# Patient Record
Sex: Female | Born: 1990 | Race: Black or African American | Hispanic: No | Marital: Single | Smoking: Former smoker
Health system: Southern US, Community
[De-identification: ages and names within clinical notes are randomized; demographics above are authoritative.]

## PROBLEM LIST (undated history)

## (undated) ENCOUNTER — Inpatient Hospital Stay (HOSPITAL_COMMUNITY): Payer: Self-pay

## (undated) DIAGNOSIS — N739 Female pelvic inflammatory disease, unspecified: Secondary | ICD-10-CM

## (undated) DIAGNOSIS — L309 Dermatitis, unspecified: Secondary | ICD-10-CM

## (undated) DIAGNOSIS — B009 Herpesviral infection, unspecified: Secondary | ICD-10-CM

## (undated) DIAGNOSIS — T7840XA Allergy, unspecified, initial encounter: Secondary | ICD-10-CM

## (undated) DIAGNOSIS — F32A Depression, unspecified: Secondary | ICD-10-CM

## (undated) DIAGNOSIS — D649 Anemia, unspecified: Secondary | ICD-10-CM

## (undated) DIAGNOSIS — Z8619 Personal history of other infectious and parasitic diseases: Secondary | ICD-10-CM

## (undated) DIAGNOSIS — F419 Anxiety disorder, unspecified: Secondary | ICD-10-CM

## (undated) DIAGNOSIS — F329 Major depressive disorder, single episode, unspecified: Secondary | ICD-10-CM

## (undated) HISTORY — DX: Herpesviral infection, unspecified: B00.9

## (undated) HISTORY — DX: Personal history of other infectious and parasitic diseases: Z86.19

## (undated) HISTORY — DX: Allergy, unspecified, initial encounter: T78.40XA

---
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
## 2011-01-16 ENCOUNTER — Ambulatory Visit (INDEPENDENT_AMBULATORY_CARE_PROVIDER_SITE_OTHER): Payer: Private Health Insurance - Indemnity | Admitting: Gynecology

## 2011-01-16 DIAGNOSIS — B373 Candidiasis of vulva and vagina: Secondary | ICD-10-CM

## 2011-01-16 DIAGNOSIS — R82998 Other abnormal findings in urine: Secondary | ICD-10-CM

## 2011-01-16 DIAGNOSIS — N898 Other specified noninflammatory disorders of vagina: Secondary | ICD-10-CM

## 2011-01-16 DIAGNOSIS — Z113 Encounter for screening for infections with a predominantly sexual mode of transmission: Secondary | ICD-10-CM

## 2011-01-16 DIAGNOSIS — B3731 Acute candidiasis of vulva and vagina: Secondary | ICD-10-CM

## 2011-02-18 ENCOUNTER — Encounter (INDEPENDENT_AMBULATORY_CARE_PROVIDER_SITE_OTHER): Payer: Private Health Insurance - Indemnity | Admitting: Gynecology

## 2011-02-18 ENCOUNTER — Encounter: Payer: Self-pay | Admitting: Gynecology

## 2011-02-18 DIAGNOSIS — B373 Candidiasis of vulva and vagina: Secondary | ICD-10-CM

## 2011-02-18 DIAGNOSIS — Z01419 Encounter for gynecological examination (general) (routine) without abnormal findings: Secondary | ICD-10-CM

## 2011-02-18 DIAGNOSIS — Z113 Encounter for screening for infections with a predominantly sexual mode of transmission: Secondary | ICD-10-CM

## 2011-02-18 DIAGNOSIS — B3731 Acute candidiasis of vulva and vagina: Secondary | ICD-10-CM

## 2011-02-27 LAB — DIFFERENTIAL
Basophils Absolute: 0.1 10*3/uL (ref 0.0–0.1)
Basophils Relative: 0 % (ref 0–1)
Eosinophils Absolute: 0.2 10*3/uL (ref 0.0–0.7)
Eosinophils Relative: 2 % (ref 0–5)
Lymphocytes Relative: 24 % (ref 12–46)
Lymphs Abs: 3.4 10*3/uL (ref 0.7–4.0)
Monocytes Absolute: 0.6 10*3/uL (ref 0.1–1.0)
Monocytes Relative: 4 % (ref 3–12)
Neutro Abs: 10.2 10*3/uL — ABNORMAL HIGH (ref 1.7–7.7)
Neutrophils Relative %: 70 % (ref 43–77)

## 2011-02-27 LAB — URINALYSIS, ROUTINE W REFLEX MICROSCOPIC
Bilirubin Urine: NEGATIVE
Hgb urine dipstick: NEGATIVE
Nitrite: NEGATIVE
Protein, ur: NEGATIVE mg/dL
Specific Gravity, Urine: 1.033 — ABNORMAL HIGH (ref 1.005–1.030)
pH: 8 (ref 5.0–8.0)

## 2011-02-27 LAB — COMPREHENSIVE METABOLIC PANEL
AST: 20 U/L (ref 0–37)
BUN: 10 mg/dL (ref 6–23)
CO2: 25 mEq/L (ref 19–32)
Calcium: 9.1 mg/dL (ref 8.4–10.5)
Chloride: 105 mEq/L (ref 96–112)
Creatinine, Ser: 0.74 mg/dL (ref 0.4–1.2)
GFR calc Af Amer: >60 mL/min (ref >60)
GFR calc non Af Amer: >60 mL/min (ref >60)
Glucose, Bld: 90 mg/dL (ref 70–99)
Total Bilirubin: 1 mg/dL (ref 0.3–1.2)

## 2011-02-27 LAB — URINE MICROSCOPIC-ADD ON

## 2011-02-27 LAB — CBC
HCT: 37.1 % (ref 36.0–46.0)
Hemoglobin: 12.6 g/dL (ref 12.0–15.0)
MCH: 30.7 pg (ref 26.0–34.0)
MCHC: 34 g/dL (ref 30.0–36.0)
MCV: 90.3 fL (ref 78.0–100.0)
Platelets: 294 10*3/uL (ref 150–400)
RBC: 4.11 MIL/uL (ref 3.87–5.11)
RDW: 13.1 % (ref 11.5–15.5)
WBC: 14.5 10*3/uL — ABNORMAL HIGH (ref 4.0–10.5)

## 2011-02-27 LAB — GC/CHLAMYDIA PROBE AMP, GENITAL
Chlamydia, DNA Probe: NEGATIVE
GC Probe Amp, Genital: POSITIVE — AB

## 2011-02-27 LAB — POCT PREGNANCY, URINE: Preg Test, Ur: NEGATIVE

## 2011-02-27 LAB — WET PREP, GENITAL: Clue Cells Wet Prep HPF POC: NONE SEEN

## 2011-02-27 LAB — LIPASE, BLOOD: Lipase: 27 U/L (ref 11–59)

## 2011-02-28 LAB — PREGNANCY, URINE: Preg Test, Ur: NEGATIVE

## 2011-03-15 ENCOUNTER — Ambulatory Visit (INDEPENDENT_AMBULATORY_CARE_PROVIDER_SITE_OTHER): Payer: Private Health Insurance - Indemnity | Admitting: Gynecology

## 2011-03-15 DIAGNOSIS — Z113 Encounter for screening for infections with a predominantly sexual mode of transmission: Secondary | ICD-10-CM

## 2011-03-22 LAB — URINE CULTURE: Colony Count: 1000

## 2011-03-22 LAB — WET PREP, GENITAL
Trich, Wet Prep: NONE SEEN
WBC, Wet Prep HPF POC: NONE SEEN
Yeast Wet Prep HPF POC: NONE SEEN

## 2011-03-22 LAB — POCT PREGNANCY, URINE: Preg Test, Ur: NEGATIVE

## 2011-03-22 LAB — URINALYSIS, ROUTINE W REFLEX MICROSCOPIC
Bilirubin Urine: NEGATIVE
Glucose, UA: NEGATIVE mg/dL
Hgb urine dipstick: NEGATIVE
Ketones, ur: NEGATIVE mg/dL
Nitrite: NEGATIVE
Protein, ur: NEGATIVE mg/dL
Specific Gravity, Urine: 1.019 (ref 1.005–1.030)
Urobilinogen, UA: 0.2 mg/dL (ref 0.0–1.0)
pH: 5 (ref 5.0–8.0)

## 2011-03-22 LAB — GC/CHLAMYDIA PROBE AMP, GENITAL
Chlamydia, DNA Probe: NEGATIVE
GC Probe Amp, Genital: NEGATIVE

## 2011-03-23 LAB — URINALYSIS, ROUTINE W REFLEX MICROSCOPIC
Glucose, UA: NEGATIVE mg/dL
Nitrite: NEGATIVE
Protein, ur: NEGATIVE mg/dL
pH: 6 (ref 5.0–8.0)

## 2011-03-23 LAB — URINE CULTURE: Colony Count: NO GROWTH

## 2011-03-23 LAB — POCT PREGNANCY, URINE: Preg Test, Ur: NEGATIVE

## 2011-03-23 LAB — URINE MICROSCOPIC-ADD ON

## 2011-03-23 LAB — WET PREP, GENITAL
Trich, Wet Prep: NONE SEEN
Yeast Wet Prep HPF POC: NONE SEEN

## 2011-03-23 LAB — GC/CHLAMYDIA PROBE AMP, GENITAL: GC Probe Amp, Genital: NEGATIVE

## 2011-03-30 ENCOUNTER — Emergency Department (HOSPITAL_COMMUNITY)
Admission: EM | Admit: 2011-03-30 | Discharge: 2011-03-31 | Disposition: A | Discharge status: Home / Self Care | Payer: Private Health Insurance - Indemnity | Attending: Emergency Medicine | Admitting: Emergency Medicine

## 2011-03-30 DIAGNOSIS — R109 Unspecified abdominal pain: Secondary | ICD-10-CM | POA: Insufficient documentation

## 2011-03-30 DIAGNOSIS — N949 Unspecified condition associated with female genital organs and menstrual cycle: Secondary | ICD-10-CM | POA: Insufficient documentation

## 2011-03-30 DIAGNOSIS — N83209 Unspecified ovarian cyst, unspecified side: Secondary | ICD-10-CM | POA: Insufficient documentation

## 2011-03-30 DIAGNOSIS — R110 Nausea: Secondary | ICD-10-CM | POA: Insufficient documentation

## 2011-03-30 DIAGNOSIS — J45909 Unspecified asthma, uncomplicated: Secondary | ICD-10-CM | POA: Insufficient documentation

## 2011-03-30 DIAGNOSIS — K5900 Constipation, unspecified: Secondary | ICD-10-CM | POA: Insufficient documentation

## 2011-03-31 ENCOUNTER — Emergency Department (HOSPITAL_COMMUNITY): Payer: Private Health Insurance - Indemnity

## 2011-03-31 LAB — URINALYSIS, ROUTINE W REFLEX MICROSCOPIC
Glucose, UA: NEGATIVE mg/dL
Hgb urine dipstick: NEGATIVE
Protein, ur: NEGATIVE mg/dL
Specific Gravity, Urine: 1.022 (ref 1.005–1.030)
pH: 5.5 (ref 5.0–8.0)

## 2011-03-31 LAB — WET PREP, GENITAL
Trich, Wet Prep: NONE SEEN
Yeast Wet Prep HPF POC: NONE SEEN

## 2011-04-01 LAB — COMPREHENSIVE METABOLIC PANEL
Alkaline Phosphatase: 108 U/L (ref 47–119)
BUN: 7 mg/dL (ref 6–23)
Chloride: 101 mEq/L (ref 96–112)
Creatinine, Ser: 0.84 mg/dL (ref 0.4–1.2)
Glucose, Bld: 88 mg/dL (ref 70–99)
Potassium: 2.9 mEq/L — ABNORMAL LOW (ref 3.5–5.1)
Total Bilirubin: 1 mg/dL (ref 0.3–1.2)
Total Protein: 7.8 g/dL (ref 6.0–8.3)

## 2011-04-01 LAB — DIFFERENTIAL
Basophils Absolute: 0.1 10*3/uL (ref 0.0–0.1)
Basophils Relative: 1 % (ref 0–1)
Lymphocytes Relative: 15 % — ABNORMAL LOW (ref 24–48)
Neutro Abs: 9.2 10*3/uL — ABNORMAL HIGH (ref 1.7–8.0)
Neutrophils Relative %: 77 % — ABNORMAL HIGH (ref 43–71)

## 2011-04-01 LAB — CBC
HCT: 38.1 % (ref 36.0–49.0)
Hemoglobin: 12.6 g/dL (ref 12.0–16.0)
MCV: 92.5 fL (ref 78.0–98.0)
Platelets: 388 10*3/uL (ref 150–400)
RDW: 13.2 % (ref 11.4–15.5)

## 2011-04-01 LAB — URINE CULTURE

## 2011-04-01 LAB — URINE MICROSCOPIC-ADD ON

## 2011-04-01 LAB — URINALYSIS, ROUTINE W REFLEX MICROSCOPIC
Glucose, UA: NEGATIVE mg/dL
Hgb urine dipstick: NEGATIVE
Ketones, ur: 15 mg/dL — AB
Protein, ur: 30 mg/dL — AB
pH: 5.5 (ref 5.0–8.0)

## 2011-04-01 LAB — GC/CHLAMYDIA PROBE AMP, GENITAL: GC Probe Amp, Genital: NEGATIVE

## 2011-04-01 LAB — LIPASE, BLOOD: Lipase: 27 U/L (ref 11–59)

## 2011-04-25 ENCOUNTER — Emergency Department (HOSPITAL_COMMUNITY)
Admission: EM | Admit: 2011-04-25 | Discharge: 2011-04-26 | Disposition: A | Discharge status: Home / Self Care | Payer: Private Health Insurance - Indemnity | Attending: Emergency Medicine | Admitting: Emergency Medicine

## 2011-04-25 DIAGNOSIS — N760 Acute vaginitis: Secondary | ICD-10-CM | POA: Insufficient documentation

## 2011-04-25 DIAGNOSIS — A499 Bacterial infection, unspecified: Secondary | ICD-10-CM | POA: Insufficient documentation

## 2011-04-25 DIAGNOSIS — J45909 Unspecified asthma, uncomplicated: Secondary | ICD-10-CM | POA: Insufficient documentation

## 2011-04-25 DIAGNOSIS — B9689 Other specified bacterial agents as the cause of diseases classified elsewhere: Secondary | ICD-10-CM | POA: Insufficient documentation

## 2011-04-25 DIAGNOSIS — N949 Unspecified condition associated with female genital organs and menstrual cycle: Secondary | ICD-10-CM | POA: Insufficient documentation

## 2011-04-25 DIAGNOSIS — R109 Unspecified abdominal pain: Secondary | ICD-10-CM | POA: Insufficient documentation

## 2011-04-25 LAB — URINALYSIS, ROUTINE W REFLEX MICROSCOPIC
Ketones, ur: NEGATIVE mg/dL
Nitrite: NEGATIVE
Protein, ur: NEGATIVE mg/dL
Urobilinogen, UA: 0.2 mg/dL (ref 0.0–1.0)

## 2011-04-25 LAB — POCT PREGNANCY, URINE: Preg Test, Ur: NEGATIVE

## 2011-04-26 LAB — GC/CHLAMYDIA PROBE AMP, GENITAL
Chlamydia, DNA Probe: NEGATIVE
GC Probe Amp, Genital: NEGATIVE

## 2011-04-26 LAB — POCT I-STAT, CHEM 8
BUN: 14 mg/dL (ref 6–23)
Calcium, Ion: 1.14 mmol/L (ref 1.12–1.32)
HCT: 36 % (ref 36.0–46.0)
Hemoglobin: 12.2 g/dL (ref 12.0–15.0)
TCO2: 24 mmol/L (ref 0–100)

## 2011-04-26 LAB — WET PREP, GENITAL: Yeast Wet Prep HPF POC: NONE SEEN

## 2011-06-04 ENCOUNTER — Ambulatory Visit: Payer: Private Health Insurance - Indemnity | Admitting: Gynecology

## 2011-07-21 ENCOUNTER — Emergency Department (HOSPITAL_COMMUNITY)
Admission: EM | Admit: 2011-07-21 | Discharge: 2011-07-21 | Disposition: A | Discharge status: Home / Self Care | Payer: Private Health Insurance - Indemnity | Attending: Emergency Medicine | Admitting: Emergency Medicine

## 2011-07-21 DIAGNOSIS — R0989 Other specified symptoms and signs involving the circulatory and respiratory systems: Secondary | ICD-10-CM | POA: Insufficient documentation

## 2011-07-21 DIAGNOSIS — R109 Unspecified abdominal pain: Secondary | ICD-10-CM | POA: Insufficient documentation

## 2011-07-21 DIAGNOSIS — R062 Wheezing: Secondary | ICD-10-CM | POA: Insufficient documentation

## 2011-07-21 DIAGNOSIS — R0609 Other forms of dyspnea: Secondary | ICD-10-CM | POA: Insufficient documentation

## 2011-07-21 LAB — URINALYSIS, ROUTINE W REFLEX MICROSCOPIC
Leukocytes, UA: NEGATIVE
Nitrite: NEGATIVE
Specific Gravity, Urine: 1.022 (ref 1.005–1.030)
pH: 7 (ref 5.0–8.0)

## 2011-07-21 LAB — DIFFERENTIAL
Basophils Relative: 1 % (ref 0–1)
Eosinophils Absolute: 0.2 10*3/uL (ref 0.0–0.7)
Eosinophils Relative: 3 % (ref 0–5)
Monocytes Relative: 5 % (ref 3–12)
Neutrophils Relative %: 45 % (ref 43–77)

## 2011-07-21 LAB — POCT PREGNANCY, URINE: Preg Test, Ur: NEGATIVE

## 2011-07-21 LAB — COMPREHENSIVE METABOLIC PANEL
AST: 19 U/L (ref 0–37)
Albumin: 3.9 g/dL (ref 3.5–5.2)
Alkaline Phosphatase: 73 U/L (ref 39–117)
Chloride: 105 mEq/L (ref 96–112)
Potassium: 3.5 mEq/L (ref 3.5–5.1)
Total Bilirubin: 0.3 mg/dL (ref 0.3–1.2)

## 2011-07-21 LAB — CBC
Hemoglobin: 12.3 g/dL (ref 12.0–15.0)
RBC: 4.05 MIL/uL (ref 3.87–5.11)
WBC: 6.9 10*3/uL (ref 4.0–10.5)

## 2011-07-22 ENCOUNTER — Encounter: Payer: Self-pay | Admitting: Anesthesiology

## 2011-07-24 ENCOUNTER — Encounter: Payer: Private Health Insurance - Indemnity | Admitting: Gynecology

## 2011-08-06 ENCOUNTER — Emergency Department (HOSPITAL_COMMUNITY)
Admission: EM | Admit: 2011-08-06 | Discharge: 2011-08-07 | Disposition: A | Discharge status: Home / Self Care | Payer: Private Health Insurance - Indemnity | Attending: Emergency Medicine | Admitting: Emergency Medicine

## 2011-08-06 DIAGNOSIS — N898 Other specified noninflammatory disorders of vagina: Secondary | ICD-10-CM | POA: Insufficient documentation

## 2011-08-06 DIAGNOSIS — R1031 Right lower quadrant pain: Secondary | ICD-10-CM | POA: Insufficient documentation

## 2011-08-06 DIAGNOSIS — M545 Low back pain, unspecified: Secondary | ICD-10-CM | POA: Insufficient documentation

## 2011-08-06 LAB — URINALYSIS, ROUTINE W REFLEX MICROSCOPIC
Bilirubin Urine: NEGATIVE
Hgb urine dipstick: NEGATIVE
Nitrite: NEGATIVE
Specific Gravity, Urine: 1.02 (ref 1.005–1.030)
pH: 7.5 (ref 5.0–8.0)

## 2011-08-06 LAB — BASIC METABOLIC PANEL
BUN: 10 mg/dL (ref 6–23)
Chloride: 105 mEq/L (ref 96–112)
GFR calc Af Amer: >60 mL/min (ref >60)
Potassium: 3.3 mEq/L — ABNORMAL LOW (ref 3.5–5.1)
Sodium: 139 mEq/L (ref 135–145)

## 2011-08-06 LAB — POCT PREGNANCY, URINE: Preg Test, Ur: NEGATIVE

## 2011-08-07 LAB — CBC
MCHC: 34 g/dL (ref 30.0–36.0)
Platelets: 343 10*3/uL (ref 150–400)
RDW: 12.6 % (ref 11.5–15.5)
WBC: 10.5 10*3/uL (ref 4.0–10.5)

## 2011-08-07 LAB — DIFFERENTIAL
Basophils Absolute: 0 10*3/uL (ref 0.0–0.1)
Basophils Relative: 0 % (ref 0–1)
Eosinophils Absolute: 0.3 10*3/uL (ref 0.0–0.7)
Eosinophils Relative: 3 % (ref 0–5)
Lymphocytes Relative: 37 % (ref 12–46)
Monocytes Absolute: 0.5 10*3/uL (ref 0.1–1.0)

## 2011-08-07 LAB — WET PREP, GENITAL
Trich, Wet Prep: NONE SEEN
Yeast Wet Prep HPF POC: NONE SEEN

## 2011-08-08 LAB — GC/CHLAMYDIA PROBE AMP, GENITAL
Chlamydia, DNA Probe: NEGATIVE
GC Probe Amp, Genital: NEGATIVE

## 2011-09-17 LAB — RAPID STREP SCREEN (MED CTR MEBANE ONLY): Streptococcus, Group A Screen (Direct): NEGATIVE

## 2011-12-26 ENCOUNTER — Emergency Department (HOSPITAL_COMMUNITY)
Admission: EM | Admit: 2011-12-26 | Discharge: 2011-12-27 | Disposition: A | Discharge status: Home / Self Care | Payer: Private Health Insurance - Indemnity | Attending: Emergency Medicine | Admitting: Emergency Medicine

## 2011-12-26 ENCOUNTER — Encounter (HOSPITAL_COMMUNITY): Payer: Self-pay | Admitting: *Deleted

## 2011-12-26 DIAGNOSIS — R0609 Other forms of dyspnea: Secondary | ICD-10-CM | POA: Insufficient documentation

## 2011-12-26 DIAGNOSIS — J45901 Unspecified asthma with (acute) exacerbation: Secondary | ICD-10-CM

## 2011-12-26 DIAGNOSIS — R0989 Other specified symptoms and signs involving the circulatory and respiratory systems: Secondary | ICD-10-CM | POA: Insufficient documentation

## 2011-12-26 DIAGNOSIS — A499 Bacterial infection, unspecified: Secondary | ICD-10-CM | POA: Insufficient documentation

## 2011-12-26 DIAGNOSIS — N760 Acute vaginitis: Secondary | ICD-10-CM | POA: Insufficient documentation

## 2011-12-26 DIAGNOSIS — B9689 Other specified bacterial agents as the cause of diseases classified elsewhere: Secondary | ICD-10-CM | POA: Insufficient documentation

## 2011-12-26 MED ORDER — IPRATROPIUM BROMIDE 0.02 % IN SOLN
RESPIRATORY_TRACT | Status: AC
  Filled 2011-12-26: qty 2.5

## 2011-12-26 MED ORDER — ALBUTEROL SULFATE (5 MG/ML) 0.5% IN NEBU
2.5000 mg | INHALATION_SOLUTION | Freq: Once | RESPIRATORY_TRACT | Status: AC
  Administered 2011-12-26: 5 mg via RESPIRATORY_TRACT

## 2011-12-26 MED ORDER — IPRATROPIUM BROMIDE 0.02 % IN SOLN
0.5000 mg | Freq: Once | RESPIRATORY_TRACT | Status: AC
  Administered 2011-12-26: 0.5 mg via RESPIRATORY_TRACT

## 2011-12-26 MED ORDER — ALBUTEROL SULFATE (5 MG/ML) 0.5% IN NEBU
INHALATION_SOLUTION | RESPIRATORY_TRACT | Status: AC
  Filled 2011-12-26: qty 1

## 2011-12-26 NOTE — ED Notes (Signed)
The pt has had difficulty breathing since yesterday.  She has not had a hhn today.

## 2011-12-27 LAB — URINALYSIS, ROUTINE W REFLEX MICROSCOPIC
Bilirubin Urine: NEGATIVE
Hgb urine dipstick: NEGATIVE
Ketones, ur: NEGATIVE mg/dL
Nitrite: NEGATIVE
Urobilinogen, UA: 0.2 mg/dL (ref 0.0–1.0)

## 2011-12-27 LAB — POCT PREGNANCY, URINE: Preg Test, Ur: NEGATIVE

## 2011-12-27 LAB — WET PREP, GENITAL: Trich, Wet Prep: NONE SEEN

## 2011-12-27 MED ORDER — METRONIDAZOLE 500 MG PO TABS: 500.0000 mg | ORAL_TABLET | Freq: Two times a day (BID) | ORAL | Status: AC

## 2011-12-27 MED ORDER — ALBUTEROL SULFATE HFA 108 (90 BASE) MCG/ACT IN AERS: 1.0000 | INHALATION_SPRAY | Freq: Four times a day (QID) | RESPIRATORY_TRACT | Status: DC | PRN

## 2011-12-27 NOTE — ED Provider Notes (Addendum)
History     CSN: [id number]  Arrival date & time 12/26/11  2156   First MD Initiated Contact with Patient 12/26/11 2351      Chief Complaint  Patient presents with  . Shortness of Breath    (Consider location/radiation/quality/duration/timing/severity/associated sxs/prior treatment) Patient is a 21 y.o. female presenting with wheezing and vaginal discharge. The history is provided by the patient. No language interpreter was used.  Wheezing  The current episode started today. The onset was sudden. The problem occurs continuously. The problem has been resolved. The problem is moderate. The symptoms are relieved by beta-agonist inhalers. The symptoms are aggravated by nothing. Associated symptoms include wheezing. Pertinent negatives include no chest pain and no chest pressure. There was no intake of a foreign body. She has not inhaled smoke recently. She has had intermittent steroid use. She has had no prior hospitalizations. She has had no prior ICU admissions. She has had no prior intubations. Her past medical history is significant for asthma. She has been behaving normally. Urine output has been normal. The last void occurred less than 6 hours ago. There were no sick contacts.  Vaginal Discharge This is a recurrent problem. The current episode started more than 2 days ago. The problem occurs constantly. The problem has not changed since onset.Pertinent negatives include no chest pain. The symptoms are aggravated by nothing. The symptoms are relieved by nothing. She has tried nothing for the symptoms. The treatment provided no relief.   States this is really why she came to the hospital and had asthma attack on the way.    Past Medical History  Diagnosis Date  . History of gonorrhea     12/2008; 09/2010;   . History of chlamydia     12/2008; 03/09/2009  . Asthma     History reviewed. No pertinent past surgical history.  History reviewed. No pertinent family history.  History    Substance Use Topics  . Smoking status: Unknown If Ever Smoked  . Smokeless tobacco: Not on file  . Alcohol Use: No    OB History    Grav Para Term Preterm Abortions TAB SAB Ect Mult Living   0               Review of Systems  Constitutional: Negative for activity change.  HENT: Negative for facial swelling.   Eyes: Negative for discharge.  Respiratory: Positive for wheezing.   Cardiovascular: Negative for chest pain.  Gastrointestinal: Negative for abdominal distention.  Genitourinary: Positive for vaginal discharge. Negative for dysuria and difficulty urinating.  Musculoskeletal: Negative for arthralgias.  Neurological: Negative.   Hematological: Negative.   Psychiatric/Behavioral: Negative.     Allergies  Review of patient's allergies indicates no known allergies.  Home Medications   Current Outpatient Rx  Name Route Sig Dispense Refill  . ALBUTEROL SULFATE (5 MG/ML) 0.5% IN NEBU Nebulization Take 2.5 mg by nebulization every 6 (six) hours as needed. For wheezing      BP 117/58  Pulse 108  Temp(Src) 98.1 F (36.7 C) (Oral)  Resp 22  SpO2 99%  LMP 12/08/2011  Physical Exam  Constitutional: She is oriented to person, place, and time. She appears well-developed and well-nourished.  HENT:  Head: Normocephalic and atraumatic.  Mouth/Throat: Oropharynx is clear and moist. No oropharyngeal exudate.  Eyes: EOM are normal. Pupils are equal, round, and reactive to light.  Neck: Normal range of motion. Neck supple.  Cardiovascular: Normal rate and regular rhythm.   Pulmonary/Chest: Effort normal and  breath sounds normal. She has no wheezes.  Abdominal: Soft. Bowel sounds are normal. There is no tenderness. There is no rebound and no guarding.  Genitourinary: Cervix exhibits discharge. Right adnexum displays no tenderness. Left adnexum displays no tenderness.       Chaperone present  Musculoskeletal: Normal range of motion.  Neurological: She is alert and oriented to  person, place, and time.  Skin: Skin is warm and dry.  Psychiatric: Thought content normal.    ED Course  Procedures (including critical care time)   Labs Reviewed  POCT PREGNANCY, URINE  URINALYSIS, ROUTINE W REFLEX MICROSCOPIC  POCT PREGNANCY, URINE  WET PREP, GENITAL  GC/CHLAMYDIA PROBE AMP, GENITAL   No results found.   No diagnosis found.    MDM  PERC negative Follow up with your regular provider for pap smear return for worsening symptoms.  Patient verbalizes understanding and agrees to follow up        Lauren Conway K Chaia Ikard-Rasch, MD 12/27/11 0135  Lauren Conway K Zakira Ressel-Rasch, MD 12/27/11 0139

## 2011-12-27 NOTE — ED Notes (Signed)
MD and Tech at bedside for pelvic exam

## 2011-12-28 LAB — GC/CHLAMYDIA PROBE AMP, GENITAL
Chlamydia, DNA Probe: NEGATIVE
GC Probe Amp, Genital: NEGATIVE

## 2012-02-09 ENCOUNTER — Encounter (HOSPITAL_COMMUNITY): Payer: Self-pay

## 2012-02-09 ENCOUNTER — Inpatient Hospital Stay (HOSPITAL_COMMUNITY)
Admission: AD | Admit: 2012-02-09 | Discharge: 2012-02-09 | Disposition: A | Discharge status: Home / Self Care | Payer: Private Health Insurance - Indemnity | Source: Ambulatory Visit | Attending: Obstetrics and Gynecology | Admitting: Obstetrics and Gynecology

## 2012-02-09 ENCOUNTER — Inpatient Hospital Stay (HOSPITAL_COMMUNITY): Payer: Private Health Insurance - Indemnity

## 2012-02-09 DIAGNOSIS — N898 Other specified noninflammatory disorders of vagina: Secondary | ICD-10-CM | POA: Insufficient documentation

## 2012-02-09 DIAGNOSIS — J45909 Unspecified asthma, uncomplicated: Secondary | ICD-10-CM | POA: Insufficient documentation

## 2012-02-09 DIAGNOSIS — O26859 Spotting complicating pregnancy, unspecified trimester: Secondary | ICD-10-CM | POA: Insufficient documentation

## 2012-02-09 DIAGNOSIS — O209 Hemorrhage in early pregnancy, unspecified: Secondary | ICD-10-CM

## 2012-02-09 DIAGNOSIS — R109 Unspecified abdominal pain: Secondary | ICD-10-CM | POA: Insufficient documentation

## 2012-02-09 LAB — CBC
Hemoglobin: 11.9 g/dL — ABNORMAL LOW (ref 12.0–15.0)
MCH: 31.2 pg (ref 26.0–34.0)
Platelets: 310 10*3/uL (ref 150–400)
RBC: 3.82 MIL/uL — ABNORMAL LOW (ref 3.87–5.11)
WBC: 8.1 10*3/uL (ref 4.0–10.5)

## 2012-02-09 LAB — URINALYSIS, ROUTINE W REFLEX MICROSCOPIC
Glucose, UA: NEGATIVE mg/dL
Ketones, ur: NEGATIVE mg/dL
Leukocytes, UA: NEGATIVE
Specific Gravity, Urine: >1.03 — ABNORMAL HIGH (ref 1.005–1.030)
pH: 5.5 (ref 5.0–8.0)

## 2012-02-09 LAB — POCT PREGNANCY, URINE: Preg Test, Ur: POSITIVE — AB

## 2012-02-09 LAB — URINE MICROSCOPIC-ADD ON

## 2012-02-09 LAB — WET PREP, GENITAL
Clue Cells Wet Prep HPF POC: NONE SEEN
Trich, Wet Prep: NONE SEEN
Yeast Wet Prep HPF POC: NONE SEEN

## 2012-02-09 NOTE — Discharge Instructions (Signed)
Vaginal Bleeding During Pregnancy, First Trimester A small amount of bleeding (spotting) is relatively common in early pregnancy. It usually stops on its own. There are many causes for bleeding or spotting in early pregnancy. Some bleeding may be related to the pregnancy and some may not. Cramping with the bleeding is more serious and concerning. Tell your caregiver if you have any vaginal bleeding.  CAUSES   It is normal in most cases.   The pregnancy ends (miscarriage).   The pregnancy may end (threatened miscarriage).   Infection or inflammation of the cervix.   Growths (polyps) on the cervix.   Pregnancy happens outside of the uterus and in a fallopian tube (tubal pregnancy).   Many tiny cysts in the uterus instead of pregnancy tissue (molar pregnancy).  SYMPTOMS  Vaginal bleeding or spotting with or without cramps. DIAGNOSIS  To evaluate the pregnancy, your caregiver may:  Do a pelvic exam.   Take blood tests.   Do an ultrasound.  It is very important to follow your caregiver's instructions.  TREATMENT   Evaluation of the pregnancy with blood tests and ultrasound.   Bed rest (getting up to use the bathroom only).   Rho-gam immunization if the mother is Rh negative and the father is Rh positive.  HOME CARE INSTRUCTIONS   If your caregiver orders bed rest, you may need to make arrangements for the care of other children and for other responsibilities. However, your caregiver may allow you to continue light activity.   Keep track of the number of pads you use each day, how often you change pads and how soaked (saturated) they are. Write this down.   Do not use tampons. Do not douche.   Do not have sexual intercourse or orgasms until approved by your physician.   Save any tissue that you pass for your caregiver to see.   Take medicine for cramps only with your caregiver's permission.   Do not take aspirin because it can make you bleed.  SEEK IMMEDIATE MEDICAL CARE  IF:   You experience severe cramps in your stomach, back or belly (abdomen).   You have an oral temperature above 102 F (38.9 C), not controlled by medicine.   You pass large clots or tissue.   Your bleeding increases or you become light-headed, weak or have fainting episodes.   You develop chills.   You are leaking or have a gush of fluid from your vagina.   You pass out while having a bowel movement. That may mean you have a ruptured tubal pregnancy.  Document Released: 09/11/2005 Document Revised: 08/14/2011 Document Reviewed: 03/23/2009 [employer] Patient Information 2012 [employer]. 

## 2012-02-09 NOTE — ED Provider Notes (Signed)
History   Pt presents today c/o vag spotting that began yesterday. She has also had some mild lower abd cramping. Her last episode of intercourse was on 02/05/12. She denies dysuria, vag irritation, or any other sx at this time.  Chief Complaint  Patient presents with  . Vaginal Bleeding   HPI  OB History    Grav Para Term Preterm Abortions TAB SAB Ect Mult Living   1               Past Medical History  Diagnosis Date  . History of gonorrhea     12/2008; 09/2010;   . History of chlamydia     12/2008; 03/09/2009  . Asthma     History reviewed. No pertinent past surgical history.  History reviewed. No pertinent family history.  History  Substance Use Topics  . Smoking status: Never Smoker   . Smokeless tobacco: Not on file  . Alcohol Use: No    Allergies: No Known Allergies  Prescriptions prior to admission  Medication Sig Dispense Refill  . albuterol (PROVENTIL HFA;VENTOLIN HFA) 108 (90 BASE) MCG/ACT inhaler Inhale 1-2 puffs into the lungs every 6 (six) hours as needed for wheezing.  1 Inhaler  0  . Prenatal Vit-Fe Fumarate-FA (PRENATAL MULTIVITAMIN) TABS Take 1 tablet by mouth daily.        Review of Systems  Constitutional: Negative for fever and chills.  Eyes: Negative for blurred vision and double vision.  Respiratory: Negative for cough, hemoptysis, sputum production, shortness of breath and wheezing.   Cardiovascular: Negative for chest pain and palpitations.  Gastrointestinal: Positive for abdominal pain. Negative for nausea, vomiting, diarrhea and constipation.  Genitourinary: Negative for dysuria, urgency, frequency and hematuria.  Neurological: Negative for dizziness and headaches.  Psychiatric/Behavioral: Negative for depression and suicidal ideas.   Physical Exam   Blood pressure 115/55, pulse 92, temperature 97.3 F (36.3 C), temperature source Oral, resp. rate 16, height 5\' 3"  (1.6 m), weight 146 lb 6 oz (66.395 kg), last menstrual period  01/03/2012.  Physical Exam  Nursing note and vitals reviewed. Constitutional: She is oriented to person, place, and time. She appears well-developed and well-nourished. No distress.  HENT:  Head: Normocephalic and atraumatic.  Eyes: EOM are normal. Pupils are equal, round, and reactive to light.  GI: Soft. She exhibits no distension and no mass. There is no tenderness. There is no rebound and no guarding.  Genitourinary: There is bleeding around the vagina. Vaginal discharge found.       Minimal amount of bright red blood in vag vault.  Cervix Lg/closed. No adnexal masses.  Neurological: She is alert and oriented to person, place, and time.  Skin: Skin is warm and dry. She is not diaphoretic.  Psychiatric: She has a normal mood and affect. Her behavior is normal. Judgment and thought content normal.    MAU Course  Procedures  Wet prep and GC/Chlamydia cultures done.  Results for orders placed during the hospital encounter of 02/09/12 (from the past 24 hour(s))  URINALYSIS, ROUTINE W REFLEX MICROSCOPIC     Status: Abnormal   Collection Time   02/09/12  5:35 AM      Component Value Range   Color, Urine YELLOW  YELLOW    APPearance CLEAR  CLEAR    Specific Gravity, Urine >1.030 (*) 1.005 - 1.030    pH 5.5  5.0 - 8.0    Glucose, UA NEGATIVE  NEGATIVE (mg/dL)   Hgb urine dipstick MODERATE (*) NEGATIVE  Bilirubin Urine NEGATIVE  NEGATIVE    Ketones, ur NEGATIVE  NEGATIVE (mg/dL)   Protein, ur NEGATIVE  NEGATIVE (mg/dL)   Urobilinogen, UA 0.2  0.0 - 1.0 (mg/dL)   Nitrite NEGATIVE  NEGATIVE    Leukocytes, UA NEGATIVE  NEGATIVE   URINE MICROSCOPIC-ADD ON     Status: Abnormal   Collection Time   02/09/12  5:35 AM      Component Value Range   Squamous Epithelial / LPF FEW (*) RARE    WBC, UA 0-2  <3 (WBC/hpf)   RBC / HPF 3-6  <3 (RBC/hpf)   Bacteria, UA FEW (*) RARE    Urine-Other MUCOUS PRESENT    POCT PREGNANCY, URINE     Status: Abnormal   Collection Time   02/09/12  5:39 AM        Component Value Range   Preg Test, Ur POSITIVE (*) NEGATIVE   CBC     Status: Abnormal   Collection Time   02/09/12  5:42 AM      Component Value Range   WBC 8.1  4.0 - 10.5 (K/uL)   RBC 3.82 (*) 3.87 - 5.11 (MIL/uL)   Hemoglobin 11.9 (*) 12.0 - 15.0 (g/dL)   HCT 47.8 (*) 29.5 - 46.0 (%)   MCV 92.7  78.0 - 100.0 (fL)   MCH 31.2  26.0 - 34.0 (pg)   MCHC 33.6  30.0 - 36.0 (g/dL)   RDW 62.1  30.8 - 65.7 (%)   Platelets 310  150 - 400 (K/uL)  ABO/RH     Status: Normal (Preliminary result)   Collection Time   02/09/12  5:42 AM      Component Value Range   ABO/RH(D) O POS    HCG, QUANTITATIVE, PREGNANCY     Status: Abnormal   Collection Time   02/09/12  5:42 AM      Component Value Range   hCG, Beta Chain, Quant, S 1094 (*) <5 (mIU/mL)    US Ob Comp Less 14 Wks  02/09/2012  *RADIOLOGY REPORT*  Clinical Data: bleeding; ;  OBSTETRIC <14 WK [place] AND TRANSVAGINAL OB US  Technique: Both transabdominal and transvaginal ultrasound examinations were performed for complete evaluation of the gestation as well as the maternal uterus, adnexal regions, and pelvic cul-de-sac.  Comparison: None.  Findings: No intrauterine gestation is visualized.  The endometrium is 9 mm in thickness.  Ovaries are symmetric in size and echotexture.  Right corpus luteal cyst.  No adnexal masses.  Trace free fluid.  IMPRESSION: No intrauterine pregnancy visualized.  Recommend correlation with beta HCG level.  Consider serial quantitative beta HCG levels and ultrasounds as indicated.  Original Report Authenticated By: Cyndie Chime, M.D.   US Ob Transvaginal  02/09/2012  *RADIOLOGY REPORT*  Clinical Data: bleeding; ;  OBSTETRIC <14 WK [place] AND TRANSVAGINAL OB US  Technique: Both transabdominal and transvaginal ultrasound examinations were performed for complete evaluation of the gestation as well as the maternal uterus, adnexal regions, and pelvic cul-de-sac.  Comparison: None.  Findings: No intrauterine gestation is  visualized.  The endometrium is 9 mm in thickness.  Ovaries are symmetric in size and echotexture.  Right corpus luteal cyst.  No adnexal masses.  Trace free fluid.  IMPRESSION: No intrauterine pregnancy visualized.  Recommend correlation with beta HCG level.  Consider serial quantitative beta HCG levels and ultrasounds as indicated.  Original Report Authenticated By: Cyndie Chime, M.D.    Assessment and Plan  Bleeding in preg: discussed with  pt at length. She will return on 02/11/12 for repeat B-quant. Discussed diet, activity, risks, and precautions.  Clinton Gallant. Klein Willcox III, DrHSc, MPAS, PA-C  02/09/2012, 6:06 AM   Henrietta Hoover, PA 02/09/12 [phone]

## 2012-02-09 NOTE — Progress Notes (Signed)
Patient is here with c/o dark red vaginal spotting since last night. She also c/o intermittent rlq pain that she can't recall its onset. She does not have a pad or pantiliner on at this time.

## 2012-02-10 LAB — GC/CHLAMYDIA PROBE AMP, GENITAL: GC Probe Amp, Genital: NEGATIVE

## 2012-02-10 NOTE — ED Provider Notes (Signed)
Agree with above note.  Lauren Conway 02/10/2012 9:00 AM   

## 2012-02-11 ENCOUNTER — Inpatient Hospital Stay (HOSPITAL_COMMUNITY)
Admission: AD | Admit: 2012-02-11 | Discharge: 2012-02-11 | Disposition: A | Discharge status: Home / Self Care | Payer: Medicaid Other | Source: Ambulatory Visit | Attending: Family Medicine | Admitting: Family Medicine

## 2012-02-11 DIAGNOSIS — O209 Hemorrhage in early pregnancy, unspecified: Secondary | ICD-10-CM

## 2012-02-11 DIAGNOSIS — O26859 Spotting complicating pregnancy, unspecified trimester: Secondary | ICD-10-CM | POA: Insufficient documentation

## 2012-02-11 LAB — HCG, QUANTITATIVE, PREGNANCY: hCG, Beta Chain, Quant, S: 1674 m[IU]/mL — ABNORMAL HIGH (ref <5)

## 2012-02-11 NOTE — ED Provider Notes (Signed)
Chart reviewed and agree with management and plan.  

## 2012-02-11 NOTE — ED Provider Notes (Signed)
History   Pt presents today for repeat B-quant secondary to vag spotting. She states she is doing well and she denies any pain.   No chief complaint on file.  HPI  OB History    Grav Para Term Preterm Abortions TAB SAB Ect Mult Living   1               Past Medical History  Diagnosis Date  . History of gonorrhea     12/2008; 09/2010;   . History of chlamydia     12/2008; 03/09/2009  . Asthma     No past surgical history on file.  No family history on file.  History  Substance Use Topics  . Smoking status: Never Smoker   . Smokeless tobacco: Not on file  . Alcohol Use: No    Allergies: No Known Allergies  Prescriptions prior to admission  Medication Sig Dispense Refill  . albuterol (PROVENTIL HFA;VENTOLIN HFA) 108 (90 BASE) MCG/ACT inhaler Inhale 1-2 puffs into the lungs every 6 (six) hours as needed for wheezing.  1 Inhaler  0  . Prenatal Vit-Fe Fumarate-FA (PRENATAL MULTIVITAMIN) TABS Take 1 tablet by mouth daily.        Review of Systems  Constitutional: Negative for fever and chills.  Eyes: Negative for blurred vision and double vision.  Respiratory: Negative for cough, hemoptysis, sputum production, shortness of breath and wheezing.   Cardiovascular: Negative for chest pain and palpitations.  Gastrointestinal: Negative for nausea, vomiting, abdominal pain, diarrhea and constipation.  Genitourinary: Negative for dysuria, urgency, frequency and hematuria.  Neurological: Negative for dizziness and headaches.  Psychiatric/Behavioral: Negative for depression and suicidal ideas.   Physical Exam   Blood pressure 114/59, pulse 81, temperature 98.8 F (37.1 C), temperature source Oral, resp. rate 16, last menstrual period 01/03/2012.  Physical Exam  Nursing note and vitals reviewed. Constitutional: She is oriented to person, place, and time. She appears well-developed and well-nourished. No distress.  HENT:  Head: Normocephalic and atraumatic.  Eyes: EOM are  normal. Pupils are equal, round, and reactive to light.  GI: Soft. She exhibits no distension. There is no tenderness. There is no rebound and no guarding.  Neurological: She is alert and oriented to person, place, and time.  Skin: Skin is warm and dry. She is not diaphoretic.  Psychiatric: She has a normal mood and affect. Her behavior is normal. Judgment and thought content normal.    MAU Course  Procedures  Results for orders placed during the hospital encounter of 02/11/12 (from the past 72 hour(s))  HCG, QUANTITATIVE, PREGNANCY     Status: Abnormal   Collection Time   02/11/12  4:18 PM      Component Value Range Comment   hCG, Beta Chain, Quant, S 1674 (*) <5 (mIU/mL)      Assessment and Plan  Spotting in preg: pt with a modest rise in B-quant. No pain at this time. Will have pt return in 2 days for repeat B-quant. Discussed diet, activity, risks, and precautions. Discussed signs and sx of ectopic preg.  Clinton Gallant. Charisse Wendell III, DrHSc, MPAS, PA-C  02/11/2012, 5:13 PM   Henrietta Hoover, PA 02/11/12 1717

## 2012-02-11 NOTE — Discharge Instructions (Signed)
Vaginal Bleeding During Pregnancy, First Trimester A small amount of bleeding (spotting) is relatively common in early pregnancy. It usually stops on its own. There are many causes for bleeding or spotting in early pregnancy. Some bleeding may be related to the pregnancy and some may not. Cramping with the bleeding is more serious and concerning. Tell your caregiver if you have any vaginal bleeding.  CAUSES   It is normal in most cases.   The pregnancy ends (miscarriage).   The pregnancy may end (threatened miscarriage).   Infection or inflammation of the cervix.   Growths (polyps) on the cervix.   Pregnancy happens outside of the uterus and in a fallopian tube (tubal pregnancy).   Many tiny cysts in the uterus instead of pregnancy tissue (molar pregnancy).  SYMPTOMS  Vaginal bleeding or spotting with or without cramps. DIAGNOSIS  To evaluate the pregnancy, your caregiver may:  Do a pelvic exam.   Take blood tests.   Do an ultrasound.  It is very important to follow your caregiver's instructions.  TREATMENT   Evaluation of the pregnancy with blood tests and ultrasound.   Bed rest (getting up to use the bathroom only).   Rho-gam immunization if the mother is Rh negative and the father is Rh positive.  HOME CARE INSTRUCTIONS   If your caregiver orders bed rest, you may need to make arrangements for the care of other children and for other responsibilities. However, your caregiver may allow you to continue light activity.   Keep track of the number of pads you use each day, how often you change pads and how soaked (saturated) they are. Write this down.   Do not use tampons. Do not douche.   Do not have sexual intercourse or orgasms until approved by your physician.   Save any tissue that you pass for your caregiver to see.   Take medicine for cramps only with your caregiver's permission.   Do not take aspirin because it can make you bleed.  SEEK IMMEDIATE MEDICAL CARE  IF:   You experience severe cramps in your stomach, back or belly (abdomen).   You have an oral temperature above 102 F (38.9 C), not controlled by medicine.   You pass large clots or tissue.   Your bleeding increases or you become light-headed, weak or have fainting episodes.   You develop chills.   You are leaking or have a gush of fluid from your vagina.   You pass out while having a bowel movement. That may mean you have a ruptured tubal pregnancy.  Document Released: 09/11/2005 Document Revised: 08/14/2011 Document Reviewed: 03/23/2009 [employer] Patient Information 2012 [employer]. 

## 2012-02-11 NOTE — Progress Notes (Signed)
Pt here today for F/U BHCG, Pt states she has brownish spotting, denies pain.

## 2012-02-13 ENCOUNTER — Inpatient Hospital Stay (HOSPITAL_COMMUNITY): Payer: Private Health Insurance - Indemnity

## 2012-02-13 ENCOUNTER — Inpatient Hospital Stay (HOSPITAL_COMMUNITY)
Admission: AD | Admit: 2012-02-13 | Discharge: 2012-02-13 | Disposition: A | Discharge status: Home / Self Care | Payer: Private Health Insurance - Indemnity | Source: Ambulatory Visit | Attending: Obstetrics & Gynecology | Admitting: Obstetrics & Gynecology

## 2012-02-13 DIAGNOSIS — Z09 Encounter for follow-up examination after completed treatment for conditions other than malignant neoplasm: Secondary | ICD-10-CM

## 2012-02-13 DIAGNOSIS — O99891 Other specified diseases and conditions complicating pregnancy: Secondary | ICD-10-CM | POA: Insufficient documentation

## 2012-02-13 LAB — HCG, QUANTITATIVE, PREGNANCY: hCG, Beta Chain, Quant, S: 2093 m[IU]/mL — ABNORMAL HIGH (ref <5)

## 2012-02-13 NOTE — Discharge Instructions (Signed)
YOUR ULTRASOUND TODAY SHOWS SOME PROGRESSION. WE WILL NEED FOR YOU TO RETURN IN 10 DAYS TO REPEAT THE ULTRASOUND. NO SEX, NO TAMPONS, NOTHING IN THE VAGINA UNTIL FOLLOW UP. RETURN IMMEDIATELY FOR INCREASED PAIN, BLEEDING, FEVER, FEELING WEAK AND DIZZY OR OTHER PROBLEMS.

## 2012-02-13 NOTE — ED Provider Notes (Signed)
Lauren Conway is a 22 y.o. female who presents to MAU for follow up bhcg. Her initial visit was 02/09/12 and the Bhcg was 1094 and ultrasound showed no IUP. She returned 2/26 and the number increased to 1674. Today the Bhcg is 2093.  Results for orders placed during the hospital encounter of 02/13/12 (from the past 24 hour(s))  HCG, QUANTITATIVE, PREGNANCY     Status: Abnormal   Collection Time   02/13/12  7:45 AM      Component Value Range   hCG, Beta Chain, Quant, S 2093 (*) <5 (mIU/mL)   I discussed the results with Dr. Penne Lash and we will repeat the ultrasound today. Ultrasound now shows a IUGS but no YS or FP. Will  Have patient return in 10 days for ultrasound for progression. Continue ectopic precautions.   [place], [place] 02/13/12 1115

## 2012-02-13 NOTE — Progress Notes (Signed)
Patient to MAU for repeat BHCG. Patient denies any pain but does have a light spotting.

## 2012-02-13 NOTE — ED Provider Notes (Signed)
Imagine c/w IUGS.  Medical Screening exam and patient care preformed by advanced practice provider.  Agree with the above management.

## 2012-02-15 ENCOUNTER — Encounter (HOSPITAL_COMMUNITY): Payer: Self-pay | Admitting: *Deleted

## 2012-02-15 ENCOUNTER — Inpatient Hospital Stay (HOSPITAL_COMMUNITY)
Admission: AD | Admit: 2012-02-15 | Discharge: 2012-02-15 | Disposition: A | Discharge status: Home / Self Care | Payer: Private Health Insurance - Indemnity | Source: Ambulatory Visit | Attending: Obstetrics and Gynecology | Admitting: Obstetrics and Gynecology

## 2012-02-15 ENCOUNTER — Ambulatory Visit (HOSPITAL_COMMUNITY)
Admission: RE | Admit: 2012-02-15 | Discharge: 2012-02-15 | Disposition: A | Discharge status: Home / Self Care | Payer: Private Health Insurance - Indemnity | Source: Ambulatory Visit | Attending: Gynecology | Admitting: Gynecology

## 2012-02-15 DIAGNOSIS — O209 Hemorrhage in early pregnancy, unspecified: Secondary | ICD-10-CM

## 2012-02-15 DIAGNOSIS — O200 Threatened abortion: Secondary | ICD-10-CM | POA: Insufficient documentation

## 2012-02-15 LAB — CBC
MCHC: 33.7 g/dL (ref 30.0–36.0)
Platelets: 329 10*3/uL (ref 150–400)
RDW: 12.4 % (ref 11.5–15.5)
WBC: 7.2 10*3/uL (ref 4.0–10.5)

## 2012-02-15 NOTE — ED Provider Notes (Signed)
History     Chief Complaint  Patient presents with  . Vaginal Bleeding  . Abdominal Cramping   HPI Pt returns with bleeding in pregnancy-she felt a gush of blood this morning and some increase in pain.  She was initially seen on 2/24 with bleeding in pregnancy and abdominal cramping.  Her Beta HCGs have been slowly rising as follows: 2/24-1094, 2/26-1674, 2/28-2093, and today 3/2 2733  Past Medical History  Diagnosis Date  . History of gonorrhea     12/2008; 09/2010;   . History of chlamydia     12/2008; 03/09/2009  . Asthma     History reviewed. No pertinent past surgical history.  No family history on file.  History  Substance Use Topics  . Smoking status: Never Smoker   . Smokeless tobacco: Not on file  . Alcohol Use: No    Allergies: No Known Allergies  Prescriptions prior to admission  Medication Sig Dispense Refill  . acetaminophen (TYLENOL) 325 MG tablet Take 650 mg by mouth once. For headache      . albuterol (PROVENTIL HFA;VENTOLIN HFA) 108 (90 BASE) MCG/ACT inhaler Inhale 1-2 puffs into the lungs every 6 (six) hours as needed for wheezing.  1 Inhaler  0  . Prenatal Vit-Fe Fumarate-FA (PRENATAL MULTIVITAMIN) TABS Take 1 tablet by mouth daily.        ROS Physical Exam   Blood pressure 113/65, pulse 90, temperature 98.4 F (36.9 C), temperature source Oral, resp. rate 18, last menstrual period 01/03/2012.  Physical Exam  Vitals reviewed. Constitutional: She is oriented to person, place, and time. She appears well-developed and well-nourished.  HENT:  Head: Normocephalic.  Eyes: Pupils are equal, round, and reactive to light.  Neck: Normal range of motion. Neck supple.  Cardiovascular: Normal rate.   Respiratory: Effort normal.  GI: Soft. She exhibits no distension. There is no tenderness. There is no rebound and no guarding.  Genitourinary:       Small amount of blood tinged mucuos from cervical os- vagina clean; uterus [facility] nontneder; adnexal without  palpable enlargement or tenderness  Musculoskeletal: Normal range of motion.  Neurological: She is alert and oriented to person, place, and time.  Skin: Skin is warm and dry.  Psychiatric: She has a normal mood and affect.    MAU Course  Procedures   Results for orders placed during the hospital encounter of 02/15/12 (from the past 24 hour(s))  CBC     Status: Abnormal   Collection Time   02/15/12  5:01 PM      Component Value Range   WBC 7.2  4.0 - 10.5 (K/uL)   RBC 3.71 (*) 3.87 - 5.11 (MIL/uL)   Hemoglobin 11.5 (*) 12.0 - 15.0 (g/dL)   HCT 11.9 (*) 14.7 - 46.0 (%)   MCV 91.9  78.0 - 100.0 (fL)   MCH 31.0  26.0 - 34.0 (pg)   MCHC 33.7  30.0 - 36.0 (g/dL)   RDW 82.9  56.2 - 13.0 (%)   Platelets 329  150 - 400 (K/uL)  HCG, QUANTITATIVE, PREGNANCY     Status: Abnormal   Collection Time   02/15/12  5:01 PM      Component Value Range   hCG, Beta Chain, Quant, S 2733 (*) <5 (mIU/mL)  slow rise in HCG-discussed with Dr. Jolayne Panther- will plan on u/s for viability in 10 days(3/7) from initial ultrasound which was on 2/24; pt informed that this is an inappropriate rise in HCG levels and likely a non-  developing pregnancy  Assessment and Plan  Bleeding in pregnancy Threatened miscarriage F/u on 3/7 for ultrasound- sooner if increase in pain or bleeding-( order not put in for sign/held orders because pt is unsure of her work schedule and may return on 3/8)  Tasman Zapata 02/15/2012, 6:14 PM

## 2012-02-15 NOTE — Progress Notes (Signed)
Was seen in MAU Sunday, Tuesday and Thursday to monitor her hormone level, having sharp pain bleeding now like a period.

## 2012-02-16 NOTE — ED Provider Notes (Signed)
Agree with above note.  Lauren Conway 02/16/2012 6:48 AM

## 2012-02-20 ENCOUNTER — Inpatient Hospital Stay (HOSPITAL_COMMUNITY)
Admission: AD | Admit: 2012-02-20 | Discharge: 2012-02-20 | Disposition: A | Discharge status: Home / Self Care | Payer: Private Health Insurance - Indemnity | Source: Ambulatory Visit | Attending: Obstetrics & Gynecology | Admitting: Obstetrics & Gynecology

## 2012-02-20 NOTE — Plan of Care (Signed)
Patient returned today for a follow up ultrasound. Ultrasound results from 3-2 recommended 10-14 day repeat. Georges Mouse, CNM, reviewed report and let the patient decide if she wanted another ultrasound today or to wait until 3-12. Patient indicated she would like to wait.

## 2012-02-25 ENCOUNTER — Inpatient Hospital Stay (HOSPITAL_COMMUNITY): Payer: Private Health Insurance - Indemnity

## 2012-02-25 ENCOUNTER — Inpatient Hospital Stay (HOSPITAL_COMMUNITY)
Admission: AD | Admit: 2012-02-25 | Discharge: 2012-02-25 | Disposition: A | Discharge status: Home / Self Care | Payer: Private Health Insurance - Indemnity | Source: Ambulatory Visit | Attending: Obstetrics & Gynecology | Admitting: Obstetrics & Gynecology

## 2012-02-25 ENCOUNTER — Telehealth: Payer: Self-pay | Admitting: Obstetrics and Gynecology

## 2012-02-25 DIAGNOSIS — O0289 Other abnormal products of conception: Secondary | ICD-10-CM | POA: Insufficient documentation

## 2012-02-25 DIAGNOSIS — O209 Hemorrhage in early pregnancy, unspecified: Secondary | ICD-10-CM

## 2012-02-25 LAB — HCG, QUANTITATIVE, PREGNANCY: hCG, Beta Chain, Quant, S: 7399 m[IU]/mL — ABNORMAL HIGH (ref <5)

## 2012-02-25 MED ORDER — MISOPROSTOL 200 MCG PO TABS: 200.0000 ug | ORAL_TABLET | ORAL | Status: DC

## 2012-02-25 MED ORDER — OXYCODONE-ACETAMINOPHEN 5-325 MG PO TABS: 1.0000 | ORAL_TABLET | Freq: Four times a day (QID) | ORAL | Status: DC | PRN

## 2012-02-25 MED ORDER — OXYCODONE-ACETAMINOPHEN 5-325 MG PO TABS: 1.0000 | ORAL_TABLET | Freq: Four times a day (QID) | ORAL | Status: AC | PRN

## 2012-02-25 MED ORDER — IBUPROFEN 600 MG PO TABS: 600.0000 mg | ORAL_TABLET | Freq: Four times a day (QID) | ORAL | Status: AC | PRN

## 2012-02-25 MED ORDER — PROMETHAZINE HCL 12.5 MG PO TABS: 12.5000 mg | ORAL_TABLET | Freq: Four times a day (QID) | ORAL | Status: DC | PRN

## 2012-02-25 NOTE — MAU Provider Note (Signed)
Lauren Conway y.o.G1P0 @[redacted]w[redacted]d  by LMP presents for [place] to follow up EPB   SUBJECTIVE  HPI:  Initially seen in MAU on 02/09/2012 for vaginal bleeding and cramping. She had abnormally slow rise in quantitative beta hCGs as follows: 2/24: 1090 2/26:  1674 2/28:  2093 3/2: 2730. Initial [place] on 02/16/12 revealed IUGS consistent with [redacted]w[redacted]d, YS*, no fetal pole, small SCH and small fibroid. She was advised to come for ultrasound on 02/20/2012 but did not. She has slight spotting but no abdominal pain. *YS "not isualized" in text of report but summary states YS seen. Past Medical History  Diagnosis Date  . History of gonorrhea     12/2008; 09/2010;   . History of chlamydia     12/2008; 03/09/2009  . Asthma    No past surgical history on file. History   Social History  . Marital Status: Single    Spouse Name: N/A    Number of Children: N/A  . Years of Education: N/A   Occupational History  . Not on file.   Social History Main Topics  . Smoking status: Never Smoker   . Smokeless tobacco: Not on file  . Alcohol Use: No  . Drug Use: No  . Sexually Active: Yes   Other Topics Concern  . Not on file   Social History Narrative  . No narrative on file   No current facility-administered medications on file prior to encounter.   Current Outpatient Prescriptions on File Prior to Encounter  Medication Sig Dispense Refill  . acetaminophen (TYLENOL) 325 MG tablet Take 650 mg by mouth once. For headache      . albuterol (PROVENTIL HFA;VENTOLIN HFA) 108 (90 BASE) MCG/ACT inhaler Inhale 1-2 puffs into the lungs every 6 (six) hours as needed for wheezing.  1 Inhaler  0  . Prenatal Vit-Fe Fumarate-FA (PRENATAL MULTIVITAMIN) TABS Take 1 tablet by mouth daily.       No Known Allergies  ROS: Pertinent items in HPI  OBJECTIVE Blood pressure 113/60, pulse 64, temperature 97.3 F (36.3 C), temperature source Oral, resp. rate 16, last menstrual period 01/03/2012. GENERAL: Well-developed,  well-nourished female in no acute distress.  ABDOMEN: Soft, nontender  LAB RESULTS   IMAGING Empty GS c/w [redacted]w[redacted]d. No FF. No adnexal abnormality  ASSESSMENT  Blighted ovum  PLAN D/W Dr. Macon Large; will ask radiology to clarify re YS on 3/2 [place] -> per Alyson Reedy there was no YS seen.   Pt reluctant to get quant and elects cytotec, but after much discussion will get the quant today and F/U GYN clinic in 1 wk.        Early Intrauterine Pregnancy Failure x ___  Documented intrauterine pregnancy failure less than or equal to [redacted] weeks gestation  __x_  No serious current illness  _x__  Baseline Hgb greater than or equal to 10g/dl  _x__  Patient has easily accessible transportation to the hospital  _x__  Clear preference  _x__  Practitioner/physician deems patient reliable  _x__  Counseling by practitioner or physician  _x__  Patient education by RN  __x_  Consent form signed  _NI__  Rho-Gam given by RN if indicated  Opos  _x__ Medication dispensed   ___x   Cytotec 800 mcg  __x   Intravaginally by patient at home         __   Intravaginally by RN in MAU        __   Rectally by patient at home  __   Rectally by RN in MAU  __x  Ibuprofen 600 mg 1 tablet by mouth every 6 hours as needed #30  __x_  Hydrocodone/acetaminophen 5/325 mg by mouth every 4 to 6 hours as needed  __x_  Phenergan 12.5 mg by mouth every 4 hours as needed for nausea

## 2012-02-25 NOTE — MAU Note (Signed)
Patient to MAU for follow up ultrasound. Denies any pain but does have a slight brown discharge on and off.

## 2012-02-25 NOTE — MAU Provider Note (Signed)
Attestation of Attending Supervision of Advanced Practitioner: Evaluation and management procedures were performed by the PA/NP/CNM/OB Fellow under my supervision/collaboration. Chart reviewed, and agree with management and plan.  Jaynie Collins, M.D. 02/25/2012 4:43 PM

## 2012-03-04 ENCOUNTER — Other Ambulatory Visit: Payer: Private Health Insurance - Indemnity

## 2012-03-04 DIAGNOSIS — O021 Missed abortion: Secondary | ICD-10-CM

## 2012-03-05 ENCOUNTER — Ambulatory Visit: Payer: Private Health Insurance - Indemnity | Admitting: Obstetrics & Gynecology

## 2012-03-07 NOTE — Telephone Encounter (Signed)
See TC note

## 2012-03-12 ENCOUNTER — Ambulatory Visit (INDEPENDENT_AMBULATORY_CARE_PROVIDER_SITE_OTHER): Payer: Private Health Insurance - Indemnity | Admitting: Family Medicine

## 2012-03-12 DIAGNOSIS — O039 Complete or unspecified spontaneous abortion without complication: Secondary | ICD-10-CM

## 2012-03-12 NOTE — Progress Notes (Signed)
Patient here for follow up of miscarriage.  Patient to get follow up bHCG.  Patient not seen by provider.

## 2012-03-13 LAB — HCG, QUANTITATIVE, PREGNANCY: hCG, Beta Chain, Quant, S: 4.8 m[IU]/mL

## 2012-03-16 ENCOUNTER — Telehealth: Payer: Self-pay | Admitting: *Deleted

## 2012-03-16 ENCOUNTER — Encounter (HOSPITAL_COMMUNITY): Payer: Self-pay | Admitting: Emergency Medicine

## 2012-03-16 ENCOUNTER — Emergency Department (HOSPITAL_COMMUNITY)
Admission: EM | Admit: 2012-03-16 | Discharge: 2012-03-17 | Discharge status: Against Medical Advice | Payer: Private Health Insurance - Indemnity | Attending: Emergency Medicine | Admitting: Emergency Medicine

## 2012-03-16 DIAGNOSIS — R059 Cough, unspecified: Secondary | ICD-10-CM | POA: Insufficient documentation

## 2012-03-16 DIAGNOSIS — R05 Cough: Secondary | ICD-10-CM | POA: Insufficient documentation

## 2012-03-16 DIAGNOSIS — R0602 Shortness of breath: Secondary | ICD-10-CM | POA: Insufficient documentation

## 2012-03-16 DIAGNOSIS — R062 Wheezing: Secondary | ICD-10-CM | POA: Insufficient documentation

## 2012-03-16 MED ORDER — ALBUTEROL SULFATE (5 MG/ML) 0.5% IN NEBU
2.5000 mg | INHALATION_SOLUTION | Freq: Once | RESPIRATORY_TRACT | Status: AC
  Administered 2012-03-16: 5 mg via RESPIRATORY_TRACT
  Filled 2012-03-16: qty 1

## 2012-03-16 NOTE — Telephone Encounter (Signed)
Called pt and informed her of test results and no need for additional labs to be done. Pt voiced understanding

## 2012-03-16 NOTE — ED Notes (Signed)
PT. NOT PREGNANT - STATES SHE HAS A MISCARRIAGE LAST MARCH.

## 2012-03-16 NOTE — Telephone Encounter (Signed)
Message copied by Jill Side on Mon Mar 16, 2012  2:55 PM ------      Message from: Levie Heritage      Created: Mon Mar 16, 2012  1:20 PM       Please call patient with results - bHCG at low enough levels that no need for further follow up.

## 2012-03-16 NOTE — ED Notes (Signed)
PT. REPORTS SOB WITH PRODUCTIVE COUGH AND WHEEZING FOR 3 DAYS .

## 2012-03-17 NOTE — ED Notes (Signed)
UNABLE TO LOCATE PT. AT TRIAGE SEVERAL TIMES.  

## 2012-06-18 ENCOUNTER — Inpatient Hospital Stay (HOSPITAL_COMMUNITY)
Admission: AD | Admit: 2012-06-18 | Discharge: 2012-06-18 | Disposition: A | Discharge status: Home / Self Care | Payer: Private Health Insurance - Indemnity | Source: Ambulatory Visit | Attending: Obstetrics and Gynecology | Admitting: Obstetrics and Gynecology

## 2012-06-18 ENCOUNTER — Encounter (HOSPITAL_COMMUNITY): Payer: Self-pay | Admitting: *Deleted

## 2012-06-18 DIAGNOSIS — Z3202 Encounter for pregnancy test, result negative: Secondary | ICD-10-CM

## 2012-06-18 LAB — POCT PREGNANCY, URINE: Preg Test, Ur: NEGATIVE

## 2012-06-18 LAB — URINALYSIS, ROUTINE W REFLEX MICROSCOPIC
Ketones, ur: NEGATIVE mg/dL
Leukocytes, UA: NEGATIVE
Nitrite: NEGATIVE
Protein, ur: NEGATIVE mg/dL
pH: 6 (ref 5.0–8.0)

## 2012-06-18 NOTE — MAU Note (Signed)
PT SAYS LAST CYCLE   WAS 1 DAY LATE.  THIS MTH CYCLE  HAS NOT CAME  YET.     HOME  PREG TEST WAS NEG ON 7-1.   LAST SEX WAS  IN JuNE.   NO BIRTH CONTROL.   WAS SEEN IN OFFICE  IN 02-2011

## 2012-06-18 NOTE — MAU Provider Note (Signed)
History     CSN: [id number]  Arrival date and time: 06/18/12 0105   First Provider Initiated Contact with Patient 06/18/12 0203      No chief complaint on file.  HPI Lauren Conway 21 y.o. Client is 3 days late for menses.  Home pregnancy test is negative.  Came for pregnancy test as she is never late for her period.  MAU pregnancy test is negative.  Does not want any further exam tonight.  Just wanted to know if she was pregnant due to late menses.  Did ask about having a blood hormone level done.  Discussed that this is not medically indicated with a negative pregnancy test.  Client voices understanding.  OB History    Grav Para Term Preterm Abortions TAB SAB Ect Mult Living   1    1  1          Past Medical History  Diagnosis Date  . History of gonorrhea     12/2008; 09/2010;   . History of chlamydia     12/2008; 03/09/2009  . Asthma     Past Surgical History  Procedure Date  . No past surgeries     History reviewed. No pertinent family history.  History  Substance Use Topics  . Smoking status: Current Some Day Smoker -- 0.2 packs/day  . Smokeless tobacco: Not on file  . Alcohol Use: No    Allergies: No Known Allergies  Prescriptions prior to admission  Medication Sig Dispense Refill  . acetaminophen (TYLENOL) 325 MG tablet Take 650 mg by mouth once. For headache      . albuterol (PROVENTIL HFA;VENTOLIN HFA) 108 (90 BASE) MCG/ACT inhaler Inhale 1-2 puffs into the lungs every 6 (six) hours as needed for wheezing.  1 Inhaler  0    Review of Systems  Constitutional: Negative for fever.  Gastrointestinal: Negative for nausea, vomiting and abdominal pain.  Genitourinary: Negative for dysuria.   Physical Exam   Blood pressure 112/77, pulse 77, temperature 98.4 F (36.9 C), temperature source Oral, resp. rate 18, height 5\' 3"  (1.6 m), weight 153 lb 4 oz (69.514 kg), last menstrual period 05/20/2012, unknown if currently breastfeeding.  Physical Exam  Nursing  note and vitals reviewed. Constitutional: She is oriented to person, place, and time. She appears well-developed and well-nourished.  HENT:  Head: Normocephalic.  Eyes: EOM are normal.  Neck: Neck supple.  Musculoskeletal: Normal range of motion.  Neurological: She is alert and oriented to person, place, and time.  Skin: Skin is warm and dry.  Psychiatric: She has a normal mood and affect.    MAU Course  Procedures  MDM Results for orders placed during the hospital encounter of 06/18/12 (from the past 24 hour(s))  URINALYSIS, ROUTINE W REFLEX MICROSCOPIC     Status: Normal   Collection Time   06/18/12  1:21 AM      Component Value Range   Color, Urine YELLOW  YELLOW   APPearance CLEAR  CLEAR   Specific Gravity, Urine 1.025  1.005 - 1.030   pH 6.0  5.0 - 8.0   Glucose, UA NEGATIVE  NEGATIVE mg/dL   Hgb urine dipstick NEGATIVE  NEGATIVE   Bilirubin Urine NEGATIVE  NEGATIVE   Ketones, ur NEGATIVE  NEGATIVE mg/dL   Protein, ur NEGATIVE  NEGATIVE mg/dL   Urobilinogen, UA 0.2  0.0 - 1.0 mg/dL   Nitrite NEGATIVE  NEGATIVE   Leukocytes, UA NEGATIVE  NEGATIVE  POCT PREGNANCY, URINE     Status:  Normal   Collection Time   06/18/12  2:10 AM      Component Value Range   Preg Test, Ur NEGATIVE  NEGATIVE     Assessment and Plan  Negative pregnancy test  Plan Condoms for contraception Make an appointment with your doctor for contraception and other medical needs.  Giacomo Valone 06/18/2012, 2:05 AM

## 2012-06-18 NOTE — MAU Note (Signed)
Pt states she has not had a period since 05/20/2012. Pt states that this is late for her.  Pt also complaining of some cramping in her lower abdomen

## 2012-06-21 NOTE — MAU Provider Note (Signed)
Attestation of Attending Supervision of Advanced Practitioner: Evaluation and management procedures were performed by the PA/NP/CNM/OB Fellow under my supervision/collaboration. Chart reviewed and agree with management and plan.  Taylore Hinde V 06/21/2012 11:17 AM

## 2012-07-09 ENCOUNTER — Ambulatory Visit (INDEPENDENT_AMBULATORY_CARE_PROVIDER_SITE_OTHER): Payer: Managed Care, Other (non HMO) | Admitting: Gynecology

## 2012-07-09 ENCOUNTER — Encounter: Payer: Self-pay | Admitting: Gynecology

## 2012-07-09 VITALS — BP 110/60 | Ht 64.0 in | Wt 152.0 lb

## 2012-07-09 DIAGNOSIS — Z113 Encounter for screening for infections with a predominantly sexual mode of transmission: Secondary | ICD-10-CM

## 2012-07-09 DIAGNOSIS — A64 Unspecified sexually transmitted disease: Secondary | ICD-10-CM | POA: Insufficient documentation

## 2012-07-09 DIAGNOSIS — Z01419 Encounter for gynecological examination (general) (routine) without abnormal findings: Secondary | ICD-10-CM

## 2012-07-09 LAB — CBC WITH DIFFERENTIAL/PLATELET
Basophils Absolute: 0 10*3/uL (ref 0.0–0.1)
Basophils Relative: 1 % (ref 0–1)
Eosinophils Absolute: 0.2 10*3/uL (ref 0.0–0.7)
MCH: 30.3 pg (ref 26.0–34.0)
MCHC: 33.7 g/dL (ref 30.0–36.0)
Neutro Abs: 4.1 10*3/uL (ref 1.7–7.7)
Neutrophils Relative %: 57 % (ref 43–77)
Platelets: 346 10*3/uL (ref 150–400)

## 2012-07-09 NOTE — Progress Notes (Signed)
Lauren Conway Jul 27, 1991 [id number]        21 y.o.  G1P0010 for annual exam.  Several issues noted below.  Past medical history,surgical history, medications, allergies, family history and social history were all reviewed and documented in the EPIC chart. ROS:  Was performed and pertinent positives and negatives are included in the history.  Exam: [name] assistant Filed Vitals:   07/09/12 1001  BP: 110/60  Height: 5\' 4"  (1.626 m)  Weight: 152 lb (68.947 kg)   General appearance  Normal Skin grossly normal Head/Neck normal with no cervical or supraclavicular adenopathy thyroid normal Lungs  clear Cardiac RR, without RMG Abdominal  soft, nontender, without masses, organomegaly or hernia Breasts  examined lying and sitting without masses, retractions, discharge or axillary adenopathy.  Soft swelling right axilla consistent with ectopic breast tissue Pelvic  Ext/BUS/vagina  normal   Cervix  normal GC/Chlamydia  Uterus  anteverted, normal size, shape and contour, midline and mobile nontender   Adnexa  Without masses or tenderness    Anus and perineum  normal     Assessment/Plan:  21 y.o. G59P0010 female for annual exam.   1. Recent miscarriage. Patient had spontaneous miscarriage in March. Resumed menses with LMP 06/18/2012. Reviewed birth control with her. She's not interested. She states that she is interested in becoming pregnant. I stressed the need for multivitamin with folic acid preconceptually.  She will follow up with me if she wants to consider contraception. 2. STD screening. She requests STD screening. She does have a history of gonorrhea and Chlamydia in the past. GC Chlamydia HIV RPR hepatitis B hepatitis C done. 3. Swelling right axilla. Consistent with ectopic breast tissue. Patient notes it comes and goes and became more exaggerated when she was early pregnant before. Options to have evaluated by general surgery for excision versus observation reviewed. Patient would prefer  observation. As long as it comes and goes and remained stable on exam and we'll follow. If it persists/enlarges or develops definitive palpable abnormalities then she'll follow up for evaluation. 4. Pap smear. Patient actually had 2 Pap smears in 2009 2010. Both were normal. Per current screening guidelines we'll not start screening until age 69 and no Pap smear was done today. 5. Gardasil. I again encouraged her to consider the vaccine. I reviewed those that she would have to postpone pregnancy for 6 months. I gave her literature and she'll follow up at her choice. 6. Health maintenance. No other blood work was done as she is low risk historically.  Dara Lords MD, 10:33 AM 07/09/2012

## 2012-07-09 NOTE — Patient Instructions (Signed)
Start on multivitamin with folate acid such as prenatal vitamin. Consider Gardasil vaccine Follow up if you want to discuss contraception. Follow up in one year for annual exam.

## 2012-07-10 LAB — HIV ANTIBODY (ROUTINE TESTING W REFLEX): HIV: NONREACTIVE

## 2012-07-10 LAB — GC/CHLAMYDIA PROBE AMP, GENITAL: Chlamydia, DNA Probe: NEGATIVE

## 2012-07-13 ENCOUNTER — Telehealth: Payer: Self-pay | Admitting: *Deleted

## 2012-07-13 NOTE — Telephone Encounter (Signed)
Pt called requesting STD screening results, LM on voicemail negative results.

## 2012-08-11 LAB — OB RESULTS CONSOLE RUBELLA ANTIBODY, IGM: Rubella: IMMUNE

## 2012-08-26 ENCOUNTER — Inpatient Hospital Stay (HOSPITAL_COMMUNITY)
Admission: AD | Admit: 2012-08-26 | Discharge: 2012-08-26 | Disposition: A | Discharge status: Home / Self Care | Payer: Medicaid Other | Source: Ambulatory Visit | Attending: Obstetrics & Gynecology | Admitting: Obstetrics & Gynecology

## 2012-08-26 ENCOUNTER — Encounter (HOSPITAL_COMMUNITY): Payer: Self-pay

## 2012-08-26 DIAGNOSIS — O2390 Unspecified genitourinary tract infection in pregnancy, unspecified trimester: Secondary | ICD-10-CM | POA: Insufficient documentation

## 2012-08-26 DIAGNOSIS — A499 Bacterial infection, unspecified: Secondary | ICD-10-CM

## 2012-08-26 DIAGNOSIS — B9689 Other specified bacterial agents as the cause of diseases classified elsewhere: Secondary | ICD-10-CM | POA: Diagnosis not present

## 2012-08-26 DIAGNOSIS — R109 Unspecified abdominal pain: Secondary | ICD-10-CM | POA: Insufficient documentation

## 2012-08-26 DIAGNOSIS — N760 Acute vaginitis: Secondary | ICD-10-CM | POA: Insufficient documentation

## 2012-08-26 LAB — URINALYSIS, ROUTINE W REFLEX MICROSCOPIC
Glucose, UA: NEGATIVE mg/dL
Leukocytes, UA: NEGATIVE
Protein, ur: NEGATIVE mg/dL
Specific Gravity, Urine: <1.005 — ABNORMAL LOW (ref 1.005–1.030)
pH: 6 (ref 5.0–8.0)

## 2012-08-26 LAB — WET PREP, GENITAL: Trich, Wet Prep: NONE SEEN

## 2012-08-26 MED ORDER — METRONIDAZOLE 500 MG PO TABS: 500.0000 mg | ORAL_TABLET | Freq: Two times a day (BID) | ORAL | Status: AC

## 2012-08-26 NOTE — MAU Provider Note (Signed)
History     CSN: [id number]  Arrival date and time: 08/26/12 0003   First Provider Initiated Contact with Patient 08/26/12 0158      No chief complaint on file.  HPI Lauren Conway is a 21 y.o. @ 9.[redacted] weeks gestation who presents to MAU with abdominal pain. The pain startedd 2 days ago. She describes the pain as constant aching. The pain increases with urination. Associated symptoms include frequent urination and pressure with urination. First PNV with Dr. Tamela Oddi was 08/10/12. Had ultrasound that gave EDD of 03/25/13. Had blood work and pelvic exam with pap smear and cultures done at same visit. Current sex partner > 1 year. Hx of ovarian cyst. Hx of GC and Chlamydia. Hx of SAB at approximately [redacted] weeks gestation earlier this year. Patient concerned about another miscarriage. The history was provided by the patient.  OB History    Grav Para Term Preterm Abortions TAB SAB Ect Mult Living   2    1  1    0      Past Medical History  Diagnosis Date  . History of gonorrhea     12/2008; 09/2010;   . History of chlamydia     12/2008; 03/09/2009  . Asthma     Past Surgical History  Procedure Date  . No past surgeries     Family History  Problem Relation Age of Onset  . Other Neg Hx     History  Substance Use Topics  . Smoking status: Former Smoker -- 0.2 packs/day  . Smokeless tobacco: Not on file  . Alcohol Use: No     Rare    Allergies:  Allergies  Allergen Reactions  . Latex Itching and Swelling    Prescriptions prior to admission  Medication Sig Dispense Refill  . acetaminophen (TYLENOL) 325 MG tablet Take 650 mg by mouth once. For headache      . albuterol (PROVENTIL HFA;VENTOLIN HFA) 108 (90 BASE) MCG/ACT inhaler Inhale 1-2 puffs into the lungs every 6 (six) hours as needed for wheezing.  1 Inhaler  0  . Prenatal Vit-Fe Fumarate-FA (PRENATAL MULTIVITAMIN) TABS Take 1 tablet by mouth daily.        Review of Systems  Constitutional: Negative for fever and  chills.  HENT: Negative for congestion and sore throat.   Eyes: Negative for blurred vision and double vision.  Respiratory: Negative for cough and wheezing.   Cardiovascular: Negative for chest pain and palpitations.  Gastrointestinal: Positive for nausea and abdominal pain. Negative for heartburn.       Vaginal discharge  Genitourinary: Positive for urgency and frequency.  Skin: Negative for rash.  Neurological: Negative for dizziness, seizures and headaches.  Psychiatric/Behavioral: Negative for depression. The patient is not nervous/anxious and does not have insomnia.    VS: T 98, P 96, R 18, BP 134/70 Physical Exam  Nursing note and vitals reviewed. Constitutional: She is oriented to person, place, and time. She appears well-developed and well-nourished. No distress.  HENT:  Head: Normocephalic and atraumatic.  Eyes: EOM are normal.  Neck: Neck supple.  Cardiovascular: Normal rate.   Respiratory: Effort normal.  GI: Soft. There is no tenderness.  Genitourinary:       External genitalia without lesions. White discharge vaginal vault. Cervix long, closed, no CMT, no adnexal tenderness or mass palpated. Uterus approximately 10 weeks size.  Musculoskeletal: Normal range of motion.  Neurological: She is alert and oriented to person, place, and time.  Skin: Skin is warm and dry.  Psychiatric: She has a normal mood and affect. Her behavior is normal. Judgment and thought content normal.    MAU Course  Procedures Informal bedside ultrasound shows IUP with cardiac activity. Patient able to visualize heart beat.   Results for orders placed during the hospital encounter of 08/26/12 (from the past 24 hour(s))  URINALYSIS, ROUTINE W REFLEX MICROSCOPIC     Status: Abnormal   Collection Time   08/26/12 12:45 AM      Component Value Range   Color, Urine YELLOW  YELLOW   APPearance CLEAR  CLEAR   Specific Gravity, Urine <1.005 (*) 1.005 - 1.030   pH 6.0  5.0 - 8.0   Glucose, UA  NEGATIVE  NEGATIVE mg/dL   Hgb urine dipstick NEGATIVE  NEGATIVE   Bilirubin Urine NEGATIVE  NEGATIVE   Ketones, ur NEGATIVE  NEGATIVE mg/dL   Protein, ur NEGATIVE  NEGATIVE mg/dL   Urobilinogen, UA 0.2  0.0 - 1.0 mg/dL   Nitrite NEGATIVE  NEGATIVE   Leukocytes, UA NEGATIVE  NEGATIVE  POCT PREGNANCY, URINE     Status: Abnormal   Collection Time   08/26/12  1:20 AM      Component Value Range   Preg Test, Ur POSITIVE (*) NEGATIVE  WET PREP, GENITAL     Status: Abnormal   Collection Time   08/26/12  2:31 AM      Component Value Range   Yeast Wet Prep HPF POC NONE SEEN  NONE SEEN   Trich, Wet Prep NONE SEEN  NONE SEEN   Clue Cells Wet Prep HPF POC MODERATE (*) NONE SEEN   WBC, Wet Prep HPF POC FEW (*) NONE SEEN   Assessment: 21 y.o. female @ [redacted]w[redacted]d with viable IUP   Bacterial vaginosis  Plan:  Rx flagyl   Follow up with Dr. Tamela Oddi   Return as needed.    Medication List     As of 08/26/2012  4:06 AM    START taking these medications         metroNIDAZOLE 500 MG tablet   Commonly known as: FLAGYL   Take 1 tablet (500 mg total) by mouth 2 (two) times daily.      CONTINUE taking these medications         acetaminophen 325 MG tablet   Commonly known as: TYLENOL      albuterol 108 (90 BASE) MCG/ACT inhaler   Commonly known as: PROVENTIL HFA;VENTOLIN HFA   Inhale 1-2 puffs into the lungs every 6 (six) hours as needed for wheezing.      prenatal multivitamin Tabs          Where to get your medications    These are the prescriptions that you need to pick up.   You may get these medications from any pharmacy.         metroNIDAZOLE 500 MG tablet          . NEESE,HOPE,RN, FNP, BC 08/26/2012, 2:52 AM

## 2012-08-27 LAB — GC/CHLAMYDIA PROBE AMP, GENITAL
Chlamydia, DNA Probe: NEGATIVE
GC Probe Amp, Genital: NEGATIVE

## 2012-08-28 ENCOUNTER — Encounter (HOSPITAL_COMMUNITY): Payer: Self-pay | Admitting: *Deleted

## 2012-08-28 ENCOUNTER — Inpatient Hospital Stay (HOSPITAL_COMMUNITY)
Admission: AD | Admit: 2012-08-28 | Discharge: 2012-08-28 | Disposition: A | Discharge status: Home / Self Care | Payer: Managed Care, Other (non HMO) | Source: Ambulatory Visit | Attending: Obstetrics | Admitting: Obstetrics

## 2012-08-28 DIAGNOSIS — A499 Bacterial infection, unspecified: Secondary | ICD-10-CM | POA: Insufficient documentation

## 2012-08-28 DIAGNOSIS — O2390 Unspecified genitourinary tract infection in pregnancy, unspecified trimester: Secondary | ICD-10-CM | POA: Insufficient documentation

## 2012-08-28 DIAGNOSIS — N760 Acute vaginitis: Secondary | ICD-10-CM | POA: Insufficient documentation

## 2012-08-28 DIAGNOSIS — B9689 Other specified bacterial agents as the cause of diseases classified elsewhere: Secondary | ICD-10-CM | POA: Insufficient documentation

## 2012-08-28 DIAGNOSIS — O209 Hemorrhage in early pregnancy, unspecified: Secondary | ICD-10-CM

## 2012-08-28 NOTE — MAU Provider Note (Signed)
  History     CSN: [id number]  Arrival date and time: 08/28/12 1714   First Provider Initiated Contact with Patient 08/28/12 1837      Chief Complaint  Patient presents with  . Vaginal Bleeding   HPI This is a 21 y.o. female at [redacted]w[redacted]d who presents with c/o vaginal bleeding today.  Is worried this might be a miscarriage due to having one earlier this year.  Does report Intercourse 2 days ago. Is being treated for BV (seen here 2 days ago). OB History    Grav Para Term Preterm Abortions TAB SAB Ect Mult Living   2    1  1    0      Past Medical History  Diagnosis Date  . History of gonorrhea     12/2008; 09/2010;   . History of chlamydia     12/2008; 03/09/2009  . Asthma     Past Surgical History  Procedure Date  . No past surgeries     Family History  Problem Relation Age of Onset  . Other Neg Hx   . Hypertension Mother     History  Substance Use Topics  . Smoking status: Former Smoker -- 0.2 packs/day  . Smokeless tobacco: Not on file  . Alcohol Use: No     Rare    Allergies:  Allergies  Allergen Reactions  . Latex Itching and Swelling    Prescriptions prior to admission  Medication Sig Dispense Refill  . acetaminophen (TYLENOL) 325 MG tablet Take 650 mg by mouth every 6 (six) hours as needed. For headache      . albuterol (PROVENTIL HFA;VENTOLIN HFA) 108 (90 BASE) MCG/ACT inhaler Inhale 1-2 puffs into the lungs every 6 (six) hours as needed for wheezing.  1 Inhaler  0  . metroNIDAZOLE (FLAGYL) 500 MG tablet Take 1 tablet (500 mg total) by mouth 2 (two) times daily.  14 tablet  0  . Prenatal Vit-Fe Fumarate-FA (PRENATAL MULTIVITAMIN) TABS Take 1 tablet by mouth daily.        ROS Per HPI  Physical Exam   Blood pressure 113/64, pulse 77, temperature 97.7 F (36.5 C), temperature source Oral, resp. rate 16, height 5\' 4"  (1.626 m), weight 154 lb (69.854 kg), last menstrual period 06/18/2012.  Physical Exam  Constitutional: She is oriented to person,  place, and time. She appears well-developed and well-nourished. No distress.  Cardiovascular: Normal rate.   Respiratory: Effort normal. No respiratory distress.  GI: Soft. There is no tenderness.  Genitourinary: Uterus normal. Vaginal discharge (Scant brown discharge, cervix long and closed) found.       Fetus active on bedside US with FHR 150s  Musculoskeletal: Normal range of motion.  Neurological: She is alert and oriented to person, place, and time.  Skin: Skin is warm and dry.  Psychiatric: She has a normal mood and affect.    MAU Course  Procedures  Assessment and Plan  A:  SIUP at [redacted]w[redacted]d      Old bleeding in pregnancy with viable fetus      Recent IC, postcoital bleeding likely  P:  Discharge home       Continue meds        Followup in office  [facility] 08/28/2012, 6:49 PM

## 2012-08-28 NOTE — MAU Note (Signed)
Seen in MAU 2-3 days ago and is being treated for BV;

## 2012-08-28 NOTE — MAU Note (Signed)
Bedside scan done by Artelia Laroche CNM, cardiac activity and plenty of movement noted.

## 2012-10-03 ENCOUNTER — Encounter (HOSPITAL_COMMUNITY): Payer: Self-pay | Admitting: Obstetrics and Gynecology

## 2012-10-03 ENCOUNTER — Inpatient Hospital Stay (HOSPITAL_COMMUNITY)
Admission: AD | Admit: 2012-10-03 | Discharge: 2012-10-03 | Disposition: A | Discharge status: Home / Self Care | Payer: Medicaid Other | Source: Ambulatory Visit | Attending: Obstetrics & Gynecology | Admitting: Obstetrics & Gynecology

## 2012-10-03 DIAGNOSIS — N949 Unspecified condition associated with female genital organs and menstrual cycle: Secondary | ICD-10-CM | POA: Insufficient documentation

## 2012-10-03 DIAGNOSIS — B349 Viral infection, unspecified: Secondary | ICD-10-CM

## 2012-10-03 DIAGNOSIS — J3489 Other specified disorders of nose and nasal sinuses: Secondary | ICD-10-CM | POA: Insufficient documentation

## 2012-10-03 DIAGNOSIS — B9789 Other viral agents as the cause of diseases classified elsewhere: Secondary | ICD-10-CM | POA: Insufficient documentation

## 2012-10-03 DIAGNOSIS — O99891 Other specified diseases and conditions complicating pregnancy: Secondary | ICD-10-CM | POA: Insufficient documentation

## 2012-10-03 DIAGNOSIS — R109 Unspecified abdominal pain: Secondary | ICD-10-CM | POA: Insufficient documentation

## 2012-10-03 LAB — URINALYSIS, ROUTINE W REFLEX MICROSCOPIC
Glucose, UA: NEGATIVE mg/dL
Hgb urine dipstick: NEGATIVE
Protein, ur: NEGATIVE mg/dL

## 2012-10-03 NOTE — MAU Provider Note (Signed)
History     CSN: [id number]  Arrival date and time: 10/03/12 4098   First Provider Initiated Contact with Patient 10/03/12 2118      Chief Complaint  Patient presents with  . Abdominal Pain  . Nasal Congestion   HPI Pt is a G2P0010 at 15.2 wks IUP here with  "Nasal Congestion for 4 days. Then the sneezing and the coughing started 2 days ago. I have been taking the [facility] brand of Multi-SymptomTylenol Allergy. RLQ pain that started earlier this week. When I lay on my LT side it eases up, but since I was here I thought I would get it all checked out."   No report of vaginal bleeding or leaking of fluid.    Past Medical History  Diagnosis Date  . History of gonorrhea     12/2008; 09/2010;   . History of chlamydia     12/2008; 03/09/2009  . Asthma     Past Surgical History  Procedure Date  . No past surgeries     Family History  Problem Relation Age of Onset  . Other Neg Hx   . Hypertension Mother     History  Substance Use Topics  . Smoking status: Former Smoker -- 0.2 packs/day  . Smokeless tobacco: Not on file  . Alcohol Use: No     Rare    Allergies:  Allergies  Allergen Reactions  . Latex Itching and Swelling    Prescriptions prior to admission  Medication Sig Dispense Refill  . acetaminophen (TYLENOL) 325 MG tablet Take 650 mg by mouth every 6 (six) hours as needed. For headache      . albuterol (PROVENTIL HFA;VENTOLIN HFA) 108 (90 BASE) MCG/ACT inhaler Inhale 1-2 puffs into the lungs every 6 (six) hours as needed for wheezing.  1 Inhaler  0  . Chlorphen-Phenyleph-APAP (TYLENOL ALLERGY MULTI-SYMPTOM) 2-5-325 MG TABS Take 2 capsules by mouth 2 (two) times daily as needed. Cold symptoms      . Prenatal Vit-Fe Fumarate-FA (PRENATAL MULTIVITAMIN) TABS Take 1 tablet by mouth at bedtime.         Review of Systems  Constitutional: Negative.   Respiratory: Positive for cough, shortness of breath and wheezing.   Gastrointestinal: Positive for abdominal  pain (lower pelvic pain).  Genitourinary: Negative.   All other systems reviewed and are negative.   Physical Exam   Blood pressure 129/67, pulse 97, temperature 98.2 F (36.8 C), temperature source Oral, resp. rate 20, height 5\' 9"  (1.753 m), weight 72.122 kg (159 lb), last menstrual period 06/18/2012.  Pulse ox - 100%  Physical Exam  Constitutional: She is oriented to person, place, and time. She appears well-developed and well-nourished. No distress.  HENT:  Head: Normocephalic.  Neck: Normal range of motion. Neck supple.  Cardiovascular: Normal rate, regular rhythm and normal heart sounds.   Respiratory: Effort normal and breath sounds normal. No respiratory distress. She has no wheezes. She has no rales.  GI: Soft. There is no tenderness.  Genitourinary: No bleeding around the vagina. Vaginal discharge (mucusy) found.       Cervix - closed  Musculoskeletal: Normal range of motion. She exhibits no edema.  Neurological: She is alert and oriented to person, place, and time.  Skin: Skin is warm and dry.   FHR 152 MAU Course  Procedures  Results for orders placed during the hospital encounter of 10/03/12 (from the past 24 hour(s))  URINALYSIS, ROUTINE W REFLEX MICROSCOPIC     Status: Abnormal   Collection  Time   10/03/12  8:08 PM      Component Value Range   Color, Urine YELLOW  YELLOW   APPearance HAZY (*) CLEAR   Specific Gravity, Urine 1.020  1.005 - 1.030   pH 6.5  5.0 - 8.0   Glucose, UA NEGATIVE  NEGATIVE mg/dL   Hgb urine dipstick NEGATIVE  NEGATIVE   Bilirubin Urine NEGATIVE  NEGATIVE   Ketones, ur NEGATIVE  NEGATIVE mg/dL   Protein, ur NEGATIVE  NEGATIVE mg/dL   Urobilinogen, UA 0.2  0.0 - 1.0 mg/dL   Nitrite NEGATIVE  NEGATIVE   Leukocytes, UA NEGATIVE  NEGATIVE     Assessment and Plan  Viral Illness Pelvic Pain in Pregnancy  Plan: DC to home Increase fluids; Tylenol for pain Report fever or worsening of symptoms Pregnancy precautions  given.  [facility]  [facility] 10/03/2012, 9:21 PM

## 2012-10-03 NOTE — MAU Note (Signed)
"  Nasal Congestion for 4 days.  Then the sneezing and the coughing started 2 days ago.  I have been taking the [facility] brand of Multi-SymptomTylenol Allergy.  RLQ pain that started earlier this week.  When I lay on my LT side it eases up, but since I was here I thought I would get it all checked out."

## 2012-10-03 NOTE — MAU Note (Signed)
Has had nasal congestion for couple days. And runny nose all wk. Headaches every day. Felt hot at home today and thought might have fever but did not have thermometer. Pain R lower stomach which I've had before while pregnant and when not pregnant. Feel weak all over

## 2012-10-05 ENCOUNTER — Other Ambulatory Visit: Payer: Self-pay | Admitting: Advanced Practice Midwife

## 2012-10-05 DIAGNOSIS — J45909 Unspecified asthma, uncomplicated: Secondary | ICD-10-CM

## 2012-10-05 MED ORDER — ALBUTEROL SULFATE HFA 108 (90 BASE) MCG/ACT IN AERS: 1.0000 | INHALATION_SPRAY | Freq: Four times a day (QID) | RESPIRATORY_TRACT | Status: DC | PRN

## 2012-10-05 NOTE — Progress Notes (Signed)
Pt called stating that her albuterol inhaler needs to be refilled, thought that it was being called in following her MAU visit a few days ago. Refill ordered.

## 2012-11-28 ENCOUNTER — Encounter (HOSPITAL_COMMUNITY): Payer: Self-pay | Admitting: Obstetrics and Gynecology

## 2012-11-28 ENCOUNTER — Inpatient Hospital Stay (HOSPITAL_COMMUNITY)
Admission: AD | Admit: 2012-11-28 | Discharge: 2012-11-28 | Disposition: A | Discharge status: Home / Self Care | Payer: Medicaid Other | Source: Ambulatory Visit | Attending: Obstetrics & Gynecology | Admitting: Obstetrics & Gynecology

## 2012-11-28 DIAGNOSIS — B3731 Acute candidiasis of vulva and vagina: Secondary | ICD-10-CM

## 2012-11-28 DIAGNOSIS — B373 Candidiasis of vulva and vagina: Secondary | ICD-10-CM

## 2012-11-28 DIAGNOSIS — O479 False labor, unspecified: Secondary | ICD-10-CM

## 2012-11-28 DIAGNOSIS — R109 Unspecified abdominal pain: Secondary | ICD-10-CM | POA: Insufficient documentation

## 2012-11-28 DIAGNOSIS — N949 Unspecified condition associated with female genital organs and menstrual cycle: Secondary | ICD-10-CM | POA: Insufficient documentation

## 2012-11-28 DIAGNOSIS — O4700 False labor before 37 completed weeks of gestation, unspecified trimester: Secondary | ICD-10-CM

## 2012-11-28 LAB — URINALYSIS, ROUTINE W REFLEX MICROSCOPIC
Glucose, UA: NEGATIVE mg/dL
Leukocytes, UA: NEGATIVE
Protein, ur: NEGATIVE mg/dL
Specific Gravity, Urine: 1.02 (ref 1.005–1.030)
Urobilinogen, UA: 0.2 mg/dL (ref 0.0–1.0)

## 2012-11-28 LAB — WET PREP, GENITAL

## 2012-11-28 LAB — FETAL FIBRONECTIN: Fetal Fibronectin: NEGATIVE

## 2012-11-28 LAB — CBC
HCT: 36.1 % (ref 36.0–46.0)
Hemoglobin: 12.2 g/dL (ref 12.0–15.0)
MCV: 90.7 fL (ref 78.0–100.0)
WBC: 9.1 10*3/uL (ref 4.0–10.5)

## 2012-11-28 MED ORDER — TERBUTALINE SULFATE 1 MG/ML IJ SOLN
0.2500 mg | Freq: Once | INTRAMUSCULAR | Status: AC
  Administered 2012-11-28: 0.25 mg via SUBCUTANEOUS
  Filled 2012-11-28: qty 1

## 2012-11-28 MED ORDER — NIFEDIPINE 10 MG PO CAPS
10.0000 mg | ORAL_CAPSULE | ORAL | Status: DC | PRN
  Administered 2012-11-28 (×2): 10 mg via ORAL
  Filled 2012-11-28 (×2): qty 1

## 2012-11-28 NOTE — MAU Note (Signed)
C/o abd pain and cramping since last night. Denies vag bleeding or  leaking

## 2012-11-28 NOTE — MAU Provider Note (Signed)
Chief Complaint:  Abdominal Cramping  First Provider Initiated Contact with Patient 11/28/12 [phone] .   HPI: Lauren Conway is a 21 y.o. G2P0010 at [redacted]w[redacted]d who presents to maternity admissions reporting abd pain and cramping since 0200 and yellow, odorless vaginal discharge. Rates pain 10/10. Denies vaginal bleeding or leaking. Good fetal movement.   Past Medical History: Past Medical History  Diagnosis Date  . History of gonorrhea     12/2008; 09/2010;   . History of chlamydia     12/2008; 03/09/2009  . Asthma     Past obstetric history: OB History    Grav Para Term Preterm Abortions TAB SAB Ect Mult Living   2    1  1    0     # Outc Date GA Lbr Len/2nd Wgt Sex Del Anes PTL Lv   1 SAB  [redacted]w[redacted]d          Comments: no complication   2 CUR               Past Surgical History: Past Surgical History  Procedure Date  . No past surgeries     Family History: Family History  Problem Relation Age of Onset  . Other Neg Hx   . Hypertension Mother     Social History: History  Substance Use Topics  . Smoking status: Former Smoker -- 0.2 packs/day  . Smokeless tobacco: Not on file  . Alcohol Use: No     Comment: Rare    Allergies:  Allergies  Allergen Reactions  . Latex Itching and Swelling    Meds:  Prescriptions prior to admission  Medication Sig Dispense Refill  . albuterol (PROVENTIL HFA;VENTOLIN HFA) 108 (90 BASE) MCG/ACT inhaler Inhale 1-2 puffs into the lungs every 6 (six) hours as needed for wheezing.  1 Inhaler  0  . Prenatal Vit-Fe Fumarate-FA (PRENATAL MULTIVITAMIN) TABS Take 1 tablet by mouth at bedtime.       . triamcinolone ointment (KENALOG) 0.1 % Apply 1 application topically at bedtime. For ecsema        ROS: Pertinent findings in history of present illness.  Physical Exam  Blood pressure 144/125, pulse 99, resp. rate 18, last menstrual period 06/18/2012. GENERAL: Well-developed, well-nourished female in no acute distress.  HEENT:  normocephalic HEART: normal rate RESP: normal effort ABDOMEN: Soft, non-tender, gravid appropriate for gestational age EXTREMITIES: Nontender, no edema NEURO: alert and oriented SPECULUM EXAM: NEFG, small amount of thick, white, odorless discharge, no blood, cervix clean. No pooling.  Dilation: Closed Effacement (%): Thick Cervical Position: Posterior Exam by:: Ivonne Andrew CNM   FHT:  Baseline 140-250, minimal-moderate variability, no accelerations present, no decelerations. Reassuring for gestation.  Contractions: q 3-4 mins   Labs: Results for orders placed during the hospital encounter of 11/28/12 (from the past 24 hour(s))  URINALYSIS, ROUTINE W REFLEX MICROSCOPIC     Status: Abnormal   Collection Time   11/28/12  7:43 AM      Component Value Range   Color, Urine YELLOW  YELLOW   APPearance HAZY (*) CLEAR   Specific Gravity, Urine 1.020  1.005 - 1.030   pH 7.0  5.0 - 8.0   Glucose, UA NEGATIVE  NEGATIVE mg/dL   Hgb urine dipstick NEGATIVE  NEGATIVE   Bilirubin Urine NEGATIVE  NEGATIVE   Ketones, ur NEGATIVE  NEGATIVE mg/dL   Protein, ur NEGATIVE  NEGATIVE mg/dL   Urobilinogen, UA 0.2  0.0 - 1.0 mg/dL   Nitrite NEGATIVE  NEGATIVE  Leukocytes, UA NEGATIVE  NEGATIVE  WET PREP, GENITAL     Status: Abnormal   Collection Time   11/28/12  8:35 AM      Component Value Range   Yeast Wet Prep HPF POC FEW (*) NONE SEEN   Trich, Wet Prep NONE SEEN  NONE SEEN   Clue Cells Wet Prep HPF POC NONE SEEN  NONE SEEN   WBC, Wet Prep HPF POC FEW (*) NONE SEEN  FETAL FIBRONECTIN     Status: Normal   Collection Time   11/28/12  8:39 AM      Component Value Range   Fetal Fibronectin NEGATIVE  NEGATIVE  CBC     Status: Normal   Collection Time   11/28/12 10:44 AM      Component Value Range   WBC 9.1  4.0 - 10.5 K/uL   RBC 3.98  3.87 - 5.11 MIL/uL   Hemoglobin 12.2  12.0 - 15.0 g/dL   HCT 16.1  09.6 - 04.5 %   MCV 90.7  78.0 - 100.0 fL   MCH 30.7  26.0 - 34.0 pg   MCHC 33.8  30.0 -  36.0 g/dL   RDW 40.9  81.1 - 91.4 %   Platelets 284  150 - 400 K/uL    Imaging:  No results found. MAU Course: 11:00: mild improvement in UC's after two doses of Procardia. Pain localized more to right. No pain btw UC's. Still rates pain 5/10. Will check CBC and give Terb.   12:18: Pain 4/10. UI present, no UC's. Per Dr Gaynell Face give second dose of terb and D/C home.   Assessment: 1. Preterm contractions    Plan: Discharge home Preterm labor precautions and fetal kick counts. Increase rest and fluids.  Follow-up Information    Follow up with [facility]. (as scheduled)    Contact information:   [address] [place] [address] [phone]      Follow up with [facility]. (As needed if symptoms worsen)    Contact information:   [address] [id number] mc [place] [address] [phone]           Medication List     As of 11/28/2012  1:31 PM    ASK your doctor about these medications         albuterol 108 (90 BASE) MCG/ACT inhaler   Commonly known as: PROVENTIL HFA;VENTOLIN HFA   Inhale 1-2 puffs into the lungs every 6 (six) hours as needed for wheezing.      prenatal multivitamin Tabs   Take 1 tablet by mouth at bedtime.      triamcinolone ointment 0.1 %   Commonly known as: KENALOG   Apply 1 application topically at bedtime. For [facility], [place] 11/28/2012 8:14 AM

## 2012-11-28 NOTE — MAU Note (Signed)
Pt presents to MAU with chief complaint of abdominal pain; lower, cramping, constant. Pt is [redacted]w[redacted]d, patient of Dr. Tamela Oddi. Pt says the pain started at 0200 and kept her up off and on throughout the night. Pt denies bleeding, however says that she has noticed a discharge that is non-odorous and yellow. Pt has received regular prenatal care with this pregnancy.

## 2012-11-28 NOTE — Discharge Instructions (Signed)
Candidal Vulvovaginitis Candidal vulvovaginitis is an infection of the vagina and vulva. The vulva is the skin around the opening of the vagina. This may cause itching and discomfort in and around the vagina.  HOME CARE  You may take Monistat 7 over-the-counter if you are experiencing itching.  Do not douche or use tampons.  Wear cotton underwear. Do not wear tight pants or panty hose.  Eat yogurt. This may help treat and prevent yeast infections. GET HELP RIGHT AWAY IF:   You have a fever.  Your problems get worse during treatment or do not get better in 3 days.  You have pain after sex.  You start to get belly (abdominal) pain. MAKE SURE YOU:  Understand these instructions.  Will watch your condition.  Will get help right away if you are not doing well or get worse. Document Released: 02/28/2009 Document Revised: 02/24/2012 Document Reviewed: 02/28/2009 [facility] Patient Information 2013 [place], [place].

## 2012-11-30 ENCOUNTER — Encounter (HOSPITAL_COMMUNITY): Payer: Self-pay

## 2012-11-30 ENCOUNTER — Inpatient Hospital Stay (HOSPITAL_COMMUNITY)
Admission: AD | Admit: 2012-11-30 | Discharge: 2012-11-30 | Disposition: A | Discharge status: Home / Self Care | Payer: BC Managed Care – PPO | Source: Ambulatory Visit | Attending: Obstetrics & Gynecology | Admitting: Obstetrics & Gynecology

## 2012-11-30 DIAGNOSIS — R109 Unspecified abdominal pain: Secondary | ICD-10-CM | POA: Insufficient documentation

## 2012-11-30 DIAGNOSIS — O4700 False labor before 37 completed weeks of gestation, unspecified trimester: Secondary | ICD-10-CM | POA: Insufficient documentation

## 2012-11-30 LAB — URINALYSIS, ROUTINE W REFLEX MICROSCOPIC
Bilirubin Urine: NEGATIVE
Glucose, UA: NEGATIVE mg/dL
Hgb urine dipstick: NEGATIVE
Specific Gravity, Urine: 1.025 (ref 1.005–1.030)

## 2012-11-30 NOTE — MAU Note (Signed)
Pt seen in MAU 2 days ago, feeling mild uc's today, tightening in abdomen.  Mild pain on R side.  States pain is milder today than 2 days ago.  Pt denies bleeding.

## 2012-11-30 NOTE — MAU Provider Note (Signed)
History     CSN: [id number]  Arrival date and time: 11/30/12 7829   None     Chief Complaint  Patient presents with  . Abdominal Pain   HPI 21 y.o. G2P0010 at [redacted]w[redacted]d with mild low abd cramping, no contractions, no bleeding. States she was seen 2 days ago with contractions, cervix closed, FFN negative at that time. States she was told to come in if she had any cramping at all, so she came in today.   Past Medical History  Diagnosis Date  . History of gonorrhea     12/2008; 09/2010;   . History of chlamydia     12/2008; 03/09/2009  . Asthma     Past Surgical History  Procedure Date  . No past surgeries     Family History  Problem Relation Age of Onset  . Other Neg Hx   . Hypertension Mother     History  Substance Use Topics  . Smoking status: Former Smoker -- 0.2 packs/day  . Smokeless tobacco: Not on file  . Alcohol Use: No     Comment: Rare    Allergies:  Allergies  Allergen Reactions  . Latex Itching and Swelling    Prescriptions prior to admission  Medication Sig Dispense Refill  . albuterol (PROVENTIL HFA;VENTOLIN HFA) 108 (90 BASE) MCG/ACT inhaler Inhale 1-2 puffs into the lungs every 6 (six) hours as needed for wheezing.  1 Inhaler  0  . Prenatal Vit-Fe Fumarate-FA (PRENATAL MULTIVITAMIN) TABS Take 1 tablet by mouth at bedtime.       . triamcinolone ointment (KENALOG) 0.1 % Apply 1 application topically at bedtime. For ecsema        Review of Systems  Constitutional: Negative.   Respiratory: Negative.   Cardiovascular: Negative.   Gastrointestinal: Negative for nausea, vomiting, abdominal pain, diarrhea and constipation.  Genitourinary: Negative for dysuria, urgency, frequency, hematuria and flank pain.       Negative for vaginal bleeding, vaginal discharge  Musculoskeletal: Negative.   Neurological: Negative.   Psychiatric/Behavioral: Negative.    Physical Exam   Blood pressure 101/59, pulse 89, temperature 97.7 F (36.5 C), temperature  source Oral, resp. rate 18, height 5\' 4"  (1.626 m), weight 163 lb 6.4 oz (74.118 kg), last menstrual period 06/18/2012.  Physical Exam  Nursing note and vitals reviewed. Constitutional: She is oriented to person, place, and time. She appears well-developed and well-nourished. No distress.  Cardiovascular: Normal rate.   Respiratory: Effort normal.  GI: Soft. There is no tenderness.  Genitourinary:        Closed/thick/high   Musculoskeletal: Normal range of motion.  Neurological: She is alert and oriented to person, place, and time.  Skin: Skin is warm and dry.  Psychiatric: She has a normal mood and affect.   EFM baseline 145, TOCO quiet  MAU Course  Procedures  Results for orders placed during the hospital encounter of 11/30/12 (from the past 24 hour(s))  URINALYSIS, ROUTINE W REFLEX MICROSCOPIC     Status: Normal   Collection Time   11/30/12  9:55 AM      Component Value Range   Color, Urine YELLOW  YELLOW   APPearance CLEAR  CLEAR   Specific Gravity, Urine 1.025  1.005 - 1.030   pH 6.0  5.0 - 8.0   Glucose, UA NEGATIVE  NEGATIVE mg/dL   Hgb urine dipstick NEGATIVE  NEGATIVE   Bilirubin Urine NEGATIVE  NEGATIVE   Ketones, ur NEGATIVE  NEGATIVE mg/dL   Protein, ur NEGATIVE  NEGATIVE mg/dL   Urobilinogen, UA 0.2  0.0 - 1.0 mg/dL   Nitrite NEGATIVE  NEGATIVE   Leukocytes, UA NEGATIVE  NEGATIVE     Assessment and Plan   1. Threatened preterm labor       Medication List     As of 11/30/2012 12:35 PM    CONTINUE taking these medications         albuterol 108 (90 BASE) MCG/ACT inhaler   Commonly known as: PROVENTIL HFA;VENTOLIN HFA   Inhale 1-2 puffs into the lungs every 6 (six) hours as needed for wheezing.      prenatal multivitamin Tabs      triamcinolone ointment 0.1 %   Commonly known as: KENALOG            Follow-up Information    Follow up with Roseanna Rainbow, MD. On 12/03/2012. (as scheduled)    Contact information:   [address]  [address] [place] [address] [phone]            Georges Mouse 11/30/2012, 10:28 AM

## 2012-11-30 NOTE — MAU Note (Signed)
Patient is in with c/o mild constant lower abdominal cramping. She states that she was seen 2 days ago and was told that she was contraction, cervix was closed/ long and negative FFN. She states that she was urged to return if she have any cramping or problems. She denies any vaginal bleeding, abnormal discharge or lof. She reports drinking plenty of water (8-9 bottles per day per patient).

## 2012-12-04 ENCOUNTER — Inpatient Hospital Stay (HOSPITAL_COMMUNITY)
Admission: AD | Admit: 2012-12-04 | Discharge: 2012-12-04 | Disposition: A | Discharge status: Home / Self Care | Payer: Managed Care, Other (non HMO) | Source: Ambulatory Visit | Attending: Obstetrics | Admitting: Obstetrics

## 2012-12-04 ENCOUNTER — Inpatient Hospital Stay (HOSPITAL_COMMUNITY): Payer: Managed Care, Other (non HMO)

## 2012-12-04 ENCOUNTER — Encounter (HOSPITAL_COMMUNITY): Payer: Self-pay | Admitting: *Deleted

## 2012-12-04 DIAGNOSIS — O4700 False labor before 37 completed weeks of gestation, unspecified trimester: Secondary | ICD-10-CM

## 2012-12-04 LAB — URINALYSIS, ROUTINE W REFLEX MICROSCOPIC
Glucose, UA: NEGATIVE mg/dL
Leukocytes, UA: NEGATIVE
Specific Gravity, Urine: 1.015 (ref 1.005–1.030)
pH: 7.5 (ref 5.0–8.0)

## 2012-12-04 MED ORDER — ALBUTEROL SULFATE HFA 108 (90 BASE) MCG/ACT IN AERS: 2.0000 | INHALATION_SPRAY | RESPIRATORY_TRACT | Status: DC | PRN

## 2012-12-04 NOTE — MAU Provider Note (Signed)
History     CSN: [id number]  Arrival date and time: 12/04/12 1316   First Provider Initiated Contact with Patient 12/04/12 1353      Chief Complaint  Patient presents with  . Contractions   HPI  Pt is [redacted]w[redacted]d pregnant and started having contractions 5 minutes apart since 12:10pm today. She had 3 contractions that she felt in her back but none now.  She last had IC 2 weeks ago.  She was here on Sat 12/14 for contractions and she was given 2 doses  Procardia And then 2 injections of terbutaline.  Pt had fFN which was negative.  She denies constipation or diarrhea, spotting, bleeding or LOF. Past Medical History  Diagnosis Date  . History of gonorrhea     12/2008; 09/2010;   . History of chlamydia     12/2008; 03/09/2009  . Asthma     Past Surgical History  Procedure Date  . No past surgeries     Family History  Problem Relation Age of Onset  . Other Neg Hx   . Hypertension Mother     History  Substance Use Topics  . Smoking status: Former Smoker -- 0.2 packs/day  . Smokeless tobacco: Not on file  . Alcohol Use: No     Comment: Rare    Allergies:  Allergies  Allergen Reactions  . Latex Itching and Swelling    Prescriptions prior to admission  Medication Sig Dispense Refill  . albuterol (PROVENTIL HFA;VENTOLIN HFA) 108 (90 BASE) MCG/ACT inhaler Inhale 2 puffs into the lungs every 6 (six) hours as needed.      . Prenatal Vit-Fe Fumarate-FA (PRENATAL MULTIVITAMIN) TABS Take 1 tablet by mouth at bedtime.       . triamcinolone ointment (KENALOG) 0.1 % Apply 1 application topically daily. For eczema      . [DISCONTINUED] albuterol (PROVENTIL HFA;VENTOLIN HFA) 108 (90 BASE) MCG/ACT inhaler Inhale 1-2 puffs into the lungs every 6 (six) hours as needed for wheezing.  1 Inhaler  0    ROS Physical Exam   Blood pressure 116/62, pulse 85, temperature 97 F (36.1 C), temperature source Oral, resp. rate 16, last menstrual period 06/18/2012.  Physical Exam  Vitals  reviewed. Constitutional: She is oriented to person, place, and time. She appears well-developed and well-nourished.  HENT:  Head: Normocephalic.  Eyes: Pupils are equal, round, and reactive to light.  Neck: Normal range of motion. Neck supple.  Cardiovascular: Normal rate.   Respiratory: Effort normal.  GI: Soft. She exhibits no distension. There is no tenderness. There is no rebound and no guarding.       No ctx palpated or noted on monitor- FHR reassuring for gestational age  Genitourinary:       Cervix FT , soft,50% effaced, anterior  Musculoskeletal: Normal range of motion.  Neurological: She is alert and oriented to person, place, and time.  Skin: Skin is warm and dry.  Psychiatric: She has a normal mood and affect.    MAU Course  Procedures (Pt had an anatomy scan at 20 weeks).  Even though no ctx at this time, pt's cervix has changed from previous visit on 12/16 when her cervix was long, closed and high- posterior Discussed with Dr. Clearance Coots- will get ultrasound to check cervical length  Discussed with Dr. Clearance Coots- pt may d/c home and f/u in office on Monday at 10am Assessment and Plan  Threatened preterm labor D/c home f/u with Dr. Clearance Coots on Monday  out of work until further  notice- work note given until Monday Prescription for Albuterol inhaler  [place]ano Rosevear 12/04/2012, 1:54 PM

## 2012-12-04 NOTE — MAU Note (Signed)
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

## 2012-12-16 DIAGNOSIS — K2100 Gastro-esophageal reflux disease with esophagitis, without bleeding: Secondary | ICD-10-CM

## 2012-12-16 NOTE — L&D Delivery Note (Signed)
Delivery Note At 6:14 AM a viable female was delivered via Vaginal, Spontaneous Delivery (Presentation: ;  ).  APGAR: , ; weight .   Placenta status: , .  Cord:  with the following complications: .  Cord pH: not done  Anesthesia:   Episiotomy:  Lacerations:  Suture Repair: 2.0 vicryl Est. Blood Loss (mL):   Mom to postpartum.  Baby to nursery-stable.  MARSHALL,BERNARD A 03/19/2013, 6:24 AM

## 2012-12-29 ENCOUNTER — Inpatient Hospital Stay (HOSPITAL_COMMUNITY)
Admission: AD | Admit: 2012-12-29 | Discharge: 2012-12-29 | Disposition: A | Discharge status: Home / Self Care | Payer: Medicaid Other | Source: Ambulatory Visit | Attending: Obstetrics & Gynecology | Admitting: Obstetrics & Gynecology

## 2012-12-29 ENCOUNTER — Encounter (HOSPITAL_COMMUNITY): Payer: Self-pay | Admitting: *Deleted

## 2012-12-29 DIAGNOSIS — R1013 Epigastric pain: Secondary | ICD-10-CM | POA: Insufficient documentation

## 2012-12-29 DIAGNOSIS — O99891 Other specified diseases and conditions complicating pregnancy: Secondary | ICD-10-CM | POA: Insufficient documentation

## 2012-12-29 DIAGNOSIS — K219 Gastro-esophageal reflux disease without esophagitis: Secondary | ICD-10-CM | POA: Insufficient documentation

## 2012-12-29 DIAGNOSIS — K2100 Gastro-esophageal reflux disease with esophagitis, without bleeding: Secondary | ICD-10-CM

## 2012-12-29 LAB — URINALYSIS, ROUTINE W REFLEX MICROSCOPIC
Ketones, ur: NEGATIVE mg/dL
Leukocytes, UA: NEGATIVE
Nitrite: NEGATIVE
Specific Gravity, Urine: 1.025 (ref 1.005–1.030)
pH: 6 (ref 5.0–8.0)

## 2012-12-29 LAB — COMPREHENSIVE METABOLIC PANEL
AST: 19 U/L (ref 0–37)
Albumin: 2.7 g/dL — ABNORMAL LOW (ref 3.5–5.2)
Alkaline Phosphatase: 82 U/L (ref 39–117)
BUN: 6 mg/dL (ref 6–23)
Chloride: 100 mEq/L (ref 96–112)
Potassium: 3.5 mEq/L (ref 3.5–5.1)
Total Bilirubin: 0.2 mg/dL — ABNORMAL LOW (ref 0.3–1.2)

## 2012-12-29 LAB — CBC WITH DIFFERENTIAL/PLATELET
Basophils Relative: 0 % (ref 0–1)
Hemoglobin: 11.1 g/dL — ABNORMAL LOW (ref 12.0–15.0)
MCHC: 34.3 g/dL (ref 30.0–36.0)
Monocytes Relative: 6 % (ref 3–12)
Neutro Abs: 6.3 10*3/uL (ref 1.7–7.7)
Neutrophils Relative %: 67 % (ref 43–77)
RBC: 3.59 MIL/uL — ABNORMAL LOW (ref 3.87–5.11)
WBC: 9.5 10*3/uL (ref 4.0–10.5)

## 2012-12-29 MED ORDER — RANITIDINE HCL 150 MG PO TABS: 150.0000 mg | ORAL_TABLET | Freq: Two times a day (BID) | ORAL | Status: DC

## 2012-12-29 MED ORDER — FAMOTIDINE 20 MG PO TABS
20.0000 mg | ORAL_TABLET | Freq: Once | ORAL | Status: AC
  Administered 2012-12-29: 20 mg via ORAL
  Filled 2012-12-29: qty 1

## 2012-12-29 MED ORDER — ONDANSETRON 4 MG PO TBDP
4.0000 mg | ORAL_TABLET | Freq: Once | ORAL | Status: AC
  Administered 2012-12-29: 4 mg via ORAL
  Filled 2012-12-29: qty 1

## 2012-12-29 MED ORDER — GI COCKTAIL ~~LOC~~
30.0000 mL | Freq: Once | ORAL | Status: AC
  Administered 2012-12-29: 30 mL via ORAL
  Filled 2012-12-29: qty 30

## 2012-12-29 NOTE — MAU Provider Note (Signed)
History     CSN: [id number]  Arrival date and time: 12/29/12 0045   None     Chief Complaint  Patient presents with  . Abdominal Pain   HPI Lauren Conway is a 22 y.o. female @ [redacted]w[redacted]d gestation who presents to MAU with abdominal pain. The pain started 3 days ago. The pain is located in the epigastric area.  The onset was sudden.  She describes the pain as an aching pain that is constant.  She rates the pain as  7/10. Eating makes the pain worse. Lying flat makes the pain worse.  Associated symptoms include nausea. Ate Taco salad before going to bed tonight and could not sleep due to the pain. The history was provided by the patient.  OB History    Grav Para Term Preterm Abortions TAB SAB Ect Mult Living   2    1  1    0      Past Medical History  Diagnosis Date  . History of gonorrhea     12/2008; 09/2010;   . History of chlamydia     12/2008; 03/09/2009  . Asthma     Past Surgical History  Procedure Date  . No past surgeries     Family History  Problem Relation Age of Onset  . Other Neg Hx   . Hypertension Mother     History  Substance Use Topics  . Smoking status: Former Smoker -- 0.2 packs/day  . Smokeless tobacco: Not on file  . Alcohol Use: No     Comment: Rare    Allergies:  Allergies  Allergen Reactions  . Latex Itching and Swelling    Prescriptions prior to admission  Medication Sig Dispense Refill  . albuterol (PROVENTIL HFA;VENTOLIN HFA) 108 (90 BASE) MCG/ACT inhaler Inhale 2 puffs into the lungs every 4 (four) hours as needed for wheezing.  1 Inhaler  0  . Prenatal Vit-Fe Fumarate-FA (PRENATAL MULTIVITAMIN) TABS Take 1 tablet by mouth at bedtime.       . triamcinolone ointment (KENALOG) 0.1 % Apply 1 application topically daily. For eczema      . albuterol (PROVENTIL HFA;VENTOLIN HFA) 108 (90 BASE) MCG/ACT inhaler Inhale 2 puffs into the lungs every 6 (six) hours as needed.        Review of Systems  Constitutional: Negative for fever, chills  and weight loss.  HENT: Negative for ear pain, nosebleeds, congestion, sore throat and neck pain.   Eyes: Negative for blurred vision, double vision, photophobia and pain.  Respiratory: Positive for wheezing (asthma). Negative for cough and shortness of breath.   Cardiovascular: Negative for chest pain, palpitations and leg swelling.  Gastrointestinal: Positive for nausea and abdominal pain. Negative for heartburn, vomiting, diarrhea and constipation.  Genitourinary: Positive for frequency. Negative for dysuria and urgency.  Musculoskeletal: Positive for back pain. Negative for myalgias.  Skin: Negative for itching and rash.  Neurological: Negative for dizziness, sensory change, speech change, seizures, weakness and headaches.  Endo/Heme/Allergies: Does not bruise/bleed easily.  Psychiatric/Behavioral: Negative for depression and substance abuse. The patient is not nervous/anxious and does not have insomnia.    Blood pressure 99/55, pulse 90, temperature 97.8 F (36.6 C), temperature source Oral, resp. rate 18, height 5\' 4"  (1.626 m), weight 170 lb (77.111 kg), last menstrual period 06/18/2012, SpO2 100.00%.  Physical Exam  Nursing note and vitals reviewed. Constitutional: She is oriented to person, place, and time. She appears well-developed and well-nourished. No distress.  HENT:  Head: Normocephalic and atraumatic.  Eyes: EOM are normal.  Neck: Neck supple.  Cardiovascular: Normal rate.   Respiratory: Effort normal.  GI: Soft. There is tenderness in the epigastric area. There is no rigidity, no rebound, no guarding and no CVA tenderness.  Musculoskeletal: Normal range of motion.  Neurological: She is alert and oriented to person, place, and time.  Skin: Skin is warm and dry.  Psychiatric: She has a normal mood and affect. Her behavior is normal. Judgment and thought content normal.   EFM: Baseline 135, reassuring, no contractions.  Results for orders placed during the hospital  encounter of 12/29/12 (from the past 24 hour(s))  URINALYSIS, ROUTINE W REFLEX MICROSCOPIC     Status: Normal   Collection Time   12/29/12 12:58 AM      Component Value Range   Color, Urine YELLOW  YELLOW   APPearance CLEAR  CLEAR   Specific Gravity, Urine 1.025  1.005 - 1.030   pH 6.0  5.0 - 8.0   Glucose, UA NEGATIVE  NEGATIVE mg/dL   Hgb urine dipstick NEGATIVE  NEGATIVE   Bilirubin Urine NEGATIVE  NEGATIVE   Ketones, ur NEGATIVE  NEGATIVE mg/dL   Protein, ur NEGATIVE  NEGATIVE mg/dL   Urobilinogen, UA 0.2  0.0 - 1.0 mg/dL   Nitrite NEGATIVE  NEGATIVE   Leukocytes, UA NEGATIVE  NEGATIVE  CBC WITH DIFFERENTIAL     Status: Abnormal   Collection Time   12/29/12  2:00 AM      Component Value Range   WBC 9.5  4.0 - 10.5 K/uL   RBC 3.59 (*) 3.87 - 5.11 MIL/uL   Hemoglobin 11.1 (*) 12.0 - 15.0 g/dL   HCT 40.9 (*) 81.1 - 91.4 %   MCV 90.3  78.0 - 100.0 fL   MCH 30.9  26.0 - 34.0 pg   MCHC 34.3  30.0 - 36.0 g/dL   RDW 78.2  95.6 - 21.3 %   Platelets 255  150 - 400 K/uL   Neutrophils Relative 67  43 - 77 %   Neutro Abs 6.3  1.7 - 7.7 K/uL   Lymphocytes Relative 24  12 - 46 %   Lymphs Abs 2.3  0.7 - 4.0 K/uL   Monocytes Relative 6  3 - 12 %   Monocytes Absolute 0.6  0.1 - 1.0 K/uL   Eosinophils Relative 3  0 - 5 %   Eosinophils Absolute 0.2  0.0 - 0.7 K/uL   Basophils Relative 0  0 - 1 %   Basophils Absolute 0.0  0.0 - 0.1 K/uL  COMPREHENSIVE METABOLIC PANEL     Status: Abnormal   Collection Time   12/29/12  2:00 AM      Component Value Range   Sodium 134 (*) 135 - 145 mEq/L   Potassium 3.5  3.5 - 5.1 mEq/L   Chloride 100  96 - 112 mEq/L   CO2 23  19 - 32 mEq/L   Glucose, Bld 84  70 - 99 mg/dL   BUN 6  6 - 23 mg/dL   Creatinine, Ser 0.86  0.50 - 1.10 mg/dL   Calcium 9.1  8.4 - 57.8 mg/dL   Total Protein 6.2  6.0 - 8.3 g/dL   Albumin 2.7 (*) 3.5 - 5.2 g/dL   AST 19  0 - 37 U/L   ALT 16  0 - 35 U/L   Alkaline Phosphatase 82  39 - 117 U/L   Total Bilirubin 0.2 (*) 0.3 - 1.2  mg/dL  GFR calc non Af Amer >90  >90 mL/min   GFR calc Af Amer >90  >90 mL/min  LIPASE, BLOOD     Status: Normal   Collection Time   12/29/12  2:00 AM      Component Value Range   Lipase 46  11 - 59 U/L    Patient feeling much better after Pepcid and GI Cocktail Procedures Assessment: 22 y.o. female @ [redacted]w[redacted]d gestation with epigastric pain   Reflux  Plan:  Zantac Rx   Follow up in the office, return here as needed   Discussed with patient if symptom worsen may need gall bladder ultrasound   Discussed with the patient and all questioned fully answered. She will follow up in the office or return here if any problems arise.   Medication List     As of 12/29/2012  3:10 AM    START taking these medications         ranitidine 150 MG tablet   Commonly known as: ZANTAC   Take 1 tablet (150 mg total) by mouth 2 (two) times daily.      CONTINUE taking these medications         * albuterol 108 (90 BASE) MCG/ACT inhaler   Commonly known as: PROVENTIL HFA;VENTOLIN HFA      * albuterol 108 (90 BASE) MCG/ACT inhaler   Commonly known as: PROVENTIL HFA;VENTOLIN HFA   Inhale 2 puffs into the lungs every 4 (four) hours as needed for wheezing.      prenatal multivitamin Tabs      triamcinolone ointment 0.1 %   Commonly known as: KENALOG     * Notice: This list has 2 medication(s) that are the same as other medications prescribed for you. Read the directions carefully, and ask your doctor or other care provider to review them with you.        Where to get your medications    These are the prescriptions that you need to pick up. We sent them to a specific pharmacy, so you will need to go there to get them.   [id number] - [place], [place] - [address] AT [facility] [address]    [address] [place] [address]    Phone: [phone]        ranitidine 150 MG tablet              Aidee Latimore, RN, FNP, [facility] 12/29/2012, 3:08 AM

## 2012-12-29 NOTE — MAU Note (Signed)
Pt reports upper mid abd pain x 3 days, worsening and now constant. Nausea constantly.

## 2013-01-29 ENCOUNTER — Inpatient Hospital Stay (HOSPITAL_COMMUNITY)
Admission: AD | Admit: 2013-01-29 | Discharge: 2013-01-29 | Disposition: A | Discharge status: Home / Self Care | Payer: BC Managed Care – PPO | Source: Ambulatory Visit | Attending: Obstetrics & Gynecology | Admitting: Obstetrics & Gynecology

## 2013-01-29 ENCOUNTER — Encounter (HOSPITAL_COMMUNITY): Payer: Self-pay

## 2013-01-29 DIAGNOSIS — O479 False labor, unspecified: Secondary | ICD-10-CM

## 2013-01-29 DIAGNOSIS — R109 Unspecified abdominal pain: Secondary | ICD-10-CM | POA: Insufficient documentation

## 2013-01-29 DIAGNOSIS — O4700 False labor before 37 completed weeks of gestation, unspecified trimester: Secondary | ICD-10-CM | POA: Insufficient documentation

## 2013-01-29 DIAGNOSIS — O4703 False labor before 37 completed weeks of gestation, third trimester: Secondary | ICD-10-CM

## 2013-01-29 LAB — URINALYSIS, ROUTINE W REFLEX MICROSCOPIC
Bilirubin Urine: NEGATIVE
Glucose, UA: NEGATIVE mg/dL
Hgb urine dipstick: NEGATIVE
Specific Gravity, Urine: 1.02 (ref 1.005–1.030)
pH: 6 (ref 5.0–8.0)

## 2013-01-29 LAB — WET PREP, GENITAL
Clue Cells Wet Prep HPF POC: NONE SEEN
Trich, Wet Prep: NONE SEEN

## 2013-01-29 LAB — FETAL FIBRONECTIN: Fetal Fibronectin: NEGATIVE

## 2013-01-29 NOTE — MAU Note (Signed)
Problems with some constipation had small bowel movement yesterday, normal bowel movement on Tuesday, not currently taking anything for constipation.

## 2013-01-29 NOTE — MAU Note (Signed)
Abdominal and pelvic pressure since yesterday worse today.

## 2013-01-29 NOTE — MAU Note (Signed)
Patient to the bathroom had bowel movement.

## 2013-01-29 NOTE — MAU Provider Note (Signed)
History     CSN: [id number]  Arrival date and time: 01/29/13 1334   First Provider Initiated Contact with Patient 01/29/13 1425      Chief Complaint  Patient presents with  . Abdominal Pain   HPI 22 y.o. G2P0010 at [redacted]w[redacted]d with low abd pressure intermittently since last night. No bleeding or LOF.    Past Medical History  Diagnosis Date  . History of gonorrhea     12/2008; 09/2010;   . History of chlamydia     12/2008; 03/09/2009  . Asthma     Past Surgical History  Procedure Laterality Date  . No past surgeries      Family History  Problem Relation Age of Onset  . Other Neg Hx   . Hypertension Mother     History  Substance Use Topics  . Smoking status: Former Smoker -- 0.25 packs/day  . Smokeless tobacco: Not on file  . Alcohol Use: No     Comment: Rare    Allergies:  Allergies  Allergen Reactions  . Latex Itching and Swelling    Prescriptions prior to admission  Medication Sig Dispense Refill  . albuterol (PROVENTIL HFA;VENTOLIN HFA) 108 (90 BASE) MCG/ACT inhaler Inhale 2 puffs into the lungs every 4 (four) hours as needed for wheezing.  1 Inhaler  0  . Prenatal Vit-Fe Fumarate-FA (PRENATAL MULTIVITAMIN) TABS Take 1 tablet by mouth at bedtime.       . triamcinolone ointment (KENALOG) 0.1 % Apply 1 application topically daily. For eczema        Review of Systems  Constitutional: Negative.   Respiratory: Negative.   Cardiovascular: Negative.   Gastrointestinal: Negative for nausea, vomiting, abdominal pain, diarrhea and constipation.  Genitourinary: Negative for dysuria, urgency, frequency, hematuria and flank pain.       Negative for vaginal bleeding, vaginal discharge, contractions   Musculoskeletal: Negative.   Neurological: Negative.   Psychiatric/Behavioral: Negative.    Physical Exam   Height 5\' 4"  (1.626 m), weight 180 lb 6.4 oz (81.829 kg), last menstrual period 06/18/2012.  Physical Exam  Nursing note and vitals  reviewed. Constitutional: She is oriented to person, place, and time. She appears well-developed and well-nourished. No distress.  Cardiovascular: Normal rate.   Respiratory: Effort normal.  GI: Soft. There is no tenderness.  Genitourinary: No vaginal discharge found.  Dilation: Closed (ft external os closed internal) Effacement (%): Thick Station: -3 Exam by:: Georges Mouse CNM   Musculoskeletal: Normal range of motion.  Neurological: She is alert and oriented to person, place, and time.  Skin: Skin is warm and dry.  Psychiatric: She has a normal mood and affect.    MAU Course  Procedures  Results for orders placed during the hospital encounter of 01/29/13 (from the past 24 hour(s))  URINALYSIS, ROUTINE W REFLEX MICROSCOPIC     Status: None   Collection Time    01/29/13  2:00 PM      Result Value Range   Color, Urine YELLOW  YELLOW   APPearance CLEAR  CLEAR   Specific Gravity, Urine 1.020  1.005 - 1.030   pH 6.0  5.0 - 8.0   Glucose, UA NEGATIVE  NEGATIVE mg/dL   Hgb urine dipstick NEGATIVE  NEGATIVE   Bilirubin Urine NEGATIVE  NEGATIVE   Ketones, ur NEGATIVE  NEGATIVE mg/dL   Protein, ur NEGATIVE  NEGATIVE mg/dL   Urobilinogen, UA 0.2  0.0 - 1.0 mg/dL   Nitrite NEGATIVE  NEGATIVE   Leukocytes, UA NEGATIVE  NEGATIVE  WET PREP, GENITAL     Status: Abnormal   Collection Time    01/29/13  2:15 PM      Result Value Range   Yeast Wet Prep HPF POC NONE SEEN  NONE SEEN   Trich, Wet Prep NONE SEEN  NONE SEEN   Clue Cells Wet Prep HPF POC NONE SEEN  NONE SEEN   WBC, Wet Prep HPF POC FEW (*) NONE SEEN  FETAL FIBRONECTIN     Status: None   Collection Time    01/29/13  2:15 PM      Result Value Range   Fetal Fibronectin NEGATIVE  NEGATIVE   EFM reactive, TOCO initially q2-4 min, then resolved spontaneously  Assessment and Plan   1. Preterm uterine contractions, third trimester       Medication List    TAKE these medications       albuterol 108 (90 BASE) MCG/ACT  inhaler  Commonly known as:  PROVENTIL HFA;VENTOLIN HFA  Inhale 2 puffs into the lungs every 4 (four) hours as needed for wheezing.     prenatal multivitamin Tabs  Take 1 tablet by mouth at bedtime.     triamcinolone ointment 0.1 %  Commonly known as:  KENALOG  Apply 1 application topically daily. For eczema         Follow-up Information   Follow up with Roseanna Rainbow, MD. (as scheduled or sooner as needed)    Contact information:   [address] [place] [address] [phone]         Georges Mouse 01/29/2013, 3:20 PM

## 2013-02-16 ENCOUNTER — Inpatient Hospital Stay (HOSPITAL_COMMUNITY)
Admission: AD | Admit: 2013-02-16 | Discharge: 2013-02-17 | Disposition: A | Discharge status: Home / Self Care | Payer: Medicaid Other | Source: Ambulatory Visit | Attending: Obstetrics | Admitting: Obstetrics

## 2013-02-16 DIAGNOSIS — K5900 Constipation, unspecified: Secondary | ICD-10-CM

## 2013-02-16 DIAGNOSIS — O99891 Other specified diseases and conditions complicating pregnancy: Secondary | ICD-10-CM | POA: Insufficient documentation

## 2013-02-16 DIAGNOSIS — O212 Late vomiting of pregnancy: Secondary | ICD-10-CM | POA: Insufficient documentation

## 2013-02-16 DIAGNOSIS — O219 Vomiting of pregnancy, unspecified: Secondary | ICD-10-CM

## 2013-02-17 DIAGNOSIS — O479 False labor, unspecified: Secondary | ICD-10-CM

## 2013-02-17 DIAGNOSIS — R110 Nausea: Secondary | ICD-10-CM

## 2013-02-17 DIAGNOSIS — K5900 Constipation, unspecified: Secondary | ICD-10-CM

## 2013-02-17 LAB — URINALYSIS, ROUTINE W REFLEX MICROSCOPIC
Bilirubin Urine: NEGATIVE
Hgb urine dipstick: NEGATIVE
Ketones, ur: NEGATIVE mg/dL
Nitrite: NEGATIVE
Protein, ur: NEGATIVE mg/dL
Specific Gravity, Urine: 1.01 (ref 1.005–1.030)
Urobilinogen, UA: 0.2 mg/dL (ref 0.0–1.0)

## 2013-02-17 MED ORDER — MAGNESIUM HYDROXIDE 400 MG/5ML PO SUSP: 30.0000 mL | Freq: Once | ORAL | Status: DC

## 2013-02-17 MED ORDER — MAGNESIUM HYDROXIDE 400 MG/5ML PO SUSP
30.0000 mL | Freq: Once | ORAL | Status: AC
  Administered 2013-02-17: 30 mL via ORAL
  Filled 2013-02-17: qty 30

## 2013-02-17 MED ORDER — PROMETHAZINE HCL 25 MG PO TABS
25.0000 mg | ORAL_TABLET | Freq: Once | ORAL | Status: AC
  Administered 2013-02-17: 25 mg via ORAL
  Filled 2013-02-17: qty 1

## 2013-02-17 NOTE — MAU Note (Signed)
Pt reports for the last 2-3 days she has not been able to have a BM, also reports the baby has been moving  "excessively", and also reports nausea.

## 2013-02-17 NOTE — MAU Provider Note (Signed)
Chief Complaint:  Constipation   First Provider Initiated Contact with Patient 02/17/13 0236    HPI: Lauren Conway is a 22 y.o. G2P0010 at [redacted]w[redacted]d who presents to maternity admissions reporting: 1. No BM x 3 days and accompanying generalized abd discomfort. 2. Nausea sx 3 days, no vomiting . 3. Excessive fetal movement causing discomfort and waking her from sleep.   She denies fever, chills, contractions, leakage of fluid or vaginal bleeding. She has not tried anything for the constipation or nausea.   Past Medical History: Past Medical History  Diagnosis Date  . History of gonorrhea     12/2008; 09/2010;   . History of chlamydia     12/2008; 03/09/2009  . Asthma     Past obstetric history:     OB History   Grav Para Term Preterm Abortions TAB SAB Ect Mult Living   2    1  1    0     # Outc Date GA Lbr Len/2nd Wgt Sex Del Anes PTL Lv   1 SAB  [redacted]w[redacted]d          Comments: no complication   2 CUR               Past Surgical History: Past Surgical History  Procedure Laterality Date  . No past surgeries      Family History: Family History  Problem Relation Age of Onset  . Other Neg Hx   . Hypertension Mother     Social History: History  Substance Use Topics  . Smoking status: Former Smoker -- 0.25 packs/day  . Smokeless tobacco: Not on file  . Alcohol Use: No     Comment: Rare    Allergies:  Allergies  Allergen Reactions  . Latex Itching and Swelling    Meds:  Prescriptions prior to admission  Medication Sig Dispense Refill  . albuterol (PROVENTIL HFA;VENTOLIN HFA) 108 (90 BASE) MCG/ACT inhaler Inhale 2 puffs into the lungs every 4 (four) hours as needed for wheezing.  1 Inhaler  0  . Prenatal Vit-Fe Fumarate-FA (PRENATAL MULTIVITAMIN) TABS Take 1 tablet by mouth at bedtime.       . triamcinolone ointment (KENALOG) 0.1 % Apply 1 application topically daily. For eczema        ROS: Pertinent findings in history of present illness.  Physical Exam  Blood  pressure 110/61, pulse 90, temperature 98.4 F (36.9 C), temperature source Oral, resp. rate 18, height 5\' 4"  (1.626 m), weight 82.101 kg (181 lb), last menstrual period 06/18/2012, SpO2 100.00%. GENERAL: Well-developed, well-nourished female in no acute distress.  HEENT: normocephalic HEART: normal rate RESP: normal effort ABDOMEN: Soft, non-tender, mildly bloated, gravid appropriate for gestational age EXTREMITIES: Nontender, no edema NEURO: alert and oriented SPECULUM EXAM: Deferred   FHT:  Baseline 120 , moderate variability, accelerations present, no decelerations Contractions: irreg, mild   Labs: Results for orders placed during the hospital encounter of 02/16/13 (from the past 24 hour(s))  URINALYSIS, ROUTINE W REFLEX MICROSCOPIC     Status: None   Collection Time    02/17/13 12:05 AM      Result Value Range   Color, Urine YELLOW  YELLOW   APPearance CLEAR  CLEAR   Specific Gravity, Urine 1.010  1.005 - 1.030   pH 7.0  5.0 - 8.0   Glucose, UA NEGATIVE  NEGATIVE mg/dL   Hgb urine dipstick NEGATIVE  NEGATIVE   Bilirubin Urine NEGATIVE  NEGATIVE   Ketones, ur NEGATIVE  NEGATIVE mg/dL  Protein, ur NEGATIVE  NEGATIVE mg/dL   Urobilinogen, UA 0.2  0.0 - 1.0 mg/dL   Nitrite NEGATIVE  NEGATIVE   Leukocytes, UA NEGATIVE  NEGATIVE    Imaging:  No results found.  MAU Course: Phenergan and Milk of Mag given.  Assessment: 1. Constipation in pregnancy, third trimester   2. Nausea and vomiting of pregnancy, antepartum    Plan: Discharge home Preterm labor precautions and fetal kick counts. Increase fluids and fiber. Milk of mag daily until soft daily BM.  Reassurance given RE: normal fetal activity.    Medication List    TAKE these medications       albuterol 108 (90 BASE) MCG/ACT inhaler  Commonly known as:  PROVENTIL HFA;VENTOLIN HFA  Inhale 2 puffs into the lungs every 4 (four) hours as needed for wheezing.     magnesium hydroxide 400 MG/5ML suspension   Commonly known as:  MILK OF MAGNESIA  Take 30 mLs by mouth once.     prenatal multivitamin Tabs  Take 1 tablet by mouth at bedtime.     triamcinolone ointment 0.1 %  Commonly known as:  KENALOG  Apply 1 application topically daily. For eczema       Follow-up Information   Follow up with HARPER,CHARLES A, MD. (as scheduled)    Contact information:   [address] [place] [address] [phone]      Dorathy Kinsman, [place] 02/17/2013 2:28 AM

## 2013-02-19 ENCOUNTER — Encounter (HOSPITAL_COMMUNITY): Payer: Self-pay | Admitting: *Deleted

## 2013-02-19 ENCOUNTER — Inpatient Hospital Stay (HOSPITAL_COMMUNITY)
Admission: AD | Admit: 2013-02-19 | Discharge: 2013-02-19 | Disposition: A | Discharge status: Home / Self Care | Payer: Medicaid Other | Source: Ambulatory Visit | Attending: Obstetrics & Gynecology | Admitting: Obstetrics & Gynecology

## 2013-02-19 DIAGNOSIS — O4703 False labor before 37 completed weeks of gestation, third trimester: Secondary | ICD-10-CM

## 2013-02-19 DIAGNOSIS — O4700 False labor before 37 completed weeks of gestation, unspecified trimester: Secondary | ICD-10-CM | POA: Insufficient documentation

## 2013-02-19 DIAGNOSIS — O479 False labor, unspecified: Secondary | ICD-10-CM

## 2013-02-19 LAB — URINALYSIS, ROUTINE W REFLEX MICROSCOPIC
Bilirubin Urine: NEGATIVE
Glucose, UA: NEGATIVE mg/dL
Hgb urine dipstick: NEGATIVE
Ketones, ur: NEGATIVE mg/dL
Protein, ur: NEGATIVE mg/dL
Urobilinogen, UA: 0.2 mg/dL (ref 0.0–1.0)

## 2013-02-19 NOTE — MAU Note (Signed)
Was walking around in [place] about ago and started having strong contractions. Was here in December with contractions and again 2/14. No meds at home for ctxs

## 2013-02-19 NOTE — MAU Provider Note (Signed)
History     CSN: [id number]  Arrival date and time: 02/19/13 1959   None     Chief Complaint  Patient presents with  . Contractions   HPI 22 y.o. G2P0010 at [redacted]w[redacted]d with contractions starting about 30 min prior to arrival in MAU, no bleeding or LOF. Started while walking at [place], now decreasing in frequency and intensity.   Past Medical History  Diagnosis Date  . History of gonorrhea     12/2008; 09/2010;   . History of chlamydia     12/2008; 03/09/2009  . Asthma     Past Surgical History  Procedure Laterality Date  . No past surgeries      Family History  Problem Relation Age of Onset  . Other Neg Hx   . Hypertension Mother     History  Substance Use Topics  . Smoking status: Former Smoker -- 0.25 packs/day  . Smokeless tobacco: Not on file  . Alcohol Use: No     Comment: Rare    Allergies:  Allergies  Allergen Reactions  . Latex Itching and Swelling    Prescriptions prior to admission  Medication Sig Dispense Refill  . albuterol (PROVENTIL HFA;VENTOLIN HFA) 108 (90 BASE) MCG/ACT inhaler Inhale 2 puffs into the lungs every 4 (four) hours as needed for wheezing.  1 Inhaler  0  . Prenatal Vit-Fe Fumarate-FA (PRENATAL MULTIVITAMIN) TABS Take 1 tablet by mouth at bedtime.       . triamcinolone ointment (KENALOG) 0.1 % Apply 1 application topically daily. For eczema        Review of Systems  Constitutional: Negative.   Respiratory: Negative.   Cardiovascular: Negative.   Gastrointestinal: Negative for nausea, vomiting, abdominal pain, diarrhea and constipation.  Genitourinary: Negative for dysuria, urgency, frequency, hematuria and flank pain.       Negative for vaginal bleeding, Positive  cramping/contractions  Musculoskeletal: Negative.   Neurological: Negative.   Psychiatric/Behavioral: Negative.    Physical Exam   Blood pressure 123/68, pulse 91, temperature 97.8 F (36.6 C), temperature source Oral, resp. rate 22, height 5\' 4"  (1.626 m),  weight 183 lb 3.2 oz (83.099 kg), last menstrual period 06/18/2012, SpO2 100.00%.  Physical Exam  Nursing note and vitals reviewed. Constitutional: She is oriented to person, place, and time. She appears well-developed and well-nourished. No distress.  Cardiovascular: Normal rate.   Respiratory: Effort normal.  GI: Soft. There is no tenderness.  Genitourinary:  Dilation: Fingertip Effacement (%): 50 Station: -3 Presentation: Vertex Exam by:: N.Frazier,CNM    Musculoskeletal: Normal range of motion.  Neurological: She is alert and oriented to person, place, and time.  Skin: Skin is warm and dry.  Psychiatric: She has a normal mood and affect.   EFM reactive, TOCO irreg  MAU Course  Procedures Results for orders placed during the hospital encounter of 02/19/13 (from the past 24 hour(s))  URINALYSIS, ROUTINE W REFLEX MICROSCOPIC     Status: None   Collection Time    02/19/13  8:12 PM      Result Value Range   Color, Urine YELLOW  YELLOW   APPearance CLEAR  CLEAR   Specific Gravity, Urine 1.010  1.005 - 1.030   pH 7.5  5.0 - 8.0   Glucose, UA NEGATIVE  NEGATIVE mg/dL   Hgb urine dipstick NEGATIVE  NEGATIVE   Bilirubin Urine NEGATIVE  NEGATIVE   Ketones, ur NEGATIVE  NEGATIVE mg/dL   Protein, ur NEGATIVE  NEGATIVE mg/dL   Urobilinogen, UA 0.2  0.0 -  1.0 mg/dL   Nitrite NEGATIVE  NEGATIVE   Leukocytes, UA NEGATIVE  NEGATIVE     Assessment and Plan   1. Preterm uterine contractions, third trimester   No evidence of labor, rare UCs at time of discharge F/U as scheduled or sooner as needed    Medication List    TAKE these medications       albuterol 108 (90 BASE) MCG/ACT inhaler  Commonly known as:  PROVENTIL HFA;VENTOLIN HFA  Inhale 2 puffs into the lungs every 4 (four) hours as needed for wheezing.     prenatal multivitamin Tabs  Take 1 tablet by mouth at bedtime.     triamcinolone ointment 0.1 %  Commonly known as:  KENALOG  Apply 1 application topically  daily. For eczema        Follow-up Information   Follow up with JACKSON-MOORE,LISA X. (as scheduled)         FRAZIER,NATALIE 02/19/2013, 9:06 PM

## 2013-02-28 ENCOUNTER — Encounter (HOSPITAL_COMMUNITY): Payer: Self-pay | Admitting: *Deleted

## 2013-02-28 ENCOUNTER — Inpatient Hospital Stay (HOSPITAL_COMMUNITY)
Admission: AD | Admit: 2013-02-28 | Discharge: 2013-02-28 | Disposition: A | Discharge status: Home / Self Care | Payer: Medicaid Other | Source: Ambulatory Visit | Attending: Obstetrics & Gynecology | Admitting: Obstetrics & Gynecology

## 2013-02-28 DIAGNOSIS — O4700 False labor before 37 completed weeks of gestation, unspecified trimester: Secondary | ICD-10-CM | POA: Insufficient documentation

## 2013-02-28 NOTE — MAU Note (Signed)
I've been contracting for an hour. Some white d/c earlier on Saturday but none now.

## 2013-03-03 ENCOUNTER — Encounter: Payer: Medicaid Other | Admitting: Obstetrics & Gynecology

## 2013-03-03 DIAGNOSIS — Z3400 Encounter for supervision of normal first pregnancy, unspecified trimester: Secondary | ICD-10-CM

## 2013-03-06 ENCOUNTER — Encounter: Payer: Self-pay | Admitting: *Deleted

## 2013-03-09 ENCOUNTER — Encounter: Payer: BC Managed Care – PPO | Admitting: Obstetrics & Gynecology

## 2013-03-09 DIAGNOSIS — Z3400 Encounter for supervision of normal first pregnancy, unspecified trimester: Secondary | ICD-10-CM

## 2013-03-14 ENCOUNTER — Encounter (HOSPITAL_COMMUNITY): Payer: Self-pay | Admitting: *Deleted

## 2013-03-14 ENCOUNTER — Inpatient Hospital Stay (HOSPITAL_COMMUNITY)
Admission: AD | Admit: 2013-03-14 | Discharge: 2013-03-14 | Disposition: A | Discharge status: Home / Self Care | Payer: Medicaid Other | Source: Ambulatory Visit | Attending: Obstetrics & Gynecology | Admitting: Obstetrics & Gynecology

## 2013-03-14 DIAGNOSIS — O479 False labor, unspecified: Secondary | ICD-10-CM | POA: Insufficient documentation

## 2013-03-14 NOTE — MAU Note (Signed)
Contractions since 1130pm

## 2013-03-17 ENCOUNTER — Telehealth: Payer: Self-pay | Admitting: *Deleted

## 2013-03-17 ENCOUNTER — Inpatient Hospital Stay (HOSPITAL_COMMUNITY)
Admission: AD | Admit: 2013-03-17 | Discharge: 2013-03-17 | Disposition: A | Discharge status: Home / Self Care | Payer: Medicaid Other | Source: Ambulatory Visit | Attending: Obstetrics & Gynecology | Admitting: Obstetrics & Gynecology

## 2013-03-17 ENCOUNTER — Encounter (HOSPITAL_COMMUNITY): Payer: Self-pay | Admitting: *Deleted

## 2013-03-17 DIAGNOSIS — O479 False labor, unspecified: Secondary | ICD-10-CM | POA: Insufficient documentation

## 2013-03-17 DIAGNOSIS — O368190 Decreased fetal movements, unspecified trimester, not applicable or unspecified: Secondary | ICD-10-CM

## 2013-03-17 DIAGNOSIS — O368130 Decreased fetal movements, third trimester, not applicable or unspecified: Secondary | ICD-10-CM

## 2013-03-17 NOTE — MAU Note (Signed)
Pt states she hasn't felt the baby move since 4am. But pt admits to feeling the baby move while in room

## 2013-03-17 NOTE — MAU Note (Signed)
Contractions, pressure 

## 2013-03-17 NOTE — MAU Note (Signed)
Pt states hasnt felt fetal movement since 4am. FHT's 126 in triage. Denies bleeding, notes increased vaginal discharge, denies gush of fluid.

## 2013-03-17 NOTE — MAU Note (Signed)
Pt states pain became worse at about 1900. Pt was evaluated earlier in  MAU ro decreased fetal movement

## 2013-03-17 NOTE — Telephone Encounter (Signed)
Pt reports that she has not felt her baby move today and she has an appointment tomorrow- told patient do not wait for appointment- to go to MAU now to be evaluated.

## 2013-03-17 NOTE — MAU Provider Note (Signed)
History     CSN: [id number]  Arrival date and time: 03/17/13 1432   None     Chief Complaint  Patient presents with  . Decreased Fetal Movement   HPI This is a 22 y.o. female at [redacted]w[redacted]d who presents with c/o no fetal movement since early morning. Denies leaking, bleeding or painful contractions. States pregnancy has been normal.   RN Note: Pt states hasnt felt fetal movement since 4am. FHT's 126 in triage. Denies bleeding, notes increased vaginal discharge, denies gush of fluid.        OB History   Grav Para Term Preterm Abortions TAB SAB Ect Mult Living   2    1  1    0      Past Medical History  Diagnosis Date  . History of gonorrhea     12/2008; 09/2010;   . History of chlamydia     12/2008; 03/09/2009  . Asthma     Past Surgical History  Procedure Laterality Date  . No past surgeries      Family History  Problem Relation Age of Onset  . Other Neg Hx   . Hypertension Mother     History  Substance Use Topics  . Smoking status: Former Smoker -- 0.25 packs/day  . Smokeless tobacco: Not on file  . Alcohol Use: No     Comment: Rare    Allergies:  Allergies  Allergen Reactions  . Latex Itching and Swelling    Prescriptions prior to admission  Medication Sig Dispense Refill  . acetaminophen (TYLENOL) 500 MG tablet Take 1,000 mg by mouth every 6 (six) hours as needed for pain.      Marland Kitchen albuterol (PROVENTIL HFA;VENTOLIN HFA) 108 (90 BASE) MCG/ACT inhaler Inhale 2 puffs into the lungs every 4 (four) hours as needed for wheezing.  1 Inhaler  0  . Prenatal Vit-Fe Fumarate-FA (PRENATAL MULTIVITAMIN) TABS Take 1 tablet by mouth at bedtime.       . triamcinolone ointment (KENALOG) 0.1 % Apply 1 application topically daily. For eczema        Review of Systems  Constitutional: Negative for fever and chills.  Gastrointestinal: Negative for nausea, vomiting, abdominal pain, diarrhea and constipation.       Decreased fetal movement  Genitourinary: Negative for  dysuria.  Neurological: Negative for dizziness.   Physical Exam   Blood pressure 113/60, pulse 87, temperature 97.7 F (36.5 C), temperature source Oral, resp. rate 16, height 5\' 4"  (1.626 m), weight 186 lb 4 oz (84.482 kg), last menstrual period 06/18/2012.  Physical Exam  Constitutional: She is oriented to person, place, and time. She appears well-developed and well-nourished. No distress.  Cardiovascular: Normal rate.   Respiratory: Effort normal.  GI: Soft. She exhibits no distension and no mass. There is no tenderness. There is no rebound and no guarding.  Genitourinary: Vagina normal and uterus normal. No vaginal discharge found.  Musculoskeletal: Normal range of motion.  Neurological: She is alert and oriented to person, place, and time.  Skin: Skin is warm and dry.  Psychiatric: She has a normal mood and affect.   Fetal heart rate reactive During the time she was here, she developed contractions every 2-3 minutes.  Did not feel them as very painful. Dilation: 1 Effacement (%): 50 Station: -3  MAU Course  Procedures  MDM Discussed fetal movement and signs of labor  Assessment and Plan  A:  SIUP at [redacted]w[redacted]d        Reassuring fetal heart rate tracing  Possible prodromal labor  P:  DIscharge home      Labor precautions      Fetal movement monitoring      Come back any time if needed  [facility] 03/17/2013, 3:25 PM

## 2013-03-18 ENCOUNTER — Encounter (HOSPITAL_COMMUNITY): Payer: Self-pay | Admitting: *Deleted

## 2013-03-18 ENCOUNTER — Ambulatory Visit (INDEPENDENT_AMBULATORY_CARE_PROVIDER_SITE_OTHER): Payer: BC Managed Care – PPO | Admitting: Obstetrics & Gynecology

## 2013-03-18 ENCOUNTER — Encounter: Payer: BC Managed Care – PPO | Admitting: Obstetrics & Gynecology

## 2013-03-18 ENCOUNTER — Inpatient Hospital Stay (HOSPITAL_COMMUNITY)
Admission: AD | Admit: 2013-03-18 | Discharge: 2013-03-21 | DRG: 775 | Disposition: A | Discharge status: Home / Self Care | Payer: Medicaid Other | Source: Ambulatory Visit | Attending: Obstetrics & Gynecology | Admitting: Obstetrics & Gynecology

## 2013-03-18 ENCOUNTER — Other Ambulatory Visit: Payer: BC Managed Care – PPO

## 2013-03-18 VITALS — BP 112/77 | Temp 97.2°F

## 2013-03-18 DIAGNOSIS — Z3483 Encounter for supervision of other normal pregnancy, third trimester: Secondary | ICD-10-CM

## 2013-03-18 DIAGNOSIS — O4700 False labor before 37 completed weeks of gestation, unspecified trimester: Secondary | ICD-10-CM

## 2013-03-18 DIAGNOSIS — Z3480 Encounter for supervision of other normal pregnancy, unspecified trimester: Secondary | ICD-10-CM

## 2013-03-18 MED ORDER — OXYCODONE-ACETAMINOPHEN 5-325 MG PO TABS
1.0000 | ORAL_TABLET | ORAL | Status: DC | PRN
  Filled 2013-03-18: qty 1
  Filled 2013-03-18: qty 2
  Filled 2013-03-18: qty 1

## 2013-03-18 MED ORDER — EPHEDRINE 5 MG/ML INJ
10.0000 mg | INTRAVENOUS | Status: DC | PRN
  Filled 2013-03-18: qty 2

## 2013-03-18 MED ORDER — OXYTOCIN BOLUS FROM INFUSION: 500.0000 mL | INTRAVENOUS | Status: DC

## 2013-03-18 MED ORDER — DIPHENHYDRAMINE HCL 50 MG/ML IJ SOLN: 12.5000 mg | INTRAMUSCULAR | Status: DC | PRN

## 2013-03-18 MED ORDER — IBUPROFEN 600 MG PO TABS
600.0000 mg | ORAL_TABLET | Freq: Four times a day (QID) | ORAL | Status: DC | PRN
  Filled 2013-03-18 (×7): qty 1

## 2013-03-18 MED ORDER — EPHEDRINE 5 MG/ML INJ
10.0000 mg | INTRAVENOUS | Status: DC | PRN
  Filled 2013-03-18: qty 2
  Filled 2013-03-18: qty 4

## 2013-03-18 MED ORDER — FENTANYL 2.5 MCG/ML BUPIVACAINE 1/10 % EPIDURAL INFUSION (WH - ANES)
14.0000 mL/h | INTRAMUSCULAR | Status: DC | PRN
  Administered 2013-03-19: 14 mL/h via EPIDURAL
  Filled 2013-03-18: qty 125

## 2013-03-18 MED ORDER — LACTATED RINGERS IV SOLN: 500.0000 mL | INTRAVENOUS | Status: DC | PRN

## 2013-03-18 MED ORDER — LACTATED RINGERS IV SOLN
500.0000 mL | Freq: Once | INTRAVENOUS | Status: AC
  Administered 2013-03-19: 500 mL via INTRAVENOUS

## 2013-03-18 MED ORDER — OXYTOCIN 40 UNITS IN LACTATED RINGERS INFUSION - SIMPLE MED
62.5000 mL/h | INTRAVENOUS | Status: DC
  Administered 2013-03-19: 999 mL/h via INTRAVENOUS
  Filled 2013-03-18: qty 1000

## 2013-03-18 MED ORDER — ONDANSETRON HCL 4 MG/2ML IJ SOLN: 4.0000 mg | Freq: Four times a day (QID) | INTRAMUSCULAR | Status: DC | PRN

## 2013-03-18 MED ORDER — ACETAMINOPHEN 325 MG PO TABS: 650.0000 mg | ORAL_TABLET | ORAL | Status: DC | PRN

## 2013-03-18 MED ORDER — CITRIC ACID-SODIUM CITRATE 334-500 MG/5ML PO SOLN: 30.0000 mL | ORAL | Status: DC | PRN

## 2013-03-18 MED ORDER — PHENYLEPHRINE 40 MCG/ML (10ML) SYRINGE FOR IV PUSH (FOR BLOOD PRESSURE SUPPORT)
80.0000 ug | PREFILLED_SYRINGE | INTRAVENOUS | Status: DC | PRN
  Filled 2013-03-18: qty 2
  Filled 2013-03-18: qty 5

## 2013-03-18 MED ORDER — LACTATED RINGERS IV SOLN
INTRAVENOUS | Status: DC
  Administered 2013-03-19 (×2): via INTRAVENOUS

## 2013-03-18 MED ORDER — BUTORPHANOL TARTRATE 1 MG/ML IJ SOLN
1.0000 mg | INTRAMUSCULAR | Status: DC | PRN
  Administered 2013-03-19 (×2): 1 mg via INTRAVENOUS
  Filled 2013-03-18 (×2): qty 1

## 2013-03-18 MED ORDER — LIDOCAINE HCL (PF) 1 % IJ SOLN
30.0000 mL | INTRAMUSCULAR | Status: DC | PRN
  Filled 2013-03-18 (×2): qty 30

## 2013-03-18 MED ORDER — PHENYLEPHRINE 40 MCG/ML (10ML) SYRINGE FOR IV PUSH (FOR BLOOD PRESSURE SUPPORT)
80.0000 ug | PREFILLED_SYRINGE | INTRAVENOUS | Status: DC | PRN
  Filled 2013-03-18: qty 2

## 2013-03-18 MED ORDER — FLEET ENEMA 7-19 GM/118ML RE ENEM: 1.0000 | ENEMA | RECTAL | Status: DC | PRN

## 2013-03-18 NOTE — Progress Notes (Signed)
Pulse-90 Pt c/o UC x 16 hours every 3 to 5 minutes. Pt was seen at [facility] and 1 1/2 to 2 cm dilation.

## 2013-03-18 NOTE — MAU Note (Signed)
Pt states she had an appointment in office and pt states she has had increased contraction rate and intensity

## 2013-03-19 ENCOUNTER — Encounter (HOSPITAL_COMMUNITY): Payer: Self-pay | Admitting: *Deleted

## 2013-03-19 ENCOUNTER — Encounter (HOSPITAL_COMMUNITY): Payer: Self-pay | Admitting: Anesthesiology

## 2013-03-19 ENCOUNTER — Inpatient Hospital Stay (HOSPITAL_COMMUNITY): Payer: Medicaid Other | Admitting: Anesthesiology

## 2013-03-19 LAB — CBC
HCT: 36.1 % (ref 36.0–46.0)
MCHC: 34.1 g/dL (ref 30.0–36.0)
RDW: 13.1 % (ref 11.5–15.5)

## 2013-03-19 MED ORDER — ALBUTEROL SULFATE HFA 108 (90 BASE) MCG/ACT IN AERS
2.0000 | INHALATION_SPRAY | RESPIRATORY_TRACT | Status: DC | PRN
  Filled 2013-03-19: qty 6.7

## 2013-03-19 MED ORDER — OXYCODONE-ACETAMINOPHEN 5-325 MG PO TABS
1.0000 | ORAL_TABLET | ORAL | Status: DC | PRN
  Administered 2013-03-19 (×2): 1 via ORAL
  Administered 2013-03-20 (×3): 2 via ORAL
  Administered 2013-03-21: 1 via ORAL
  Administered 2013-03-21: 2 via ORAL
  Filled 2013-03-19 (×2): qty 2
  Filled 2013-03-19: qty 1
  Filled 2013-03-19: qty 2

## 2013-03-19 MED ORDER — LANOLIN HYDROUS EX OINT: TOPICAL_OINTMENT | CUTANEOUS | Status: DC | PRN

## 2013-03-19 MED ORDER — SODIUM BICARBONATE 8.4 % IV SOLN
INTRAVENOUS | Status: DC | PRN
  Administered 2013-03-19: 5 mL via EPIDURAL

## 2013-03-19 MED ORDER — DIBUCAINE 1 % RE OINT: 1.0000 "application " | TOPICAL_OINTMENT | RECTAL | Status: DC | PRN

## 2013-03-19 MED ORDER — DIPHENHYDRAMINE HCL 25 MG PO CAPS: 25.0000 mg | ORAL_CAPSULE | Freq: Four times a day (QID) | ORAL | Status: DC | PRN

## 2013-03-19 MED ORDER — WITCH HAZEL-GLYCERIN EX PADS: 1.0000 "application " | MEDICATED_PAD | CUTANEOUS | Status: DC | PRN

## 2013-03-19 MED ORDER — FERROUS SULFATE 325 (65 FE) MG PO TABS
325.0000 mg | ORAL_TABLET | Freq: Two times a day (BID) | ORAL | Status: DC
  Administered 2013-03-19 – 2013-03-21 (×4): 325 mg via ORAL
  Filled 2013-03-19 (×4): qty 1

## 2013-03-19 MED ORDER — ONDANSETRON HCL 4 MG/2ML IJ SOLN: 4.0000 mg | INTRAMUSCULAR | Status: DC | PRN

## 2013-03-19 MED ORDER — IBUPROFEN 600 MG PO TABS
600.0000 mg | ORAL_TABLET | Freq: Four times a day (QID) | ORAL | Status: DC
  Administered 2013-03-19 – 2013-03-21 (×9): 600 mg via ORAL
  Filled 2013-03-19 (×2): qty 1

## 2013-03-19 MED ORDER — BENZOCAINE-MENTHOL 20-0.5 % EX AERO
1.0000 "application " | INHALATION_SPRAY | CUTANEOUS | Status: DC | PRN
  Administered 2013-03-19: 1 via TOPICAL
  Filled 2013-03-19: qty 56

## 2013-03-19 MED ORDER — TETANUS-DIPHTH-ACELL PERTUSSIS 5-2.5-18.5 LF-MCG/0.5 IM SUSP: 0.5000 mL | Freq: Once | INTRAMUSCULAR | Status: DC

## 2013-03-19 MED ORDER — ZOLPIDEM TARTRATE 5 MG PO TABS: 5.0000 mg | ORAL_TABLET | Freq: Every evening | ORAL | Status: DC | PRN

## 2013-03-19 MED ORDER — PRENATAL MULTIVITAMIN CH
1.0000 | ORAL_TABLET | Freq: Every day | ORAL | Status: DC
  Administered 2013-03-19 – 2013-03-21 (×3): 1 via ORAL
  Filled 2013-03-19 (×3): qty 1

## 2013-03-19 MED ORDER — SENNOSIDES-DOCUSATE SODIUM 8.6-50 MG PO TABS
2.0000 | ORAL_TABLET | Freq: Every day | ORAL | Status: DC
  Administered 2013-03-19 – 2013-03-20 (×2): 2 via ORAL

## 2013-03-19 MED ORDER — ONDANSETRON HCL 4 MG PO TABS: 4.0000 mg | ORAL_TABLET | ORAL | Status: DC | PRN

## 2013-03-19 MED ORDER — SIMETHICONE 80 MG PO CHEW: 80.0000 mg | CHEWABLE_TABLET | ORAL | Status: DC | PRN

## 2013-03-19 NOTE — Progress Notes (Signed)
UR chart review completed.  

## 2013-03-19 NOTE — Progress Notes (Signed)
Subjective: Postpartum Day 1: Cesarean Delivery Patient reports tolerating PO, + flatus and no problems voiding.    Objective: Vital signs in last 24 hours: Temp:  [97.9 F (36.6 C)-98.6 F (37 C)] 98.1 F (36.7 C) (04/04 1315) Pulse Rate:  [86-157] 90 (04/04 1315) Resp:  [18-20] 20 (04/04 1315) BP: (84-135)/(43-89) 131/66 mmHg (04/04 1315) SpO2:  [98 %-99 %] 98 % (04/04 0306) Weight:  [186 lb (84.369 kg)] 186 lb (84.369 kg) (04/04 0015)  Physical Exam:  General: alert and no distress Lochia: appropriate Uterine Fundus: firm Incision: healing well DVT Evaluation: No evidence of DVT seen on physical exam.   Recent Labs  03/19/13 0015  HGB 12.3  HCT 36.1    Assessment/Plan: Status post Cesarean section. Doing well postoperatively.  Continue current care.  HARPER,CHARLES A 03/19/2013, 3:08 PM

## 2013-03-19 NOTE — Anesthesia Preprocedure Evaluation (Signed)
Anesthesia Evaluation  Patient identified by MRN, date of birth, ID band Patient awake    Reviewed: Allergy & Precautions, H&P , Patient's Chart, lab work & pertinent test results  Airway Mallampati: II  TM Distance: >3 FB Neck ROM: full    Dental  (+) Teeth Intact   Pulmonary asthma ,    breath sounds clear to auscultation       Cardiovascular  Rhythm:regular Rate:Normal     Neuro/Psych    GI/Hepatic   Endo/Other    Renal/GU      Musculoskeletal   Abdominal   Peds  Hematology   Anesthesia Other Findings       Reproductive/Obstetrics (+) Pregnancy                             Anesthesia Physical Anesthesia Plan  ASA: II  Anesthesia Plan: Epidural   Post-op Pain Management:    Induction:   Airway Management Planned:   Additional Equipment:   Intra-op Plan:   Post-operative Plan:   Informed Consent: I have reviewed the patients History and Physical, chart, labs and discussed the procedure including the risks, benefits and alternatives for the proposed anesthesia with the patient or authorized representative who has indicated his/her understanding and acceptance.   Dental Advisory Given  Plan Discussed with:   Anesthesia Plan Comments: (Labs checked- platelets confirmed with RN in room. Fetal heart tracing, per RN, reported to be stable enough for sitting procedure. Discussed epidural, and patient consents to the procedure:  included risk of possible headache,backache, failed block, allergic reaction, and nerve injury. This patient was asked if she had any questions or concerns before the procedure started.)        Anesthesia Quick Evaluation  

## 2013-03-19 NOTE — Progress Notes (Signed)
Pt states baby does not want to breast feed, encourage pt.  Pt states she wants formula for the baby

## 2013-03-19 NOTE — Anesthesia Procedure Notes (Signed)

## 2013-03-19 NOTE — H&P (Signed)
This is Dr. Francoise Ceo dictating the history and physical on  Lauren Conway she's a 22 year old gravida 2 para 0010 at 54 weeks in the day [facility] 03/25/2013 negative GBS admitted in labor she has no 4-5 cm 90% vertex -1 station amniotomy performed the fluids clear she is contracting every 3-4 minutes Past medical history negative Past surgical history negative Social history negative System review noncontributory Physical exam well-developed female in labor HEENT negative Heart regular rhythm no murmurs no gallops Breasts negative Lungs clear to P&A Abdomen clear and term estimated fetal weight 6 lbs. 12 oz. Extremities as stated above

## 2013-03-20 LAB — CBC
HCT: 27.2 % — ABNORMAL LOW (ref 36.0–46.0)
Hemoglobin: 9.1 g/dL — ABNORMAL LOW (ref 12.0–15.0)
MCV: 90.4 fL (ref 78.0–100.0)
WBC: 12.5 10*3/uL — ABNORMAL HIGH (ref 4.0–10.5)

## 2013-03-20 NOTE — Progress Notes (Signed)
Post Partum Day 1 Subjective: no complaints  Objective: Blood pressure 97/62, pulse 83, temperature 98.1 F (36.7 C), temperature source Oral, resp. rate 18, height 5\' 4"  (1.626 m), weight 186 lb (84.369 kg), last menstrual period 06/18/2012, SpO2 98.00%, unknown if currently breastfeeding.  Physical Exam:  General: alert and no distress Lochia: appropriate Uterine Fundus: firm Incision: none DVT Evaluation: No evidence of DVT seen on physical exam.   Recent Labs  03/19/13 0015 03/20/13 0605  HGB 12.3 9.1*  HCT 36.1 27.2*    Assessment/Plan: Plan for discharge tomorrow   LOS: 2 days   Lauren Conway A 03/20/2013, 4:52 PM

## 2013-03-21 ENCOUNTER — Ambulatory Visit: Payer: Self-pay

## 2013-03-21 MED ORDER — OXYCODONE-ACETAMINOPHEN 5-325 MG PO TABS: 1.0000 | ORAL_TABLET | ORAL | Status: DC | PRN

## 2013-03-21 MED ORDER — IBUPROFEN 600 MG PO TABS: 600.0000 mg | ORAL_TABLET | Freq: Four times a day (QID) | ORAL | Status: DC | PRN

## 2013-03-21 MED ORDER — FUSION PLUS PO CAPS: 1.0000 | ORAL_CAPSULE | Freq: Every day | ORAL | Status: DC

## 2013-03-21 NOTE — Lactation Note (Signed)
This note was copied from the chart of Girl Minsa Weddington. Lactation Consultation Note  Patient Name: Girl Shifra Swartzentruber JYNWG'N Date: 03/21/2013 Reason for consult: Initial assessment Mother has been formula feeding. Her milk is coming in and her breast are full, heavy and leaking. She has requested assistance with the LC to start breastfeeding. Mother is 22 yo and her mother is present and support. Baby had recently fed with 20 ml of formula. She showed some feeding cues but mostly calm and content. Baby latched well and sucked for 5 minutes. Baby was taken off breast so mother could learn how to feed her baby. Breast was compressible around the areola. Patient has fullness in the axillary of the right arm. explained this was a collection of milk that will not drain through the breast and she will need to place ice packs intermittantly until milk dries up. Discussed supply and demand, engorgement management and outpatient lactation services for assistance afterdischarge. Mother was given a hand pump and she expressed milk to relieve pressure in the breast.  Maternal Data Has patient been taught Hand Expression?: Yes  Feeding Feeding Type: Breast Milk Feeding method: Breast Length of feed: 5 min  LATCH Score/Interventions Latch: Grasps breast easily, tongue down, lips flanged, rhythmical sucking. Intervention(s): Adjust position;Assist with latch  Audible Swallowing: Spontaneous and intermittent Intervention(s): Skin to skin;Hand expression  Type of Nipple: Flat  Comfort (Breast/Nipple): Soft / non-tender     Hold (Positioning): Assistance needed to correctly position infant at breast and maintain latch.  LATCH Score: 8  Lactation Tools Discussed/Used     Consult Status Consult Status: Complete    Omar Person 03/21/2013, 2:52 PM

## 2013-03-21 NOTE — Progress Notes (Signed)
Post Partum Day 2 Subjective: no complaints  Objective: Blood pressure 111/63, pulse 77, temperature 98 F (36.7 C), temperature source Oral, resp. rate 18, height 5\' 4"  (1.626 m), weight 186 lb (84.369 kg), last menstrual period 06/18/2012, SpO2 96.00%, unknown if currently breastfeeding.  Physical Exam:  General: alert and no distress Lochia: appropriate Uterine Fundus: firm Incision: none DVT Evaluation: No evidence of DVT seen on physical exam.   Recent Labs  03/19/13 0015 03/20/13 0605  HGB 12.3 9.1*  HCT 36.1 27.2*    Assessment/Plan: Discharge home   LOS: 3 days   Arsal Tappan A 03/21/2013, 6:12 AM

## 2013-03-22 ENCOUNTER — Encounter: Payer: Self-pay | Admitting: Obstetrics & Gynecology

## 2013-03-22 NOTE — Patient Instructions (Signed)
Labor instructions.

## 2013-03-22 NOTE — Progress Notes (Signed)
Irregular UC's.

## 2013-03-25 NOTE — Discharge Summary (Signed)
Obstetric Discharge Summary Reason for Admission: onset of labor Prenatal Procedures: ultrasound Intrapartum Procedures: spontaneous vaginal delivery Postpartum Procedures: none Complications-Operative and Postpartum: none Hemoglobin  Date Value Range Status  03/20/2013 9.1* 12.0 - 15.0 g/dL Final     DELTA CHECK NOTED     REPEATED TO VERIFY     HCT  Date Value Range Status  03/20/2013 27.2* 36.0 - 46.0 % Final    Physical Exam:  General: alert and no distress Lochia: appropriate Uterine Fundus: firm Incision: none DVT Evaluation: No evidence of DVT seen on physical exam.  Discharge Diagnoses: Term Pregnancy-delivered  Discharge Information: Date: 03/25/2013 Activity: pelvic rest Diet: routine Medications: PNV, Ibuprofen and Percocet Condition: stable Instructions: refer to practice specific booklet Discharge to: home Follow-up Information   Follow up with Queen Abbett A, MD In 6 weeks.   Contact information:   [address] [place] [address] [phone]       Newborn Data: Live born female  Birth Weight: 6 lb 5.6 oz (2880 g) APGAR: 8, 9  Home with mother.  Lucine Bilski A 03/25/2013, 1:57 PM

## 2013-04-01 ENCOUNTER — Encounter: Payer: Self-pay | Admitting: Obstetrics

## 2013-04-01 ENCOUNTER — Ambulatory Visit (INDEPENDENT_AMBULATORY_CARE_PROVIDER_SITE_OTHER): Payer: BC Managed Care – PPO | Admitting: Obstetrics

## 2013-04-01 VITALS — BP 119/73 | HR 105 | Temp 98.0°F | Ht 64.0 in | Wt 167.0 lb

## 2013-04-01 DIAGNOSIS — Z3009 Encounter for other general counseling and advice on contraception: Secondary | ICD-10-CM

## 2013-04-01 MED ORDER — MEDROXYPROGESTERONE ACETATE 150 MG/ML IM SUSP: 150.0000 mg | INTRAMUSCULAR | Status: DC

## 2013-04-01 NOTE — Progress Notes (Signed)
Subjective:     Lauren Conway is a 22 y.o. female who presents for a postpartum visit. She is 2 week postpartum following a spontaneous vaginal delivery. I have fully reviewed the prenatal and intrapartum course. The delivery was at 39.1 gestational weeks. Outcome: spontaneous vaginal delivery. Anesthesia: epidural. Postpartum course has been normal. Baby's course has been normal. Baby is feeding by bottle - Lucien Mons Start. Bleeding brown. Bowel function is normal. Bladder function is normal. Patient is not sexually active. Contraception method is abstinence. Postpartum depression screening: positive.  The following portions of the patient's history were reviewed and updated as appropriate: allergies, current medications, past family history, past medical history, past social history, past surgical history and problem list.  Review of Systems Pertinent items are noted in HPI.   Objective:    BP 119/73  Pulse 105  Temp(Src) 98 F (36.7 C)  Ht 5\' 4"  (1.626 m)  Wt 167 lb (75.751 kg)  BMI 28.65 kg/m2  LMP 06/18/2012  Breastfeeding? No  General:  alert and no distress   Breasts:  inspection negative, no nipple discharge or bleeding, no masses or nodularity palpable  Lungs: not examined  Heart:  not examined  Abdomen: normal findings: soft, non-tender   Vulva:  normal  Vagina: normal vagina  Cervix:  no lesions  Corpus: normal size, contour, position, consistency, mobility, non-tender  Adnexa:  no mass, fullness, tenderness  Rectal Exam: Not performed.        Assessment:     Normal postpartum exam. Pap smear not done at today's visit.   Plan:    1. Contraception: none 2. Contraception options discussed 3. Follow up in: 6 weeks or as needed.

## 2013-04-03 NOTE — Patient Instructions (Signed)
Contraception options. 

## 2013-04-05 ENCOUNTER — Other Ambulatory Visit: Payer: Self-pay | Admitting: Obstetrics

## 2013-04-05 ENCOUNTER — Encounter: Payer: Self-pay | Admitting: *Deleted

## 2013-04-05 ENCOUNTER — Ambulatory Visit (INDEPENDENT_AMBULATORY_CARE_PROVIDER_SITE_OTHER): Payer: BC Managed Care – PPO | Admitting: *Deleted

## 2013-04-05 ENCOUNTER — Encounter: Payer: BC Managed Care – PPO | Admitting: *Deleted

## 2013-04-05 VITALS — BP 117/79 | HR 70 | Temp 98.0°F | Wt 169.6 lb

## 2013-04-05 DIAGNOSIS — Z3202 Encounter for pregnancy test, result negative: Secondary | ICD-10-CM

## 2013-04-05 DIAGNOSIS — Z3049 Encounter for surveillance of other contraceptives: Secondary | ICD-10-CM

## 2013-04-05 MED ORDER — MEDROXYPROGESTERONE ACETATE 150 MG/ML IM SUSP
150.0000 mg | Freq: Once | INTRAMUSCULAR | Status: AC
  Administered 2013-04-05: 150 mg via INTRAMUSCULAR

## 2013-04-05 NOTE — Progress Notes (Signed)
Patient here today for depo provera injection.  Medroxyprogesterone 150mg  given. Patient tolerated injection with no problems noted. She was advised to return 06/27/2013 for next injection. Depo Provera protocol reviewed with patient. Patient has verbalized understanding. Questions were asked and answered.

## 2013-04-05 NOTE — Telephone Encounter (Signed)
Please Respond 

## 2013-04-06 ENCOUNTER — Ambulatory Visit: Payer: BC Managed Care – PPO

## 2013-04-06 NOTE — Progress Notes (Signed)
This encounter was created in error - please disregard.

## 2013-04-07 NOTE — Telephone Encounter (Signed)
This is already done.

## 2013-05-07 ENCOUNTER — Inpatient Hospital Stay (HOSPITAL_COMMUNITY)
Admission: AD | Admit: 2013-05-07 | Discharge: 2013-05-07 | Disposition: A | Discharge status: Home / Self Care | Payer: BC Managed Care – PPO | Source: Ambulatory Visit | Attending: Obstetrics | Admitting: Obstetrics

## 2013-05-07 ENCOUNTER — Encounter (HOSPITAL_COMMUNITY): Payer: Self-pay | Admitting: Family

## 2013-05-07 DIAGNOSIS — Z202 Contact with and (suspected) exposure to infections with a predominantly sexual mode of transmission: Secondary | ICD-10-CM | POA: Insufficient documentation

## 2013-05-07 DIAGNOSIS — Z9189 Other specified personal risk factors, not elsewhere classified: Secondary | ICD-10-CM

## 2013-05-07 LAB — WET PREP, GENITAL
Trich, Wet Prep: NONE SEEN
Yeast Wet Prep HPF POC: NONE SEEN

## 2013-05-07 MED ORDER — AZITHROMYCIN 250 MG PO TABS
1000.0000 mg | ORAL_TABLET | Freq: Once | ORAL | Status: AC
  Administered 2013-05-07: 1000 mg via ORAL
  Filled 2013-05-07: qty 4

## 2013-05-07 MED ORDER — CEFTRIAXONE SODIUM 250 MG IJ SOLR
250.0000 mg | Freq: Once | INTRAMUSCULAR | Status: AC
  Administered 2013-05-07: 250 mg via INTRAMUSCULAR
  Filled 2013-05-07: qty 250

## 2013-05-07 NOTE — MAU Provider Note (Signed)
History     CSN: [id number]  Arrival date and time: 05/07/13 1478   First Provider Initiated Contact with Patient 05/07/13 1907      Chief Complaint  Patient presents with  . Exposure to STD   HPI Ms. Mahogany Torrance is a 22 y.o. G2P1011 who presents to MAU today for STD check after possible STD exposure. Partner told her he was having symptoms, but has not yet been tested. Patient denies vaginal discharge, bleeding, fever, N/V or UTI symptoms. She states mild, occasional RLQ pain, but none now.   OB History   Grav Para Term Preterm Abortions TAB SAB Ect Mult Living   2 1 1  1  1   1       Past Medical History  Diagnosis Date  . History of gonorrhea     12/2008; 09/2010;   . History of chlamydia     12/2008; 03/09/2009  . Asthma     Past Surgical History  Procedure Laterality Date  . No past surgeries      Family History  Problem Relation Age of Onset  . Other Neg Hx   . Hypertension Mother   . Asthma Maternal Grandmother     History  Substance Use Topics  . Smoking status: Former Smoker -- 0.25 packs/day  . Smokeless tobacco: Never Used  . Alcohol Use: No     Comment: Rare    Allergies:  Allergies  Allergen Reactions  . Latex Itching and Swelling    Prescriptions prior to admission  Medication Sig Dispense Refill  . ibuprofen (ADVIL,MOTRIN) 600 MG tablet Take 1 tablet (600 mg total) by mouth every 6 (six) hours as needed (pain scale < 4).  30 tablet  5  . Iron-FA-B Cmp-C-Biot-Probiotic (FUSION PLUS) CAPS Take 1 capsule by mouth daily before breakfast.  30 capsule  5  . medroxyPROGESTERone (DEPO-PROVERA) 150 MG/ML injection Inject 1 mL (150 mg total) into the muscle every 3 (three) months.  1 mL  0  . oxyCODONE-acetaminophen (PERCOCET/ROXICET) 5-325 MG per tablet Take 1-2 tablets by mouth every 4 (four) hours as needed.  40 tablet  0  . PROVENTIL HFA 108 (90 BASE) MCG/ACT inhaler INHALE 2 PUFFS EVERY 4 HOURS AS NEEDED FOR WHEEZING  6.7 each  0     Review of Systems  Constitutional: Negative for fever, chills and malaise/fatigue.  Gastrointestinal: Positive for abdominal pain. Negative for nausea and vomiting.  Genitourinary: Negative for dysuria, urgency and frequency.       Neg - vaginal discharge, bleeding   Physical Exam   Blood pressure 122/79, pulse 95, temperature 99 F (37.2 C), temperature source Oral, resp. rate 16, height 5\' 3"  (1.6 m), weight 161 lb 9.6 oz (73.301 kg), SpO2 100.00%.  Physical Exam  Constitutional: She is oriented to person, place, and time. She appears well-developed and well-nourished. No distress.  HENT:  Head: Normocephalic and atraumatic.  Cardiovascular: Normal rate.   Respiratory: Effort normal.  GI: Soft. She exhibits no distension and no mass. There is no tenderness. There is no rebound and no guarding.  Genitourinary: Uterus is not enlarged and not tender. Cervix exhibits friability. Cervix exhibits no motion tenderness and no discharge. Right adnexum displays no mass and no tenderness. Left adnexum displays no mass and no tenderness. No bleeding around the vagina. Vaginal discharge (scant white thin discharge noted) found.  Neurological: She is alert and oriented to person, place, and time.  Skin: Skin is warm and dry. No erythema.  Psychiatric:  She has a normal mood and affect.   Results for orders placed during the hospital encounter of 05/07/13 (from the past 24 hour(s))  WET PREP, GENITAL     Status: Abnormal   Collection Time    05/07/13  7:15 PM      Result Value Range   Yeast Wet Prep HPF POC NONE SEEN  NONE SEEN   Trich, Wet Prep NONE SEEN  NONE SEEN   Clue Cells Wet Prep HPF POC NONE SEEN  NONE SEEN   WBC, Wet Prep HPF POC FEW (*) NONE SEEN    MAU Course  Procedures None  MDM Wet prep and GC/Chlamydia today Patient requests prophylactic GC/CT treatment because of possibility of exposure IM Rocephin and PO Zithromax given in MAU  Assessment and Plan  A: Possible  STD exposure  P: Discharge home Patient will be contacted with positive results Patient encouraged to follow-up with PCP  Freddi Starr, PA-C  05/07/2013, 7:07 PM

## 2013-05-07 NOTE — MAU Note (Signed)
Patient had a vaginal delivery on 4-4. Received Depo at 2 weeks postpartum and has not had a period. States the FOB told her he might have an STI with some symptoms. Patient denies symptoms but wants to be tested.

## 2013-05-07 NOTE — MAU Note (Signed)
Patient presents to MAU with c/o possible STI exposure. Reports vaginal delivery on 03/19/13 and had Deposhot two weeks after delivery. Reports no vaginal bleeding since 5/13.  Denies vaginal discharge or cramping.

## 2013-05-08 LAB — GC/CHLAMYDIA PROBE AMP: CT Probe RNA: NEGATIVE

## 2013-06-28 ENCOUNTER — Ambulatory Visit: Payer: BC Managed Care – PPO | Admitting: *Deleted

## 2013-06-28 VITALS — BP 117/83 | HR 85 | Wt 170.0 lb

## 2013-06-28 DIAGNOSIS — Z3009 Encounter for other general counseling and advice on contraception: Secondary | ICD-10-CM

## 2013-06-28 MED ORDER — MEDROXYPROGESTERONE ACETATE 150 MG/ML IM SUSP: 150.0000 mg | INTRAMUSCULAR | Status: DC

## 2013-06-29 ENCOUNTER — Ambulatory Visit (INDEPENDENT_AMBULATORY_CARE_PROVIDER_SITE_OTHER): Payer: Medicaid Other | Admitting: *Deleted

## 2013-06-29 VITALS — BP 128/77 | HR 108 | Wt 170.0 lb

## 2013-06-29 DIAGNOSIS — Z3049 Encounter for surveillance of other contraceptives: Secondary | ICD-10-CM | POA: Diagnosis not present

## 2013-06-29 MED ORDER — MEDROXYPROGESTERONE ACETATE 150 MG/ML IM SUSP
150.0000 mg | INTRAMUSCULAR | Status: DC
  Administered 2013-06-29: 150 mg via INTRAMUSCULAR

## 2013-06-29 NOTE — Progress Notes (Signed)
Pt in office today for a Depo Injection.

## 2013-06-29 NOTE — Progress Notes (Signed)
Pt did not have injection with her and will return tomorrow for injection

## 2013-07-26 ENCOUNTER — Other Ambulatory Visit: Payer: Self-pay | Admitting: *Deleted

## 2013-07-26 DIAGNOSIS — N939 Abnormal uterine and vaginal bleeding, unspecified: Secondary | ICD-10-CM

## 2013-07-26 MED ORDER — LEVONORGESTREL-ETHINYL ESTRAD 0.15-30 MG-MCG PO TABS: 1.0000 | ORAL_TABLET | Freq: Every day | ORAL | Status: DC

## 2013-07-27 ENCOUNTER — Encounter: Payer: Self-pay | Admitting: Obstetrics

## 2013-09-20 ENCOUNTER — Ambulatory Visit: Payer: BC Managed Care – PPO

## 2013-09-22 ENCOUNTER — Encounter: Payer: Self-pay | Admitting: Obstetrics

## 2014-01-13 ENCOUNTER — Encounter: Payer: BC Managed Care – PPO | Admitting: Gynecology

## 2014-03-25 ENCOUNTER — Encounter (HOSPITAL_COMMUNITY): Payer: Self-pay | Admitting: *Deleted

## 2014-03-25 ENCOUNTER — Inpatient Hospital Stay (HOSPITAL_COMMUNITY)
Admission: AD | Admit: 2014-03-25 | Discharge: 2014-03-25 | Disposition: A | Discharge status: Home / Self Care | Payer: Medicaid Other | Source: Ambulatory Visit | Attending: Obstetrics | Admitting: Obstetrics

## 2014-03-25 DIAGNOSIS — L02419 Cutaneous abscess of limb, unspecified: Secondary | ICD-10-CM

## 2014-03-25 DIAGNOSIS — M79609 Pain in unspecified limb: Secondary | ICD-10-CM | POA: Insufficient documentation

## 2014-03-25 DIAGNOSIS — R109 Unspecified abdominal pain: Secondary | ICD-10-CM | POA: Insufficient documentation

## 2014-03-25 DIAGNOSIS — IMO0002 Reserved for concepts with insufficient information to code with codable children: Secondary | ICD-10-CM

## 2014-03-25 DIAGNOSIS — N898 Other specified noninflammatory disorders of vagina: Secondary | ICD-10-CM | POA: Insufficient documentation

## 2014-03-25 DIAGNOSIS — Z87891 Personal history of nicotine dependence: Secondary | ICD-10-CM | POA: Insufficient documentation

## 2014-03-25 DIAGNOSIS — Z113 Encounter for screening for infections with a predominantly sexual mode of transmission: Secondary | ICD-10-CM | POA: Insufficient documentation

## 2014-03-25 LAB — WET PREP, GENITAL
Clue Cells Wet Prep HPF POC: NONE SEEN
Trich, Wet Prep: NONE SEEN
YEAST WET PREP: NONE SEEN

## 2014-03-25 LAB — URINALYSIS, ROUTINE W REFLEX MICROSCOPIC
BILIRUBIN URINE: NEGATIVE
Glucose, UA: NEGATIVE mg/dL
Hgb urine dipstick: NEGATIVE
Ketones, ur: NEGATIVE mg/dL
Leukocytes, UA: NEGATIVE
Nitrite: NEGATIVE
Protein, ur: NEGATIVE mg/dL
Specific Gravity, Urine: >1.03 — ABNORMAL HIGH (ref 1.005–1.030)
UROBILINOGEN UA: 0.2 mg/dL (ref 0.0–1.0)
pH: 6 (ref 5.0–8.0)

## 2014-03-25 LAB — POCT PREGNANCY, URINE: Preg Test, Ur: NEGATIVE

## 2014-03-25 MED ORDER — TRAMADOL HCL 50 MG PO TABS: 50.0000 mg | ORAL_TABLET | Freq: Four times a day (QID) | ORAL | Status: DC | PRN

## 2014-03-25 MED ORDER — CEPHALEXIN 500 MG PO CAPS: 500.0000 mg | ORAL_CAPSULE | Freq: Three times a day (TID) | ORAL | Status: DC

## 2014-03-25 NOTE — MAU Note (Signed)
Pt reports for a couple of months she has had a lump under her left arm and it is very sore to touch. States several years ago she was seeing a gynecologist and was having pain under her rt arm and was told it was "excess breast tissue" and it has been hurting her again. Also reports she wants to be seen for sharp lower abd pain that comes and goes about 3 times a week.

## 2014-03-25 NOTE — MAU Note (Signed)
PT SAYS SHE HAS A LUMP UNDER ARMPIT- LEFT SIDE-   IN RM 6-   LUMP IS VISIBLE-   HAS BEEN THERE SINCE DEC.   WHEN SHE PRESSES ON IT- PAIN.    SAYS IT HAS NOT COME  TO HEAD-  NO DRAINAGE.    HAS NOT EVER  HAD ONE ON LEFT BEFORE-  BUT  SHE HAD  A LUMP ON RIGHT ARMPIT  WHEN  SHE WAS PREG.     LAST SEEN IN  OFFICE- DR HARPER- IN July-  GOT DEPO  .     NO BIRTH CONTROL.  LAST SEX-     Tuesday  NIGHT .  SAYS BOTH BREAST SORE-  FIRST NOTICED  IN DEC- JAN.     SAYS HAS ABD PAIN- STARTED IN FEB-  SHE WAS DATING  SOMEONE IN [place]-    SHE WANTS TO BE CHECKED FOR STD'S.   .Marland Kitchen

## 2014-03-25 NOTE — MAU Provider Note (Signed)
History     CSN: [id number]  Arrival date and time: [id number]/10/15 0101   First Provider Initiated Contact with Patient 03/25/14 0206      Chief Complaint  Patient presents with  . Abdominal Pain   HPI Comments: Deri FuellingJatavia Colegrove 23 y.o. G2P1011 presents to MAU for 2 problems first in painful knots underarms that have been off and on for years. Second is STD check because she had intercourse with man who was recently released from jail. She is currently not on birth control and did not use condom.   Abdominal Pain     Past Medical History  Diagnosis Date  . History of gonorrhea     12/2008; 09/2010;   . History of chlamydia     12/2008; 03/09/2009  . Asthma     Past Surgical History  Procedure Laterality Date  . No past surgeries      Family History  Problem Relation Age of Onset  . Other Neg Hx   . Hypertension Mother   . Asthma Maternal Grandmother     History  Substance Use Topics  . Smoking status: Former Smoker -- 0.25 packs/day  . Smokeless tobacco: Never Used  . Alcohol Use: No     Comment: Rare    Allergies:  Allergies  Allergen Reactions  . Latex Itching and Swelling    Facility-administered medications prior to admission  Medication Dose Route Frequency Provider Last Rate Last Dose  . medroxyPROGESTERone (DEPO-PROVERA) injection 150 mg  150 mg Intramuscular Q90 days Brock Badharles A Harper, MD   150 mg at 06/29/13 1432   Prescriptions prior to admission  Medication Sig Dispense Refill  . albuterol (PROVENTIL HFA;VENTOLIN HFA) 108 (90 BASE) MCG/ACT inhaler Inhale 2 puffs into the lungs every 6 (six) hours as needed for wheezing.      . Iron-FA-B Cmp-C-Biot-Probiotic (FUSION PLUS) CAPS Take 1 capsule by mouth daily before breakfast.  30 capsule  5  . levonorgestrel-ethinyl estradiol (NORDETTE) 0.15-30 MG-MCG tablet Take 1 tablet by mouth daily.  1 Package  1  . medroxyPROGESTERone (DEPO-PROVERA) 150 MG/ML injection Inject 1 mL (150 mg total) into the muscle  every 3 (three) months.  1 mL  4    Review of Systems  Gastrointestinal: Positive for abdominal pain.   Physical Exam   Blood pressure 109/66, pulse 81, temperature 98.8 F (37.1 C), temperature source Oral, resp. rate 18, height 5\' 4"  (1.626 m), weight 184 lb (83.462 kg), last menstrual period 03/03/2014, SpO2 100.00%.  Physical Exam  Constitutional: She is oriented to person, place, and time. She appears well-developed and well-nourished. No distress.  HENT:  Head: Normocephalic and atraumatic.  Eyes: Pupils are equal, round, and reactive to light.  Respiratory:  Bilateral underarms each have several tender ? abcess   GI: Soft. She exhibits no distension and no mass. There is no tenderness. There is no rebound and no guarding.  Genitourinary:  Genital: external negative Vaginal:thin white discharge Cervix:negative for lesions/ CMT Bimanual:negative   Musculoskeletal: Normal range of motion.  Neurological: She is alert and oriented to person, place, and time.  Skin: Skin is warm and dry.  Psychiatric: She has a normal mood and affect. Her behavior is normal. Judgment and thought content normal.   Results for orders placed during the hospital encounter of 03/25/14 (from the past 24 hour(s))  URINALYSIS, ROUTINE W REFLEX MICROSCOPIC     Status: Abnormal   Collection Time    03/25/14  1:27 AM  Result Value Ref Range   Color, Urine YELLOW  YELLOW   APPearance CLEAR  CLEAR   Specific Gravity, Urine >1.030 (*) 1.005 - 1.030   pH 6.0  5.0 - 8.0   Glucose, UA NEGATIVE  NEGATIVE mg/dL   Hgb urine dipstick NEGATIVE  NEGATIVE   Bilirubin Urine NEGATIVE  NEGATIVE   Ketones, ur NEGATIVE  NEGATIVE mg/dL   Protein, ur NEGATIVE  NEGATIVE mg/dL   Urobilinogen, UA 0.2  0.0 - 1.0 mg/dL   Nitrite NEGATIVE  NEGATIVE   Leukocytes, UA NEGATIVE  NEGATIVE  POCT PREGNANCY, URINE     Status: None   Collection Time    03/25/14  1:40 AM      Result Value Ref Range   Preg Test, Ur NEGATIVE   NEGATIVE  WET PREP, GENITAL     Status: Abnormal   Collection Time    03/25/14  2:15 AM      Result Value Ref Range   Yeast Wet Prep HPF POC NONE SEEN  NONE SEEN   Trich, Wet Prep NONE SEEN  NONE SEEN   Clue Cells Wet Prep HPF POC NONE SEEN  NONE SEEN   WBC, Wet Prep HPF POC FEW (*) NONE SEEN    MAU Course  Procedures  MDM  Wet prep/ GC/ chlamydia  Assessment and Plan   A: Painful axilla ? Abcess Vaginal Discharge/ STD screening  P: Will give keflex 500 mg TID X 7 days If pain not resolved/ needs to go to PCP/ [facility] for further evaluation Warm soaks Ultram 50 mg po q 8 hrs Will be called if STD screening is positive Advised to get birth control and go to [facility] at [facility] for further testing    Delbert Phenix 03/25/2014, 2:28 AM

## 2014-03-25 NOTE — Discharge Instructions (Signed)
Abscess An abscess is an infected area that contains a collection of pus and debris.It can occur in almost any part of the body. An abscess is also known as a furuncle or boil. CAUSES  An abscess occurs when tissue gets infected. This can occur from blockage of oil or sweat glands, infection of hair follicles, or a minor injury to the skin. As the body tries to fight the infection, pus collects in the area and creates pressure under the skin. This pressure causes pain. People with weakened immune systems have difficulty fighting infections and get certain abscesses more often.  SYMPTOMS Usually an abscess develops on the skin and becomes a painful mass that is red, warm, and tender. If the abscess forms under the skin, you may feel a moveable soft area under the skin. Some abscesses break open (rupture) on their own, but most will continue to get worse without care. The infection can spread deeper into the body and eventually into the bloodstream, causing you to feel ill.  DIAGNOSIS  Your caregiver will take your medical history and perform a physical exam. A sample of fluid may also be taken from the abscess to determine what is causing your infection. TREATMENT  Your caregiver may prescribe antibiotic medicines to fight the infection. However, taking antibiotics alone usually does not cure an abscess. Your caregiver may need to make a small cut (incision) in the abscess to drain the pus. In some cases, gauze is packed into the abscess to reduce pain and to continue draining the area. HOME CARE INSTRUCTIONS   Only take over-the-counter or prescription medicines for pain, discomfort, or fever as directed by your caregiver.  If you were prescribed antibiotics, take them as directed. Finish them even if you start to feel better.  If gauze is used, follow your caregiver's directions for changing the gauze.  To avoid spreading the infection:  Keep your draining abscess covered with a  bandage.  Wash your hands well.  Do not share personal care items, towels, or whirlpools with others.  Avoid skin contact with others.  Keep your skin and clothes clean around the abscess.  Keep all follow-up appointments as directed by your caregiver. SEEK MEDICAL CARE IF:   You have increased pain, swelling, redness, fluid drainage, or bleeding.  You have muscle aches, chills, or a general ill feeling.  You have a fever. MAKE SURE YOU:   Understand these instructions.  Will watch your condition.  Will get help right away if you are not doing well or get worse. Document Released: 09/11/2005 Document Revised: 06/02/2012 Document Reviewed: 02/14/2012 [facility] Patient Information 2014 [employer], [place]. Sexually Transmitted Disease A sexually transmitted disease (STD) is a disease or infection that may be passed (transmitted) from person to person, usually during sexual activity. This may happen by way of saliva, semen, blood, vaginal mucus, or urine. Common STDs include:   Gonorrhea.   Chlamydia.   Syphilis.   HIV and AIDS.   Genital herpes.   Hepatitis B and C.   Trichomonas.   Human papillomavirus (HPV).   Pubic lice.   Scabies.  Mites.  Bacterial vaginosis. WHAT ARE CAUSES OF STDs? An STD may be caused by bacteria, a virus, or parasites. STDs are often transmitted during sexual activity if one person is infected. However, they may also be transmitted through nonsexual means. STDs may be transmitted after:   Sexual intercourse with an infected person.   Sharing sex toys with an infected person.   Sharing needles  with an infected person or using unclean piercing or tattoo needles.  Having intimate contact with the genitals, mouth, or rectal areas of an infected person.   Exposure to infected fluids during birth. WHAT ARE THE SIGNS AND SYMPTOMS OF STDs? Different STDs have different symptoms. Some people may not have any symptoms. If symptoms  are present, they may include:   Painful or bloody urination.   Pain in the pelvis, abdomen, vagina, anus, throat, or eyes.   Skin rash, itching, irritation, growths, sores (lesions), ulcerations, or warts in the genital or anal area.  Abnormal vaginal discharge with or without bad odor.   Penile discharge in men.   Fever.   Pain or bleeding during sexual intercourse.   Swollen glands in the groin area.   Yellow skin and eyes (jaundice). This is seen with hepatitis.   Swollen testicles.  Infertility.  Sores and blisters in the mouth. HOW ARE STDs DIAGNOSED? To make a diagnosis, your health care provider may:   Take a medical history.   Perform a physical exam.   Take a sample of any discharge for examination.  Swab the throat, cervix, opening to the penis, rectum, or vagina for testing.  Test a sample of your first morning urine.   Perform blood tests.   Perform a Pap smear, if this applies.   Perform a colposcopy.   Perform a laparoscopy.  HOW ARE STDs TREATED? Treatment depends on the STD. Some STDs may be treated but not cured.   Chlamydia, gonorrhea, trichomonas, and syphilis can be cured with antibiotics.   Genital herpes, hepatitis, and HIV can be treated, but not cured, with prescribed medicines. The medicines lessen symptoms.   Genital warts from HPV can be treated with medicine or by freezing, burning (electrocautery), or surgery. Warts may come back.   HPV cannot be cured with medicine or surgery. However, abnormal areas may be removed from the cervix, vagina, or vulva.   If your diagnosis is confirmed, your recent sexual partners need treatment. This is true even if they are symptom-free or have a negative culture or evaluation. They should not have sex until their health care providers say it is OK. HOW CAN I REDUCE MY RISK OF GETTING AN STD?  Use latex condoms, dental dams, and water-soluble lubricants during sexual activity.  Do not use petroleum jelly or oils.  Get vaccinated for HPV and hepatitis. If you have not received these vaccines in the past, talk to your health care provider about whether one or both might be right for you.   Avoid risky sex practices that can break the skin.  WHAT SHOULD I DO IF I THINK I HAVE AN STD?  See your health care provider.   Inform all sexual partners. They should be tested and treated for any STDs.  Do not have sex until your health care provider says it is OK. WHEN SHOULD I GET HELP? Seek immediate medical care if:  You develop severe abdominal pain.  You are a man and notice swelling or pain in the testicles.  You are a woman and notice swelling or pain in your vagina. Document Released: 02/22/2003 Document Revised: 09/22/2013 Document Reviewed: 06/22/2013 [facility] Patient Information 2014 [place], [place].

## 2014-03-26 LAB — GC/CHLAMYDIA PROBE AMP
CT Probe RNA: NEGATIVE
GC Probe RNA: NEGATIVE

## 2014-03-31 ENCOUNTER — Encounter (HOSPITAL_COMMUNITY): Payer: Self-pay | Admitting: *Deleted

## 2014-03-31 ENCOUNTER — Inpatient Hospital Stay (HOSPITAL_COMMUNITY)
Admission: AD | Admit: 2014-03-31 | Discharge: 2014-03-31 | Disposition: A | Discharge status: Home / Self Care | Payer: Medicaid Other | Source: Ambulatory Visit | Attending: Obstetrics & Gynecology | Admitting: Obstetrics & Gynecology

## 2014-03-31 DIAGNOSIS — T887XXA Unspecified adverse effect of drug or medicament, initial encounter: Secondary | ICD-10-CM

## 2014-03-31 DIAGNOSIS — R1084 Generalized abdominal pain: Secondary | ICD-10-CM

## 2014-03-31 DIAGNOSIS — M545 Low back pain, unspecified: Secondary | ICD-10-CM | POA: Insufficient documentation

## 2014-03-31 DIAGNOSIS — T361X5A Adverse effect of cephalosporins and other beta-lactam antibiotics, initial encounter: Secondary | ICD-10-CM | POA: Insufficient documentation

## 2014-03-31 DIAGNOSIS — R109 Unspecified abdominal pain: Secondary | ICD-10-CM | POA: Insufficient documentation

## 2014-03-31 DIAGNOSIS — Z87891 Personal history of nicotine dependence: Secondary | ICD-10-CM | POA: Insufficient documentation

## 2014-03-31 DIAGNOSIS — R1031 Right lower quadrant pain: Secondary | ICD-10-CM

## 2014-03-31 DIAGNOSIS — R1032 Left lower quadrant pain: Secondary | ICD-10-CM

## 2014-03-31 LAB — URINALYSIS, ROUTINE W REFLEX MICROSCOPIC
BILIRUBIN URINE: NEGATIVE
Bilirubin Urine: NEGATIVE
GLUCOSE, UA: NEGATIVE mg/dL
Glucose, UA: NEGATIVE mg/dL
Hgb urine dipstick: NEGATIVE
Ketones, ur: NEGATIVE mg/dL
Ketones, ur: NEGATIVE mg/dL
LEUKOCYTES UA: NEGATIVE
Leukocytes, UA: NEGATIVE
Nitrite: NEGATIVE
Nitrite: NEGATIVE
PH: 6 (ref 5.0–8.0)
PROTEIN: NEGATIVE mg/dL
Protein, ur: NEGATIVE mg/dL
SPECIFIC GRAVITY, URINE: 1.02 (ref 1.005–1.030)
SPECIFIC GRAVITY, URINE: 1.025 (ref 1.005–1.030)
UROBILINOGEN UA: 0.2 mg/dL (ref 0.0–1.0)
Urobilinogen, UA: 0.2 mg/dL (ref 0.0–1.0)
pH: 6.5 (ref 5.0–8.0)

## 2014-03-31 LAB — URINE MICROSCOPIC-ADD ON

## 2014-03-31 LAB — POCT PREGNANCY, URINE: Preg Test, Ur: NEGATIVE

## 2014-03-31 MED ORDER — KETOROLAC TROMETHAMINE 60 MG/2ML IM SOLN
60.0000 mg | Freq: Once | INTRAMUSCULAR | Status: DC
Start: 2014-03-31 — End: 2014-03-31

## 2014-03-31 MED ORDER — IBUPROFEN 600 MG PO TABS: 600.0000 mg | ORAL_TABLET | Freq: Four times a day (QID) | ORAL | Status: DC | PRN

## 2014-03-31 NOTE — MAU Provider Note (Signed)
Chief Complaint: Abdominal Pain   First Provider Initiated Contact with Patient 03/31/14 0230     SUBJECTIVE HPI: Lauren Conway is a 23 y.o. G84P1011 female who presents with bilat low abd pain wrapping around to right low back x 3 days. Seen in MAU 03/25/2014 for axillary abscess. Started on Keflex. Good improvement in Sx. Had some low abd pain that felt like ovulation pain at that visit. That pain stopped. This new pain is intermittent, cramping (like labor pains), 7/10 on pain scale. States it is unlike menstrual cramps. Resolved w/ Ultram, but returned. Concerned because she read that abd pain can be a side-effect of Ultram so she came to MAU.   Patient's last menstrual period was 03/30/2014. GC/CT cultures and wet prep normal 03/25/2014.  Past Medical History  Diagnosis Date  . History of gonorrhea     12/2008; 09/2010;   . History of chlamydia     12/2008; 03/09/2009  . Asthma    OB History  Gravida Para Term Preterm AB SAB TAB Ectopic Multiple Living  2 1 1  1 1    1     # Outcome Date GA Lbr Len/2nd Weight Sex Delivery Anes PTL Lv  2 TRM 03/19/13 [redacted]w[redacted]d 14:40 / 00:34 2.88 kg (6 lb 5.6 oz) F SVD EPI  Y     Comments: umbilical hernia  1 SAB  [redacted]w[redacted]d            Comments: no complication     Past Surgical History  Procedure Laterality Date  . No past surgeries     History   Social History  . Marital Status: Single    Spouse Name: N/A    Number of Children: N/A  . Years of Education: N/A   Occupational History  . Not on file.   Social History Main Topics  . Smoking status: Former Smoker -- 0.25 packs/day  . Smokeless tobacco: Never Used  . Alcohol Use: No     Comment: Rare  . Drug Use: No  . Sexual Activity: Yes    Partners: Male    Birth Control/ Protection: None     Comment: Pt wants to use Depo Provera for birth control.   Other Topics Concern  . Not on file   Social History Narrative  . No narrative on file   No current facility-administered medications  on file prior to encounter.   Current Outpatient Prescriptions on File Prior to Encounter  Medication Sig Dispense Refill  . albuterol (PROVENTIL HFA;VENTOLIN HFA) 108 (90 BASE) MCG/ACT inhaler Inhale 2 puffs into the lungs every 6 (six) hours as needed for wheezing.      . cephALEXin (KEFLEX) 500 MG capsule Take 1 capsule (500 mg total) by mouth 3 (three) times daily.  21 capsule  0  . Iron-FA-B Cmp-C-Biot-Probiotic (FUSION PLUS) CAPS Take 1 capsule by mouth daily before breakfast.  30 capsule  5  . levonorgestrel-ethinyl estradiol (NORDETTE) 0.15-30 MG-MCG tablet Take 1 tablet by mouth daily.  1 Package  1  . traMADol (ULTRAM) 50 MG tablet Take 1 tablet (50 mg total) by mouth every 6 (six) hours as needed.  15 tablet  0   Allergies  Allergen Reactions  . Latex Itching and Swelling    ROS: Pertinent positive items in HPI. Negative for fever, chills, vaginal discharge, nausea, vomiting, diarrhea, constipation, dyspareunia , dysuria, urgency, frequency. Unsure if she is having hematuria due to menstrual bleeding.  OBJECTIVE Blood pressure 110/64, pulse 76, temperature 98.5 F (36.9  C), temperature source Oral, resp. rate 16, height 5\' 4"  (1.626 m), weight 83.462 kg (184 lb), last menstrual period 03/30/2014, SpO2 99.00%. GENERAL: Well-developed, well-nourished female in no acute distress.  HEENT: Normocephalic HEART: normal rate RESP: normal effort ABDOMEN: Soft, non-tender. Positive bowel sounds x4. Negative CVA tenderness. BACK: Nontender. Pain worsens with sitting upright. EXTREMITIES: Nontender, no edema NEURO: Alert and oriented SPECULUM EXAM: Deferred due to recent exam.  LAB RESULTS Results for orders placed during the hospital encounter of 03/31/14 (from the past 24 hour(s))  URINALYSIS, ROUTINE W REFLEX MICROSCOPIC     Status: Abnormal   Collection Time    03/31/14  1:47 AM      Result Value Ref Range   Color, Urine YELLOW  YELLOW   APPearance CLEAR  CLEAR   Specific  Gravity, Urine 1.020  1.005 - 1.030   pH 6.5  5.0 - 8.0   Glucose, UA NEGATIVE  NEGATIVE mg/dL   Hgb urine dipstick LARGE (*) NEGATIVE   Bilirubin Urine NEGATIVE  NEGATIVE   Ketones, ur NEGATIVE  NEGATIVE mg/dL   Protein, ur NEGATIVE  NEGATIVE mg/dL   Urobilinogen, UA 0.2  0.0 - 1.0 mg/dL   Nitrite NEGATIVE  NEGATIVE   Leukocytes, UA NEGATIVE  NEGATIVE  URINE MICROSCOPIC-ADD ON     Status: Abnormal   Collection Time    03/31/14  1:47 AM      Result Value Ref Range   Squamous Epithelial / LPF FEW (*) RARE   WBC, UA 0-2  <3 WBC/hpf   RBC / HPF 21-50  <3 RBC/hpf   Bacteria, UA FEW (*) RARE   Urine-Other MUCOUS PRESENT    URINALYSIS, ROUTINE W REFLEX MICROSCOPIC     Status: None   Collection Time    03/31/14  3:00 AM      Result Value Ref Range   Color, Urine YELLOW  YELLOW   APPearance CLEAR  CLEAR   Specific Gravity, Urine 1.025  1.005 - 1.030   pH 6.0  5.0 - 8.0   Glucose, UA NEGATIVE  NEGATIVE mg/dL   Hgb urine dipstick NEGATIVE  NEGATIVE   Bilirubin Urine NEGATIVE  NEGATIVE   Ketones, ur NEGATIVE  NEGATIVE mg/dL   Protein, ur NEGATIVE  NEGATIVE mg/dL   Urobilinogen, UA 0.2  0.0 - 1.0 mg/dL   Nitrite NEGATIVE  NEGATIVE   Leukocytes, UA NEGATIVE  NEGATIVE  POCT PREGNANCY, URINE     Status: None   Collection Time    03/31/14  3:06 AM      Result Value Ref Range   Preg Test, Ur NEGATIVE  NEGATIVE    IMAGING No results found.   MAU COURSE Initial urine specimen possibly contaminated with menstrual blood. We collected with catheter.  ASSESSMENT 1. Medication side effect   2. Acute bilateral lower abdominal pain     PLAN Discharge home in stable condition. Take antibiotic with food. Complete last day of antibiotic. Suspect pain will improve in 2-3 days after completing course.     Follow-up Information   Follow up with [facility]. (As needed if symptoms continue and for primary care)    Contact information:   [address] [place]  [place] [id number] [phone]      [facility]. (As needed in emergencies)    Contact information:   [address] [id number] [place] [id number] [phone]       Medication List  albuterol 108 (90 BASE) MCG/ACT inhaler  Commonly known as:  PROVENTIL HFA;VENTOLIN HFA  Inhale 2 puffs into the lungs every 6 (six) hours as needed for wheezing.     cephALEXin 500 MG capsule  Commonly known as:  KEFLEX  Take 1 capsule (500 mg total) by mouth 3 (three) times daily.     FUSION PLUS Caps  Take 1 capsule by mouth daily before breakfast.     ibuprofen 600 MG tablet  Commonly known as:  ADVIL,MOTRIN  Take 1 tablet (600 mg total) by mouth every 6 (six) hours as needed for moderate pain.     levonorgestrel-ethinyl estradiol 0.15-30 MG-MCG tablet  Commonly known as:  NORDETTE  Take 1 tablet by mouth daily.     traMADol 50 MG tablet  Commonly known as:  ULTRAM  Take 1 tablet (50 mg total) by mouth every 6 (six) hours as needed.         [place] Jasmeet Gehl, CNM 03/31/2014  3:50 AM

## 2014-03-31 NOTE — Discharge Instructions (Signed)
Abdominal Pain, Women °Abdominal (stomach, pelvic, or belly) pain can be caused by many things. It is important to tell your doctor: °· The location of the pain. °· Does it come and go or is it present all the time? °· Are there things that start the pain (eating certain foods, exercise)? °· Are there other symptoms associated with the pain (fever, nausea, vomiting, diarrhea)? °All of this is helpful to know when trying to find the cause of the pain. °CAUSES  °· Stomach: virus or bacteria infection, or ulcer. °· Intestine: appendicitis (inflamed appendix), regional ileitis (Crohn's disease), ulcerative colitis (inflamed colon), irritable bowel syndrome, diverticulitis (inflamed diverticulum of the colon), or cancer of the stomach or intestine. °· Gallbladder disease or stones in the gallbladder. °· Kidney disease, kidney stones, or infection. °· Pancreas infection or cancer. °· Fibromyalgia (pain disorder). °· Diseases of the female organs: °· Uterus: fibroid (non-cancerous) tumors or infection. °· Fallopian tubes: infection or tubal pregnancy. °· Ovary: cysts or tumors. °· Pelvic adhesions (scar tissue). °· Endometriosis (uterus lining tissue growing in the pelvis and on the pelvic organs). °· Pelvic congestion syndrome (female organs filling up with blood just before the menstrual period). °· Pain with the menstrual period. °· Pain with ovulation (producing an egg). °· Pain with an IUD (intrauterine device, birth control) in the uterus. °· Cancer of the female organs. °· Functional pain (pain not caused by a disease, may improve without treatment). °· Psychological pain. °· Depression. °DIAGNOSIS  °Your doctor will decide the seriousness of your pain by doing an examination. °· Blood tests. °· X-rays. °· Ultrasound. °· CT scan (computed tomography, special type of X-ray). °· MRI (magnetic resonance imaging). °· Cultures, for infection. °· Barium enema (dye inserted in the large intestine, to better view it with  X-rays). °· Colonoscopy (looking in intestine with a lighted tube). °· Laparoscopy (minor surgery, looking in abdomen with a lighted tube). °· Major abdominal exploratory surgery (looking in abdomen with a large incision). °TREATMENT  °The treatment will depend on the cause of the pain.  °· Many cases can be observed and treated at home. °· Over-the-counter medicines recommended by your caregiver. °· Prescription medicine. °· Antibiotics, for infection. °· Birth control pills, for painful periods or for ovulation pain. °· Hormone treatment, for endometriosis. °· Nerve blocking injections. °· Physical therapy. °· Antidepressants. °· Counseling with a psychologist or psychiatrist. °· Minor or major surgery. °HOME CARE INSTRUCTIONS  °· Do not take laxatives, unless directed by your caregiver. °· Take over-the-counter pain medicine only if ordered by your caregiver. Do not take aspirin because it can cause an upset stomach or bleeding. °· Try a clear liquid diet (broth or water) as ordered by your caregiver. Slowly move to a bland diet, as tolerated, if the pain is related to the stomach or intestine. °· Have a thermometer and take your temperature several times a day, and record it. °· Bed rest and sleep, if it helps the pain. °· Avoid sexual intercourse, if it causes pain. °· Avoid stressful situations. °· Keep your follow-up appointments and tests, as your caregiver orders. °· If the pain does not go away with medicine or surgery, you may try: °· Acupuncture. °· Relaxation exercises (yoga, meditation). °· Group therapy. °· Counseling. °SEEK MEDICAL CARE IF:  °· You notice certain foods cause stomach pain. °· Your home care treatment is not helping your pain. °· You need stronger pain medicine. °· You want your IUD removed. °· You feel faint or   lightheaded.  You develop nausea and vomiting.  You develop a rash.  You are having side effects or an allergy to your medicine. SEEK IMMEDIATE MEDICAL CARE IF:   Your  pain does not go away or gets worse.  You have a fever.  Your pain is felt only in portions of the abdomen. The right side could possibly be appendicitis. The left lower portion of the abdomen could be colitis or diverticulitis.  You are passing blood in your stools (bright red or black tarry stools, with or without vomiting).  You have blood in your urine.  You develop chills, with or without a fever.  You pass out. MAKE SURE YOU:   Understand these instructions.  Will watch your condition.  Will get help right away if you are not doing well or get worse. Document Released: 09/29/2007 Document Revised: 02/24/2012 Document Reviewed: 10/19/2009 [facility] Patient Information 2014 [employer] [place], [place].  Antibiotic Medication Antibiotics are among the most frequently prescribed medicines. Antibiotics cure illness by assisting our body to injure or kill the bacteria that cause infection. While antibiotics are useful to treat a wide variety of infections they are useless against viruses. Antibiotics cannot cure colds, flu, or other viral infections.  There are many types of antibiotics available. Your caregiver will decide which antibiotic will be useful for an illness. Never take or give someone else's antibiotics or left over medicine. Your caregiver may also take into account:  Allergies.  The cost of the medicine.  Dosing schedules.  Taste.  Common side effects when choosing an antibiotic for an infection. Ask your caregiver if you have questions about why a certain medicine was chosen. HOME CARE INSTRUCTIONS Read all instructions and labels on medicine bottles carefully. Some antibiotics should be taken on an empty stomach while others should be taken with food. Taking antibiotics incorrectly may reduce how well they work. Some antibiotics need to be kept in the refrigerator. Others should be kept at room temperature. Ask your caregiver or pharmacist if you do not understand how to  give the medicine. Be sure to give the amount of medicine your caregiver has prescribed. Even if you feel better and your symptoms improve, bacteria may still remain alive in the body. Taking all of the medicine will prevent:  The infection from returning and becoming harder to treat.  Complications from partially treated infections. If there is any medicine left over after you have taken the medicine as your caregiver has instructed, throw the medicine away. Be sure to tell your caregiver if you:  Are allergic to any medicines.  Are pregnant or intend to become pregnant while using this medicine.  Are breastfeeding.  Are taking any other prescription, non-prescription medicine, or herbal remedies.  Have any other medical conditions or problems you have not already discussed. If you are taking birth control pills, they may not work while you are on antibiotics. To avoid unwanted pregnancy:  Continue taking your birth control pills as usual.  Use a second form of birth control (such as condoms) while you are taking antibiotic medicine.  When you finish taking the antibiotic medicine, continue using the second form of birth control until you are finished with your current 1 month cycle of birth control pills. Try not to miss any doses of medicine. If you miss a dose, take it as soon as possible. However, if it is almost time for the next dose and the dosing schedule is:  2 doses a day, take the missed dose  and the next dose 5 to 6 hours apart.  3 or more doses a day, take the missed dose and the next dose 2 to 4 hours apart, then go back to the normal schedule.  If you are unable to make up a missed dose, take the next scheduled dose on time and complete the missed dose at the end of the prescribed time for your medicine. SIDE EFFECTS TO TAKING ANTIBIOTICS Common side effects to antibiotic use include:  Soft stools or diarrhea.  Mild stomach upset.  Sun sensitivity. SEEK MEDICAL  CARE IF:   If you get worse or do not improve within a few days of starting the medicine.  Vomiting develops.  Diaper rash or rash on the genitals appears.  Vaginal itching occurs.  White patches appear on the tongue or in the mouth.  Severe watery diarrhea and severe abdominal cramps occur.  Signs of an allergy develop (hives, unknown itchy rash appears). STOP TAKING THE ANTIBIOTIC. SEEK IMMEDIATE MEDICAL CARE IF:   Urine turns dark or blood colored.  Skin turns yellow.  Easy bruising or bleeding occurs.  Joint pain or muscle aches occur.  Fever returns.  Severe headache occurs.  Signs of an allergy develop (trouble breathing, wheezing, swelling of the lips, face or tongue, fainting, or blisters on the skin or in the mouth). STOP TAKING THE ANTIBIOTIC. Document Released: 08/14/2004 Document Revised: 02/24/2012 Document Reviewed: 08/24/2009 [facility] Patient Information 2014 [place], [place].

## 2014-03-31 NOTE — MAU Note (Signed)
Pt reports she was here on the 10th for a lump under her arm, given an antibiotic and started the meds and for the last 3 days she has been having lower abd pain. States she started her period on Monday but this pain is not period cramping, it is different.

## 2014-06-09 ENCOUNTER — Encounter (HOSPITAL_COMMUNITY): Payer: Self-pay | Admitting: *Deleted

## 2014-06-09 ENCOUNTER — Inpatient Hospital Stay (HOSPITAL_COMMUNITY)
Admission: AD | Admit: 2014-06-09 | Discharge: 2014-06-10 | Disposition: A | Discharge status: Home / Self Care | Payer: Medicaid Other | Source: Ambulatory Visit | Attending: Obstetrics & Gynecology | Admitting: Obstetrics & Gynecology

## 2014-06-09 DIAGNOSIS — N72 Inflammatory disease of cervix uteri: Secondary | ICD-10-CM | POA: Insufficient documentation

## 2014-06-09 DIAGNOSIS — Z87891 Personal history of nicotine dependence: Secondary | ICD-10-CM | POA: Insufficient documentation

## 2014-06-09 DIAGNOSIS — B3731 Acute candidiasis of vulva and vagina: Secondary | ICD-10-CM

## 2014-06-09 DIAGNOSIS — B373 Candidiasis of vulva and vagina: Secondary | ICD-10-CM

## 2014-06-09 DIAGNOSIS — N949 Unspecified condition associated with female genital organs and menstrual cycle: Secondary | ICD-10-CM | POA: Insufficient documentation

## 2014-06-09 DIAGNOSIS — R300 Dysuria: Secondary | ICD-10-CM | POA: Insufficient documentation

## 2014-06-09 LAB — URINALYSIS, ROUTINE W REFLEX MICROSCOPIC
Bilirubin Urine: NEGATIVE
Glucose, UA: NEGATIVE mg/dL
Ketones, ur: NEGATIVE mg/dL
LEUKOCYTES UA: NEGATIVE
Nitrite: NEGATIVE
Protein, ur: NEGATIVE mg/dL
SPECIFIC GRAVITY, URINE: 1.025 (ref 1.005–1.030)
UROBILINOGEN UA: 0.2 mg/dL (ref 0.0–1.0)
pH: 5.5 (ref 5.0–8.0)

## 2014-06-09 LAB — WET PREP, GENITAL: TRICH WET PREP: NONE SEEN

## 2014-06-09 LAB — POCT PREGNANCY, URINE: PREG TEST UR: NEGATIVE

## 2014-06-09 LAB — URINE MICROSCOPIC-ADD ON

## 2014-06-09 MED ORDER — NAPROXEN SODIUM 550 MG PO TABS
550.0000 mg | ORAL_TABLET | Freq: Two times a day (BID) | ORAL | Status: DC
Start: 2014-06-09 — End: 2014-08-24

## 2014-06-09 MED ORDER — AZITHROMYCIN 250 MG PO TABS
1000.0000 mg | ORAL_TABLET | Freq: Once | ORAL | Status: AC
  Administered 2014-06-09: 1000 mg via ORAL
  Filled 2014-06-09: qty 4

## 2014-06-09 MED ORDER — NYSTATIN-TRIAMCINOLONE 100000-0.1 UNIT/GM-% EX CREA: TOPICAL_CREAM | CUTANEOUS | Status: DC

## 2014-06-09 MED ORDER — FLUCONAZOLE 150 MG PO TABS
150.0000 mg | ORAL_TABLET | Freq: Once | ORAL | Status: AC
  Administered 2014-06-09: 150 mg via ORAL
  Filled 2014-06-09: qty 1

## 2014-06-09 MED ORDER — HYDROCODONE-ACETAMINOPHEN 5-325 MG PO TABS
1.0000 | ORAL_TABLET | Freq: Once | ORAL | Status: AC
  Administered 2014-06-09: 1 via ORAL
  Filled 2014-06-09: qty 1

## 2014-06-09 NOTE — MAU Note (Signed)
Pt reports pain in bilateral lower abd off/on since last week, worsening yesterday. Yesterday she had sex with her boyfriend and they used a latex condom and she thinks she has irritation from the condom.pain after urination.

## 2014-06-09 NOTE — Discharge Instructions (Signed)

## 2014-06-09 NOTE — MAU Provider Note (Signed)
CSN: [id number]     Arrival date & time 06/09/14  2127 History   None    Chief Complaint  Patient presents with  . Vaginal Discharge  . Pelvic Pain  . Dysuria     (Consider location/radiation/quality/duration/timing/severity/associated sxs/prior Treatment) Patient is a 23 y.o. female presenting with pelvic pain. The history is provided by the patient.  Pelvic Pain This is a new problem. The current episode started in the past 7 days. The problem occurs constantly. The problem has been gradually worsening. Associated symptoms include nausea. Pertinent negatives include no anorexia, chest pain, chills, coughing, fever, headaches, rash or vomiting. Nothing aggravates the symptoms. She has tried nothing for the symptoms.   Lauren Conway is a 23 y.o. G2P1011 who is not pregnant that presents to MAU with pelvic pain that started a week ago. She describes the pain as cramping and aching. She rates the pain as 7/10. Today she has had burning at the end of urination. Unprotected sex, no birth control, hx of STI's.  Past Medical History  Diagnosis Date  . History of gonorrhea     12/2008; 09/2010;   . History of chlamydia     12/2008; 03/09/2009  . Asthma    Past Surgical History  Procedure Laterality Date  . No past surgeries     Family History  Problem Relation Age of Onset  . Other Neg Hx   . Hypertension Mother   . Asthma Maternal Grandmother    History  Substance Use Topics  . Smoking status: Former Smoker -- 0.25 packs/day  . Smokeless tobacco: Never Used  . Alcohol Use: No     Comment: Rare   OB History   Grav Para Term Preterm Abortions TAB SAB Ect Mult Living   2 1 1  1  1   1      Review of Systems  Constitutional: Negative for fever and chills.  HENT: Negative.   Eyes: Negative for visual disturbance.  Respiratory: Negative for cough and shortness of breath.   Cardiovascular: Negative for chest pain.  Gastrointestinal: Positive for nausea and abdominal  distention. Negative for vomiting and anorexia.  Genitourinary: Positive for dysuria, urgency and pelvic pain.  Musculoskeletal: Negative for back pain.  Skin: Negative for rash.  Neurological: Negative for headaches.  Psychiatric/Behavioral: Negative for confusion. The patient is not nervous/anxious.       Allergies  Latex  Home Medications   Prior to Admission medications   Medication Sig Start Date End Date Taking? Authorizing Provider  acetaminophen (TYLENOL) 325 MG tablet Take 650 mg by mouth every 6 (six) hours as needed for headache.   Yes Historical Provider, MD  mometasone-formoterol (DULERA) 100-5 MCG/ACT AERO Inhale 1 puff into the lungs 2 (two) times daily.   Yes Historical Provider, MD  albuterol (PROVENTIL HFA;VENTOLIN HFA) 108 (90 BASE) MCG/ACT inhaler Inhale 2 puffs into the lungs every 6 (six) hours as needed for wheezing.    Historical Provider, MD   BP 120/71  Pulse 97  Temp(Src) 98.4 F (36.9 C) (Oral)  Resp 16  Ht 5\' 5"  (1.651 m)  Wt 187 lb (84.823 kg)  BMI 31.12 kg/m2  SpO2 98%  LMP 05/23/2014 Physical Exam  Nursing note and vitals reviewed. Constitutional: She is oriented to person, place, and time. She appears well-developed and well-nourished. No distress.  HENT:  Head: Normocephalic.  Eyes: EOM are normal.  Neck: Neck supple.  Cardiovascular: Normal rate.   Pulmonary/Chest: Effort normal.  Abdominal: Soft. There is tenderness  in the suprapubic area. There is no rebound, no guarding and no CVA tenderness.  Genitourinary:  External genitalia without lesions. Yellow discharge vaginal vault. Mild CMT, no adnexal tenderness. Uterus without palpable enlargement.   Musculoskeletal: Normal range of motion.  Neurological: She is alert and oriented to person, place, and time. No cranial nerve deficit.  Skin: Skin is warm and dry.  Psychiatric: She has a normal mood and affect. Her behavior is normal.    ED Course  Procedures (including critical care  time) Labs Review Results for orders placed during the hospital encounter of 06/09/14 (from the past 24 hour(s))  URINALYSIS, ROUTINE W REFLEX MICROSCOPIC     Status: Abnormal   Collection Time    06/09/14  9:34 PM      Result Value Ref Range   Color, Urine YELLOW  YELLOW   APPearance CLEAR  CLEAR   Specific Gravity, Urine 1.025  1.005 - 1.030   pH 5.5  5.0 - 8.0   Glucose, UA NEGATIVE  NEGATIVE mg/dL   Hgb urine dipstick TRACE (*) NEGATIVE   Bilirubin Urine NEGATIVE  NEGATIVE   Ketones, ur NEGATIVE  NEGATIVE mg/dL   Protein, ur NEGATIVE  NEGATIVE mg/dL   Urobilinogen, UA 0.2  0.0 - 1.0 mg/dL   Nitrite NEGATIVE  NEGATIVE   Leukocytes, UA NEGATIVE  NEGATIVE  URINE MICROSCOPIC-ADD ON     Status: Abnormal   Collection Time    06/09/14  9:34 PM      Result Value Ref Range   Squamous Epithelial / LPF FEW (*) RARE   WBC, UA 0-2  <3 WBC/hpf   RBC / HPF 0-2  <3 RBC/hpf   Bacteria, UA RARE  RARE   Urine-Other MUCOUS PRESENT    POCT PREGNANCY, URINE     Status: None   Collection Time    06/09/14  9:45 PM      Result Value Ref Range   Preg Test, Ur NEGATIVE  NEGATIVE  WET PREP, GENITAL     Status: Abnormal   Collection Time    06/09/14 10:35 PM      Result Value Ref Range   Yeast Wet Prep HPF POC FEW (*) NONE SEEN   Trich, Wet Prep NONE SEEN  NONE SEEN   Clue Cells Wet Prep HPF POC FEW (*) NONE SEEN   WBC, Wet Prep HPF POC FEW (*) NONE SEEN     MDM  23 y.o. female with cervicitis, dysuria and pelvic pain x one week. Will treat with Zithromax 1 gram PO, Diflucan 150 mg. PO and she will follow up with her PCP or return here as needed. Stable for discharge without abdominal pain on exam. I have reviewed this patient's vital signs, nurses notes, appropriate labs and discussed findings and plan of care with the patient. She voices understanding.    Medication List    TAKE these medications       naproxen sodium 550 MG tablet  Commonly known as:  ANAPROX DS  Take 1 tablet (550 mg  total) by mouth 2 (two) times daily with a meal.     nystatin-triamcinolone cream  Commonly known as:  MYCOLOG II  Apply to affected area daily      ASK your doctor about these medications       acetaminophen 325 MG tablet  Commonly known as:  TYLENOL  Take 650 mg by mouth every 6 (six) hours as needed for headache.     albuterol 108 (90 BASE)  MCG/ACT inhaler  Commonly known as:  PROVENTIL HFA;VENTOLIN HFA  Inhale 2 puffs into the lungs every 6 (six) hours as needed for wheezing.     mometasone-formoterol 100-5 MCG/ACT Aero  Commonly known as:  DULERA  Inhale 1 puff into the lungs 2 (two) times daily.

## 2014-06-11 LAB — GC/CHLAMYDIA PROBE AMP
CT Probe RNA: POSITIVE — AB
GC PROBE AMP APTIMA: NEGATIVE

## 2014-06-12 ENCOUNTER — Encounter: Payer: Self-pay | Admitting: Obstetrics & Gynecology

## 2014-06-12 DIAGNOSIS — A568 Sexually transmitted chlamydial infection of other sites: Secondary | ICD-10-CM | POA: Insufficient documentation

## 2014-06-21 ENCOUNTER — Inpatient Hospital Stay (HOSPITAL_COMMUNITY)
Admission: AD | Admit: 2014-06-21 | Discharge: 2014-06-21 | Disposition: A | Discharge status: Home / Self Care | Payer: Medicaid Other | Source: Ambulatory Visit | Attending: Obstetrics & Gynecology | Admitting: Obstetrics & Gynecology

## 2014-06-21 DIAGNOSIS — M549 Dorsalgia, unspecified: Secondary | ICD-10-CM | POA: Diagnosis not present

## 2014-06-21 DIAGNOSIS — Z87891 Personal history of nicotine dependence: Secondary | ICD-10-CM | POA: Insufficient documentation

## 2014-06-21 DIAGNOSIS — A5619 Other chlamydial genitourinary infection: Secondary | ICD-10-CM

## 2014-06-21 DIAGNOSIS — N739 Female pelvic inflammatory disease, unspecified: Secondary | ICD-10-CM

## 2014-06-21 DIAGNOSIS — N949 Unspecified condition associated with female genital organs and menstrual cycle: Secondary | ICD-10-CM | POA: Diagnosis present

## 2014-06-21 DIAGNOSIS — A568 Sexually transmitted chlamydial infection of other sites: Secondary | ICD-10-CM

## 2014-06-21 LAB — URINALYSIS, ROUTINE W REFLEX MICROSCOPIC
BILIRUBIN URINE: NEGATIVE
Glucose, UA: NEGATIVE mg/dL
Hgb urine dipstick: NEGATIVE
Ketones, ur: NEGATIVE mg/dL
Leukocytes, UA: NEGATIVE
Nitrite: NEGATIVE
PROTEIN: NEGATIVE mg/dL
Specific Gravity, Urine: 1.02 (ref 1.005–1.030)
UROBILINOGEN UA: 0.2 mg/dL (ref 0.0–1.0)
pH: 6.5 (ref 5.0–8.0)

## 2014-06-21 LAB — POCT PREGNANCY, URINE: Preg Test, Ur: NEGATIVE

## 2014-06-21 MED ORDER — CEFTRIAXONE SODIUM 250 MG IJ SOLR
250.0000 mg | Freq: Once | INTRAMUSCULAR | Status: AC
  Administered 2014-06-21: 250 mg via INTRAMUSCULAR
  Filled 2014-06-21: qty 250

## 2014-06-21 MED ORDER — AZITHROMYCIN 250 MG PO TABS
1000.0000 mg | ORAL_TABLET | ORAL | Status: AC
  Administered 2014-06-21: 1000 mg via ORAL
  Filled 2014-06-21: qty 4

## 2014-06-21 NOTE — MAU Note (Signed)
Pt reports pelvic pain, lower back pain. Pt states she had a positive CT culture and she hasn't been treated.

## 2014-06-21 NOTE — MAU Provider Note (Signed)
Chief Complaint: Pelvic Pain and Back Pain   First Provider Initiated Contact with Patient 06/21/14 2028     SUBJECTIVE HPI: Lauren Conway is a 23 y.o. G2P1011 who presents to maternity admissions reporting pelvic and back pain constant since her MAU visit on 6/25 but with increase in back pain in the last week with onset of nausea without vomiting.  On 6/25 her cultures were positive for chlamydia.  She was seen in MAU as a [facility] pt and did not receive a phone call notifying her of the positive test result.  She found the diagnosis on MyChart and called [facility].  Since she had not been seen there in 1 year, so she was sent to hospital for treatment.  They will not accept her insurance currently.  She denies vaginal itching/burning, urinary symptoms, h/a, dizziness, or fever/chills.     Past Medical History  Diagnosis Date  . History of gonorrhea     12/2008; 09/2010;   . History of chlamydia     12/2008; 03/09/2009  . Asthma    Past Surgical History  Procedure Laterality Date  . No past surgeries     History   Social History  . Marital Status: Single    Spouse Name: N/A    Number of Children: N/A  . Years of Education: N/A   Occupational History  . Not on file.   Social History Main Topics  . Smoking status: Former Smoker -- 0.25 packs/day  . Smokeless tobacco: Never Used  . Alcohol Use: No     Comment: Rare  . Drug Use: No  . Sexual Activity: Yes    Partners: Male    Birth Control/ Protection: None     Comment: Pt wants to use Depo Provera for birth control.   Other Topics Concern  . Not on file   Social History Narrative  . No narrative on file   No current facility-administered medications on file prior to encounter.   Current Outpatient Prescriptions on File Prior to Encounter  Medication Sig Dispense Refill  . acetaminophen (TYLENOL) 325 MG tablet Take 650 mg by mouth every 6 (six) hours as needed for headache.      . mometasone-formoterol (DULERA) 100-5  MCG/ACT AERO Inhale 1 puff into the lungs 2 (two) times daily.      . naproxen sodium (ANAPROX DS) 550 MG tablet Take 1 tablet (550 mg total) by mouth 2 (two) times daily with a meal.  15 tablet  0  . albuterol (PROVENTIL HFA;VENTOLIN HFA) 108 (90 BASE) MCG/ACT inhaler Inhale 2 puffs into the lungs every 6 (six) hours as needed for wheezing.       Allergies  Allergen Reactions  . Latex Itching and Swelling    ROS: Pertinent items in HPI  OBJECTIVE Blood pressure 129/67, pulse 71, temperature 98.4 F (36.9 C), temperature source Oral, resp. rate 16, height 5\' 5"  (1.651 m), weight 85.276 kg (188 lb), last menstrual period 05/23/2014, SpO2 100.00%. GENERAL: Well-developed, well-nourished female in no acute distress.  HEENT: Normocephalic HEART: normal rate RESP: normal effort ABDOMEN: Soft, mild bilateral inguinal tenderness Musculosketal: Negative CVA tenderness, mild tenderness in low back and right and left flank EXTREMITIES: Nontender, no edema NEURO: Alert and oriented SPECULUM EXAM: Deferred  LAB RESULTS Results for orders placed during the hospital encounter of 06/21/14 (from the past 24 hour(s))  URINALYSIS, ROUTINE W REFLEX MICROSCOPIC     Status: None   Collection Time    06/21/14  7:22 PM  Result Value Ref Range   Color, Urine YELLOW  YELLOW   APPearance CLEAR  CLEAR   Specific Gravity, Urine 1.020  1.005 - 1.030   pH 6.5  5.0 - 8.0   Glucose, UA NEGATIVE  NEGATIVE mg/dL   Hgb urine dipstick NEGATIVE  NEGATIVE   Bilirubin Urine NEGATIVE  NEGATIVE   Ketones, ur NEGATIVE  NEGATIVE mg/dL   Protein, ur NEGATIVE  NEGATIVE mg/dL   Urobilinogen, UA 0.2  0.0 - 1.0 mg/dL   Nitrite NEGATIVE  NEGATIVE   Leukocytes, UA NEGATIVE  NEGATIVE  POCT PREGNANCY, URINE     Status: None   Collection Time    06/21/14  7:40 PM      Result Value Ref Range   Preg Test, Ur NEGATIVE  NEGATIVE    ASSESSMENT 1. Chlamydia trachomatis infection     PLAN Treatment in MAU for  chlamydia and additional treatment for PID r/t worsening symptoms and length of time with untreated infection. Also pt concerned about exposure to other STDs. Discharge home Azithromycin 1g x1 additional dose in 1 week to complete PID treatment F/U in [facility] or [facility] as needed for Gyn care Return to MAU as needed for emergencies    Medication List         acetaminophen 325 MG tablet  Commonly known as:  TYLENOL  Take 650 mg by mouth every 6 (six) hours as needed for headache.     albuterol 108 (90 BASE) MCG/ACT inhaler  Commonly known as:  PROVENTIL HFA;VENTOLIN HFA  Inhale 2 puffs into the lungs every 6 (six) hours as needed for wheezing.     mometasone-formoterol 100-5 MCG/ACT Aero  Commonly known as:  DULERA  Inhale 1 puff into the lungs 2 (two) times daily.     naproxen sodium 550 MG tablet  Commonly known as:  ANAPROX DS  Take 1 tablet (550 mg total) by mouth 2 (two) times daily with a meal.       Follow-up Information   [facility] [place] (As needed)    Contact information:   [address] [place] [address] [phone]      [facility] (As needed)    Specialty:  Obstetrics and Gynecology   Contact information:   [address] [place] [address] [phone]      [facility] with [facility]. (As needed for emergencies)    Contact information:   [address] [id number] [place] [address] [phone]      Sharen CounterLisa Leftwich-Kirby Certified Nurse-Midwife 06/21/2014  9:06 PM

## 2014-06-21 NOTE — Discharge Instructions (Signed)
Pelvic Inflammatory Disease °Pelvic inflammatory disease (PID) refers to an infection in some or all of the female organs. The infection can be in the uterus, ovaries, fallopian tubes, or the surrounding tissues in the pelvis. PID can cause abdominal or pelvic pain that comes on suddenly (acute pelvic pain). PID is a serious infection because it can lead to lasting (chronic) pelvic pain or the inability to have children (infertile).  °CAUSES  °The infection is often caused by the normal bacteria found in the vaginal tissues. PID may also be caused by an infection that is spread during sexual contact. PID can also occur following:  °· The birth of a baby.   °· A miscarriage.   °· An abortion.   °· Major pelvic surgery.   °· The use of an intrauterine device (IUD).   °· A sexual assault.   °RISK FACTORS °Certain factors can put a person at higher risk for PID, such as: °· Being younger than 25 years. °· Being sexually active at a young age. °· Using nonbarrier contraception. °· Having multiple sexual partners. °· Having sex with someone who has symptoms of a genital infection. °· Using oral contraception. °Other times, certain behaviors can increase the possibility of getting PID, such as: °· Having sex during your period. °· Using a vaginal douche. °· Having an intrauterine device (IUD) in place. °SYMPTOMS  °· Abdominal or pelvic pain.   °· Fever.   °· Chills.   °· Abnormal vaginal discharge. °· Abnormal uterine bleeding.   °· Unusual pain shortly after finishing your period. °DIAGNOSIS  °Your caregiver will choose some of the following methods to make a diagnosis, such as:  °· Performing a physical exam and history. A pelvic exam typically reveals a very tender uterus and surrounding pelvis.   °· Ordering laboratory tests including a pregnancy test, blood tests, and urine test.  °· Ordering cultures of the vagina and cervix to check for a sexually transmitted infection (STI). °· Performing an ultrasound.    °· Performing a laparoscopic procedure to look inside the pelvis.   °TREATMENT  °· Antibiotic medicines may be prescribed and taken by mouth.   °· Sexual partners may be treated when the infection is caused by a sexually transmitted disease (STD).   °· Hospitalization may be needed to give antibiotics intravenously. °· Surgery may be needed, but this is rare. °It may take weeks until you are completely well. If you are diagnosed with PID, you should also be checked for human immunodeficiency virus (HIV).   °HOME CARE INSTRUCTIONS  °· If given, take your antibiotics as directed. Finish the medicine even if you start to feel better.   °· Only take over-the-counter or prescription medicines for pain, discomfort, or fever as directed by your caregiver.   °· Do not have sexual intercourse until treatment is completed or as directed by your caregiver. If PID is confirmed, your recent sexual partner(s) will need treatment.   °· Keep your follow-up appointments. °SEEK MEDICAL CARE IF:  °· You have increased or abnormal vaginal discharge.   °· You need prescription medicine for your pain.   °· You vomit.   °· You cannot take your medicines.   °· Your partner has an STD.   °SEEK IMMEDIATE MEDICAL CARE IF:  °· You have a fever.   °· You have increased abdominal or pelvic pain.   °· You have chills.   °· You have pain when you urinate.   °· You are not better after 72 hours following treatment.   °MAKE SURE YOU:  °· Understand these instructions. °· Will watch your condition. °· Will get help right away if you are not doing well or get worse. °  Document Released: 12/02/2005 Document Revised: 03/29/2013 Document Reviewed: 11/28/2011 °ExitCare® Patient Information ©2015 [employer]. This information is not intended to replace advice given to you by your health care provider. Make sure you discuss any questions you have with your health care provider. ° °

## 2014-06-21 NOTE — MAU Note (Addendum)
PT SAYS SHE  WAS HERE ON 6-25-  HAD POSTIVE CX-  SAW  DR J-M   July- 2014.       SHE LOOKED ON MY CHART-  SAW    POSITIVE CX.    STILL HAS PELVIC PAIN AND  BACK PAIN   NO BIRTH CONTROL.  LAST SEX-  6-24.

## 2014-06-27 NOTE — MAU Provider Note (Signed)
Attestation of Attending Supervision of Advanced Practitioner (CNM/NP): Evaluation and management procedures were performed by the Advanced Practitioner under my supervision and collaboration. I have reviewed the Advanced Practitioner's note and chart, and I agree with the management and plan.  Willer Osorno H. 9:36 AM

## 2014-08-24 ENCOUNTER — Encounter (HOSPITAL_COMMUNITY): Payer: Self-pay | Admitting: *Deleted

## 2014-08-24 ENCOUNTER — Inpatient Hospital Stay (HOSPITAL_COMMUNITY)
Admission: AD | Admit: 2014-08-24 | Discharge: 2014-08-24 | Disposition: A | Discharge status: Home / Self Care | Payer: Medicaid Other | Source: Ambulatory Visit | Attending: Family Medicine | Admitting: Family Medicine

## 2014-08-24 DIAGNOSIS — R102 Pelvic and perineal pain: Secondary | ICD-10-CM

## 2014-08-24 DIAGNOSIS — R109 Unspecified abdominal pain: Secondary | ICD-10-CM | POA: Diagnosis present

## 2014-08-24 DIAGNOSIS — N949 Unspecified condition associated with female genital organs and menstrual cycle: Secondary | ICD-10-CM | POA: Diagnosis not present

## 2014-08-24 DIAGNOSIS — Z87891 Personal history of nicotine dependence: Secondary | ICD-10-CM | POA: Diagnosis not present

## 2014-08-24 LAB — URINALYSIS, ROUTINE W REFLEX MICROSCOPIC
BILIRUBIN URINE: NEGATIVE
Glucose, UA: NEGATIVE mg/dL
HGB URINE DIPSTICK: NEGATIVE
Ketones, ur: NEGATIVE mg/dL
Leukocytes, UA: NEGATIVE
Nitrite: NEGATIVE
Protein, ur: NEGATIVE mg/dL
Specific Gravity, Urine: 1.025 (ref 1.005–1.030)
UROBILINOGEN UA: 0.2 mg/dL (ref 0.0–1.0)
pH: 5.5 (ref 5.0–8.0)

## 2014-08-24 LAB — HIV ANTIBODY (ROUTINE TESTING W REFLEX): HIV: NONREACTIVE

## 2014-08-24 LAB — WET PREP, GENITAL
Trich, Wet Prep: NONE SEEN
Yeast Wet Prep HPF POC: NONE SEEN

## 2014-08-24 LAB — POCT PREGNANCY, URINE: Preg Test, Ur: NEGATIVE

## 2014-08-24 MED ORDER — KETOROLAC TROMETHAMINE 60 MG/2ML IM SOLN
60.0000 mg | Freq: Once | INTRAMUSCULAR | Status: AC
  Administered 2014-08-24: 60 mg via INTRAMUSCULAR
  Filled 2014-08-24: qty 2

## 2014-08-24 MED ORDER — KETOROLAC TROMETHAMINE 10 MG PO TABS: 10.0000 mg | ORAL_TABLET | Freq: Four times a day (QID) | ORAL | Status: DC | PRN

## 2014-08-24 NOTE — MAU Note (Addendum)
PT SAYS   SHE STARTED HAVING   LOWER ABD PAIN   AND IN MIDDLE OF ABD.  ON Monday-  THEN STOPPED ON Tuesday  AND HAD SEX-   AND STARTED AGAIN.    TOOK MOTRIN AND NAPROXYN   - WITH NO RELIEF.    NO BIRTH CONTROL.  DR Tamela Oddi-   SEEN LAST 06-2013.       HAS   NOT DONE HPT

## 2014-08-24 NOTE — MAU Provider Note (Signed)
  History     CSN: [id number]  Arrival date and time: 08/24/14 4098   First Provider Initiated Contact with Patient 08/24/14 0456      No chief complaint on file.  HPI  Lauren Conway is a 23 y.o. G2P1011 who presents today with lower abdominal pain. She states that the pain started on Monday, and then was better and returned yesterday. She states that her LMP was 08/15/14. She has tried taking aleve and tylenol, but they have not helped the pain. She denies any vaginal discharge or odor or irritation.   Past Medical History  Diagnosis Date  . History of gonorrhea     12/2008; 09/2010;   . History of chlamydia     12/2008; 03/09/2009  . Asthma     Past Surgical History  Procedure Laterality Date  . No past surgeries      Family History  Problem Relation Age of Onset  . Other Neg Hx   . Hypertension Mother   . Asthma Maternal Grandmother     History  Substance Use Topics  . Smoking status: Former Smoker -- 0.25 packs/day  . Smokeless tobacco: Never Used  . Alcohol Use: No     Comment: Rare    Allergies:  Allergies  Allergen Reactions  . Latex Itching and Swelling    Prescriptions prior to admission  Medication Sig Dispense Refill  . acetaminophen (TYLENOL) 325 MG tablet Take 650 mg by mouth every 6 (six) hours as needed for headache.      . albuterol (PROVENTIL HFA;VENTOLIN HFA) 108 (90 BASE) MCG/ACT inhaler Inhale 2 puffs into the lungs every 6 (six) hours as needed for wheezing.      . naproxen sodium (ANAPROX DS) 550 MG tablet Take 1 tablet (550 mg total) by mouth 2 (two) times daily with a meal.  15 tablet  0  . mometasone-formoterol (DULERA) 100-5 MCG/ACT AERO Inhale 1 puff into the lungs 2 (two) times daily.        ROS Physical Exam   Blood pressure 134/81, pulse 67, temperature 97.5 F (36.4 C), temperature source Oral, resp. rate 18, height  (1.6 m), weight 86.807 kg (191 lb 6 oz), last menstrual period 08/15/2014.  Physical Exam  Nursing  note and vitals reviewed. Constitutional: She is oriented to person, place, and time. She appears well-developed and well-nourished. No distress.  Cardiovascular: Normal rate.   Respiratory: Effort normal.  GI: Soft. There is no tenderness. There is no rebound.  Genitourinary:   External: no lesion Vagina: small amount of white discharge Cervix: pink, smooth, no CMT Uterus: NSSC Adnexa: NT   Neurological: She is alert and oriented to person, place, and time.  Skin: Skin is warm and dry.  Psychiatric: She has a normal mood and affect.   MAU Course  Procedures  Assessment and Plan   1. Pelvic pain in female    GC/CT pending UC pending Return to MAU as needed RX: toradol  #20  Follow-up Information   Schedule an appointment as soon as possible for a visit with [facility].   Specialty:  Obstetrics and Gynecology   Contact information:   [address] [place] [address] [phone]       Tawnya Crook 08/24/2014, 4:58 AM

## 2014-08-24 NOTE — Discharge Instructions (Signed)
Pelvic Pain Female pelvic pain can be caused by many different things and start from a variety of places. Pelvic pain refers to pain that is located in the lower half of the abdomen and between your hips. The pain may occur over a short period of time (acute) or may be reoccurring (chronic). The cause of pelvic pain may be related to disorders affecting the female reproductive organs (gynecologic), but it may also be related to the bladder, kidney stones, an intestinal complication, or muscle or skeletal problems. Getting help right away for pelvic pain is important, especially if there has been severe, sharp, or a sudden onset of unusual pain. It is also important to get help right away because some types of pelvic pain can be life threatening.  CAUSES  Below are only some of the causes of pelvic pain. The causes of pelvic pain can be in one of several categories.   Gynecologic.  Pelvic inflammatory disease.  Sexually transmitted infection.  Ovarian cyst or a twisted ovarian ligament (ovarian torsion).  Uterine lining that grows outside the uterus (endometriosis).  Fibroids, cysts, or tumors.  Ovulation.  Pregnancy.  Pregnancy that occurs outside the uterus (ectopic pregnancy).  Miscarriage.  Labor.  Abruption of the placenta or ruptured uterus.  Infection.  Uterine infection (endometritis).  Bladder infection.  Diverticulitis.  Miscarriage related to a uterine infection (septic abortion).  Bladder.  Inflammation of the bladder (cystitis).  Kidney stone(s).  Gastrointestinal.  Constipation.  Diverticulitis.  Neurologic.  Trauma.  Feeling pelvic pain because of mental or emotional causes (psychosomatic).  Cancers of the bowel or pelvis. EVALUATION  Your caregiver will want to take a careful history of your concerns. This includes recent changes in your health, a careful gynecologic history of your periods (menses), and a sexual history. Obtaining your family  history and medical history is also important. Your caregiver may suggest a pelvic exam. A pelvic exam will help identify the location and severity of the pain. It also helps in the evaluation of which organ system may be involved. In order to identify the cause of the pelvic pain and be properly treated, your caregiver may order tests. These tests may include:   A pregnancy test.  Pelvic ultrasonography.  An X-ray exam of the abdomen.  A urinalysis or evaluation of vaginal discharge.  Blood tests. HOME CARE INSTRUCTIONS   Only take over-the-counter or prescription medicines for pain, discomfort, or fever as directed by your caregiver.   Rest as directed by your caregiver.   Eat a balanced diet.   Drink enough fluids to make your urine clear or pale yellow, or as directed.   Avoid sexual intercourse if it causes pain.   Apply warm or cold compresses to the lower abdomen depending on which one helps the pain.   Avoid stressful situations.   Keep a journal of your pelvic pain. Write down when it started, where the pain is located, and if there are things that seem to be associated with the pain, such as food or your menstrual cycle.  Follow up with your caregiver as directed.  SEEK MEDICAL CARE IF:  Your medicine does not help your pain.  You have abnormal vaginal discharge. SEEK IMMEDIATE MEDICAL CARE IF:   You have heavy bleeding from the vagina.   Your pelvic pain increases.   You feel light-headed or faint.   You have chills.   You have pain with urination or blood in your urine.   You have uncontrolled diarrhea   or vomiting.   You have a fever or persistent symptoms for more than 3 days.  You have a fever and your symptoms suddenly get worse.   You are being physically or sexually abused.  MAKE SURE YOU:  Understand these instructions.  Will watch your condition.  Will get help if you are not doing well or get worse. Document Released:  10/29/2004 Document Revised: 04/18/2014 Document Reviewed: 03/23/2012 ExitCare Patient Information 2015 [employer]. This information is not intended to replace advice given to you by your health care provider. Make sure you discuss any questions you have with your health care provider.  

## 2014-08-25 LAB — URINE CULTURE: Colony Count: 40000

## 2014-08-25 LAB — GC/CHLAMYDIA PROBE AMP
CT PROBE, AMP APTIMA: NEGATIVE
GC PROBE AMP APTIMA: NEGATIVE

## 2014-08-25 NOTE — MAU Provider Note (Signed)
Attestation of Attending Supervision of Advanced Practitioner (PA/CNM/NP): Evaluation and management procedures were performed by the Advanced Practitioner under my supervision and collaboration.  I have reviewed the Advanced Practitioner's note and chart, and I agree with the management and plan.  Iara Monds, DO Attending Physician Faculty Practice, [facility]  

## 2014-08-27 ENCOUNTER — Encounter (HOSPITAL_COMMUNITY): Payer: Self-pay | Admitting: Emergency Medicine

## 2014-08-27 ENCOUNTER — Emergency Department (HOSPITAL_COMMUNITY)
Admission: EM | Admit: 2014-08-27 | Discharge: 2014-08-27 | Disposition: A | Discharge status: Home / Self Care | Payer: Medicaid Other | Attending: Emergency Medicine | Admitting: Emergency Medicine

## 2014-08-27 DIAGNOSIS — Z87891 Personal history of nicotine dependence: Secondary | ICD-10-CM | POA: Diagnosis not present

## 2014-08-27 DIAGNOSIS — Z8619 Personal history of other infectious and parasitic diseases: Secondary | ICD-10-CM | POA: Insufficient documentation

## 2014-08-27 DIAGNOSIS — J45901 Unspecified asthma with (acute) exacerbation: Secondary | ICD-10-CM | POA: Insufficient documentation

## 2014-08-27 DIAGNOSIS — Z91040 Latex allergy status: Secondary | ICD-10-CM | POA: Diagnosis not present

## 2014-08-27 DIAGNOSIS — Z79899 Other long term (current) drug therapy: Secondary | ICD-10-CM | POA: Insufficient documentation

## 2014-08-27 DIAGNOSIS — J4521 Mild intermittent asthma with (acute) exacerbation: Secondary | ICD-10-CM

## 2014-08-27 DIAGNOSIS — IMO0002 Reserved for concepts with insufficient information to code with codable children: Secondary | ICD-10-CM | POA: Diagnosis not present

## 2014-08-27 MED ORDER — IPRATROPIUM-ALBUTEROL 0.5-2.5 (3) MG/3ML IN SOLN
3.0000 mL | Freq: Once | RESPIRATORY_TRACT | Status: AC
  Administered 2014-08-27: 3 mL via RESPIRATORY_TRACT
  Filled 2014-08-27: qty 3

## 2014-08-27 MED ORDER — AEROCHAMBER PLUS MISC: Status: DC

## 2014-08-27 MED ORDER — ALBUTEROL SULFATE HFA 108 (90 BASE) MCG/ACT IN AERS: 1.0000 | INHALATION_SPRAY | RESPIRATORY_TRACT | Status: DC | PRN

## 2014-08-27 MED ORDER — PREDNISONE 20 MG PO TABS: 40.0000 mg | ORAL_TABLET | Freq: Every day | ORAL | Status: DC

## 2014-08-27 MED ORDER — PREDNISONE 20 MG PO TABS
60.0000 mg | ORAL_TABLET | Freq: Once | ORAL | Status: AC
  Administered 2014-08-27: 60 mg via ORAL
  Filled 2014-08-27: qty 3

## 2014-08-27 NOTE — ED Notes (Signed)
Pt reports having an asthma attack that started at 11:00 today. Pt reports having a history of asthma, and last attack was one month ago. Pt denies taking any medication and states that she does not have a rescue inhaler. Pt is speaking in full sentences with an oxygen saturation of 100% on room air. Pt has wheezing bilaterally, however is in NAD.

## 2014-08-27 NOTE — ED Provider Notes (Signed)
CSN: [id number]     Arrival date & time 08/27/14  1223 History  This chart was scribed for non-physician practitioner, Francee Piccolo, PA-C working with Toy Cookey, MD by Greggory Stallion, ED scribe. This patient was seen in room WTR5/WTR5 and the patient's care was started at 12:52 PM.   Chief Complaint  Patient presents with  . Asthma   The history is provided by the patient. No language interpreter was used.   HPI Comments: Kanon Colunga is a 23 y.o. female with history of asthma who presents to the Emergency Department complaining of an asthma attack that started at 11 AM today. Reports wheezing. Pt states she does not have a rescue inhaler at home. Denies fever, rhinorrhea, cough. Pt has never been admitted for her asthma in the past.   Past Medical History  Diagnosis Date  . History of gonorrhea     12/2008; 09/2010;   . History of chlamydia     12/2008; 03/09/2009  . Asthma    Past Surgical History  Procedure Laterality Date  . No past surgeries     Family History  Problem Relation Age of Onset  . Other Neg Hx   . Hypertension Mother   . Asthma Maternal Grandmother    History  Substance Use Topics  . Smoking status: Former Smoker -- 0.25 packs/day  . Smokeless tobacco: Never Used  . Alcohol Use: No     Comment: Rare   OB History   Grav Para Term Preterm Abortions TAB SAB Ect Mult Living   Review of Systems  Constitutional: Negative for fever.  HENT: Negative for rhinorrhea.   Respiratory: Positive for wheezing. Negative for cough.   All other systems reviewed and are negative.  Allergies  Latex  Home Medications   Prior to Admission medications   Medication Sig Start Date End Date Taking? Authorizing Provider  acetaminophen (TYLENOL) 325 MG tablet Take 650 mg by mouth every 6 (six) hours as needed for headache.   Yes Historical Provider, MD  albuterol (PROVENTIL HFA;VENTOLIN HFA) 108 (90 BASE) MCG/ACT inhaler Inhale 2 puffs  into the lungs every 6 (six) hours as needed for wheezing.   Yes Historical Provider, MD  ketorolac (TORADOL) 10 MG tablet Take 10 mg by mouth every 6 (six) hours as needed for moderate pain or severe pain.   Yes Historical Provider, MD  mometasone-formoterol (DULERA) 100-5 MCG/ACT AERO Inhale 1 puff into the lungs 2 (two) times daily.   Yes Historical Provider, MD  albuterol (PROVENTIL HFA;VENTOLIN HFA) 108 (90 BASE) MCG/ACT inhaler Inhale 1-2 puffs into the lungs every 4 (four) hours as needed for wheezing or shortness of breath. 08/27/14   Ossie Beltran L Lukasz Rogus, PA-C  predniSONE (DELTASONE) 20 MG tablet Take 2 tablets (40 mg total) by mouth daily. 08/27/14   Roanna Reaves L Chandni Gagan, PA-C  Spacer/Aero-Holding Chambers (AEROCHAMBER PLUS) inhaler Use as instructed 08/27/14   Lise Auer Talley Kreiser, PA-C   BP 121/71  Pulse 98  Temp(Src) 97.9 F (36.6 C) (Oral)  Resp 20  SpO2 100%  LMP 08/15/2014  Physical Exam  Nursing note and vitals reviewed. Constitutional: She is oriented to person, place, and time. She appears well-developed and well-nourished. No distress.  HENT:  Head: Normocephalic and atraumatic.  Right Ear: External ear normal.  Left Ear: External ear normal.  Nose: Nose normal.  Mouth/Throat: Oropharynx is clear and moist.  Eyes: Conjunctivae are normal.  Neck: Normal  range of motion. Neck supple.  Cardiovascular: Normal rate, regular rhythm and normal heart sounds.   Pulmonary/Chest: Effort normal. No accessory muscle usage. Not tachypneic. No respiratory distress. She has wheezes (mild inspiratory and expiratory). She has no rales.  Abdominal: Soft.  Musculoskeletal: Normal range of motion.  Neurological: She is alert and oriented to person, place, and time.  Skin: Skin is warm and dry. She is not diaphoretic.  Psychiatric: She has a normal mood and affect.    ED Course  Procedures (including critical care time) Medications  ipratropium-albuterol (DUONEB) 0.5-2.5 (3)  MG/3ML nebulizer solution 3 mL (3 mLs Nebulization Given 08/27/14 1249)  predniSONE (DELTASONE) tablet 60 mg (60 mg Oral Given 08/27/14 1326)  ipratropium-albuterol (DUONEB) 0.5-2.5 (3) MG/3ML nebulizer solution 3 mL (3 mLs Nebulization Given 08/27/14 1326)    DIAGNOSTIC STUDIES: Oxygen Saturation is 100% on RA, normal by my interpretation.    COORDINATION OF CARE: 12:54 PM-Discussed treatment plan which includes a breathing treatment with pt at bedside and pt agreed to plan.  1:16 PM-Upon recheck, pt states she is feeling a little better. Will give one more breathing treatment and discharge home with an inhaler.   Labs Review Labs Reviewed - No data to display  Imaging Review No results found.   EKG Interpretation None      On re-evaluation wheezing improved. Mild expiratory wheeze after two Duoneb treatments. Patient endorses improvement.   MDM   Final diagnoses:  Asthma exacerbation attacks, mild intermittent    Filed Vitals:   08/27/14 1352  BP:   Pulse: 98  Temp:   Resp:    Afebrile, NAD, non-toxic appearing, AAOx4.  Patient maintained O2 saturations maintained >90, no current signs of respiratory distress. Lung exam improved after nebulizer treatment. Prednisone given in the ED and pt will bd dc with 5 day burst. Pt states they are breathing at baseline. Pt has been instructed to continue using prescribed medications and to speak with PCP about today's exacerbation. Patient is stable at time of discharge    I personally performed the services described in this documentation, which was scribed in my presence. The recorded information has been reviewed and is accurate.  Lise Auer Zarie Kosiba, PA-C 08/27/14 1400

## 2014-08-27 NOTE — Discharge Instructions (Signed)
Please follow up with your primary care physician in 1-2 days. If you do not have one please call the [facility] and wellness Center number listed above. Please use your inhaler 2 puffs every 4-6 hours as needed for shortness of breath, wheezing, cough. Please take prednisone as prescribed. Please read all discharge instructions and return precautions.    Asthma, Acute Bronchospasm Acute bronchospasm caused by asthma is also referred to as an asthma attack. Bronchospasm means your air passages become narrowed. The narrowing is caused by inflammation and tightening of the muscles in the air tubes (bronchi) in your lungs. This can make it hard to breathe or cause you to wheeze and cough. CAUSES Possible triggers are:  Animal dander from the skin, hair, or feathers of animals.  Dust mites contained in house dust.  Cockroaches.  Pollen from trees or grass.  Mold.  Cigarette or tobacco smoke.  Air pollutants such as dust, household cleaners, hair sprays, aerosol sprays, paint fumes, strong chemicals, or strong odors.  Cold air or weather changes. Cold air may trigger inflammation. Winds increase molds and pollens in the air.  Strong emotions such as crying or laughing hard.  Stress.  Certain medicines such as aspirin or beta-blockers.  Sulfites in foods and drinks, such as dried fruits and wine.  Infections or inflammatory conditions, such as a flu, cold, or inflammation of the nasal membranes (rhinitis).  Gastroesophageal reflux disease (GERD). GERD is a condition where stomach acid backs up into your esophagus.  Exercise or strenuous activity. SIGNS AND SYMPTOMS   Wheezing.  Excessive coughing, particularly at night.  Chest tightness.  Shortness of breath. DIAGNOSIS  Your health care provider will ask you about your medical history and perform a physical exam. A chest X-ray or blood testing may be performed to look for other causes of your symptoms or other conditions  that may have triggered your asthma attack. TREATMENT  Treatment is aimed at reducing inflammation and opening up the airways in your lungs. Most asthma attacks are treated with inhaled medicines. These include quick relief or rescue medicines (such as bronchodilators) and controller medicines (such as inhaled corticosteroids). These medicines are sometimes given through an inhaler or a nebulizer. Systemic steroid medicine taken by mouth or given through an IV tube also can be used to reduce the inflammation when an attack is moderate or severe. Antibiotic medicines are only used if a bacterial infection is present.  HOME CARE INSTRUCTIONS   Rest.  Drink plenty of liquids. This helps the mucus to remain thin and be easily coughed up. Only use caffeine in moderation and do not use alcohol until you have recovered from your illness.  Do not smoke. Avoid being exposed to secondhand smoke.  You play a critical role in keeping yourself in good health. Avoid exposure to things that cause you to wheeze or to have breathing problems.  Keep your medicines up-to-date and available. Carefully follow your health care provider's treatment plan.  Take your medicine exactly as prescribed.  When pollen or pollution is bad, keep windows closed and use an air conditioner or go to places with air conditioning.  Asthma requires careful medical care. See your health care provider for a follow-up as advised. If you are more than [redacted] weeks pregnant and you were prescribed any new medicines, let your obstetrician know about the visit and how you are doing. Follow up with your health care provider as directed.  After you have recovered from your asthma attack, make  an appointment with your outpatient doctor to talk about ways to reduce the likelihood of future attacks. If you do not have a doctor who manages your asthma, make an appointment with a primary care doctor to discuss your asthma. SEEK IMMEDIATE MEDICAL CARE  IF:   You are getting worse.  You have trouble breathing. If severe, call your local emergency services (911 in the [place]  You develop chest pain or discomfort.  You are vomiting.  You are not able to keep fluids down.  You are coughing up yellow, green, brown, or bloody sputum.  You have a fever and your symptoms suddenly get worse.  You have trouble swallowing. MAKE SURE YOU:   Understand these instructions.  Will watch your condition.  Will get help right away if you are not doing well or get worse. Document Released: 03/19/2007 Document Revised: 12/07/2013 Document Reviewed: 06/09/2013 [facility] Patient Information 2015 [place], [place]. This information is not intended to replace advice given to you by your health care provider. Make sure you discuss any questions you have with your health care provider.

## 2014-08-28 NOTE — ED Provider Notes (Signed)
Medical screening examination/treatment/procedure(s) were performed by non-physician practitioner and as supervising physician I was immediately available for consultation/collaboration.  Megan Docherty, MD 08/28/14 1916 

## 2014-09-30 ENCOUNTER — Other Ambulatory Visit: Payer: Self-pay

## 2014-10-17 ENCOUNTER — Encounter (HOSPITAL_COMMUNITY): Payer: Self-pay | Admitting: Emergency Medicine

## 2014-10-30 ENCOUNTER — Emergency Department (HOSPITAL_COMMUNITY)
Admission: EM | Admit: 2014-10-30 | Discharge: 2014-10-31 | Disposition: A | Discharge status: Home / Self Care | Payer: Medicaid Other | Attending: Emergency Medicine | Admitting: Emergency Medicine

## 2014-10-30 ENCOUNTER — Encounter (HOSPITAL_COMMUNITY): Payer: Self-pay | Admitting: *Deleted

## 2014-10-30 DIAGNOSIS — Z91040 Latex allergy status: Secondary | ICD-10-CM | POA: Insufficient documentation

## 2014-10-30 DIAGNOSIS — J45909 Unspecified asthma, uncomplicated: Secondary | ICD-10-CM | POA: Diagnosis present

## 2014-10-30 DIAGNOSIS — Z79899 Other long term (current) drug therapy: Secondary | ICD-10-CM | POA: Insufficient documentation

## 2014-10-30 DIAGNOSIS — Z87891 Personal history of nicotine dependence: Secondary | ICD-10-CM | POA: Diagnosis not present

## 2014-10-30 DIAGNOSIS — Z7951 Long term (current) use of inhaled steroids: Secondary | ICD-10-CM | POA: Insufficient documentation

## 2014-10-30 DIAGNOSIS — Z8619 Personal history of other infectious and parasitic diseases: Secondary | ICD-10-CM | POA: Insufficient documentation

## 2014-10-30 DIAGNOSIS — J4520 Mild intermittent asthma, uncomplicated: Secondary | ICD-10-CM

## 2014-10-30 DIAGNOSIS — J4521 Mild intermittent asthma with (acute) exacerbation: Secondary | ICD-10-CM | POA: Diagnosis not present

## 2014-10-30 MED ORDER — ALBUTEROL SULFATE (2.5 MG/3ML) 0.083% IN NEBU
5.0000 mg | INHALATION_SOLUTION | Freq: Once | RESPIRATORY_TRACT | Status: AC
  Administered 2014-10-30: 5 mg via RESPIRATORY_TRACT
  Filled 2014-10-30: qty 6

## 2014-10-30 NOTE — ED Provider Notes (Signed)
CSN: [id number]     Arrival date & time 10/30/14  2326 History   None    Chief Complaint  Patient presents with  . Asthma     (Consider location/radiation/quality/duration/timing/severity/associated sxs/prior Treatment) HPI    Lauren Conway is a 23 y.o. female with exacerbation of asthma for 2 days. Wheezing is described as mild. Associated symptoms:congestion and sore throat. Current asthma medications: Proventil MDI (or generic equivalent) which patient used the last of just prior to arrival. Patient does not smoke cigarettes.     Past Medical History  Diagnosis Date  . History of gonorrhea     12/2008; 09/2010;   . History of chlamydia     12/2008; 03/09/2009  . Asthma    Past Surgical History  Procedure Laterality Date  . No past surgeries     Family History  Problem Relation Age of Onset  . Other Neg Hx   . Hypertension Mother   . Asthma Maternal Grandmother    History  Substance Use Topics  . Smoking status: Former Smoker -- 0.25 packs/day  . Smokeless tobacco: Never Used  . Alcohol Use: No     Comment: Rare   OB History    Gravida Para Term Preterm AB TAB SAB Ectopic Multiple Living   2 1 1  1  1   1      Review of Systems  Constitutional: Negative for fever and chills.  HENT: Positive for rhinorrhea and sore throat.   Respiratory: Positive for wheezing.   Gastrointestinal: Negative for nausea and vomiting.     Allergies  Latex  Home Medications   Prior to Admission medications   Medication Sig Start Date End Date Taking? Authorizing Provider  acetaminophen (TYLENOL) 325 MG tablet Take 650 mg by mouth every 6 (six) hours as needed for headache.    Historical Provider, MD  albuterol (PROVENTIL HFA;VENTOLIN HFA) 108 (90 BASE) MCG/ACT inhaler Inhale 2 puffs into the lungs every 6 (six) hours as needed for wheezing.    Historical Provider, MD  albuterol (PROVENTIL HFA;VENTOLIN HFA) 108 (90 BASE) MCG/ACT inhaler Inhale 1-2 puffs into the lungs  every 4 (four) hours as needed for wheezing or shortness of breath. 08/27/14   Jennifer L Piepenbrink, PA-C  ketorolac (TORADOL) 10 MG tablet Take 10 mg by mouth every 6 (six) hours as needed for moderate pain or severe pain.    Historical Provider, MD  mometasone-formoterol (DULERA) 100-5 MCG/ACT AERO Inhale 1 puff into the lungs 2 (two) times daily.    Historical Provider, MD  predniSONE (DELTASONE) 20 MG tablet Take 2 tablets (40 mg total) by mouth daily. 08/27/14   Lise AuerJennifer L Piepenbrink, PA-C  Spacer/Aero-Holding Chambers (AEROCHAMBER PLUS) inhaler Use as instructed 08/27/14   Victorino DikeJennifer L Piepenbrink, PA-C   BP 122/64 mmHg  Pulse 69  Temp(Src) 98.4 F (36.9 C) (Oral)  Resp 19  Ht 5\' 4"  (1.626 m)  Wt 189 lb 12.8 oz (86.093 kg)  BMI 32.56 kg/m2  SpO2 96%  LMP 10/07/2014 Physical Exam  Constitutional: She is oriented to person, place, and time. She appears well-developed and well-nourished. No distress.  HENT:  Head: Normocephalic and atraumatic.  Eyes: Conjunctivae are normal. No scleral icterus.  Neck: Normal range of motion.  Cardiovascular: Normal rate, regular rhythm and normal heart sounds.  Exam reveals no gallop and no friction rub.   No murmur heard. Pulmonary/Chest: Effort normal. No respiratory distress. She has wheezes (mild expirtatory wheeze).  Abdominal: Soft. Bowel sounds are normal. She exhibits  no distension and no mass. There is no tenderness. There is no guarding.  Neurological: She is alert and oriented to person, place, and time.  Skin: Skin is warm and dry. She is not diaphoretic.  Vitals reviewed.   ED Course  Procedures (including critical care time) Labs Review Labs Reviewed - No data to display  Imaging Review No results found.   EKG Interpretation None      MDM   Final diagnoses:  Asthma, mild intermittent, uncomplicated     No current signs of respiratory distress. Lung exam improved after nebulizer treatment. Prednisone at dc with 5 day  burst. Pt states they are breathing at baseline. Pt has been instructed to continue using prescribed medications and to speak with PCP about today's exacerbation.     Arthor CaptainAbigail Dejanae Helser, PA-C 10/31/14 [id number]  Olivia Mackielga M Otter, MD 10/31/14 [phone]

## 2014-10-30 NOTE — ED Notes (Signed)
Pt states that she has a hx of asthma; pt states that she has been bothered with her asthma all week but it has increased in the last 2 hrs; pt states that she used the last of her inhaler PTA

## 2014-10-31 MED ORDER — PREDNISONE 20 MG PO TABS: 40.0000 mg | ORAL_TABLET | Freq: Every day | ORAL | Status: DC

## 2014-10-31 MED ORDER — ALBUTEROL SULFATE HFA 108 (90 BASE) MCG/ACT IN AERS: 1.0000 | INHALATION_SPRAY | RESPIRATORY_TRACT | Status: DC | PRN

## 2014-10-31 NOTE — Discharge Instructions (Signed)

## 2014-11-17 ENCOUNTER — Encounter (HOSPITAL_COMMUNITY): Payer: Self-pay | Admitting: *Deleted

## 2014-11-17 ENCOUNTER — Inpatient Hospital Stay (HOSPITAL_COMMUNITY)
Admission: AD | Admit: 2014-11-17 | Discharge: 2014-11-17 | Disposition: A | Discharge status: Home / Self Care | Payer: Medicaid Other | Source: Ambulatory Visit | Attending: Obstetrics & Gynecology | Admitting: Obstetrics & Gynecology

## 2014-11-17 DIAGNOSIS — Z87891 Personal history of nicotine dependence: Secondary | ICD-10-CM | POA: Diagnosis not present

## 2014-11-17 DIAGNOSIS — B9689 Other specified bacterial agents as the cause of diseases classified elsewhere: Secondary | ICD-10-CM

## 2014-11-17 DIAGNOSIS — N949 Unspecified condition associated with female genital organs and menstrual cycle: Secondary | ICD-10-CM | POA: Diagnosis not present

## 2014-11-17 DIAGNOSIS — A499 Bacterial infection, unspecified: Secondary | ICD-10-CM

## 2014-11-17 DIAGNOSIS — N760 Acute vaginitis: Secondary | ICD-10-CM | POA: Diagnosis not present

## 2014-11-17 DIAGNOSIS — N898 Other specified noninflammatory disorders of vagina: Secondary | ICD-10-CM | POA: Diagnosis present

## 2014-11-17 LAB — CBC
HCT: 38.2 % (ref 36.0–46.0)
Hemoglobin: 13.1 g/dL (ref 12.0–15.0)
MCH: 31.6 pg (ref 26.0–34.0)
MCHC: 34.3 g/dL (ref 30.0–36.0)
MCV: 92 fL (ref 78.0–100.0)
PLATELETS: 359 10*3/uL (ref 150–400)
RBC: 4.15 MIL/uL (ref 3.87–5.11)
RDW: 12.4 % (ref 11.5–15.5)
WBC: 8.6 10*3/uL (ref 4.0–10.5)

## 2014-11-17 LAB — WET PREP, GENITAL
Trich, Wet Prep: NONE SEEN
YEAST WET PREP: NONE SEEN

## 2014-11-17 LAB — URINALYSIS, ROUTINE W REFLEX MICROSCOPIC
Bilirubin Urine: NEGATIVE
Glucose, UA: NEGATIVE mg/dL
HGB URINE DIPSTICK: NEGATIVE
Ketones, ur: NEGATIVE mg/dL
Leukocytes, UA: NEGATIVE
Nitrite: NEGATIVE
PH: 5 (ref 5.0–8.0)
Protein, ur: NEGATIVE mg/dL
UROBILINOGEN UA: 0.2 mg/dL (ref 0.0–1.0)

## 2014-11-17 LAB — POCT PREGNANCY, URINE: PREG TEST UR: NEGATIVE

## 2014-11-17 MED ORDER — METRONIDAZOLE 500 MG PO TABS: 500.0000 mg | ORAL_TABLET | Freq: Two times a day (BID) | ORAL | Status: AC

## 2014-11-17 MED ORDER — METRONIDAZOLE 0.75 % VA GEL: 1.0000 | Freq: Every day | VAGINAL | Status: DC

## 2014-11-17 NOTE — MAU Provider Note (Signed)
Chief Complaint: Vaginal Discharge and Pelvic Pain   First Provider Initiated Contact with Patient 11/17/14 2017     SUBJECTIVE HPI: Lauren Conway is a 23 y.o. G2P1011 who presents to maternity admissions reporting pelvic cramping pain and white vaginal discharge with odor only noticed during intercourse and menstruation.  Patient's last menstrual period was 11/06/2014 (exact date).  She denies vaginal bleeding, vaginal itching/burning, urinary symptoms, h/a, dizziness, n/v, or fever/chills.    Past Medical History  Diagnosis Date  . History of gonorrhea     12/2008; 09/2010;   . History of chlamydia     12/2008; 03/09/2009  . Asthma    Past Surgical History  Procedure Laterality Date  . No past surgeries     History   Social History  . Marital Status: Single    Spouse Name: N/A    Number of Children: N/A  . Years of Education: N/A   Occupational History  . Not on file.   Social History Main Topics  . Smoking status: Former Smoker -- 0.25 packs/day  . Smokeless tobacco: Never Used  . Alcohol Use: No     Comment: Rare  . Drug Use: No  . Sexual Activity:    Partners: Male    Birth Control/ Protection: None     Comment: Pt wants to use Depo Provera for birth control.   Other Topics Concern  . Not on file   Social History Narrative   No current facility-administered medications on file prior to encounter.   Current Outpatient Prescriptions on File Prior to Encounter  Medication Sig Dispense Refill  . acetaminophen (TYLENOL) 325 MG tablet Take 650 mg by mouth every 6 (six) hours as needed for headache.    . albuterol (PROVENTIL HFA;VENTOLIN HFA) 108 (90 BASE) MCG/ACT inhaler Inhale 1-2 puffs into the lungs every 4 (four) hours as needed for wheezing or shortness of breath. 1 Inhaler 2  . ketorolac (TORADOL) 10 MG tablet Take 10 mg by mouth every 6 (six) hours as needed for moderate pain or severe pain.    . mometasone-formoterol (DULERA) 100-5 MCG/ACT AERO Inhale 1  puff into the lungs 2 (two) times daily.    . predniSONE (DELTASONE) 20 MG tablet Take 2 tablets (40 mg total) by mouth daily. 10 tablet 0  . Spacer/Aero-Holding Chambers (AEROCHAMBER PLUS) inhaler Use as instructed 1 each 2   Allergies  Allergen Reactions  . Latex Itching and Swelling    ROS: Pertinent items in HPI  OBJECTIVE Blood pressure 131/74, pulse 82, temperature 98.2 F (36.8 C), temperature source Oral, resp. rate 18, last menstrual period 11/06/2014. GENERAL: Well-developed, well-nourished female in no acute distress.  HEENT: Normocephalic HEART: normal rate RESP: normal effort ABDOMEN: Soft, non-tender EXTREMITIES: Nontender, no edema NEURO: Alert and oriented Pelvic exam: Cervix pink, visually closed, without lesion, small amount thin white malodorous discharge, vaginal walls and external genitalia normal Bimanual exam: Cervix 0/long/high, firm, anterior, neg CMT, uterus nontender, nonenlarged, adnexa without tenderness, enlargement, or mass  LAB RESULTS Results for orders placed or performed during the hospital encounter of 11/17/14 (from the past 24 hour(s))  Urinalysis, Routine w reflex microscopic     Status: Abnormal   Collection Time: 11/17/14  6:50 PM  Result Value Ref Range   Color, Urine YELLOW YELLOW   APPearance CLEAR CLEAR   Specific Gravity, Urine >1.030 (H) 1.005 - 1.030   pH 5.0 5.0 - 8.0   Glucose, UA NEGATIVE NEGATIVE mg/dL   Hgb urine dipstick NEGATIVE NEGATIVE  Bilirubin Urine NEGATIVE NEGATIVE   Ketones, ur NEGATIVE NEGATIVE mg/dL   Protein, ur NEGATIVE NEGATIVE mg/dL   Urobilinogen, UA 0.2 0.0 - 1.0 mg/dL   Nitrite NEGATIVE NEGATIVE   Leukocytes, UA NEGATIVE NEGATIVE  Pregnancy, urine POC     Status: None   Collection Time: 11/17/14  7:10 PM  Result Value Ref Range   Preg Test, Ur NEGATIVE NEGATIVE  CBC     Status: None   Collection Time: 11/17/14  8:05 PM  Result Value Ref Range   WBC 8.6 4.0 - 10.5 K/uL   RBC 4.15 3.87 - 5.11  MIL/uL   Hemoglobin 13.1 12.0 - 15.0 g/dL   HCT 95.638.2 21.336.0 - 08.646.0 %   MCV 92.0 78.0 - 100.0 fL   MCH 31.6 26.0 - 34.0 pg   MCHC 34.3 30.0 - 36.0 g/dL   RDW 57.812.4 46.911.5 - 62.915.5 %   Platelets 359 150 - 400 K/uL  HIV antibody     Status: None   Collection Time: 11/17/14  8:05 PM  Result Value Ref Range   HIV 1&2 Ab, 4th Generation NONREACTIVE NONREACTIVE  Wet prep, genital     Status: Abnormal   Collection Time: 11/17/14  8:30 PM  Result Value Ref Range   Yeast Wet Prep HPF POC NONE SEEN NONE SEEN   Trich, Wet Prep NONE SEEN NONE SEEN   Clue Cells Wet Prep HPF POC MODERATE (A) NONE SEEN   WBC, Wet Prep HPF POC MODERATE (A) NONE SEEN    ASSESSMENT 1. BV (bacterial vaginosis)     [facility] and Flagyl per pt request F/U with Gyn provider Return to MAU as needed for emergencies    Medication List    TAKE these medications        acetaminophen 325 MG tablet  Commonly known as:  TYLENOL  Take 650 mg by mouth every 6 (six) hours as needed for headache.     AEROCHAMBER PLUS inhaler  Use as instructed     albuterol 108 (90 BASE) MCG/ACT inhaler  Commonly known as:  PROVENTIL HFA;VENTOLIN HFA  Inhale 1-2 puffs into the lungs every 4 (four) hours as needed for wheezing or shortness of breath.     ketorolac 10 MG tablet  Commonly known as:  TORADOL  Take 10 mg by mouth every 6 (six) hours as needed for moderate pain or severe pain.     metroNIDAZOLE 0.75 % vaginal gel  Commonly known as:  METROGEL  Place 1 Applicatorful vaginally at bedtime. Apply one applicatorful to vagina at bedtime for 5 days     metroNIDAZOLE 500 MG tablet  Commonly known as:  FLAGYL  Take 1 tablet (500 mg total) by mouth 2 (two) times daily.     mometasone-formoterol 100-5 MCG/ACT Aero  Commonly known as:  DULERA  Inhale 1 puff into the lungs 2 (two) times daily.     predniSONE 20 MG tablet  Commonly known as:  DELTASONE  Take 2 tablets (40 mg total) by mouth  daily.       Follow-up Information    [facility]   Why:  As needed for emergencies   Contact information:   [address] [id number] mc [place] [phone]      Sharen CounterLisa Leftwich-Kirby Certified Nurse-Midwife 11/18/2014  8:51 AM

## 2014-11-17 NOTE — MAU Note (Signed)
Been having some pelvic pain- started about a wk ago, slight odor- only noted during sex.

## 2014-11-17 NOTE — Discharge Instructions (Signed)
Bacterial Vaginosis Bacterial vaginosis is a vaginal infection that occurs when the normal balance of bacteria in the vagina is disrupted. It results from an overgrowth of certain bacteria. This is the most common vaginal infection in women of childbearing age. Treatment is important to prevent complications, especially in pregnant women, as it can cause a premature delivery. CAUSES  Bacterial vaginosis is caused by an increase in harmful bacteria that are normally present in smaller amounts in the vagina. Several different kinds of bacteria can cause bacterial vaginosis. However, the reason that the condition develops is not fully understood. RISK FACTORS Certain activities or behaviors can put you at an increased risk of developing bacterial vaginosis, including:  Having a new sex partner or multiple sex partners.  Douching.  Using an intrauterine device (IUD) for contraception. Women do not get bacterial vaginosis from toilet seats, bedding, swimming pools, or contact with objects around them. SIGNS AND SYMPTOMS  Some women with bacterial vaginosis have no signs or symptoms. Common symptoms include:  Grey vaginal discharge.  A fishlike odor with discharge, especially after sexual intercourse.  Itching or burning of the vagina and vulva.  Burning or pain with urination. DIAGNOSIS  Your health care provider will take a medical history and examine the vagina for signs of bacterial vaginosis. A sample of vaginal fluid may be taken. Your health care provider will look at this sample under a microscope to check for bacteria and abnormal cells. A vaginal pH test may also be done.  TREATMENT  Bacterial vaginosis may be treated with antibiotic medicines. These may be given in the form of a pill or a vaginal cream. A second round of antibiotics may be prescribed if the condition comes back after treatment.  HOME CARE INSTRUCTIONS   Only take over-the-counter or prescription medicines as  directed by your health care provider.  If antibiotic medicine was prescribed, take it as directed. Make sure you finish it even if you start to feel better.  Do not have sex until treatment is completed.  Tell all sexual partners that you have a vaginal infection. They should see their health care provider and be treated if they have problems, such as a mild rash or itching.  Practice safe sex by using condoms and only having one sex partner. SEEK MEDICAL CARE IF:   Your symptoms are not improving after 3 days of treatment.  You have increased discharge or pain.  You have a fever. MAKE SURE YOU:   Understand these instructions.  Will watch your condition.  Will get help right away if you are not doing well or get worse. FOR MORE INFORMATION  Centers for Disease Control and Prevention, Division of STD Prevention: www.cdc.gov/std American Sexual Health Association (ASHA): www.ashastd.org  Document Released: 12/02/2005 Document Revised: 09/22/2013 Document Reviewed: 07/14/2013 ExitCare Patient Information 2015 [employer]. This information is not intended to replace advice given to you by your health care provider. Make sure you discuss any questions you have with your health care provider.  

## 2014-11-17 NOTE — MAU Note (Signed)
Report taken from Lamarheryl, [place]. Will monitor

## 2014-11-18 LAB — GC/CHLAMYDIA PROBE AMP
CT Probe RNA: POSITIVE — AB
GC Probe RNA: NEGATIVE

## 2014-11-18 LAB — HIV ANTIBODY (ROUTINE TESTING W REFLEX): HIV 1&2 Ab, 4th Generation: NONREACTIVE

## 2014-11-20 ENCOUNTER — Encounter (HOSPITAL_COMMUNITY): Payer: Self-pay | Admitting: Advanced Practice Midwife

## 2014-11-20 ENCOUNTER — Other Ambulatory Visit (HOSPITAL_COMMUNITY): Payer: Self-pay | Admitting: Advanced Practice Midwife

## 2014-11-20 MED ORDER — AZITHROMYCIN 500 MG PO TABS: 1000.0000 mg | ORAL_TABLET | Freq: Once | ORAL | Status: DC

## 2014-11-20 NOTE — MAU Provider Note (Deleted)
See Treatment note

## 2014-11-20 NOTE — Telephone Encounter (Signed)
This encounter was created in error - please disregard.

## 2014-11-20 NOTE — Treatment Plan (Signed)
Attempted to call patient re: + chlamydia result  I put in an order to her [facility] for Zithromax.  Got voicemail so left message for her to call here.  Aviva SignsMarie L Williams, CNM

## 2014-11-20 NOTE — MAU Provider Note (Deleted)
See Treatment note 

## 2014-12-13 ENCOUNTER — Encounter: Payer: Self-pay | Admitting: Obstetrics & Gynecology

## 2014-12-16 DIAGNOSIS — B009 Herpesviral infection, unspecified: Secondary | ICD-10-CM

## 2014-12-16 HISTORY — DX: Herpesviral infection, unspecified: B00.9

## 2014-12-16 NOTE — L&D Delivery Note (Signed)
Operative Delivery Note This is a 24 year old G 3 P12 who was admitted for elective IOL at term. She progressed with cytotec, pitocin and epidural to the second stage of labor.  She pushed for 10 min.  At 1:03 PM she delivered a viable infant female, cephalic.  A nuchal cord  Identified was not.    Apgar scores were 7 and 9. a viable female was delivered via .  Presentation: vertex; Position: Right, Delivery of the head:  Second maneuver:McRoberts+suprapubic pressure,  15 sec Third maneuver: Rubin's 2,  15 sec Fourth maneuver:Menticoglou ,  15 sec Fifth maneuver: ,   Sixth maneuver: ,    APGAR: 7, 9; weight 7 #11.6 oz .   Placenta status: spontaneous, intact, 3 vessel cord.    Anesthesia: Epidural  Episiotomy:  none Lacerations:  2nd deg. Vaginal Suture Repair: 3.0 vicryl Est. Blood Loss (mL):  22mL  The placenta delivered spontaneously, shultz, with a 3 vessel cord.  Inspection revealed 2nd degree and vaginal. The uterus was firm bleeding stable.  The repair was done under epidural.   EBL was 22 ml.    Placenta and umbilical artery blood gas were not sent.  There were no complications during the procedure.  Mom and baby skin to skin following delivery. Left in stable condition.    Mom to postpartum.  Baby to Couplet care / Skin to Skin.  Rachelle A Denney 08/09/2015, 2:23 PM

## 2014-12-18 ENCOUNTER — Inpatient Hospital Stay (HOSPITAL_COMMUNITY): Payer: Medicaid Other

## 2014-12-18 ENCOUNTER — Inpatient Hospital Stay (HOSPITAL_COMMUNITY)
Admission: AD | Admit: 2014-12-18 | Discharge: 2014-12-18 | Disposition: A | Discharge status: Home / Self Care | Payer: Medicaid Other | Source: Ambulatory Visit | Attending: Obstetrics and Gynecology | Admitting: Obstetrics and Gynecology

## 2014-12-18 ENCOUNTER — Encounter (HOSPITAL_COMMUNITY): Payer: Self-pay

## 2014-12-18 DIAGNOSIS — Z87891 Personal history of nicotine dependence: Secondary | ICD-10-CM | POA: Diagnosis not present

## 2014-12-18 DIAGNOSIS — O26851 Spotting complicating pregnancy, first trimester: Secondary | ICD-10-CM

## 2014-12-18 DIAGNOSIS — O9989 Other specified diseases and conditions complicating pregnancy, childbirth and the puerperium: Secondary | ICD-10-CM

## 2014-12-18 DIAGNOSIS — O26899 Other specified pregnancy related conditions, unspecified trimester: Secondary | ICD-10-CM

## 2014-12-18 DIAGNOSIS — O208 Other hemorrhage in early pregnancy: Secondary | ICD-10-CM

## 2014-12-18 DIAGNOSIS — R109 Unspecified abdominal pain: Secondary | ICD-10-CM | POA: Diagnosis present

## 2014-12-18 DIAGNOSIS — Z3A01 Less than 8 weeks gestation of pregnancy: Secondary | ICD-10-CM | POA: Insufficient documentation

## 2014-12-18 DIAGNOSIS — O418X10 Other specified disorders of amniotic fluid and membranes, first trimester, not applicable or unspecified: Secondary | ICD-10-CM

## 2014-12-18 DIAGNOSIS — O468X1 Other antepartum hemorrhage, first trimester: Secondary | ICD-10-CM

## 2014-12-18 LAB — CBC
HCT: 35.7 % — ABNORMAL LOW (ref 36.0–46.0)
HEMOGLOBIN: 12.2 g/dL (ref 12.0–15.0)
MCH: 30.8 pg (ref 26.0–34.0)
MCHC: 34.2 g/dL (ref 30.0–36.0)
MCV: 90.2 fL (ref 78.0–100.0)
Platelets: 337 10*3/uL (ref 150–400)
RBC: 3.96 MIL/uL (ref 3.87–5.11)
RDW: 12.1 % (ref 11.5–15.5)
WBC: 9.7 10*3/uL (ref 4.0–10.5)

## 2014-12-18 LAB — URINALYSIS, ROUTINE W REFLEX MICROSCOPIC
Bilirubin Urine: NEGATIVE
GLUCOSE, UA: NEGATIVE mg/dL
Ketones, ur: 15 mg/dL — AB
Leukocytes, UA: NEGATIVE
NITRITE: NEGATIVE
PROTEIN: NEGATIVE mg/dL
Specific Gravity, Urine: >1.03 — ABNORMAL HIGH (ref 1.005–1.030)
Urobilinogen, UA: 0.2 mg/dL (ref 0.0–1.0)
pH: 5.5 (ref 5.0–8.0)

## 2014-12-18 LAB — HIV ANTIBODY (ROUTINE TESTING W REFLEX): HIV 1&2 Ab, 4th Generation: NONREACTIVE

## 2014-12-18 LAB — HCG, QUANTITATIVE, PREGNANCY: hCG, Beta Chain, Quant, S: 55099 m[IU]/mL — ABNORMAL HIGH (ref <5)

## 2014-12-18 LAB — URINE MICROSCOPIC-ADD ON

## 2014-12-18 LAB — WET PREP, GENITAL
Clue Cells Wet Prep HPF POC: NONE SEEN
TRICH WET PREP: NONE SEEN
YEAST WET PREP: NONE SEEN

## 2014-12-18 LAB — POCT PREGNANCY, URINE: Preg Test, Ur: POSITIVE — AB

## 2014-12-18 NOTE — MAU Provider Note (Signed)
Chief Complaint: Abdominal Pain   First Provider Initiated Contact with Patient 12/18/14 0310     SUBJECTIVE HPI: Lauren Conway is a 24 y.o. G3P1011 at [redacted]w[redacted]d by LMP who presents with bil lower abd pain x 1 month, worse over last 2 weeks after working at a job requiring standing for long periods of time. Pain is 2/10, intermittent dull ache, intermittently radiating to the back. Pt also reports brown vaginal discharge over last 2 days. Pt reports discharge is odorless and just spotting a panty liner. Pt denies vaginal bleeding, denies nausea, reports 2 episodes of vomiting in 2 weeks. Pt denies dysuria, hematuria, constipation, diarrhea.  Pt accompanied by 42 year old daughter. Pt reports both she and FOB where both diagnosed and treated for chlamydia, denies new sexual partners. Reports last sexual intercourse 3 weeks ago. Denies concern for STDs.  Pt denies established OB, has not had prenatal care.  Past Medical History  Diagnosis Date  . History of gonorrhea     12/2008; 09/2010;   . History of chlamydia     12/2008; 03/09/2009  . Asthma    OB History  Gravida Para Term Preterm AB SAB TAB Ectopic Multiple Living  # Outcome Date GA Lbr Len/2nd Weight Sex Delivery Anes PTL Lv  3 Current           2 Term 03/19/13 [redacted]w[redacted]d 14:40 / 00:34 2.88 kg (6 lb 5.6 oz) F Vag-Spont EPI  Y     Comments: umbilical hernia  1 SAB  [redacted]w[redacted]d            Comments: no complication     Past Surgical History  Procedure Laterality Date  . No past surgeries     History   Social History  . Marital Status: Single    Spouse Name: N/A    Number of Children: N/A  . Years of Education: N/A   Occupational History  . Not on file.   Social History Main Topics  . Smoking status: Former Smoker -- 0.25 packs/day  . Smokeless tobacco: Never Used  . Alcohol Use: No     Comment: Rare  . Drug Use: No  . Sexual Activity:    Partners: Male    Birth Control/ Protection: None     Comment: Pt  wants to use Depo Provera for birth control.   Other Topics Concern  . Not on file   Social History Narrative   No current facility-administered medications on file prior to encounter.   Current Outpatient Prescriptions on File Prior to Encounter  Medication Sig Dispense Refill  . acetaminophen (TYLENOL) 325 MG tablet Take 650 mg by mouth every 6 (six) hours as needed for headache.    . albuterol (PROVENTIL HFA;VENTOLIN HFA) 108 (90 BASE) MCG/ACT inhaler Inhale 1-2 puffs into the lungs every 4 (four) hours as needed for wheezing or shortness of breath. 1 Inhaler 2  . azithromycin (ZITHROMAX) 500 MG tablet Take 2 tablets (1,000 mg total) by mouth once. 2 tablet 0  . ketorolac (TORADOL) 10 MG tablet Take 10 mg by mouth every 6 (six) hours as needed for moderate pain or severe pain.    . metroNIDAZOLE (METROGEL) 0.75 % vaginal gel Place 1 Applicatorful vaginally at bedtime. Apply one applicatorful to vagina at bedtime for 5 days 70 g 1  . mometasone-formoterol (DULERA) 100-5 MCG/ACT AERO Inhale 1 puff into the lungs 2 (two) times daily.    Marland Kitchen  predniSONE (DELTASONE) 20 MG tablet Take 2 tablets (40 mg total) by mouth daily. 10 tablet 0  . Spacer/Aero-Holding Chambers (AEROCHAMBER PLUS) inhaler Use as instructed 1 each 2   Allergies  Allergen Reactions  . Latex Itching and Swelling    ROS: Pertinent items in HPI  OBJECTIVE Blood pressure 122/61, pulse 88, temperature 98.9 F (37.2 C), temperature source Oral, resp. rate 16, height 5\' 4"  (1.626 m), weight 84.596 kg (186 lb 8 oz), last menstrual period 11/06/2014, SpO2 98 %. GENERAL: Well-developed, well-nourished female in no acute distress.  HEENT: Normocephalic HEART: normal rate, s1s2 auscultated RESP: normal effort, respirations even and unlabored, lungs clear bil ABDOMEN: Soft, non-tender, + bowel sounds in all quadrants, no organomegaly or masses, - Murphys and McBurneys. EXTREMITIES: Nontender, no edema NEURO: Alert and  oriented SPECULUM EXAM: NEFG, physiologic discharge, no blood noted, cervix clean, scant white clumpy vaginal discharge BIMANUAL: cervix fingertip opening; uterus normal size, no adnexal tenderness or masses, no CMT  LAB RESULTS Results for orders placed or performed during the hospital encounter of 12/18/14 (from the past 24 hour(s))  Urinalysis, Routine w reflex microscopic     Status: Abnormal   Collection Time: 12/18/14  2:17 AM  Result Value Ref Range   Color, Urine YELLOW YELLOW   APPearance CLEAR CLEAR   Specific Gravity, Urine >1.030 (H) 1.005 - 1.030   pH 5.5 5.0 - 8.0   Glucose, UA NEGATIVE NEGATIVE mg/dL   Hgb urine dipstick TRACE (A) NEGATIVE   Bilirubin Urine NEGATIVE NEGATIVE   Ketones, ur 15 (A) NEGATIVE mg/dL   Protein, ur NEGATIVE NEGATIVE mg/dL   Urobilinogen, UA 0.2 0.0 - 1.0 mg/dL   Nitrite NEGATIVE NEGATIVE   Leukocytes, UA NEGATIVE NEGATIVE  Urine microscopic-add on     Status: None   Collection Time: 12/18/14  2:17 AM  Result Value Ref Range   Squamous Epithelial / LPF RARE RARE   WBC, UA 0-2 <3 WBC/hpf   RBC / HPF 0-2 <3 RBC/hpf   Bacteria, UA RARE RARE   Urine-Other MUCOUS PRESENT   Pregnancy, urine POC     Status: Abnormal   Collection Time: 12/18/14  2:23 AM  Result Value Ref Range   Preg Test, Ur POSITIVE (A) NEGATIVE  Wet prep, genital     Status: Abnormal   Collection Time: 12/18/14  3:20 AM  Result Value Ref Range   Yeast Wet Prep HPF POC NONE SEEN NONE SEEN   Trich, Wet Prep NONE SEEN NONE SEEN   Clue Cells Wet Prep HPF POC NONE SEEN NONE SEEN   WBC, Wet Prep HPF POC RARE (A) NONE SEEN  hCG, quantitative, pregnancy     Status: Abnormal   Collection Time: 12/18/14  3:30 AM  Result Value Ref Range   hCG, Beta Chain, Quant, S 55099 (H) <5 mIU/mL  CBC     Status: Abnormal   Collection Time: 12/18/14  3:30 AM  Result Value Ref Range   WBC 9.7 4.0 - 10.5 K/uL   RBC 3.96 3.87 - 5.11 MIL/uL   Hemoglobin 12.2 12.0 - 15.0 g/dL   HCT 16.1 (L)  09.6 - 46.0 %   MCV 90.2 78.0 - 100.0 fL   MCH 30.8 26.0 - 34.0 pg   MCHC 34.2 30.0 - 36.0 g/dL   RDW 04.5 40.9 - 81.1 %   Platelets 337 150 - 400 K/uL    IMAGING US Ob Comp Less 14 Wks  12/18/2014   CLINICAL DATA:  Spotting. Estimated  gestational age by LMP is 6 weeks 0 days. Quantitative beta HCG is 55,099.  EXAM: OBSTETRIC <14 WK [place] AND TRANSVAGINAL OB US  TECHNIQUE: Both transabdominal and transvaginal ultrasound examinations were performed for complete evaluation of the gestation as well as the maternal uterus, adnexal regions, and pelvic cul-de-sac. Transvaginal technique was performed to assess early pregnancy.  COMPARISON:  None.  FINDINGS: Intrauterine gestational sac: A single intrauterine gestational sac is identified.  Yolk sac:  Yolk sac is present.  Embryo:  Fetal pole is identified.  Cardiac Activity: Fetal cardiac activity is visualized.  Heart Rate:  107 bpm  CRL:   2.3  mm   5 w 6 d                  [place] EDC: 08/14/2015  Maternal uterus/adnexae: Uterus is anteverted. No myometrial mass lesions. Suggestion of small subchorionic hemorrhage. Both ovaries are visualized and appear normal. No abnormal adnexal masses. Corpus luteum cyst on the right ovary. Minimal free fluid in the pelvis.  IMPRESSION: Single intrauterine pregnancy. Estimated gestational age by crown-rump length is 5 weeks 6 days. Mental subchorionic hemorrhage. Minimal free fluid in the pelvis.   Electronically Signed   By: Burman Nieves M.D.   On: 12/18/2014 05:50   US Ob Transvaginal  12/18/2014   CLINICAL DATA:  Spotting. Estimated gestational age by LMP is 6 weeks 0 days. Quantitative beta HCG is 55,099.  EXAM: OBSTETRIC <14 WK [place] AND TRANSVAGINAL OB US  TECHNIQUE: Both transabdominal and transvaginal ultrasound examinations were performed for complete evaluation of the gestation as well as the maternal uterus, adnexal regions, and pelvic cul-de-sac. Transvaginal technique was performed to assess early pregnancy.   COMPARISON:  None.  FINDINGS: Intrauterine gestational sac: A single intrauterine gestational sac is identified.  Yolk sac:  Yolk sac is present.  Embryo:  Fetal pole is identified.  Cardiac Activity: Fetal cardiac activity is visualized.  Heart Rate:  107 bpm  CRL:   2.3  mm   5 w 6 d                  [place] EDC: 08/14/2015  Maternal uterus/adnexae: Uterus is anteverted. No myometrial mass lesions. Suggestion of small subchorionic hemorrhage. Both ovaries are visualized and appear normal. No abnormal adnexal masses. Corpus luteum cyst on the right ovary. Minimal free fluid in the pelvis.  IMPRESSION: Single intrauterine pregnancy. Estimated gestational age by crown-rump length is 5 weeks 6 days. Mental subchorionic hemorrhage. Minimal free fluid in the pelvis.   Electronically Signed   By: Burman Nieves M.D.   On: 12/18/2014 05:50    MAU COURSE GC, Wet Prep, HIV and UA eval possible infection. [place] to determine IUP due to pelvic pain. [place] confirmed IUP [redacted]w[redacted]d with FHT of 107.   ASSESSMENT 1. Abdominal pain in pregnancy   2. Spotting affecting pregnancy in first trimester   Live SIUP  PLAN Discharge home Pt provided list of providers, educated on need to establish OB provider. OTC Tylenol PRN pain, OTC Prenatal Vitamin Daily. Increase PO fluid consumption   Follow-up Information    Please follow up.   Why:  Start prenatal care        Medication List    STOP taking these medications        AEROCHAMBER PLUS inhaler     azithromycin 500 MG tablet  Commonly known as:  ZITHROMAX     ketorolac 10 MG tablet  Commonly known as:  TORADOL  metroNIDAZOLE 0.75 % vaginal gel  Commonly known as:  METROGEL     mometasone-formoterol 100-5 MCG/ACT Aero  Commonly known as:  DULERA     predniSONE 20 MG tablet  Commonly known as:  DELTASONE      TAKE these medications        acetaminophen 325 MG tablet  Commonly known as:  TYLENOL  Take 650 mg by mouth every 6 (six) hours as needed for  headache.     albuterol 108 (90 BASE) MCG/ACT inhaler  Commonly known as:  PROVENTIL HFA;VENTOLIN HFA  Inhale 1-2 puffs into the lungs every 4 (four) hours as needed for wheezing or shortness of breath.     prenatal multivitamin Tabs tablet  Take 1 tablet by mouth daily at 12 noon.       Micker Samios, FNP-S  I was present for the exam and agree with above.  [place], CNM 12/18/2014  3:23 AM

## 2014-12-18 NOTE — Discharge Instructions (Signed)
You have a confirmed intrauterine pregnancy with an estimated gestation age of [redacted]w[redacted]d. You have a small subchorionic hematoma, see instructions below. You need to establish OB care and begin taking prenatal vitamins.  Subchorionic Hematoma A subchorionic hematoma is a gathering of blood between the outer wall of the placenta and the inner wall of the womb (uterus). The placenta is the organ that connects the fetus to the wall of the uterus. The placenta performs the feeding, breathing (oxygen to the fetus), and waste removal (excretory work) of the fetus.  Subchorionic hematoma is the most common abnormality found on a result from ultrasonography done during the first trimester or early second trimester of pregnancy. If there has been little or no vaginal bleeding, early small hematomas usually shrink on their own and do not affect your baby or pregnancy. The blood is gradually absorbed over 1-2 weeks. When bleeding starts later in pregnancy or the hematoma is larger or occurs in an older pregnant woman, the outcome may not be as good. Larger hematomas may get bigger, which increases the chances for miscarriage. Subchorionic hematoma also increases the risk of premature detachment of the placenta from the uterus, preterm (premature) labor, and stillbirth. HOME CARE INSTRUCTIONS  Stay on bed rest if your health care provider recommends this. Although bed rest will not prevent more bleeding or prevent a miscarriage, your health care provider may recommend bed rest until you are advised otherwise.  Avoid heavy lifting (more than 10 lb [4.5 kg]), exercise, sexual intercourse, or douching as directed by your health care provider.  Keep track of the number of pads you use each day and how soaked (saturated) they are. Write down this information.  Do not use tampons.  Keep all follow-up appointments as directed by your health care provider. Your health care provider may ask you to have follow-up blood tests or  ultrasound tests or both. SEEK IMMEDIATE MEDICAL CARE IF:  You have severe cramps in your stomach, back, abdomen, or pelvis.  You have a fever.  You pass large clots or tissue. Save any tissue for your health care provider to look at.  Your bleeding increases or you become lightheaded, feel weak, or have fainting episodes. Document Released: 03/19/2007 Document Revised: 04/18/2014 Document Reviewed: 07/01/2013 [facility] Patient Information 2015 [place], [place]. This information is not intended to replace advice given to you by your health care provider. Make sure you discuss any questions you have with your health care provider. Vaginal Bleeding During Pregnancy, First Trimester A small amount of bleeding (spotting) from the vagina is relatively common in early pregnancy. It usually stops on its own. Various things may cause bleeding or spotting in early pregnancy. Some bleeding may be related to the pregnancy, and some may not. In most cases, the bleeding is normal and is not a problem. However, bleeding can also be a sign of something serious. Be sure to tell your health care provider about any vaginal bleeding right away. Some possible causes of vaginal bleeding during the first trimester include:  Infection or inflammation of the cervix.  Growths (polyps) on the cervix.  Miscarriage or threatened miscarriage.  Pregnancy tissue has developed outside of the uterus and in a fallopian tube (tubal pregnancy).  Tiny cysts have developed in the uterus instead of pregnancy tissue (molar pregnancy). HOME CARE INSTRUCTIONS  Watch your condition for any changes. The following actions may help to lessen any discomfort you are feeling:  Follow your health care provider's instructions for limiting your activity.  If your health care provider orders bed rest, you may need to stay in bed and only get up to use the bathroom. However, your health care provider may allow you to continue light  activity.  If needed, make plans for someone to help with your regular activities and responsibilities while you are on bed rest.  Keep track of the number of pads you use each day, how often you change pads, and how soaked (saturated) they are. Write this down.  Do not use tampons. Do not douche.  Do not have sexual intercourse or orgasms until approved by your health care provider.  If you pass any tissue from your vagina, save the tissue so you can show it to your health care provider.  Only take over-the-counter or prescription medicines as directed by your health care provider.  Do not take aspirin because it can make you bleed.  Keep all follow-up appointments as directed by your health care provider. SEEK MEDICAL CARE IF:  You have any vaginal bleeding during any part of your pregnancy.  You have cramps or labor pains.  You have a fever, not controlled by medicine. SEEK IMMEDIATE MEDICAL CARE IF:   You have severe cramps in your back or belly (abdomen).  You pass large clots or tissue from your vagina.  Your bleeding increases.  You feel light-headed or weak, or you have fainting episodes.  You have chills.  You are leaking fluid or have a gush of fluid from your vagina.  You pass out while having a bowel movement. MAKE SURE YOU:  Understand these instructions.  Will watch your condition. Will get help right away if you are not doing well or get wor  Abdominal Pain During Pregnancy Belly (abdominal) pain is common during pregnancy. Most of the time, it is not a serious problem. Other times, it can be a sign that something is wrong with the pregnancy. Always tell your doctor if you have belly pain. HOME CARE Monitor your belly pain for any changes. The following actions may help you feel better:  Do not have sex (intercourse) or put anything in your vagina until you feel better.  Rest until your pain stops.  Drink clear fluids if you feel sick to your  stomach (nauseous). Do not eat solid food until you feel better.  Only take medicine as told by your doctor.  Keep all doctor visits as told. GET HELP RIGHT AWAY IF:   You are bleeding, leaking fluid, or pieces of tissue come out of your vagina.  You have more pain or cramping.  You keep throwing up (vomiting).  You have pain when you pee (urinate) or have blood in your pee.  You have a fever.  You do not feel your baby moving as much.  You feel very weak or feel like passing out.  You have trouble breathing, with or without belly pain.  You have a very bad headache and belly pain.  You have fluid leaking from your vagina and belly pain.  You keep having watery poop (diarrhea).  Your belly pain does not go away after resting, or the pain gets worse. MAKE SURE YOU:   Understand these instructions.  Will watch your condition.  Will get help right away if you are not doing well or get worse. Document Released: 11/20/2009 Document Revised: 08/04/2013 Document Reviewed: 07/01/2013 [facility] Patient Information 2015 [place], [place]. This information is not intended to replace advice given to you by your health care provider. Make  sure you discuss any questions you have with your health care provider.  se. Document Released: 09/11/2005 Document Revised: 12/07/2013 Document Reviewed: 08/09/2013 [facility] Patient Information 2015 [place], [place]. This information is not intended to replace advice given to you by your health care provider. Make sure you discuss any questions you have with your health care provider. First Trimester of Pregnancy The first trimester of pregnancy is from week 1 until the end of week 12 (months 1 through 3). A week after a sperm fertilizes an egg, the egg will implant on the wall of the uterus. This embryo will begin to develop into a baby. Genes from you and your partner are forming the baby. The female genes determine whether the baby is a boy or a girl.  At 6-8 weeks, the eyes and face are formed, and the heartbeat can be seen on ultrasound. At the end of 12 weeks, all the baby's organs are formed.  Now that you are pregnant, you will want to do everything you can to have a healthy baby. Two of the most important things are to get good prenatal care and to follow your health care provider's instructions. Prenatal care is all the medical care you receive before the baby's birth. This care will help prevent, find, and treat any problems during the pregnancy and childbirth. BODY CHANGES Your body goes through many changes during pregnancy. The changes vary from woman to woman.   You may gain or lose a couple of pounds at first.  You may feel sick to your stomach (nauseous) and throw up (vomit). If the vomiting is uncontrollable, call your health care provider.  You may tire easily.  You may develop headaches that can be relieved by medicines approved by your health care provider.  You may urinate more often. Painful urination may mean you have a bladder infection.  You may develop heartburn as a result of your pregnancy.  You may develop constipation because certain hormones are causing the muscles that push waste through your intestines to slow down.  You may develop hemorrhoids or swollen, bulging veins (varicose veins).  Your breasts may begin to grow larger and become tender. Your nipples may stick out more, and the tissue that surrounds them (areola) may become darker.  Your gums may bleed and may be sensitive to brushing and flossing.  Dark spots or blotches (chloasma, mask of pregnancy) may develop on your face. This will likely fade after the baby is born.  Your menstrual periods will stop.  You may have a loss of appetite.  You may develop cravings for certain kinds of food.  You may have changes in your emotions from day to day, such as being excited to be pregnant or being concerned that something may go wrong with the  pregnancy and baby.  You may have more vivid and strange dreams.  You may have changes in your hair. These can include thickening of your hair, rapid growth, and changes in texture. Some women also have hair loss during or after pregnancy, or hair that feels dry or thin. Your hair will most likely return to normal after your baby is born. WHAT TO EXPECT AT YOUR PRENATAL VISITS During a routine prenatal visit:  You will be weighed to make sure you and the baby are growing normally.  Your blood pressure will be taken.  Your abdomen will be measured to track your baby's growth.  The fetal heartbeat will be listened to starting around week 10 or 12  of your pregnancy.  Test results from any previous visits will be discussed. Your health care provider may ask you:  How you are feeling.  If you are feeling the baby move.  If you have had any abnormal symptoms, such as leaking fluid, bleeding, severe headaches, or abdominal cramping.  If you have any questions. Other tests that may be performed during your first trimester include:  Blood tests to find your blood type and to check for the presence of any previous infections. They will also be used to check for low iron levels (anemia) and Rh antibodies. Later in the pregnancy, blood tests for diabetes will be done along with other tests if problems develop.  Urine tests to check for infections, diabetes, or protein in the urine.  An ultrasound to confirm the proper growth and development of the baby.  An amniocentesis to check for possible genetic problems.  Fetal screens for spina bifida and Down syndrome.  You may need other tests to make sure you and the baby are doing well. HOME CARE INSTRUCTIONS  Medicines  Follow your health care provider's instructions regarding medicine use. Specific medicines may be either safe or unsafe to take during pregnancy.  Take your prenatal vitamins as directed.  If you develop constipation, try  taking a stool softener if your health care provider approves. Diet  Eat regular, well-balanced meals. Choose a variety of foods, such as meat or vegetable-based protein, fish, milk and low-fat dairy products, vegetables, fruits, and whole grain breads and cereals. Your health care provider will help you determine the amount of weight gain that is right for you.  Avoid raw meat and uncooked cheese. These carry germs that can cause birth defects in the baby.  Eating four or five small meals rather than three large meals a day may help relieve nausea and vomiting. If you start to feel nauseous, eating a few soda crackers can be helpful. Drinking liquids between meals instead of during meals also seems to help nausea and vomiting.  If you develop constipation, eat more high-fiber foods, such as fresh vegetables or fruit and whole grains. Drink enough fluids to keep your urine clear or pale yellow. Activity and Exercise  Exercise only as directed by your health care provider. Exercising will help you:  Control your weight.  Stay in shape.  Be prepared for labor and delivery.  Experiencing pain or cramping in the lower abdomen or low back is a good sign that you should stop exercising. Check with your health care provider before continuing normal exercises.  Try to avoid standing for long periods of time. Move your legs often if you must stand in one place for a long time.  Avoid heavy lifting.  Wear low-heeled shoes, and practice good posture.  You may continue to have sex unless your health care provider directs you otherwise. Relief of Pain or Discomfort  Wear a good support bra for breast tenderness.   Take warm sitz baths to soothe any pain or discomfort caused by hemorrhoids. Use hemorrhoid cream if your health care provider approves.   Rest with your legs elevated if you have leg cramps or low back pain.  If you develop varicose veins in your legs, wear support hose. Elevate  your feet for 15 minutes, 3-4 times a day. Limit salt in your diet. Prenatal Care  Schedule your prenatal visits by the twelfth week of pregnancy. They are usually scheduled monthly at first, then more often in the last 2 months  before delivery.  Write down your questions. Take them to your prenatal visits.  Keep all your prenatal visits as directed by your health care provider. Safety  Wear your seat belt at all times when driving.  Make a list of emergency phone numbers, including numbers for family, friends, the hospital, and police and fire departments. General Tips  Ask your health care provider for a referral to a local prenatal education class. Begin classes no later than at the beginning of month 6 of your pregnancy.  Ask for help if you have counseling or nutritional needs during pregnancy. Your health care provider can offer advice or refer you to specialists for help with various needs.  Do not use hot tubs, steam rooms, or saunas.  Do not douche or use tampons or scented sanitary pads.  Do not cross your legs for long periods of time.  Avoid cat litter boxes and soil used by cats. These carry germs that can cause birth defects in the baby and possibly loss of the fetus by miscarriage or stillbirth.  Avoid all smoking, herbs, alcohol, and medicines not prescribed by your health care provider. Chemicals in these affect the formation and growth of the baby.  Schedule a dentist appointment. At home, brush your teeth with a soft toothbrush and be gentle when you floss. SEEK MEDICAL CARE IF:   You have dizziness.  You have mild pelvic cramps, pelvic pressure, or nagging pain in the abdominal area.  You have persistent nausea, vomiting, or diarrhea.  You have a bad smelling vaginal discharge.  You have pain with urination.  You notice increased swelling in your face, hands, legs, or ankles. SEEK IMMEDIATE MEDICAL CARE IF:   You have a fever.  You are leaking fluid  from your vagina.  You have spotting or bleeding from your vagina.  You have severe abdominal cramping or pain.  You have rapid weight gain or loss.  You vomit blood or material that looks like coffee grounds.  You are exposed to [place] measles and have never had them.  You are exposed to fifth disease or chickenpox.  You develop a severe headache.  You have shortness of breath.  You have any kind of trauma, such as from a fall or a car accident. Document Released: 11/26/2001 Document Revised: 04/18/2014 Document Reviewed: 10/12/2013 [facility] Patient Information 2015 [place], [place]. This information is not intended to replace advice given to you by your health care provider. Make sure you discuss any questions you have with your health care provider.

## 2014-12-18 NOTE — MAU Note (Signed)
Lower abd pain for a week.  Spotting started 2 days - brown.  Positive home pregnancy test on Dec 18 th

## 2014-12-21 LAB — GC/CHLAMYDIA PROBE AMP
CT Probe RNA: NEGATIVE
GC Probe RNA: NEGATIVE

## 2015-01-02 ENCOUNTER — Inpatient Hospital Stay (HOSPITAL_COMMUNITY)
Admission: AD | Admit: 2015-01-02 | Discharge: 2015-01-02 | Disposition: A | Discharge status: Home / Self Care | Payer: Medicaid Other | Source: Ambulatory Visit | Attending: Family Medicine | Admitting: Family Medicine

## 2015-01-02 ENCOUNTER — Encounter (HOSPITAL_COMMUNITY): Payer: Self-pay | Admitting: *Deleted

## 2015-01-02 DIAGNOSIS — N898 Other specified noninflammatory disorders of vagina: Secondary | ICD-10-CM | POA: Diagnosis not present

## 2015-01-02 DIAGNOSIS — Z87891 Personal history of nicotine dependence: Secondary | ICD-10-CM | POA: Diagnosis not present

## 2015-01-02 DIAGNOSIS — Z202 Contact with and (suspected) exposure to infections with a predominantly sexual mode of transmission: Secondary | ICD-10-CM | POA: Diagnosis not present

## 2015-01-02 LAB — URINALYSIS, ROUTINE W REFLEX MICROSCOPIC
BILIRUBIN URINE: NEGATIVE
GLUCOSE, UA: NEGATIVE mg/dL
Hgb urine dipstick: NEGATIVE
Ketones, ur: NEGATIVE mg/dL
LEUKOCYTES UA: NEGATIVE
Nitrite: NEGATIVE
PH: 6 (ref 5.0–8.0)
PROTEIN: NEGATIVE mg/dL
Specific Gravity, Urine: 1.025 (ref 1.005–1.030)
Urobilinogen, UA: 0.2 mg/dL (ref 0.0–1.0)

## 2015-01-02 LAB — WET PREP, GENITAL
CLUE CELLS WET PREP: NONE SEEN
Trich, Wet Prep: NONE SEEN
Yeast Wet Prep HPF POC: NONE SEEN

## 2015-01-02 MED ORDER — CEFTRIAXONE SODIUM 250 MG IJ SOLR
250.0000 mg | Freq: Once | INTRAMUSCULAR | Status: AC
Start: 2015-01-02 — End: 2015-01-02
  Administered 2015-01-02: 250 mg via INTRAMUSCULAR
  Filled 2015-01-02: qty 250

## 2015-01-02 MED ORDER — AZITHROMYCIN 250 MG PO TABS
1000.0000 mg | ORAL_TABLET | Freq: Once | ORAL | Status: AC
  Administered 2015-01-02: 1000 mg via ORAL
  Filled 2015-01-02: qty 4

## 2015-01-02 NOTE — Discharge Instructions (Signed)
Chlamydia Chlamydia is an infection. It is spread through sexual contact. Chlamydia can be in different areas of the body. These areas include the cervix, urethra, throat, or rectum. You may not know you have chlamydia because many people never develop the symptoms. Chlamydia is not difficult to treat once you know you have it. However, if it is left untreated, chlamydia can lead to more serious health problems.  CAUSES  Chlamydia is caused by bacteria. It is a sexually transmitted disease. It is passed from an infected partner during intimate contact. This contact could be with the genitals, mouth, or rectal area. Chlamydia can also be passed from mothers to babies during birth. SIGNS AND SYMPTOMS  There may not be any symptoms. This is often the case early in the infection. If symptoms develop, they may include:  Mild pain and discomfort when urinating.  Redness, soreness, and swelling (inflammation) of the rectum.  Vaginal discharge.  Painful intercourse.  Abdominal pain.  Bleeding between menstrual periods. DIAGNOSIS  To diagnose this infection, your health care provider will do a pelvic exam. Cultures will be taken of the vagina, cervix, urine, and possibly the rectum to verify the diagnosis.  TREATMENT You will be given antibiotic medicines. If you are pregnant, certain types of antibiotics will need to be avoided. Any sexual partners should also be treated, even if they do not show symptoms.  HOME CARE INSTRUCTIONS   Take your antibiotic medicine as directed by your health care provider. Finish the antibiotic even if you start to feel better.  Take medicines only as directed by your health care provider.  Inform any sexual partners about the infection. They should also be treated.  Do not have sexual contact until your health care provider tells you it is okay.  Get plenty of rest.  Eat a well-balanced diet.  Drink enough fluids to keep your urine clear or pale  yellow.  Keep all follow-up visits as directed by your health care provider. SEEK MEDICAL CARE IF:  You have painful urination.  You have abdominal pain.  You have vaginal discharge.  You have painful sexual intercourse.  You have bleeding between periods and after sex.  You have a fever. SEEK IMMEDIATE MEDICAL CARE IF:   You experience nausea or vomiting.  You experience excessive sweating (diaphoresis).  You have difficulty swallowing. MAKE SURE YOU:   Understand these instructions.  Will watch your condition.  Will get help right away if you are not doing well or get worse. Document Released: 09/11/2005 Document Revised: 04/18/2014 Document Reviewed: 08/09/2013 [employer] Patient Information 2015 [employer]. This information is not intended to replace advice given to you by your health care provider. Make sure you discuss any questions you have with your health care provider.   Gonorrhea Gonorrhea is an infection that can cause serious problems. If left untreated, the infection may:   Damage the female or female organs.   Cause women to be unable to have children (sterility).   Harm a fetus if the infected woman is pregnant.  It is important to get treatment for gonorrhea as soon as possible. It is also necessary that all your sexual partners be tested for the infection.  CAUSES  Gonorrhea is caused by bacteria called Neisseria gonorrhoeae. The infection is spread from person to person, usually by sexual contact (such as by anal, vaginal, or oral means). A newborn can contract the infection from his or her mother during birth.  SYMPTOMS  Some people with gonorrhea do   not have symptoms. Symptoms may be different in females and males.  Females The most common symptoms are:   Pain in the lower abdomen.   Fever with or without chills.  Other symptoms include:   Abnormal vaginal discharge.   Painful intercourse.   Burning or itching of the vagina or  lips of the vagina.   Abnormal vaginal bleeding.   Pain when urinating.   Long-lasting (chronic) pain in the lower abdomen, especially during menstruation or intercourse.   Inability to become pregnant.   Going into premature labor.   Irritation, pain, bleeding, or discharge from the rectum. This may occur if the infection was spread by anal sex.   Sore throat or swollen lymph nodes in the neck. This may occur if the infection was spread by oral sex.  Males The most common symptoms are:   Discharge from the penis.   Pain or burning during urination.   Pain or swelling in the testicles. Other symptoms may include:   Irritation, pain, bleeding, or discharge from the rectum. This may occur if the infection was spread by anal sex.   Sore throat, fever, or swollen lymph nodes in the neck. This may occur if the infection was spread by oral sex.  DIAGNOSIS  A diagnosis is made after a physical exam is done and a sample of discharge is examined under a microscope for the presence of the bacteria. The discharge may be taken from the urethra, cervix, throat, or rectum.  TREATMENT  Gonorrhea is treated with antibiotic medicines. It is important for treatment to begin as soon as possible. Early treatment may prevent some problems from developing.  HOME CARE INSTRUCTIONS   Take medicines only as directed by your health care provider.   Take your antibiotic medicine as directed by your health care provider. Finish the antibiotic even if you start to feel better. Incomplete treatment will put you at risk for continued infection.   Do not have sex until treatment is complete or as directed by your health care provider.   Keep all follow-up visits as directed by your health care provider.   Not all test results are available during your visit. If your test results are not back during the visit, make an appointment with your health care provider to find out the results. Do  not assume everything is normal if you have not heard from your health care provider or the medical facility. It is your responsibility to get your test results.  If you test positive for gonorrhea, inform your recent sexual partners. They need to be checked for gonorrhea even if they do not have symptoms. They may need treatment, even if they test negative for gonorrhea.  SEEK MEDICAL CARE IF:   You develop any bad reaction to the medicine you were prescribed. This may include:   A rash.   Nausea.   Vomiting.   Diarrhea.   Your symptoms do not improve after a few days of taking antibiotics.   Your symptoms get worse.   You develop increased pain, such as in the testicles (for males) or in the abdomen (for females).  You have a fever. MAKE SURE YOU:   Understand these instructions.  Will watch your condition.  Will get help right away if you are not doing well or get worse. Document Released: 11/29/2000 Document Revised: 04/18/2014 Document Reviewed: 06/09/2013 [facility] Patient Information 2015 [place]. This information is not intended to replace advice given to you by your health care  provider. Make sure you discuss any questions you have with your health care provider. ° °

## 2015-01-02 NOTE — MAU Note (Signed)
Pt states she was here 3 weeks ago with vaginal discharge but that resolved, had sex yesterday with a partner that had given her chlamydia in December and she thinks she may have been infected again. Wants treatment.

## 2015-01-02 NOTE — MAU Provider Note (Signed)
History     CSN: [id number]  Arrival date and time: 01/02/15 2106   First Provider Initiated Contact with Patient 01/02/15 2206      No chief complaint on file.  HPI  Lauren FuellingJatavia Conway is a 24 y.o. G3P1011 at 2668w0d who presents today because [facility]. She wants to know if she can have her appointment here today and come to the clinic. She is also concerned because she has had intercourse with the partner that gave her chlamydia in the past. She would like testing and treatment for that today. She states that [facility] if [facility].   She had an US here on 12/18/14 that showed viable IUP.   Past Medical History  Diagnosis Date  . History of gonorrhea     12/2008; 09/2010;   . History of chlamydia     12/2008; 03/09/2009  . Asthma     Past Surgical History  Procedure Laterality Date  . No past surgeries      Family History  Problem Relation Age of Onset  . Other Neg Hx   . Hypertension Mother   . Asthma Maternal Grandmother     History  Substance Use Topics  . Smoking status: Former Smoker -- 0.25 packs/day  . Smokeless tobacco: Never Used  . Alcohol Use: No     Comment: Rare    Allergies:  Allergies  Allergen Reactions  . Eggs Or Egg-Derived Products Hives and Swelling  . Latex Itching and Swelling    Prescriptions prior to admission  Medication Sig Dispense Refill Last Dose  . albuterol (PROVENTIL HFA;VENTOLIN HFA) 108 (90 BASE) MCG/ACT inhaler Inhale 1-2 puffs into the lungs every 4 (four) hours as needed for wheezing or shortness of breath. 1 Inhaler 2 01/02/2015 at Unknown time  . Prenatal Vit-Fe Fumarate-FA (PRENATAL MULTIVITAMIN) TABS tablet Take 1 tablet by mouth at bedtime.    01/01/2015 at Unknown time    ROS Physical Exam   Blood pressure 115/54, pulse 75, temperature 97.8 F (36.6 C), temperature source Oral, resp. rate 18, height 5\' 4"  (1.626 m), weight  83.008 kg (183 lb), last menstrual period 11/06/2014, SpO2 100 %.  Physical Exam  Nursing note and vitals reviewed. Constitutional: She is oriented to person, place, and time. She appears well-developed and well-nourished. No distress.  Cardiovascular: Normal rate.   Respiratory: Effort normal.  GI: Soft. There is no tenderness. There is no rebound.  Neurological: She is alert and oriented to person, place, and time.  Skin: Skin is warm and dry.  Psychiatric: She has a normal mood and affect.    MAU Course  Procedures  Results for orders placed or performed during the hospital encounter of 01/02/15 (from the past 24 hour(s))  Urinalysis, Routine w reflex microscopic     Status: None   Collection Time: 01/02/15  9:30 PM  Result Value Ref Range   Color, Urine YELLOW YELLOW   APPearance CLEAR CLEAR   Specific Gravity, Urine 1.025 1.005 - 1.030   pH 6.0 5.0 - 8.0   Glucose, UA NEGATIVE NEGATIVE mg/dL   Hgb urine dipstick NEGATIVE NEGATIVE   Bilirubin Urine NEGATIVE NEGATIVE   Ketones, ur NEGATIVE NEGATIVE mg/dL   Protein, ur NEGATIVE NEGATIVE mg/dL   Urobilinogen, UA 0.2 0.0 - 1.0 mg/dL   Nitrite NEGATIVE NEGATIVE   Leukocytes, UA NEGATIVE NEGATIVE  Wet prep, genital  Status: Abnormal   Collection Time: 01/02/15 10:00 PM  Result Value Ref Range   Yeast Wet Prep HPF POC NONE SEEN NONE SEEN   Trich, Wet Prep NONE SEEN NONE SEEN   Clue Cells Wet Prep HPF POC NONE SEEN NONE SEEN   WBC, Wet Prep HPF POC FEW (A) NONE SEEN     Assessment and Plan   1. Possible exposure to STD    DC home Treated here in MAU with 1g Azithromycin and  Rocephin Encouraged patient start [facility] as soon as possible Return to MAU as needed First trimester precautions reviewed   Follow-up Information    Follow up with [facility].   Specialty:  Obstetrics and Gynecology   Contact information:   [address], [address] [place] [address] [phone]        Tawnya Crook 01/02/2015, 10:07 PM

## 2015-01-03 LAB — GC/CHLAMYDIA PROBE AMP (~~LOC~~) NOT AT ARMC
CHLAMYDIA, DNA PROBE: NEGATIVE
Neisseria Gonorrhea: NEGATIVE

## 2015-01-09 ENCOUNTER — Telehealth: Payer: Self-pay | Admitting: *Deleted

## 2015-01-09 NOTE — Telephone Encounter (Signed)
Patient requesting a NOB appointment. Patient has previously delivered a baby with our office. Patient states her Medicaid will be active as of January 16, 2015. Patient scheduled for 01-24-15 @ 1 pm. Patient advised of no show policy.

## 2015-01-22 ENCOUNTER — Inpatient Hospital Stay (HOSPITAL_COMMUNITY)
Admission: AD | Admit: 2015-01-22 | Discharge: 2015-01-23 | Disposition: A | Discharge status: Home / Self Care | Payer: Medicaid Other | Source: Ambulatory Visit | Attending: Obstetrics | Admitting: Obstetrics

## 2015-01-22 DIAGNOSIS — R1030 Lower abdominal pain, unspecified: Secondary | ICD-10-CM | POA: Diagnosis present

## 2015-01-22 DIAGNOSIS — O9989 Other specified diseases and conditions complicating pregnancy, childbirth and the puerperium: Secondary | ICD-10-CM | POA: Insufficient documentation

## 2015-01-22 DIAGNOSIS — Z3A11 11 weeks gestation of pregnancy: Secondary | ICD-10-CM | POA: Insufficient documentation

## 2015-01-22 DIAGNOSIS — O26899 Other specified pregnancy related conditions, unspecified trimester: Secondary | ICD-10-CM

## 2015-01-22 DIAGNOSIS — R109 Unspecified abdominal pain: Secondary | ICD-10-CM

## 2015-01-22 NOTE — MAU Note (Signed)
Lower abdominal pain since this morning, was sharp but now describes as "annoying". Denies vaginal bleeding. White vaginal discharge; no odor or irritation. Nausea, but not vomiting. Last BM 2 days ago. Denies urinary complaints. Had chills earlier today, but didn't check temp .

## 2015-01-22 NOTE — MAU Provider Note (Signed)
Chief Complaint: Abdominal Pain   First Provider Initiated Contact with Patient 01/23/15 0049      SUBJECTIVE HPI: Lauren Conway is a 24 y.o. G3P1011 at 7710w6d by LMP who presents with constant low abd pain and chills today. Denies vaginal bleeding, vaginal discharge. Hasn't taken temp. [facility]. Normal US 12/18/14 showing live SIUP.   Past Medical History  Diagnosis Date  . History of gonorrhea     12/2008; 09/2010;   . History of chlamydia     12/2008; 03/09/2009  . Asthma    OB History  Gravida Para Term Preterm AB SAB TAB Ectopic Multiple Living  3 1 1  1 1    1     # Outcome Date GA Lbr Len/2nd Weight Sex Delivery Anes PTL Lv  3 Current           2 Term 03/19/13 15100w1d 14:40 / 00:34 2.88 kg (6 lb 5.6 oz) F Vag-Spont EPI  Y     Comments: umbilical hernia  1 SAB  1951w0d            Comments: no complication     Past Surgical History  Procedure Laterality Date  . No past surgeries     History   Social History  . Marital Status: Single    Spouse Name: N/A    Number of Children: N/A  . Years of Education: N/A   Occupational History  . Not on file.   Social History Main Topics  . Smoking status: Former Smoker -- 0.25 packs/day  . Smokeless tobacco: Never Used  . Alcohol Use: No     Comment: Rare  . Drug Use: No  . Sexual Activity:    Partners: Male    Birth Control/ Protection: None     Comment: Pt wants to use Depo Provera for birth control.   Other Topics Concern  . Not on file   Social History Narrative   No current facility-administered medications on file prior to encounter.   Current Outpatient Prescriptions on File Prior to Encounter  Medication Sig Dispense Refill  . albuterol (PROVENTIL HFA;VENTOLIN HFA) 108 (90 BASE) MCG/ACT inhaler Inhale 1-2 puffs into the lungs every 4 (four) hours as needed for wheezing or shortness of breath. 1 Inhaler 2  . Prenatal Vit-Fe Fumarate-FA (PRENATAL MULTIVITAMIN)  TABS tablet Take 1 tablet by mouth at bedtime.      Allergies  Allergen Reactions  . Eggs Or Egg-Derived Products Hives and Swelling  . Latex Itching and Swelling   Review of Systems  Constitutional: Positive for chills. Negative for malaise/fatigue.  HENT: Negative for congestion and sore throat.   Respiratory: Negative for cough and shortness of breath.   Gastrointestinal: Positive for abdominal pain. Negative for nausea, vomiting, diarrhea and constipation.  Genitourinary: Negative for dysuria, urgency, frequency, hematuria and flank pain.  Musculoskeletal: Negative for myalgias.     OBJECTIVE Blood pressure 111/62, pulse 91, temperature 98 F (36.7 C), temperature source Oral, resp. rate 18, last menstrual period 11/06/2014, not currently breastfeeding. GENERAL: Well-developed, well-nourished female in no acute distress.  HEENT: Normocephalic HEART: normal rate RESP: normal effort ABDOMEN: Soft, mild bilat groin tenderness. Pos BS x 4. No CVAT.  EXTREMITIES: Nontender, no edema NEURO: Alert and oriented SPECULUM EXAM: NEFG, Declined repeat wet prep, GC/Chlam cultures BIMANUAL: cervix closed; uterus 10-week size, no adnexal tenderness or masses FHR 172 by doppler. Very reassured to hear FHR.   LAB RESULTS Results  for orders placed or performed during the hospital encounter of 01/22/15 (from the past 24 hour(s))  Urinalysis, Routine w reflex microscopic     Status: None   Collection Time: 01/22/15 11:37 PM  Result Value Ref Range   Color, Urine YELLOW YELLOW   APPearance CLEAR CLEAR   Specific Gravity, Urine 1.015 1.005 - 1.030   pH 6.0 5.0 - 8.0   Glucose, UA NEGATIVE NEGATIVE mg/dL   Hgb urine dipstick NEGATIVE NEGATIVE   Bilirubin Urine NEGATIVE NEGATIVE   Ketones, ur NEGATIVE NEGATIVE mg/dL   Protein, ur NEGATIVE NEGATIVE mg/dL   Urobilinogen, UA 0.2 0.0 - 1.0 mg/dL   Nitrite NEGATIVE NEGATIVE   Leukocytes, UA NEGATIVE NEGATIVE  CBC     Status: Abnormal    Collection Time: 01/23/15 12:50 AM  Result Value Ref Range   WBC 9.5 4.0 - 10.5 K/uL   RBC 4.00 3.87 - 5.11 MIL/uL   Hemoglobin 12.3 12.0 - 15.0 g/dL   HCT 16.1 (L) 09.6 - 04.5 %   MCV 89.3 78.0 - 100.0 fL   MCH 30.8 26.0 - 34.0 pg   MCHC 34.5 30.0 - 36.0 g/dL   RDW 40.9 81.1 - 91.4 %   Platelets 306 150 - 400 K/uL    IMAGING No results found.  MAU COURSE  ASSESSMENT 1. Abdominal pain affecting pregnancy, antepartum    PLAN Discharge home in stable condition.  First trimester precautions. Comfort measures. Tylenol PRN.      Follow-up Information    Follow up with [facility] On 01/24/2015.   Specialty:  Obstetrics and Gynecology   Why:  As scheduled   Contact information:   [address], [address] [place] [address] [phone]      Follow up with [facility].   Why:  As needed in emergencies   Contact information:   [address] [id number] mc [place] [address] [phone]       Medication List    TAKE these medications        albuterol 108 (90 BASE) MCG/ACT inhaler  Commonly known as:  PROVENTIL HFA;VENTOLIN HFA  Inhale 1-2 puffs into the lungs every 4 (four) hours as needed for wheezing or shortness of breath.     ibuprofen 600 MG tablet  Commonly known as:  ADVIL,MOTRIN  Take 1 tablet (600 mg total) by mouth every 6 (six) hours as needed. Do not use after [redacted] weeks gestation.     prenatal multivitamin Tabs tablet  Take 1 tablet by mouth at bedtime.        [place], CNM 01/23/2015  2:14 AM

## 2015-01-22 NOTE — MAU Note (Signed)
Pt reports lower abd pain and chills

## 2015-01-23 ENCOUNTER — Encounter (HOSPITAL_COMMUNITY): Payer: Self-pay

## 2015-01-23 DIAGNOSIS — R109 Unspecified abdominal pain: Secondary | ICD-10-CM

## 2015-01-23 DIAGNOSIS — O9989 Other specified diseases and conditions complicating pregnancy, childbirth and the puerperium: Secondary | ICD-10-CM

## 2015-01-23 DIAGNOSIS — Z3A10 10 weeks gestation of pregnancy: Secondary | ICD-10-CM

## 2015-01-23 LAB — URINALYSIS, ROUTINE W REFLEX MICROSCOPIC
Bilirubin Urine: NEGATIVE
GLUCOSE, UA: NEGATIVE mg/dL
HGB URINE DIPSTICK: NEGATIVE
Ketones, ur: NEGATIVE mg/dL
Leukocytes, UA: NEGATIVE
NITRITE: NEGATIVE
PH: 6 (ref 5.0–8.0)
Protein, ur: NEGATIVE mg/dL
Specific Gravity, Urine: 1.015 (ref 1.005–1.030)
Urobilinogen, UA: 0.2 mg/dL (ref 0.0–1.0)

## 2015-01-23 LAB — CBC
HCT: 35.7 % — ABNORMAL LOW (ref 36.0–46.0)
Hemoglobin: 12.3 g/dL (ref 12.0–15.0)
MCH: 30.8 pg (ref 26.0–34.0)
MCHC: 34.5 g/dL (ref 30.0–36.0)
MCV: 89.3 fL (ref 78.0–100.0)
PLATELETS: 306 10*3/uL (ref 150–400)
RBC: 4 MIL/uL (ref 3.87–5.11)
RDW: 12 % (ref 11.5–15.5)
WBC: 9.5 10*3/uL (ref 4.0–10.5)

## 2015-01-23 MED ORDER — IBUPROFEN 600 MG PO TABS: 600.0000 mg | ORAL_TABLET | Freq: Four times a day (QID) | ORAL | Status: DC | PRN

## 2015-01-23 NOTE — Discharge Instructions (Signed)

## 2015-01-24 ENCOUNTER — Encounter: Payer: Medicaid Other | Admitting: Obstetrics

## 2015-01-24 LAB — HIV ANTIBODY (ROUTINE TESTING W REFLEX): HIV Screen 4th Generation wRfx: NONREACTIVE

## 2015-01-31 ENCOUNTER — Inpatient Hospital Stay (HOSPITAL_COMMUNITY)
Admission: AD | Admit: 2015-01-31 | Discharge: 2015-01-31 | Disposition: A | Discharge status: Home / Self Care | Payer: Medicaid Other | Source: Ambulatory Visit | Attending: Obstetrics | Admitting: Obstetrics

## 2015-01-31 ENCOUNTER — Inpatient Hospital Stay (HOSPITAL_COMMUNITY): Payer: Medicaid Other

## 2015-01-31 ENCOUNTER — Encounter (HOSPITAL_COMMUNITY): Payer: Self-pay | Admitting: General Practice

## 2015-01-31 DIAGNOSIS — Z3A12 12 weeks gestation of pregnancy: Secondary | ICD-10-CM | POA: Diagnosis present

## 2015-01-31 DIAGNOSIS — Z87891 Personal history of nicotine dependence: Secondary | ICD-10-CM | POA: Insufficient documentation

## 2015-01-31 DIAGNOSIS — O209 Hemorrhage in early pregnancy, unspecified: Secondary | ICD-10-CM

## 2015-01-31 DIAGNOSIS — O26851 Spotting complicating pregnancy, first trimester: Secondary | ICD-10-CM | POA: Insufficient documentation

## 2015-01-31 DIAGNOSIS — O4691 Antepartum hemorrhage, unspecified, first trimester: Secondary | ICD-10-CM

## 2015-01-31 LAB — URINALYSIS, ROUTINE W REFLEX MICROSCOPIC
Bilirubin Urine: NEGATIVE
GLUCOSE, UA: NEGATIVE mg/dL
KETONES UR: NEGATIVE mg/dL
Leukocytes, UA: NEGATIVE
NITRITE: NEGATIVE
PH: 6 (ref 5.0–8.0)
Protein, ur: NEGATIVE mg/dL
SPECIFIC GRAVITY, URINE: 1.025 (ref 1.005–1.030)
Urobilinogen, UA: 0.2 mg/dL (ref 0.0–1.0)

## 2015-01-31 LAB — CBC
HEMATOCRIT: 35.1 % — AB (ref 36.0–46.0)
Hemoglobin: 12.1 g/dL (ref 12.0–15.0)
MCH: 30.9 pg (ref 26.0–34.0)
MCHC: 34.5 g/dL (ref 30.0–36.0)
MCV: 89.5 fL (ref 78.0–100.0)
Platelets: 300 10*3/uL (ref 150–400)
RBC: 3.92 MIL/uL (ref 3.87–5.11)
RDW: 12.2 % (ref 11.5–15.5)
WBC: 9.1 10*3/uL (ref 4.0–10.5)

## 2015-01-31 LAB — URINE MICROSCOPIC-ADD ON

## 2015-01-31 LAB — WET PREP, GENITAL
CLUE CELLS WET PREP: NONE SEEN
TRICH WET PREP: NONE SEEN
Yeast Wet Prep HPF POC: NONE SEEN

## 2015-01-31 NOTE — MAU Note (Signed)
About an hour ago, was on the phone. Felt some wetness, noted some brownish red. Noted bright red after urination. Is cramping in lower abd,not as sharp as prior to bleeding.

## 2015-01-31 NOTE — MAU Provider Note (Signed)
Chief Complaint: Abdominal Pain and Vaginal Bleeding   First Provider Initiated Contact with Patient 01/31/15 1706     SUBJECTIVE HPI: Lauren Conway is a 24 y.o. G3P1011 at [redacted]w[redacted]d by LMP who presents to maternity admissions reporting dark brown spotting off and on today and mild abdominal cramping.  She was seen in MAU 12/18/14 and had normal IUP at [redacted]w[redacted]d with small [facility].  She denies bleeding since this visit until today.  She denies LOF, vaginal itching/burning, urinary symptoms, h/a, dizziness, n/v, or fever/chills.  She saw Dr Tamela Oddi in her previous pregnancy but desires a female provider and is interested in waterbirth with [facility].     Past Medical History  Diagnosis Date  . History of gonorrhea     12/2008; 09/2010;   . History of chlamydia     12/2008; 03/09/2009  . Asthma    Past Surgical History  Procedure Laterality Date  . No past surgeries     History   Social History  . Marital Status: Single    Spouse Name: N/A  . Number of Children: N/A  . Years of Education: N/A   Occupational History  . Not on file.   Social History Main Topics  . Smoking status: Former Smoker -- 0.25 packs/day  . Smokeless tobacco: Never Used  . Alcohol Use: No     Comment: Rare  . Drug Use: No  . Sexual Activity:    Partners: Male    Birth Control/ Protection: None     Comment: Pt wants to use Depo Provera for birth control.   Other Topics Concern  . Not on file   Social History Narrative   No current facility-administered medications on file prior to encounter.   Current Outpatient Prescriptions on File Prior to Encounter  Medication Sig Dispense Refill  . albuterol (PROVENTIL HFA;VENTOLIN HFA) 108 (90 BASE) MCG/ACT inhaler Inhale 1-2 puffs into the lungs every 4 (four) hours as needed for wheezing or shortness of breath. 1 Inhaler 2  . ibuprofen (ADVIL,MOTRIN) 600 MG tablet Take 1 tablet (600 mg total) by mouth every 6 (six) hours as needed. Do not use after [redacted] weeks  gestation. (Patient not taking: Reported on 01/31/2015) 30 tablet 1   Allergies  Allergen Reactions  . Eggs Or Egg-Derived Products Hives and Swelling  . Latex Itching and Swelling    ROS: Pertinent items in HPI  OBJECTIVE Blood pressure 130/65, pulse 84, temperature 98.8 F (37.1 C), temperature source Oral, resp. rate 18, weight 177 lb (80.287 kg), last menstrual period 11/06/2014, not currently breastfeeding. GENERAL: Well-developed, well-nourished female in no acute distress.  HEENT: Normocephalic HEART: normal rate RESP: normal effort ABDOMEN: Soft, non-tender EXTREMITIES: Nontender, no edema NEURO: Alert and oriented Pelvic exam: Cervix pink, visually closed, without lesion, scant light brown discharge, vaginal walls and external genitalia normal Bimanual exam: Cervix 0/long/high, firm, anterior, neg CMT, uterus nontender, ~12 week size, adnexa without tenderness, enlargement, or mass  LAB RESULTS Results for orders placed or performed during the hospital encounter of 01/31/15 (from the past 24 hour(s))  Urinalysis, Routine w reflex microscopic     Status: Abnormal   Collection Time: 01/31/15  4:07 PM  Result Value Ref Range   Color, Urine YELLOW YELLOW   APPearance CLEAR CLEAR   Specific Gravity, Urine 1.025 1.005 - 1.030   pH 6.0 5.0 - 8.0   Glucose, UA NEGATIVE NEGATIVE mg/dL   Hgb urine dipstick TRACE (A) NEGATIVE   Bilirubin Urine NEGATIVE NEGATIVE   Ketones,  ur NEGATIVE NEGATIVE mg/dL   Protein, ur NEGATIVE NEGATIVE mg/dL   Urobilinogen, UA 0.2 0.0 - 1.0 mg/dL   Nitrite NEGATIVE NEGATIVE   Leukocytes, UA NEGATIVE NEGATIVE  Urine microscopic-add on     Status: Abnormal   Collection Time: 01/31/15  4:07 PM  Result Value Ref Range   Squamous Epithelial / LPF FEW (A) RARE   RBC / HPF 0-2 <3 RBC/hpf   Urine-Other MUCOUS PRESENT   Wet prep, genital     Status: Abnormal   Collection Time: 01/31/15  5:15 PM  Result Value Ref Range   Yeast Wet Prep HPF POC NONE SEEN  NONE SEEN   Trich, Wet Prep NONE SEEN NONE SEEN   Clue Cells Wet Prep HPF POC NONE SEEN NONE SEEN   WBC, Wet Prep HPF POC FEW (A) NONE SEEN  CBC     Status: Abnormal   Collection Time: 01/31/15  5:28 PM  Result Value Ref Range   WBC 9.1 4.0 - 10.5 K/uL   RBC 3.92 3.87 - 5.11 MIL/uL   Hemoglobin 12.1 12.0 - 15.0 g/dL   HCT 19.135.1 (L) 47.836.0 - 29.546.0 %   MCV 89.5 78.0 - 100.0 fL   MCH 30.9 26.0 - 34.0 pg   MCHC 34.5 30.0 - 36.0 g/dL   RDW 62.112.2 30.811.5 - 65.715.5 %   Platelets 300 150 - 400 K/uL    IMAGING [place] Ob Comp Less 14 Wks  01/31/2015   CLINICAL DATA:  Vaginal bleeding in first trimester of pregnancy, vaginal bleeding and abdominal cramping for 2 hr ; LMP 11/06/2014 which corresponds to 12 weeks 2 days EGA  EXAM: OBSTETRIC <14 WK US AND TRANSVAGINAL OB US  TECHNIQUE: Both transabdominal and transvaginal ultrasound examinations were performed for complete evaluation of the gestation as well as the maternal uterus, adnexal regions, and pelvic cul-de-sac. Transvaginal technique was performed to assess early pregnancy.  COMPARISON:  12/18/2014  FINDINGS: Intrauterine gestational sac: Visualized/normal in shape.  Yolk sac:  Not identified  Embryo:  Present  Cardiac Activity: Present  Heart Rate: 150  bpm  CRL:  61.3  mm   12 w   5 d                  US EDC: 08/10/2015  Maternal uterus/adnexae:  Question small retraction at mid uterus.  No definite subchorionic hemorrhage identified ; a vague area of slight hypo echogenicity is identified on image 13 but this is not reliably reproduced in other projections, does not definitely represent a small subchorionic hemorrhage.  RIGHT ovary normal size and morphology 3.4 x 1.4 x 1.4 cm.  LEFT ovary normal size and morphology, 2.0 x 2.5 x 1.4 cm.  No adnexal masses or free pelvic fluid.  IMPRESSION: Single live intrauterine gestation as above.  No definite acute abnormalities as above.   Electronically Signed   By: Ulyses SouthwardMark  Boles M.D.   On: 01/31/2015 18:38     ASSESSMENT 1. Vaginal bleeding in pregnancy, first trimester     PLAN Discharge home with bleeding precautions Message sent to [facility] to start care Return to MAU as needed for emergencies    Medication List    STOP taking these medications        ibuprofen 600 MG tablet  Commonly known as:  ADVIL,MOTRIN      TAKE these medications        acetaminophen 500 MG tablet  Commonly known as:  TYLENOL  Take 500 mg by mouth every  6 (six) hours as needed for mild pain.     albuterol 108 (90 BASE) MCG/ACT inhaler  Commonly known as:  PROVENTIL HFA;VENTOLIN HFA  Inhale 1-2 puffs into the lungs every 4 (four) hours as needed for wheezing or shortness of breath.     [employer] PRENATAL GUMMY PO  Take 2 tablets by mouth daily.         Sharen Counter Certified Nurse-Midwife 01/31/2015  6:57 PM

## 2015-01-31 NOTE — Discharge Instructions (Signed)
Vaginal Bleeding During Pregnancy, First Trimester  A small amount of bleeding (spotting) from the vagina is relatively common in early pregnancy. It usually stops on its own. Various things may cause bleeding or spotting in early pregnancy. Some bleeding may be related to the pregnancy, and some may not. In most cases, the bleeding is normal and is not a problem. However, bleeding can also be a sign of something serious. Be sure to tell your health care provider about any vaginal bleeding right away.  Some possible causes of vaginal bleeding during the first trimester include:  · Infection or inflammation of the cervix.  · Growths (polyps) on the cervix.  · Miscarriage or threatened miscarriage.  · Pregnancy tissue has developed outside of the uterus and in a fallopian tube (tubal pregnancy).  · Tiny cysts have developed in the uterus instead of pregnancy tissue (molar pregnancy).  HOME CARE INSTRUCTIONS   Watch your condition for any changes. The following actions may help to lessen any discomfort you are feeling:  · Follow your health care provider's instructions for limiting your activity. If your health care provider orders bed rest, you may need to stay in bed and only get up to use the bathroom. However, your health care provider may allow you to continue light activity.  · If needed, make plans for someone to help with your regular activities and responsibilities while you are on bed rest.  · Keep track of the number of pads you use each day, how often you change pads, and how soaked (saturated) they are. Write this down.  · Do not use tampons. Do not douche.  · Do not have sexual intercourse or orgasms until approved by your health care provider.  · If you pass any tissue from your vagina, save the tissue so you can show it to your health care provider.  · Only take over-the-counter or prescription medicines as directed by your health care provider.  · Do not take aspirin because it can make you  bleed.  · Keep all follow-up appointments as directed by your health care provider.  SEEK MEDICAL CARE IF:  · You have any vaginal bleeding during any part of your pregnancy.  · You have cramps or labor pains.  · You have a fever, not controlled by medicine.  SEEK IMMEDIATE MEDICAL CARE IF:   · You have severe cramps in your back or belly (abdomen).  · You pass large clots or tissue from your vagina.  · Your bleeding increases.  · You feel light-headed or weak, or you have fainting episodes.  · You have chills.  · You are leaking fluid or have a gush of fluid from your vagina.  · You pass out while having a bowel movement.  MAKE SURE YOU:  · Understand these instructions.  · Will watch your condition.  · Will get help right away if you are not doing well or get worse.  Document Released: 09/11/2005 Document Revised: 12/07/2013 Document Reviewed: 08/09/2013  Exit[employer]® Patient Information ©2015 [employer]. This information is not intended to replace advice given to you by your health care provider. Make sure you discuss any questions you have with your health care provider.

## 2015-02-01 LAB — RPR: RPR Ser Ql: NONREACTIVE

## 2015-02-01 LAB — GC/CHLAMYDIA PROBE AMP (~~LOC~~) NOT AT ARMC
CHLAMYDIA, DNA PROBE: NEGATIVE
NEISSERIA GONORRHEA: NEGATIVE

## 2015-02-01 LAB — HIV ANTIBODY (ROUTINE TESTING W REFLEX): HIV Screen 4th Generation wRfx: NONREACTIVE

## 2015-02-02 ENCOUNTER — Telehealth: Payer: Self-pay | Admitting: *Deleted

## 2015-02-02 NOTE — Telephone Encounter (Signed)
Patient called to reschedule her NOB appointment. Patient has been schedule for 02-08-15 @ 2 pm. Reviewed no show policy with the patient. Patient verbalized understanding.

## 2015-02-08 ENCOUNTER — Ambulatory Visit (INDEPENDENT_AMBULATORY_CARE_PROVIDER_SITE_OTHER): Payer: Medicaid Other | Admitting: Certified Nurse Midwife

## 2015-02-08 VITALS — BP 115/64 | HR 99 | Wt 180.0 lb

## 2015-02-08 DIAGNOSIS — O2690 Pregnancy related conditions, unspecified, unspecified trimester: Secondary | ICD-10-CM | POA: Diagnosis not present

## 2015-02-08 DIAGNOSIS — Z3482 Encounter for supervision of other normal pregnancy, second trimester: Secondary | ICD-10-CM

## 2015-02-08 DIAGNOSIS — O219 Vomiting of pregnancy, unspecified: Secondary | ICD-10-CM

## 2015-02-08 LAB — POCT URINALYSIS DIPSTICK
Bilirubin, UA: NEGATIVE
Glucose, UA: NEGATIVE
Ketones, UA: NEGATIVE
Leukocytes, UA: NEGATIVE
Nitrite, UA: NEGATIVE
RBC UA: NEGATIVE
SPEC GRAV UA: 1.02
UROBILINOGEN UA: NEGATIVE
pH, UA: 6

## 2015-02-08 MED ORDER — ONDANSETRON HCL 4 MG PO TABS: 4.0000 mg | ORAL_TABLET | Freq: Three times a day (TID) | ORAL | Status: AC | PRN

## 2015-02-08 NOTE — Progress Notes (Signed)
Subjective:    Lauren Conway is being seen today for her first obstetrical visit.  This is not a planned pregnancy. She is at [redacted]w[redacted]d gestation. Her obstetrical history is significant for close spaced pregnancies. Relationship with FOB: significant other, not living together. Patient does intend to breast feed. Pregnancy history fully reviewed.  [facility] lower abdominal pain, bleeding and N&V this pregnancy.  Dating confirmed by early ultrasound.   The information documented in the HPI was reviewed and verified.  Menstrual History: OB History    Gravida Para Term Preterm AB TAB SAB Ectopic Multiple Living   Menarche age: 70  Patient's last menstrual period was 11/06/2014 (approximate).    Past Medical History  Diagnosis Date  . History of gonorrhea     12/2008; 09/2010;   . History of chlamydia     12/2008; 03/09/2009  . Asthma     Past Surgical History  Procedure Laterality Date  . No past surgeries       (Not in a hospital admission) Allergies  Allergen Reactions  . Eggs Or Egg-Derived Products Hives and Swelling  . Latex Itching and Swelling    History  Substance Use Topics  . Smoking status: Former Smoker -- 0.25 packs/day  . Smokeless tobacco: Never Used  . Alcohol Use: No     Comment: Rare    Family History  Problem Relation Age of Onset  . Other Neg Hx   . Hypertension Mother   . Asthma Maternal Grandmother      Review of Systems Constitutional: negative for weight loss Gastrointestinal: negative for vomiting, positive for GERD and nausea Genitourinary:negative for genital lesions and vaginal discharge and dysuria Musculoskeletal:negative for back pain Behavioral/Psych: negative for abusive relationship, depression, illegal drug usage and tobacco use    Objective:    BP 115/64 mmHg  Pulse 99  Wt 81.647 kg (180 lb)  LMP 11/06/2014 (Approximate) General Appearance:    Alert, cooperative, no  distress, appears stated age  Head:    Normocephalic, without obvious abnormality, atraumatic  Eyes:    PERRL, conjunctiva/corneas clear, EOM's intact, fundi    benign, both eyes  Ears:    Normal TM's and external ear canals, both ears  Nose:   Nares normal, septum midline, mucosa normal, no drainage    or sinus tenderness  Throat:   Lips, mucosa, and tongue normal; teeth and gums normal  Neck:   Supple, symmetrical, trachea midline, no adenopathy;    thyroid:  no enlargement/tenderness/nodules; no carotid   bruit or JVD  Back:     Symmetric, no curvature, ROM normal, no CVA tenderness  Lungs:     Clear to auscultation bilaterally, respirations unlabored  Chest Wall:    No tenderness or deformity   Heart:    Regular rate and rhythm, S1 and S2 normal, no murmur, rub   or gallop  Breast Exam:    No tenderness, masses, or nipple abnormality  Abdomen:     Soft, non-tender, bowel sounds active all four quadrants,    no masses, no organomegaly  Genitalia:    Normal female without lesion, discharge or tenderness  Extremities:   Extremities normal, atraumatic, no cyanosis or edema  Pulses:   2+ and symmetric all extremities  Skin:   Skin color, texture, turgor normal, no rashes or lesions  Lymph nodes:   Cervical, supraclavicular, nodes normal.  Axillary lymph nodes tender to palpation.    Neurologic:   CNII-XII intact, normal strength, sensation and reflexes    throughout      Pelvic: cervix normal in appearance, anterior, soft & palpates about 50% effaced.  External genitalia normal, no adnexal masses or   tenderness, uterus above the symphysis pubis and below the umbilicus, roughly 13 week size, shape, and consistency and vagina   normal without discharge    Lab Review Urine pregnancy test Labs reviewed yes Radiologic studies reviewed yes Assessment:    Pregnancy at 832w2d weeks   ? Cervical length.   GERD  Plan:      Prenatal vitamins.  Counseling provided regarding continued use  of seat belts, cessation of alcohol consumption, smoking or use of illicit drugs; infection precautions i.e., influenza/TDAP immunizations, toxoplasmosis,CMV, parvovirus, listeria and varicella; workplace safety, exercise during pregnancy; routine dental care, safe medications, sexual activity, hot tubs, saunas, pools, travel, caffeine use, fish and methlymercury, potential toxins, hair treatments, varicose veins Weight gain recommendations per IOM guidelines reviewed: underweight/BMI< 18.5--> gain 28 - 40 lbs; normal weight/BMI 18.5 - 24.9--> gain 25 - 35 lbs; overweight/BMI 25 - 29.9--> gain 15 - 25 lbs; obese/BMI >30->gain  11 - 20 lbs Problem list reviewed and updated. FIRST/CF mutation testing/NIPT/QUAD SCREEN/fragile X/Ashkenazi Jewish population testing/Spinal muscular atrophy discussed: declined. Role of ultrasound in pregnancy discussed; fetal survey: requested. Amniocentesis discussed: not indicated.  Meds ordered this encounter  Medications  . ondansetron (ZOFRAN) 4 MG tablet    Sig: Take 1 tablet (4 mg total) by mouth every 8 (eight) hours as needed for nausea or vomiting.    Dispense:  30 tablet    Refill:  1   Orders Placed This Encounter  Procedures  . Culture, OB Urine  . Obstetric panel    Standing Status: Future     Number of Occurrences:      Standing Expiration Date: 02/09/2016  . Hemoglobinopathy evaluation  . Varicella zoster antibody, IgG  . Vit D  25 hydroxy (rtn osteoporosis monitoring)  . TSH  . AMB referral to maternal fetal medicine    Referral Priority:  Routine    Referral Type:  Consultation    Number of Visits Requested:  1   Counseling regarding diet & treatment of GERD symptoms along with nausea.   Follow up in 4 weeks. 50% of 40 min visit spent on counseling and coordination of care.

## 2015-02-09 LAB — TSH: TSH: 2.353 u[IU]/mL (ref 0.350–4.500)

## 2015-02-09 LAB — CULTURE, OB URINE
Colony Count: NO GROWTH
Organism ID, Bacteria: NO GROWTH

## 2015-02-09 LAB — VARICELLA ZOSTER ANTIBODY, IGG: Varicella IgG: <10 Index (ref <135.00)

## 2015-02-09 LAB — PAP IG W/ RFLX HPV ASCU

## 2015-02-09 LAB — VITAMIN D 25 HYDROXY (VIT D DEFICIENCY, FRACTURES): Vit D, 25-Hydroxy: 19 ng/mL — ABNORMAL LOW (ref 30–100)

## 2015-02-10 LAB — HEMOGLOBINOPATHY EVALUATION
HEMOGLOBIN OTHER: 0 %
Hgb A2 Quant: 2.8 % (ref 2.2–3.2)
Hgb A: 97.2 % (ref 96.8–97.8)
Hgb F Quant: 0 % (ref 0.0–2.0)
Hgb S Quant: 0 %

## 2015-02-22 ENCOUNTER — Other Ambulatory Visit: Payer: Self-pay | Admitting: Obstetrics

## 2015-02-22 DIAGNOSIS — O3431 Maternal care for cervical incompetence, first trimester: Secondary | ICD-10-CM

## 2015-03-03 ENCOUNTER — Other Ambulatory Visit (HOSPITAL_COMMUNITY): Payer: Self-pay | Admitting: Maternal and Fetal Medicine

## 2015-03-03 ENCOUNTER — Encounter (HOSPITAL_COMMUNITY): Payer: Self-pay

## 2015-03-03 ENCOUNTER — Ambulatory Visit (HOSPITAL_COMMUNITY)
Admission: RE | Admit: 2015-03-03 | Discharge: 2015-03-03 | Disposition: A | Discharge status: Home / Self Care | Payer: Medicaid Other | Source: Ambulatory Visit | Attending: Certified Nurse Midwife | Admitting: Certified Nurse Midwife

## 2015-03-03 ENCOUNTER — Other Ambulatory Visit: Payer: Self-pay | Admitting: Obstetrics

## 2015-03-03 ENCOUNTER — Ambulatory Visit (HOSPITAL_COMMUNITY)
Admission: RE | Admit: 2015-03-03 | Discharge: 2015-03-03 | Disposition: A | Discharge status: Home / Self Care | Payer: Medicaid Other | Source: Ambulatory Visit | Attending: Obstetrics | Admitting: Obstetrics

## 2015-03-03 DIAGNOSIS — Z36 Encounter for antenatal screening of mother: Secondary | ICD-10-CM | POA: Insufficient documentation

## 2015-03-03 DIAGNOSIS — O3431 Maternal care for cervical incompetence, first trimester: Secondary | ICD-10-CM

## 2015-03-03 DIAGNOSIS — Z3689 Encounter for other specified antenatal screening: Secondary | ICD-10-CM

## 2015-03-03 DIAGNOSIS — O09212 Supervision of pregnancy with history of pre-term labor, second trimester: Secondary | ICD-10-CM | POA: Diagnosis not present

## 2015-03-03 DIAGNOSIS — Z3A16 16 weeks gestation of pregnancy: Secondary | ICD-10-CM | POA: Diagnosis not present

## 2015-03-03 DIAGNOSIS — IMO0002 Reserved for concepts with insufficient information to code with codable children: Secondary | ICD-10-CM

## 2015-03-03 DIAGNOSIS — Z87891 Personal history of nicotine dependence: Secondary | ICD-10-CM | POA: Insufficient documentation

## 2015-03-03 DIAGNOSIS — Z0489 Encounter for examination and observation for other specified reasons: Secondary | ICD-10-CM

## 2015-03-03 NOTE — Consult Note (Signed)
Maternal Fetal Medicine Consultation  Requesting Provider(s): Coral Ceoharles Harper, MD  Reason for consultation: Hx of preterm contractions without preterm delivery - recommendations  HPI: Lauren Conway is a 24 yo G3P1011 currently at 16w 5d who is seen for consultation and recommendations for management due to history of preterm contractions without preterm delivery. She previously had a 39 week delivery without complications.  During that pregnancy, she reports that she had preterm contractions at 20+ weeks. She was briefly treated ("took some pills" - unable to get more details). To the best of her knowledge, she was not followed for shortened cervix and delivered at term without complications.  She is without complaints today.  OB History: OB History    Gravida Para Term Preterm AB TAB SAB Ectopic Multiple Living   3 1 1  1  1   1       PMH:  Past Medical History  Diagnosis Date  . History of gonorrhea     12/2008; 09/2010;   . History of chlamydia     12/2008; 03/09/2009  . Asthma     PSH:  Past Surgical History  Procedure Laterality Date  . No past surgeries     Meds:  Current Outpatient Prescriptions on File Prior to Encounter  Medication Sig Dispense Refill  . acetaminophen (TYLENOL) 500 MG tablet Take 500 mg by mouth every 6 (six) hours as needed for mild pain.    Marland Kitchen. albuterol (PROVENTIL HFA;VENTOLIN HFA) 108 (90 BASE) MCG/ACT inhaler Inhale 1-2 puffs into the lungs every 4 (four) hours as needed for wheezing or shortness of breath. 1 Inhaler 2  . ondansetron (ZOFRAN) 4 MG tablet Take 1 tablet (4 mg total) by mouth every 8 (eight) hours as needed for nausea or vomiting. (Patient not taking: Reported on 03/03/2015) 30 tablet 1  . Prenatal Vit-Min-FA-Fish Oil ([employer] PRENATAL GUMMY PO) Take 2 tablets by mouth daily.     No current facility-administered medications on file prior to encounter.   Allergies:  Allergies  Allergen Reactions  . Eggs Or Egg-Derived Products Hives and  Swelling  . Latex Itching and Swelling   FH:  Family History  Problem Relation Age of Onset  . Other Neg Hx   . Hypertension Mother   . Asthma Maternal Grandmother    Soc:  History   Social History  . Marital Status: Single    Spouse Name: N/A  . Number of Children: N/A  . Years of Education: N/A   Occupational History  . Not on file.   Social History Main Topics  . Smoking status: Former Smoker -- 0.25 packs/day  . Smokeless tobacco: Never Used  . Alcohol Use: No     Comment: Rare  . Drug Use: No  . Sexual Activity:    Partners: Male    Birth Control/ Protection: None     Comment: Pt wants to use Depo Provera for birth control.   Other Topics Concern  . Not on file   Social History Narrative     PE:  109/62, 184#, 70  GEN: well-appearing female ABD: gravid, NT  Ultrasound: Single IUP a 16w 5d Limited ultrasound performed for cervical length TVUS - cervical length 3.9 cm without funneling or dynamic changes  A/P: 1) Single IUP at 16w 5d         2) Hx of preterm contractions in previous pregnancy, not associated with shortened cervical length and subsequent term delivery - plan follow up ultrasound in 2 weeks for anatomy.  Will  repeat cervical length at that time.  If normal, do not feel that there is an indication for continued cervical length surveillance or other interventions.   Thank you for the opportunity to be a part of the care of [facility]. Please contact our office if we can be of further assistance.   I spent approximately 20 minutes with this patient with over 50% of time spent in face-to-face counseling.  Alpha Gula, MD Maternal Fetal Medicine

## 2015-03-08 ENCOUNTER — Ambulatory Visit (INDEPENDENT_AMBULATORY_CARE_PROVIDER_SITE_OTHER): Payer: Medicaid Other | Admitting: Certified Nurse Midwife

## 2015-03-08 VITALS — BP 107/66 | HR 99 | Temp 97.8°F | Wt 185.0 lb

## 2015-03-08 DIAGNOSIS — Z3482 Encounter for supervision of other normal pregnancy, second trimester: Secondary | ICD-10-CM | POA: Diagnosis not present

## 2015-03-08 DIAGNOSIS — O219 Vomiting of pregnancy, unspecified: Secondary | ICD-10-CM

## 2015-03-08 LAB — POCT URINALYSIS DIPSTICK
Bilirubin, UA: NEGATIVE
GLUCOSE UA: NEGATIVE
KETONES UA: NEGATIVE
LEUKOCYTES UA: NEGATIVE
NITRITE UA: NEGATIVE
PH UA: 5
PROTEIN UA: NEGATIVE
RBC UA: NEGATIVE
SPEC GRAV UA: 1.02
Urobilinogen, UA: NEGATIVE

## 2015-03-08 MED ORDER — DOXYLAMINE-PYRIDOXINE 10-10 MG PO TBEC: DELAYED_RELEASE_TABLET | ORAL | Status: DC

## 2015-03-08 MED ORDER — ONDANSETRON 4 MG PO TBDP: 4.0000 mg | ORAL_TABLET | Freq: Three times a day (TID) | ORAL | Status: AC | PRN

## 2015-03-08 MED ORDER — DOXYLAMINE-PYRIDOXINE 10-10 MG PO TBEC: 1.0000 | DELAYED_RELEASE_TABLET | Freq: Three times a day (TID) | ORAL | Status: DC

## 2015-03-08 NOTE — Progress Notes (Signed)
  Subjective:    Lauren Conway is a 24 y.o. female being seen today for her obstetrical visit. She is at 4451w2d gestation. Patient reports: backache, no bleeding, no contractions, no cramping and no leaking.  + Fetal movement felt.  HA are improving.  Nausea/Vomiting is ongoing.  Still undecided on contraception postpartum.    Problem List Items Addressed This Visit    None    Visit Diagnoses    Encounter for supervision of other normal pregnancy in second trimester    -  Primary    Relevant Orders    POCT urinalysis dipstick (Completed)    Nausea and vomiting during pregnancy prior to [redacted] weeks gestation        Relevant Medications    ondansetron (ZOFRAN-ODT) disintegrating tablet    Doxylamine-Pyridoxine (DICLEGIS) 10-10 MG TBEC      Patient Active Problem List   Diagnosis Date Noted  . Chlamydia trachomatis infection 06/12/2014  . STD (sexually transmitted disease)     Objective:     BP 107/66 mmHg  Pulse 99  Temp(Src) 97.8 F (36.6 C)  Wt 83.915 kg (185 lb)  LMP 11/06/2014 (Approximate) Uterine Size: Below umbilicus   FHR: 150's.    Assessment:    Pregnancy @ 4551w2d  weeks Doing well   Hyperemesis Gravidum  Plan:    Problem list reviewed and updated. Labs reviewed. Follow up in 4 weeks. FIRST/CF mutation testing/NIPT/QUAD SCREEN/fragile X/Ashkenazi Jewish population testing/Spinal muscular atrophy discussed: declined. Role of ultrasound in pregnancy discussed; fetal survey: ordered. Amniocentesis discussed: not indicated.

## 2015-03-17 ENCOUNTER — Inpatient Hospital Stay (HOSPITAL_COMMUNITY)
Admission: AD | Admit: 2015-03-17 | Discharge: 2015-03-17 | Disposition: A | Discharge status: Home / Self Care | Payer: Medicaid Other | Source: Ambulatory Visit | Attending: Obstetrics | Admitting: Obstetrics

## 2015-03-17 DIAGNOSIS — O4702 False labor before 37 completed weeks of gestation, second trimester: Secondary | ICD-10-CM | POA: Insufficient documentation

## 2015-03-17 DIAGNOSIS — R109 Unspecified abdominal pain: Secondary | ICD-10-CM | POA: Diagnosis present

## 2015-03-17 DIAGNOSIS — O99612 Diseases of the digestive system complicating pregnancy, second trimester: Secondary | ICD-10-CM | POA: Insufficient documentation

## 2015-03-17 DIAGNOSIS — Z3A18 18 weeks gestation of pregnancy: Secondary | ICD-10-CM | POA: Insufficient documentation

## 2015-03-17 DIAGNOSIS — K5900 Constipation, unspecified: Secondary | ICD-10-CM | POA: Insufficient documentation

## 2015-03-17 DIAGNOSIS — O479 False labor, unspecified: Secondary | ICD-10-CM

## 2015-03-17 LAB — URINALYSIS, ROUTINE W REFLEX MICROSCOPIC
Bilirubin Urine: NEGATIVE
GLUCOSE, UA: NEGATIVE mg/dL
HGB URINE DIPSTICK: NEGATIVE
KETONES UR: NEGATIVE mg/dL
Leukocytes, UA: NEGATIVE
Nitrite: NEGATIVE
PROTEIN: NEGATIVE mg/dL
Specific Gravity, Urine: >1.03 — ABNORMAL HIGH (ref 1.005–1.030)
Urobilinogen, UA: 0.2 mg/dL (ref 0.0–1.0)
pH: 6 (ref 5.0–8.0)

## 2015-03-17 NOTE — Discharge Instructions (Signed)
Fetal Movement Counts °Patient Name: __________________________________________________ Patient Due Date: ____________________ °Performing a fetal movement count is highly recommended in high-risk pregnancies, but it is good for every pregnant woman to do. Your health care provider may ask you to start counting fetal movements at 28 weeks of the pregnancy. Fetal movements often increase: °· After eating a full meal. °· After physical activity. °· After eating or drinking something sweet or cold. °· At rest. °Pay attention to when you feel the baby is most active. This will help you notice a pattern of your baby's sleep and wake cycles and what factors contribute to an increase in fetal movement. It is important to perform a fetal movement count at the same time each day when your baby is normally most active.  °HOW TO COUNT FETAL MOVEMENTS °1. Find a quiet and comfortable area to sit or lie down on your left side. Lying on your left side provides the best blood and oxygen circulation to your baby. °2. Write down the day and time on a sheet of paper or in a journal. °3. Start counting kicks, flutters, swishes, rolls, or jabs in a 2-hour period. You should feel at least 10 movements within 2 hours. °4. If you do not feel 10 movements in 2 hours, wait 2-3 hours and count again. Look for a change in the pattern or not enough counts in 2 hours. °SEEK MEDICAL CARE IF: °· You feel less than 10 counts in 2 hours, tried twice. °· There is no movement in over an hour. °· The pattern is changing or taking longer each day to reach 10 counts in 2 hours. °· You feel the baby is not moving as he or she usually does. °Date: ____________ Movements: ____________ Start time: ____________ Finish time: ____________  °Date: ____________ Movements: ____________ Start time: ____________ Finish time: ____________ °Date: ____________ Movements: ____________ Start time: ____________ Finish time: ____________ °Date: ____________ Movements:  ____________ Start time: ____________ Finish time: ____________ °Date: ____________ Movements: ____________ Start time: ____________ Finish time: ____________ °Date: ____________ Movements: ____________ Start time: ____________ Finish time: ____________ °Date: ____________ Movements: ____________ Start time: ____________ Finish time: ____________ °Date: ____________ Movements: ____________ Start time: ____________ Finish time: ____________  °Date: ____________ Movements: ____________ Start time: ____________ Finish time: ____________ °Date: ____________ Movements: ____________ Start time: ____________ Finish time: ____________ °Date: ____________ Movements: ____________ Start time: ____________ Finish time: ____________ °Date: ____________ Movements: ____________ Start time: ____________ Finish time: ____________ °Date: ____________ Movements: ____________ Start time: ____________ Finish time: ____________ °Date: ____________ Movements: ____________ Start time: ____________ Finish time: ____________ °Date: ____________ Movements: ____________ Start time: ____________ Finish time: ____________  °Date: ____________ Movements: ____________ Start time: ____________ Finish time: ____________ °Date: ____________ Movements: ____________ Start time: ____________ Finish time: ____________ °Date: ____________ Movements: ____________ Start time: ____________ Finish time: ____________ °Date: ____________ Movements: ____________ Start time: ____________ Finish time: ____________ °Date: ____________ Movements: ____________ Start time: ____________ Finish time: ____________ °Date: ____________ Movements: ____________ Start time: ____________ Finish time: ____________ °Date: ____________ Movements: ____________ Start time: ____________ Finish time: ____________  °Date: ____________ Movements: ____________ Start time: ____________ Finish time: ____________ °Date: ____________ Movements: ____________ Start time: ____________ Finish  time: ____________ °Date: ____________ Movements: ____________ Start time: ____________ Finish time: ____________ °Date: ____________ Movements: ____________ Start time: ____________ Finish time: ____________ °Date: ____________ Movements: ____________ Start time: ____________ Finish time: ____________ °Date: ____________ Movements: ____________ Start time: ____________ Finish time: ____________ °Date: ____________ Movements: ____________ Start time: ____________ Finish time: ____________  °Date: ____________ Movements: ____________ Start time: ____________ Finish   time: ____________ °Date: ____________ Movements: ____________ Start time: ____________ Finish time: ____________ °Date: ____________ Movements: ____________ Start time: ____________ Finish time: ____________ °Date: ____________ Movements: ____________ Start time: ____________ Finish time: ____________ °Date: ____________ Movements: ____________ Start time: ____________ Finish time: ____________ °Date: ____________ Movements: ____________ Start time: ____________ Finish time: ____________ °Date: ____________ Movements: ____________ Start time: ____________ Finish time: ____________  °Date: ____________ Movements: ____________ Start time: ____________ Finish time: ____________ °Date: ____________ Movements: ____________ Start time: ____________ Finish time: ____________ °Date: ____________ Movements: ____________ Start time: ____________ Finish time: ____________ °Date: ____________ Movements: ____________ Start time: ____________ Finish time: ____________ °Date: ____________ Movements: ____________ Start time: ____________ Finish time: ____________ °Date: ____________ Movements: ____________ Start time: ____________ Finish time: ____________ °Date: ____________ Movements: ____________ Start time: ____________ Finish time: ____________  °Date: ____________ Movements: ____________ Start time: ____________ Finish time: ____________ °Date: ____________  Movements: ____________ Start time: ____________ Finish time: ____________ °Date: ____________ Movements: ____________ Start time: ____________ Finish time: ____________ °Date: ____________ Movements: ____________ Start time: ____________ Finish time: ____________ °Date: ____________ Movements: ____________ Start time: ____________ Finish time: ____________ °Date: ____________ Movements: ____________ Start time: ____________ Finish time: ____________ °Date: ____________ Movements: ____________ Start time: ____________ Finish time: ____________  °Date: ____________ Movements: ____________ Start time: ____________ Finish time: ____________ °Date: ____________ Movements: ____________ Start time: ____________ Finish time: ____________ °Date: ____________ Movements: ____________ Start time: ____________ Finish time: ____________ °Date: ____________ Movements: ____________ Start time: ____________ Finish time: ____________ °Date: ____________ Movements: ____________ Start time: ____________ Finish time: ____________ °Date: ____________ Movements: ____________ Start time: ____________ Finish time: ____________ °Document Released: 01/01/2007 Document Revised: 04/18/2014 Document Reviewed: 09/28/2012 °[employer]® Patient Information ©2015 [employer]. This information is not intended to replace advice given to you by your health care provider. Make sure you discuss any questions you have with your health care provider. °Braxton Hicks Contractions °Contractions of the uterus can occur throughout pregnancy. Contractions are not always a sign that you are in labor.  °WHAT ARE BRAXTON HICKS CONTRACTIONS?  °Contractions that occur before labor are called Braxton Hicks contractions, or false labor. Toward the end of pregnancy (32-34 weeks), these contractions can develop more often and may become more forceful. This is not true labor because these contractions do not result in opening (dilatation) and thinning of the cervix. They  are sometimes difficult to tell apart from true labor because these contractions can be forceful and people have different pain tolerances. You should not feel embarrassed if you go to the hospital with false labor. Sometimes, the only way to tell if you are in true labor is for your health care provider to look for changes in the cervix. °If there are no prenatal problems or other health problems associated with the pregnancy, it is completely safe to be sent home with false labor and await the onset of true labor. °HOW CAN YOU TELL THE DIFFERENCE BETWEEN TRUE AND FALSE LABOR? °False Labor °· The contractions of false labor are usually shorter and not as hard as those of true labor.   °· The contractions are usually irregular.   °· The contractions are often felt in the front of the lower abdomen and in the groin.   °· The contractions may go away when you walk around or change positions while lying down.   °· The contractions get weaker and are shorter lasting as time goes on.   °· The contractions do not usually become progressively stronger, regular, and closer together as with true labor.   °True Labor °5. Contractions in true labor last 30-70 seconds, become   very regular, usually become more intense, and increase in frequency.   °6. The contractions do not go away with walking.   °7. The discomfort is usually felt in the top of the uterus and spreads to the lower abdomen and low back.   °8. True labor can be determined by your health care provider with an exam. This will show that the cervix is dilating and getting thinner.   °WHAT TO REMEMBER °· Keep up with your usual exercises and follow other instructions given by your health care provider.   °· Take medicines as directed by your health care provider.   °· Keep your regular prenatal appointments.   °· Eat and drink lightly if you think you are going into labor.   °· If Braxton Hicks contractions are making you uncomfortable:   °· Change your position from  lying down or resting to walking, or from walking to resting.   °· Sit and rest in a tub of warm water.   °· Drink 2-3 glasses of water. Dehydration may cause these contractions.   °· Do slow and deep breathing several times an hour.   °WHEN SHOULD I SEEK IMMEDIATE MEDICAL CARE? °Seek immediate medical care if: °· Your contractions become stronger, more regular, and closer together.   °· You have fluid leaking or gushing from your vagina.   °· You have a fever.   °· You pass blood-tinged mucus.   °· You have vaginal bleeding.   °· You have continuous abdominal pain.   °· You have low back pain that you never had before.   °· You feel your baby's head pushing down and causing pelvic pressure.   °· Your baby is not moving as much as it used to.   °Document Released: 12/02/2005 Document Revised: 12/07/2013 Document Reviewed: 09/13/2013 °[employer]® Patient Information ©2015 [employer]. This information is not intended to replace advice given to you by your health care provider. Make sure you discuss any questions you have with your health care provider. ° °

## 2015-03-17 NOTE — [facility]U Note (Signed)
Pt presents to [facility]U with complaints of contractions that started today, reports constipation started approximately two weeks ago. Denies any vaginal bleeding or LOF

## 2015-03-17 NOTE — MAU Provider Note (Signed)
None     Chief Complaint:  Contractions   Lauren Conway is  24 y.o. G3P1011 at 3658w4d presents complaining of Contractions She went to her aunt's funeral today, and noticed she was cramping off and on throughout.  She had a BM last night that "took an hour and I had to flush 3 times.  It smelled really bad so I know it was old."  Obstetrical/Gynecological History: OB History    Gravida Para Term Preterm AB TAB SAB Ectopic Multiple Living   3 1 1  1  1   1      Past Medical History: Past Medical History  Diagnosis Date  . History of gonorrhea     12/2008; 09/2010;   . History of chlamydia     12/2008; 03/09/2009  . Asthma     Past Surgical History: Past Surgical History  Procedure Laterality Date  . No past surgeries      Family History: Family History  Problem Relation Age of Onset  . Other Neg Hx   . Hypertension Mother   . Asthma Maternal Grandmother     Social History: History  Substance Use Topics  . Smoking status: Former Smoker -- 0.25 packs/day  . Smokeless tobacco: Never Used  . Alcohol Use: No     Comment: Rare    Allergies:  Allergies  Allergen Reactions  . Eggs Or Egg-Derived Products Hives and Swelling  . Latex Itching and Swelling    Meds:  No prescriptions prior to admission    Review of Systems   Constitutional: Negative for fever and chills Eyes: Negative for visual disturbances Respiratory: Negative for shortness of breath, dyspnea Cardiovascular: Negative for chest pain or palpitations  Gastrointestinal: Negative for vomiting, diarrhea and constipation Genitourinary: Negative for dysuria and urgency Musculoskeletal: Negative for back pain, joint pain, myalgias  Neurological: Negative for dizziness and headaches     Physical Exam  Blood pressure 121/57, pulse 91, temperature 98.2 F (36.8 C), resp. rate 16, height 5\' 4"  (1.626 m), weight 84.369 kg (186 lb), last menstrual period 11/06/2014, not currently  breastfeeding. GENERAL: Well-developed, well-nourished female in no acute distress.  LUNGS: Clear to auscultation bilaterally.  HEART: Regular rate and rhythm. ABDOMEN: Soft, nontender, nondistended, gravid.  EXTREMITIES: Nontender, no edema, 2+ distal pulses. DTR's 2+ CERVICAL EXAM: Dilatation 0cm   Effacement Thick, firm   FHT: 140 doppler  Labs: Results for orders placed or performed during the hospital encounter of 03/17/15 (from the past 24 hour(s))  Urinalysis, Routine w reflex microscopic   Collection Time: 03/17/15  6:30 PM  Result Value Ref Range   Color, Urine YELLOW YELLOW   APPearance CLEAR CLEAR   Specific Gravity, Urine >1.030 (H) 1.005 - 1.030   pH 6.0 5.0 - 8.0   Glucose, UA NEGATIVE NEGATIVE mg/dL   Hgb urine dipstick NEGATIVE NEGATIVE   Bilirubin Urine NEGATIVE NEGATIVE   Ketones, ur NEGATIVE NEGATIVE mg/dL   Protein, ur NEGATIVE NEGATIVE mg/dL   Urobilinogen, UA 0.2 0.0 - 1.0 mg/dL   Nitrite NEGATIVE NEGATIVE   Leukocytes, UA NEGATIVE NEGATIVE   Imaging Studies:  [facility] Ob Transvaginal  03/03/2015   OBSTETRICAL ULTRASOUND: This exam was performed within a [facility]. The OB US report was generated in the AS system, and faxed to the ordering physician.   This report is available in the [facility] [employer] PACS. See the [facility] Obstetric US report via the Image Link.   Assessment: Lauren Conway is  24 y.o. G3P1011 at  [redacted]w[redacted]d presents with braxton hicks, mild constipation.  Plan: Increase PO fluid intake,  May also try stool softners  CRESENZO-DISHMAN,Lukas Pelcher 4/1/20167:53 PM

## 2015-03-21 ENCOUNTER — Ambulatory Visit (INDEPENDENT_AMBULATORY_CARE_PROVIDER_SITE_OTHER): Payer: Medicaid Other | Admitting: Certified Nurse Midwife

## 2015-03-21 ENCOUNTER — Encounter (HOSPITAL_COMMUNITY): Payer: Self-pay

## 2015-03-21 ENCOUNTER — Ambulatory Visit (HOSPITAL_COMMUNITY)
Admission: RE | Admit: 2015-03-21 | Discharge: 2015-03-21 | Disposition: A | Discharge status: Home / Self Care | Payer: Medicaid Other | Source: Ambulatory Visit | Attending: Obstetrics | Admitting: Obstetrics

## 2015-03-21 ENCOUNTER — Other Ambulatory Visit (HOSPITAL_COMMUNITY): Payer: Self-pay | Admitting: Maternal and Fetal Medicine

## 2015-03-21 VITALS — BP 114/66 | HR 95 | Temp 98.2°F | Wt 186.0 lb

## 2015-03-21 DIAGNOSIS — O09212 Supervision of pregnancy with history of pre-term labor, second trimester: Secondary | ICD-10-CM | POA: Insufficient documentation

## 2015-03-21 DIAGNOSIS — O3431 Maternal care for cervical incompetence, first trimester: Secondary | ICD-10-CM

## 2015-03-21 DIAGNOSIS — Z36 Encounter for antenatal screening of mother: Secondary | ICD-10-CM | POA: Diagnosis present

## 2015-03-21 DIAGNOSIS — IMO0002 Reserved for concepts with insufficient information to code with codable children: Secondary | ICD-10-CM

## 2015-03-21 DIAGNOSIS — Z3482 Encounter for supervision of other normal pregnancy, second trimester: Secondary | ICD-10-CM

## 2015-03-21 DIAGNOSIS — Z3A19 19 weeks gestation of pregnancy: Secondary | ICD-10-CM | POA: Insufficient documentation

## 2015-03-21 DIAGNOSIS — L739 Follicular disorder, unspecified: Secondary | ICD-10-CM

## 2015-03-21 DIAGNOSIS — Z3689 Encounter for other specified antenatal screening: Secondary | ICD-10-CM | POA: Insufficient documentation

## 2015-03-21 DIAGNOSIS — Z0489 Encounter for examination and observation for other specified reasons: Secondary | ICD-10-CM

## 2015-03-21 LAB — POCT URINALYSIS DIPSTICK
BILIRUBIN UA: NEGATIVE
Blood, UA: NEGATIVE
GLUCOSE UA: NEGATIVE
Ketones, UA: NEGATIVE
Leukocytes, UA: NEGATIVE
Nitrite, UA: NEGATIVE
Protein, UA: NEGATIVE
SPEC GRAV UA: 1.01
Urobilinogen, UA: NEGATIVE
pH, UA: 7

## 2015-03-21 MED ORDER — HYDROCORTISONE 1 % EX CREA: TOPICAL_CREAM | CUTANEOUS | Status: DC

## 2015-03-21 NOTE — Progress Notes (Signed)
Subjective:    Lauren Conway is a 24 y.o. female being seen today for her obstetrical visit. She is at 421w1d gestation. Patient reports: no bleeding, no contractions, no cramping, no leaking and consitpation and allergy symptoms of nasal drainage, itchy watery eyes, congestion . Fetal movement: normal.  Is having a gender reveal party, so she does not know the sex of the fetus. Had ultrasound today a few minutes ago.  Had cervical mucous on exam, MFM to repeat transvaginal ultrasound in 2 weeks and appointment for that US is scheduled.  She also has some issues with constipation, discussed OTC medications that were safe to use along with increasing fruits/vegtables/H2O intake.  Gave her suggestions of OTC medications that are safe in pregnancy for allergies.  Also, discussed folliculitis on labia/groin from shaving, cream given.   Problem List Items Addressed This Visit    None    Visit Diagnoses    Encounter for supervision of other normal pregnancy in second trimester    -  Primary    Relevant Orders    POCT urinalysis dipstick (Completed)    Folliculitis        Relevant Medications    hydrocortisone cream 1%      Patient Active Problem List   Diagnosis Date Noted  . Cervical insufficiency during pregnancy in first trimester, antepartum   . Encounter for fetal anatomic survey   . [redacted] weeks gestation of pregnancy   . Chlamydia trachomatis infection 06/12/2014  . STD (sexually transmitted disease)    Objective:    BP 114/66 mmHg  Pulse 95  Temp(Src) 98.2 F (36.8 C)  Wt 84.369 kg (186 lb)  LMP 11/06/2014 (Approximate) FHT: 150's BPM  Uterine Size: 19 cm and size equals dates     Assessment:    Pregnancy @ 481w1d    Groin/labial folliculitis Allergic rhinitis  Plan:    OBGCT: discussed. Signs and symptoms of preterm labor: discussed.  Labs, problem list reviewed and updated 2 hr GTT planned Follow up in 4 weeks/as previously scheduled.

## 2015-03-23 ENCOUNTER — Encounter (HOSPITAL_COMMUNITY): Payer: Self-pay | Admitting: *Deleted

## 2015-03-23 ENCOUNTER — Inpatient Hospital Stay (HOSPITAL_COMMUNITY)
Admission: AD | Admit: 2015-03-23 | Discharge: 2015-03-23 | Disposition: A | Discharge status: Home / Self Care | Payer: Medicaid Other | Source: Ambulatory Visit | Attending: Obstetrics | Admitting: Obstetrics

## 2015-03-23 DIAGNOSIS — J4521 Mild intermittent asthma with (acute) exacerbation: Secondary | ICD-10-CM | POA: Diagnosis not present

## 2015-03-23 DIAGNOSIS — Z3A19 19 weeks gestation of pregnancy: Secondary | ICD-10-CM | POA: Insufficient documentation

## 2015-03-23 DIAGNOSIS — R062 Wheezing: Secondary | ICD-10-CM | POA: Diagnosis present

## 2015-03-23 DIAGNOSIS — O99512 Diseases of the respiratory system complicating pregnancy, second trimester: Secondary | ICD-10-CM | POA: Diagnosis not present

## 2015-03-23 DIAGNOSIS — J45901 Unspecified asthma with (acute) exacerbation: Secondary | ICD-10-CM | POA: Insufficient documentation

## 2015-03-23 DIAGNOSIS — R05 Cough: Secondary | ICD-10-CM | POA: Insufficient documentation

## 2015-03-23 DIAGNOSIS — Z87891 Personal history of nicotine dependence: Secondary | ICD-10-CM | POA: Diagnosis not present

## 2015-03-23 LAB — URINALYSIS, ROUTINE W REFLEX MICROSCOPIC
Bilirubin Urine: NEGATIVE
Glucose, UA: NEGATIVE mg/dL
Hgb urine dipstick: NEGATIVE
KETONES UR: 15 mg/dL — AB
Leukocytes, UA: NEGATIVE
NITRITE: NEGATIVE
PROTEIN: NEGATIVE mg/dL
Specific Gravity, Urine: >1.03 — ABNORMAL HIGH (ref 1.005–1.030)
UROBILINOGEN UA: 0.2 mg/dL (ref 0.0–1.0)
pH: 6 (ref 5.0–8.0)

## 2015-03-23 MED ORDER — ALBUTEROL SULFATE (2.5 MG/3ML) 0.083% IN NEBU: 2.5000 mg | INHALATION_SOLUTION | Freq: Once | RESPIRATORY_TRACT | Status: AC

## 2015-03-23 MED ORDER — IPRATROPIUM BROMIDE 0.02 % IN SOLN: 0.5000 mg | Freq: Once | RESPIRATORY_TRACT | Status: AC

## 2015-03-23 MED ORDER — PREDNISONE 20 MG PO TABS
40.0000 mg | ORAL_TABLET | Freq: Once | ORAL | Status: AC
  Administered 2015-03-23: 40 mg via ORAL
  Filled 2015-03-23: qty 2

## 2015-03-23 MED ORDER — IPRATROPIUM-ALBUTEROL 0.5-2.5 (3) MG/3ML IN SOLN
3.0000 mL | RESPIRATORY_TRACT | Status: DC
  Administered 2015-03-23: 3 mL via RESPIRATORY_TRACT
  Filled 2015-03-23 (×5): qty 3

## 2015-03-23 MED ORDER — PREDNISONE 10 MG PO TABS: 20.0000 mg | ORAL_TABLET | Freq: Two times a day (BID) | ORAL | Status: DC

## 2015-03-23 MED ORDER — GUAIFENESIN 100 MG/5ML PO LIQD: 100.0000 mg | ORAL | Status: DC | PRN

## 2015-03-23 NOTE — MAU Note (Signed)
Pt. States she has asthma and ran out of inhaler this am. States she is having a hard time breathing and this began about an hour ago. Pt. States she has been having to use it every 2-4 hrs for the past two weeks due to wheezing and being winded or hot. Pt. States she noticed some bumps in her vaginal area Monday. Denies having herpes. Did see OB Tuesday and told pt. That her bumps were from shaving. Denies any LOF. Pt. States she has either white or yellow mucous when wiping. Pt. Goes to MFM for cervical lengths and had this done Tuesday and had an anatomy scan done at that time. Will see OB in 2 weeks.

## 2015-03-23 NOTE — Discharge Instructions (Signed)

## 2015-03-23 NOTE — MAU Provider Note (Signed)
CSN: [id number]     Arrival date & time 03/23/15  [id number] History   None    No chief complaint on file.    (Consider location/radiation/quality/duration/timing/severity/associated sxs/prior Treatment) Patient is a 24 y.o. female presenting with wheezing. The history is provided by the patient.  Wheezing  The current episode started yesterday. The problem occurs frequently. The problem has been gradually worsening. The problem is mild. Nothing relieves the symptoms. The symptoms are aggravated by activity and a supine position. Associated symptoms include cough and wheezing. The cough is non-productive. Her past medical history is significant for asthma. Urine output has been normal. The last void occurred less than 6 hours ago. There were no sick contacts.   Patient also complains of vaginal irritation.  Past Medical History  Diagnosis Date  . History of gonorrhea     12/2008; 09/2010;   . History of chlamydia     12/2008; 03/09/2009  . Asthma    Past Surgical History  Procedure Laterality Date  . No past surgeries     Family History  Problem Relation Age of Onset  . Other Neg Hx   . Hypertension Mother   . Asthma Maternal Grandmother    History  Substance Use Topics  . Smoking status: Former Smoker -- 0.25 packs/day  . Smokeless tobacco: Never Used  . Alcohol Use: No     Comment: Rare   OB History    Gravida Para Term Preterm AB TAB SAB Ectopic Multiple Living   3 1 1  1  1   1      Review of Systems  Respiratory: Positive for cough and wheezing.   Genitourinary:       Vaginal irritation  all other systems negative    Allergies  Eggs or egg-derived products and Latex  Home Medications   Prior to Admission medications   Medication Sig Start Date End Date Taking? Authorizing Provider  acetaminophen (TYLENOL) 500 MG tablet Take 500 mg by mouth every 6 (six) hours as needed for mild pain.   Yes Historical Provider, MD  albuterol (PROVENTIL HFA;VENTOLIN HFA) 108 (90  BASE) MCG/ACT inhaler Inhale 1-2 puffs into the lungs every 4 (four) hours as needed for wheezing or shortness of breath. 10/31/14  Yes Arthor CaptainAbigail Harris, PA-C  cetirizine (ZYRTEC) 10 MG tablet Take 10 mg by mouth daily as needed for allergies.   Yes Historical Provider, MD  Cholecalciferol (VITAMIN D) 2000 UNITS tablet Take 2,000 Units by mouth daily.   Yes Historical Provider, MD  Prenatal Vit-Fe Fumarate-FA (PRENATAL MULTIVITAMIN) TABS tablet Take 1 tablet by mouth daily at 12 noon.   Yes Historical Provider, MD  Doxylamine-Pyridoxine (DICLEGIS) 10-10 MG TBEC 1 tab in AM, 1 tab mid afternoon 2 tabs at bedtime. Max dose 4 tabs daily. Patient not taking: Reported on 03/17/2015 03/08/15   Roe Coombsachelle A Denney, CNM  hydrocortisone cream 1 % Apply to affected area 2 times daily Patient not taking: Reported on 03/23/2015 03/21/15 03/20/16  Rachelle A Denney, CNM  ondansetron (ZOFRAN ODT) 4 MG disintegrating tablet Take 1 tablet (4 mg total) by mouth every 8 (eight) hours as needed for nausea or vomiting. Patient not taking: Reported on 03/17/2015 03/08/15 04/08/15  Roe Coombsachelle A Denney, CNM   BP 117/65 mmHg  Pulse 97  Temp(Src) 97.6 F (36.4 C) (Oral)  Resp 20  Ht 5\' 4"  (1.626 m)  Wt 186 lb (84.369 kg)  BMI 31.91 kg/m2  SpO2 100%  LMP 11/06/2014 (Approximate) Physical Exam  Constitutional: She  is oriented to person, place, and time. She appears well-developed and well-nourished. No distress.  HENT:  Head: Normocephalic.  Mouth/Throat: Uvula is midline, oropharynx is clear and moist and mucous membranes are normal.  Eyes: Conjunctivae and EOM are normal.  Neck: Normal range of motion. Neck supple.  Cardiovascular: Normal rate.   Pulmonary/Chest: Effort normal. She has wheezes.  Bilateral inspiratory wheezing  Abdominal: Soft. There is no tenderness.  Genitourinary:  External genitalia with irritation where patient shaves, no lesions identified.   Musculoskeletal: Normal range of motion.  Neurological: She  is alert and oriented to person, place, and time. No cranial nerve deficit.  Skin: Skin is warm and dry.  Psychiatric: She has a normal mood and affect. Her behavior is normal.  Nursing note and vitals reviewed.   ED Course  Procedures (including critical care time) Albuterol/Atrovent Neb treatment given  Re assessment after treatment, patient feeling better continues to have some insp. Wheezes.   Labs Review Results for orders placed or performed during the hospital encounter of 03/23/15 (from the past 24 hour(s))  Urinalysis, Routine w reflex microscopic     Status: Abnormal   Collection Time: 03/23/15  7:30 PM  Result Value Ref Range   Color, Urine YELLOW YELLOW   APPearance CLEAR CLEAR   Specific Gravity, Urine >1.030 (H) 1.005 - 1.030   pH 6.0 5.0 - 8.0   Glucose, UA NEGATIVE NEGATIVE mg/dL   Hgb urine dipstick NEGATIVE NEGATIVE   Bilirubin Urine NEGATIVE NEGATIVE   Ketones, ur 15 (A) NEGATIVE mg/dL   Protein, ur NEGATIVE NEGATIVE mg/dL   Urobilinogen, UA 0.2 0.0 - 1.0 mg/dL   Nitrite NEGATIVE NEGATIVE   Leukocytes, UA NEGATIVE NEGATIVE     MDM  24 y.o. female with wheezing and hx of asthma. Stable for d/c with no respiratory distress. O2 Sat 100% on R/A. Vaginal irritation without lesions. Discussed with the patient clinical findings and plan of care. All questioned fully answered. Patient states that he CNM told her that the vaginal problem was just irritation too and gave her a cream but she has not used it. She will start using it as directed. She will follow up in the office as scheduled.  I have given the patient cough medication and a short course of prednisone.   Final diagnoses:  Asthma with acute exacerbation, mild intermittent

## 2015-03-23 NOTE — MAU Note (Signed)
Pt reports she has been using her inhaler a lot for the last 2 weeks and she ran out this am. Audible inspiratory wheezes.

## 2015-03-23 NOTE — MAU Note (Signed)
Respiratory called and made aware of breathing treatment order.

## 2015-03-30 ENCOUNTER — Ambulatory Visit: Payer: Medicaid Other | Admitting: Certified Nurse Midwife

## 2015-04-05 ENCOUNTER — Encounter: Payer: Medicaid Other | Admitting: Certified Nurse Midwife

## 2015-04-06 ENCOUNTER — Encounter: Payer: Self-pay | Admitting: Obstetrics

## 2015-04-06 ENCOUNTER — Ambulatory Visit (INDEPENDENT_AMBULATORY_CARE_PROVIDER_SITE_OTHER): Payer: Medicaid Other | Admitting: Certified Nurse Midwife

## 2015-04-06 ENCOUNTER — Ambulatory Visit (HOSPITAL_COMMUNITY): Payer: Medicaid Other

## 2015-04-06 VITALS — BP 123/76 | HR 96 | Temp 97.4°F | Wt 190.0 lb

## 2015-04-06 DIAGNOSIS — Z9109 Other allergy status, other than to drugs and biological substances: Secondary | ICD-10-CM

## 2015-04-06 DIAGNOSIS — Z3482 Encounter for supervision of other normal pregnancy, second trimester: Secondary | ICD-10-CM

## 2015-04-06 DIAGNOSIS — J45909 Unspecified asthma, uncomplicated: Secondary | ICD-10-CM

## 2015-04-06 DIAGNOSIS — O99519 Diseases of the respiratory system complicating pregnancy, unspecified trimester: Secondary | ICD-10-CM

## 2015-04-06 DIAGNOSIS — O9989 Other specified diseases and conditions complicating pregnancy, childbirth and the puerperium: Secondary | ICD-10-CM

## 2015-04-06 DIAGNOSIS — Z91048 Other nonmedicinal substance allergy status: Secondary | ICD-10-CM

## 2015-04-06 LAB — POCT URINALYSIS DIPSTICK
Bilirubin, UA: NEGATIVE
Blood, UA: NEGATIVE
Glucose, UA: NEGATIVE
KETONES UA: NEGATIVE
Leukocytes, UA: NEGATIVE
Nitrite, UA: NEGATIVE
PROTEIN UA: NEGATIVE
Spec Grav, UA: 1.015
UROBILINOGEN UA: NEGATIVE
pH, UA: 6.5

## 2015-04-06 MED ORDER — ALBUTEROL SULFATE HFA 108 (90 BASE) MCG/ACT IN AERS: 2.0000 | INHALATION_SPRAY | Freq: Four times a day (QID) | RESPIRATORY_TRACT | Status: DC | PRN

## 2015-04-06 MED ORDER — BUDESONIDE 90 MCG/ACT IN AEPB: 1.0000 | INHALATION_SPRAY | Freq: Two times a day (BID) | RESPIRATORY_TRACT | Status: DC

## 2015-04-06 MED ORDER — OLOPATADINE HCL 0.2 % OP SOLN: 1.0000 [drp] | Freq: Every day | OPHTHALMIC | Status: DC

## 2015-04-06 MED ORDER — FLUTICASONE PROPIONATE 50 MCG/ACT NA SUSP: 1.0000 | Freq: Two times a day (BID) | NASAL | Status: DC

## 2015-04-06 MED ORDER — MONTELUKAST SODIUM 10 MG PO TABS: 10.0000 mg | ORAL_TABLET | Freq: Every day | ORAL | Status: DC

## 2015-04-06 NOTE — Progress Notes (Signed)
Subjective:    Lauren Conway is a 24 y.o. female being seen today for her obstetrical visit. She is at 8443w3d gestation. Patient reports fatigue, no bleeding, no contractions, no cramping, no leaking and seasonal allergies with asthma exacerbation. Fetal movement: normal.  Menstrual History: OB History    Gravida Para Term Preterm AB TAB SAB Ectopic Multiple Living   3 1 1  1  1   1        Patient's last menstrual period was 11/06/2014 (approximate).    The following portions of the patient's history were reviewed and updated as appropriate: allergies, current medications, past family history, past medical history, past social history, past surgical history and problem list.  Review of Systems Respiratory: positive for asthma and wheezing   Objective:    BP 123/76 mmHg  Pulse 96  Temp(Src) 97.4 F (36.3 C)  Wt 86.183 kg (190 lb)  LMP 11/06/2014 (Approximate) FHT: 140's BPM  Uterine Size: at umbilicus   Peak flows improved with albuterol tx, before tx wheezing posteriorly after treatment no wheezing noted.    Assessment:    Pregnancy 21 and 3/7 weeks   Asthma with acute wheezing in office  Plan:    OBGCT: discussed. Signs and symptoms of preterm labor: discussed. Discussed s/s of asthma attack, when to go to the hospital and medications/treatments for asthma attack Medications ordered: Singulair, inhalers, eye drops and flonase.   Follow up in 1 weeks for evaluation of asthma treatment.

## 2015-04-11 ENCOUNTER — Encounter (HOSPITAL_COMMUNITY): Payer: Self-pay

## 2015-04-11 ENCOUNTER — Ambulatory Visit (HOSPITAL_COMMUNITY)
Admission: RE | Admit: 2015-04-11 | Discharge: 2015-04-11 | Disposition: A | Discharge status: Home / Self Care | Payer: Medicaid Other | Source: Ambulatory Visit | Attending: Maternal and Fetal Medicine | Admitting: Maternal and Fetal Medicine

## 2015-04-11 ENCOUNTER — Other Ambulatory Visit (HOSPITAL_COMMUNITY): Payer: Self-pay | Admitting: Maternal and Fetal Medicine

## 2015-04-11 DIAGNOSIS — O3441 Maternal care for other abnormalities of cervix, first trimester: Secondary | ICD-10-CM | POA: Diagnosis not present

## 2015-04-11 DIAGNOSIS — O3431 Maternal care for cervical incompetence, first trimester: Secondary | ICD-10-CM

## 2015-04-13 ENCOUNTER — Ambulatory Visit (INDEPENDENT_AMBULATORY_CARE_PROVIDER_SITE_OTHER): Payer: Medicaid Other | Admitting: Certified Nurse Midwife

## 2015-04-13 VITALS — BP 114/71 | HR 95 | Temp 97.9°F | Wt 193.0 lb

## 2015-04-13 DIAGNOSIS — Z3482 Encounter for supervision of other normal pregnancy, second trimester: Secondary | ICD-10-CM

## 2015-04-13 DIAGNOSIS — O219 Vomiting of pregnancy, unspecified: Secondary | ICD-10-CM

## 2015-04-13 DIAGNOSIS — B3731 Acute candidiasis of vulva and vagina: Secondary | ICD-10-CM

## 2015-04-13 DIAGNOSIS — B373 Candidiasis of vulva and vagina: Secondary | ICD-10-CM

## 2015-04-13 LAB — POCT URINALYSIS DIPSTICK
Bilirubin, UA: NEGATIVE
GLUCOSE UA: NEGATIVE
Ketones, UA: NEGATIVE
Leukocytes, UA: NEGATIVE
NITRITE UA: NEGATIVE
Protein, UA: NEGATIVE
RBC UA: NEGATIVE
Spec Grav, UA: 1.01
UROBILINOGEN UA: NEGATIVE
pH, UA: 8

## 2015-04-13 LAB — FETAL FIBRONECTIN: Fetal Fibronectin: POSITIVE — AB

## 2015-04-13 MED ORDER — FLUCONAZOLE 100 MG PO TABS
100.0000 mg | ORAL_TABLET | Freq: Once | ORAL | Status: DC
Start: 2015-04-13 — End: 2015-05-21

## 2015-04-13 MED ORDER — ONDANSETRON HCL 8 MG PO TABS: 8.0000 mg | ORAL_TABLET | Freq: Three times a day (TID) | ORAL | Status: DC | PRN

## 2015-04-13 NOTE — Progress Notes (Signed)
Subjective:    Lauren Conway is a 24 y.o. female being seen today for her obstetrical visit. She is at 8145w3d gestation. Patient reports: contractions since 04/12/15 in the late evening then woke up this morning with severe abdominal cramping, laid down after drinking water and the contractions eased up, fatigue, no bleeding, no leaking and vomiting . Patient reports vomiting one time this morning, has not vomited since beginning of pregnancy.  Denies any diarrhea.  Fetal movement: normal.  Denies any sexual intercourse since Feb.  Last cervical U/S on 04/11/15 was CL of 3.9cm, was a follow up for cervical mucus.  Patient reports improvement in her asthma/allergy symptoms, has been using her inhailers and other medications that were given to her at there last visit.   Problem List Items Addressed This Visit    None    Visit Diagnoses    Encounter for supervision of other normal pregnancy in second trimester    -  Primary    Relevant Orders    POCT urinalysis dipstick (Completed)    Glucose Tolerance, 2 Hours w/1 Hour    CBC    HIV antibody    RPR    Nausea and vomiting during pregnancy        Relevant Medications    ondansetron (ZOFRAN) 8 MG tablet    Vulvovaginal candidiasis        Relevant Medications    fluconazole (DIFLUCAN) 100 MG tablet      Patient Active Problem List   Diagnosis Date Noted  . Cervical insufficiency during pregnancy in first trimester, antepartum   . Encounter for fetal anatomic survey   . [redacted] weeks gestation of pregnancy   . Chlamydia trachomatis infection 06/12/2014  . STD (sexually transmitted disease)    Objective:    BP 114/71 mmHg  Pulse 95  Temp(Src) 97.9 F (36.6 C)  Wt 87.544 kg (193 lb)  LMP 11/06/2014 (Approximate)  Lungs: CTA all fields FHT: 148 BPM  Uterine Size: 21 cm and size equals dates   Cervix: anterior, long, thick, 1cm dilated, 0% effaced fetus is floating  Nitrazine: neg., no pooling of amniotic fluid and neg. cough test  Toco  only:  No contractions on monitor while in office  Assessment:    Pregnancy @ 4145w3d    ?PTL N&V Vulvovaginal Candidiasis  Plan:    OBGCT: discussed. Signs and symptoms of preterm labor: discussed and patient had hx of PTL with last pregnancy and delivered at term, she was given with that pregnancy betamethasone and terbulaline. FFN completed today Labs, problem list reviewed and updated 2 hr GTT with next visit Follow up in 2 weeks with OGTT.

## 2015-04-13 NOTE — Patient Instructions (Signed)

## 2015-04-13 NOTE — Addendum Note (Signed)
Addended by: Marya LandryFOSTER, Ronnett Pullin D on: 04/13/2015 11:03 AM   Modules accepted: Orders

## 2015-04-16 LAB — SURESWAB, VAGINOSIS/VAGINITIS PLUS
Atopobium vaginae: NOT DETECTED Log (cells/mL)
C. albicans, DNA: NOT DETECTED
C. glabrata, DNA: NOT DETECTED
C. parapsilosis, DNA: NOT DETECTED
C. trachomatis RNA, TMA: NOT DETECTED
C. tropicalis, DNA: NOT DETECTED
Gardnerella vaginalis: <4.7 Log (cells/mL)
LACTOBACILLUS SPECIES: 7.8 Log (cells/mL)
MEGASPHAERA SPECIES: NOT DETECTED Log (cells/mL)
N. gonorrhoeae RNA, TMA: NOT DETECTED
T. vaginalis RNA, QL TMA: NOT DETECTED

## 2015-04-18 ENCOUNTER — Ambulatory Visit (INDEPENDENT_AMBULATORY_CARE_PROVIDER_SITE_OTHER): Payer: Medicaid Other | Admitting: Certified Nurse Midwife

## 2015-04-18 VITALS — BP 119/72 | HR 96 | Temp 98.3°F | Wt 193.6 lb

## 2015-04-18 DIAGNOSIS — O4702 False labor before 37 completed weeks of gestation, second trimester: Secondary | ICD-10-CM

## 2015-04-18 LAB — POCT URINALYSIS DIPSTICK
Bilirubin, UA: NEGATIVE
Blood, UA: NEGATIVE
Glucose, UA: NEGATIVE
Ketones, UA: NEGATIVE
Leukocytes, UA: NEGATIVE
Nitrite, UA: NEGATIVE
Protein, UA: NEGATIVE
Spec Grav, UA: 1.015
Urobilinogen, UA: NEGATIVE
pH, UA: 6.5

## 2015-04-18 NOTE — Progress Notes (Signed)
Subjective:    Lauren Conway is a 24 y.o. female being seen today for her obstetrical visit. She is at 971w1d gestation. Patient reports no bleeding, no contractions, no cramping, no leaking and pelvic pressure. Fetal movement: normal.  Menstrual History: OB History    Gravida Para Term Preterm AB TAB SAB Ectopic Multiple Living   3 1 1  1  1   1        Patient's last menstrual period was 11/06/2014 (approximate).    The following portions of the patient's history were reviewed and updated as appropriate: allergies, current medications, past family history, past medical history, past social history, past surgical history and problem list.  Review of Systems A comprehensive review of systems was negative.   Objective:    BP 119/72 mmHg  Pulse 96  Temp(Src) 98.3 F (36.8 C)  Wt 87.816 kg (193 lb 9.6 oz)  LMP 11/06/2014 (Approximate) FHT: 145 BPM  Uterine Size: size equals dates   Cervix: unchanged from previous exam.  Assessment:    Pregnancy 23 and 1/7 weeks   Plan:   Repeat fFN today  OBGCT: discussed. Signs and symptoms of preterm labor: discussed.  MFM consult  Follow up in 1 weeks after MFM consult.

## 2015-04-19 ENCOUNTER — Inpatient Hospital Stay (HOSPITAL_COMMUNITY)
Admission: AD | Admit: 2015-04-19 | Discharge: 2015-04-20 | Disposition: A | Discharge status: Home / Self Care | Payer: Medicaid Other | Source: Ambulatory Visit | Attending: Obstetrics | Admitting: Obstetrics

## 2015-04-19 DIAGNOSIS — Z87891 Personal history of nicotine dependence: Secondary | ICD-10-CM | POA: Diagnosis not present

## 2015-04-19 DIAGNOSIS — J069 Acute upper respiratory infection, unspecified: Secondary | ICD-10-CM | POA: Insufficient documentation

## 2015-04-19 DIAGNOSIS — J029 Acute pharyngitis, unspecified: Secondary | ICD-10-CM | POA: Diagnosis present

## 2015-04-19 LAB — FETAL FIBRONECTIN: Fetal Fibronectin: NEGATIVE

## 2015-04-19 NOTE — MAU Note (Signed)
Pt reports she was feeling nauseated and didn't know why but then she found out she had been exposed to strep throat. States her throat has been sore.

## 2015-04-20 ENCOUNTER — Encounter (HOSPITAL_COMMUNITY): Payer: Self-pay

## 2015-04-20 DIAGNOSIS — J069 Acute upper respiratory infection, unspecified: Secondary | ICD-10-CM

## 2015-04-20 LAB — URINALYSIS, ROUTINE W REFLEX MICROSCOPIC
Bilirubin Urine: NEGATIVE
Glucose, UA: NEGATIVE mg/dL
Hgb urine dipstick: NEGATIVE
Ketones, ur: NEGATIVE mg/dL
Leukocytes, UA: NEGATIVE
Nitrite: NEGATIVE
Protein, ur: NEGATIVE mg/dL
Specific Gravity, Urine: 1.015 (ref 1.005–1.030)
Urobilinogen, UA: 0.2 mg/dL (ref 0.0–1.0)
pH: 5 (ref 5.0–8.0)

## 2015-04-20 LAB — RAPID STREP SCREEN (MED CTR MEBANE ONLY): Streptococcus, Group A Screen (Direct): NEGATIVE

## 2015-04-20 NOTE — Discharge Instructions (Signed)

## 2015-04-20 NOTE — MAU Provider Note (Signed)
History     CSN: [id number]  Arrival date and time: 04/19/15 2317   First Provider Initiated Contact with Patient 04/20/15 0016      Chief Complaint  Patient presents with  . Nausea  . Sore Throat   Sore Throat  This is a new problem. The current episode started today. The problem has been unchanged. Neither side of throat is experiencing more pain than the other. There has been no fever. The pain is at a severity of 7/10. Associated symptoms include coughing. Pertinent negatives include no abdominal pain, shortness of breath or vomiting. She has had exposure to strep. She has tried nothing for the symptoms.    Past Medical History  Diagnosis Date  . History of gonorrhea     12/2008; 09/2010;   . History of chlamydia     12/2008; 03/09/2009  . Asthma     Past Surgical History  Procedure Laterality Date  . No past surgeries      Family History  Problem Relation Age of Onset  . Other Neg Hx   . Hypertension Mother   . Asthma Maternal Grandmother     History  Substance Use Topics  . Smoking status: Former Smoker -- 0.25 packs/day  . Smokeless tobacco: Never Used  . Alcohol Use: No     Comment: Rare    Allergies:  Allergies  Allergen Reactions  . Eggs Or Egg-Derived Products Hives and Swelling  . Latex Itching and Swelling    Prescriptions prior to admission  Medication Sig Dispense Refill Last Dose  . albuterol (PROVENTIL HFA;VENTOLIN HFA) 108 (90 BASE) MCG/ACT inhaler Inhale 2 puffs into the lungs every 6 (six) hours as needed for wheezing or shortness of breath. 1 Inhaler 2 04/19/2015 at Unknown time  . Budesonide 90 MCG/ACT inhaler Inhale 1-2 puffs into the lungs 2 (two) times daily. 1 each 2 04/19/2015 at Unknown time  . cetirizine (ZYRTEC) 10 MG tablet Take 10 mg by mouth daily as needed for allergies.   Past Week at Unknown time  . Prenatal Vit-Fe Fumarate-FA (PRENATAL MULTIVITAMIN) TABS tablet Take 1 tablet by mouth daily at 12 noon.   Past Week at Unknown  time  . acetaminophen (TYLENOL) 500 MG tablet Take 500 mg by mouth every 6 (six) hours as needed for mild pain.   More than a month at Unknown time  . Cholecalciferol (VITAMIN D) 2000 UNITS tablet Take 2,000 Units by mouth daily.   More than a month at Unknown time  . Doxylamine-Pyridoxine (DICLEGIS) 10-10 MG TBEC 1 tab in AM, 1 tab mid afternoon 2 tabs at bedtime. Max dose 4 tabs daily. (Patient not taking: Reported on 03/17/2015) 100 tablet 3 Not Taking  . fluconazole (DIFLUCAN) 100 MG tablet Take 1 tablet (100 mg total) by mouth once. Repeat dose in 48-72 hours. 3 tablet 0 Taking  . fluticasone (FLONASE) 50 MCG/ACT nasal spray Place 1 spray into both nostrils 2 (two) times daily. 16 g 2 Taking  . guaiFENesin (ROBITUSSIN) 100 MG/5ML liquid Take 5-10 mLs (100-200 mg total) by mouth every 4 (four) hours as needed for cough. (Patient not taking: Reported on 04/06/2015) 118 mL 0 Not Taking  . hydrocortisone cream 1 % Apply to affected area 2 times daily (Patient not taking: Reported on 03/23/2015) 30 g 1 Not Taking  . montelukast (SINGULAIR) 10 MG tablet Take 1 tablet (10 mg total) by mouth at bedtime. 30 tablet 2 Taking  . Olopatadine HCl (PATADAY) 0.2 % SOLN Apply 1  drop to eye daily. 2.5 mL 2 Taking  . ondansetron (ZOFRAN) 8 MG tablet Take 1 tablet (8 mg total) by mouth every 8 (eight) hours as needed for nausea or vomiting. 20 tablet 0   . predniSONE (DELTASONE) 10 MG tablet Take 2 tablets (20 mg total) by mouth 2 (two) times daily with a meal. (Patient not taking: Reported on 04/06/2015) 15 tablet 0 Not Taking    Review of Systems  Constitutional: Negative for fever.  HENT: Positive for sore throat (itching in throat ).   Respiratory: Positive for cough. Negative for shortness of breath.   Gastrointestinal: Positive for nausea. Negative for vomiting and abdominal pain.   Physical Exam   Blood pressure 117/62, pulse 93, temperature 98.3 F (36.8 C), temperature source Oral, resp. rate 16, height  5\' 4"  (1.626 m), weight 88.451 kg (195 lb), last menstrual period 11/06/2014, SpO2 100 %.  Physical Exam  Nursing note and vitals reviewed. Constitutional: She is oriented to person, place, and time. She appears well-developed and well-nourished. No distress.  HENT:  Mouth/Throat: Oropharynx is clear and moist. No oropharyngeal exudate.  Cardiovascular: Normal rate.   Respiratory: Effort normal.  GI: Soft. There is no tenderness.  Neurological: She is alert and oriented to person, place, and time.  Skin: Skin is warm and dry.   FHT 150, moderate with 10x10 accels, no decels (appropriate for GA). Toco: no UCs MAU Course  Procedures  MDM   Assessment and Plan   1. Viral URI    DC home Strep test pending Comfort measures reviewed Return to MAU as needed  Follow-up Information    Follow up with Brock BadHARPER,CHARLES A, MD.   Specialty:  Obstetrics and Gynecology   Why:  As scheduled   Contact information:   [address] [place] [address] [phone]        Tawnya CrookHogan, [place] Stell Donovan 04/20/2015, 12:20 AM

## 2015-04-22 LAB — CULTURE, GROUP A STREP: Strep A Culture: NEGATIVE

## 2015-04-25 ENCOUNTER — Ambulatory Visit (HOSPITAL_COMMUNITY)
Admission: RE | Admit: 2015-04-25 | Discharge: 2015-04-25 | Disposition: A | Discharge status: Home / Self Care | Payer: Medicaid Other | Source: Ambulatory Visit | Attending: Obstetrics | Admitting: Obstetrics

## 2015-04-25 ENCOUNTER — Ambulatory Visit (HOSPITAL_COMMUNITY)
Admission: RE | Admit: 2015-04-25 | Discharge: 2015-04-25 | Disposition: A | Discharge status: Home / Self Care | Payer: Medicaid Other | Source: Ambulatory Visit | Attending: Certified Nurse Midwife | Admitting: Certified Nurse Midwife

## 2015-04-25 ENCOUNTER — Encounter (HOSPITAL_COMMUNITY): Payer: Self-pay

## 2015-04-25 DIAGNOSIS — Z36 Encounter for antenatal screening of mother: Secondary | ICD-10-CM | POA: Insufficient documentation

## 2015-04-25 DIAGNOSIS — O09292 Supervision of pregnancy with other poor reproductive or obstetric history, second trimester: Secondary | ICD-10-CM | POA: Insufficient documentation

## 2015-04-25 DIAGNOSIS — Z3A24 24 weeks gestation of pregnancy: Secondary | ICD-10-CM | POA: Diagnosis not present

## 2015-04-25 DIAGNOSIS — Z3482 Encounter for supervision of other normal pregnancy, second trimester: Secondary | ICD-10-CM

## 2015-04-25 DIAGNOSIS — O4702 False labor before 37 completed weeks of gestation, second trimester: Secondary | ICD-10-CM

## 2015-04-25 DIAGNOSIS — Z3689 Encounter for other specified antenatal screening: Secondary | ICD-10-CM

## 2015-04-25 DIAGNOSIS — O3431 Maternal care for cervical incompetence, first trimester: Secondary | ICD-10-CM

## 2015-04-25 NOTE — Progress Notes (Signed)
MFM consult, staff note   Impressions: SIUP at 978w2d EFW 75th%'le No dysmorphic features No previa Cervix is long and closed, measuring 4.6cm hx prior term delivery complicated by threatened preterm labor  Recommendations: I reviewed the patient's obstetrical history.  She has not experienced a spontaneous preterm delivery between 20 and 37 weeks.  She has a long and closed cervix.  Given that she is low risk for preterm delivery and does not meet criteria for progesterone supplementation by either injection or vaginal suppository, I recommend follow up as clinically indicated.  Time Spent: I spent in excess of 15 minutes in consultation with this patient to review records, evaluate her case, and provide her with an adequate discussion and education.  More than 50% of this time was spent in direct face-to-face counseling.  It was a pleasure seeing your patient in the office today.  Thank you for consultation. Please do not hesitate to contact our service for any further questions.   Thank you,  Louann SjogrenJeffrey Morgan Gaynelle Arabianenney   Ginna Schuur, Louann SjogrenJeffrey Morgan, MD, MS, FACOG Assistant Professor Section of Maternal-Fetal Medicine [facility]

## 2015-04-27 ENCOUNTER — Other Ambulatory Visit: Payer: Medicaid Other

## 2015-04-27 ENCOUNTER — Encounter: Payer: Medicaid Other | Admitting: Certified Nurse Midwife

## 2015-05-02 ENCOUNTER — Encounter: Payer: Medicaid Other | Admitting: Certified Nurse Midwife

## 2015-05-02 ENCOUNTER — Other Ambulatory Visit: Payer: Self-pay

## 2015-05-02 ENCOUNTER — Encounter: Payer: Self-pay | Admitting: Obstetrics

## 2015-05-02 ENCOUNTER — Encounter: Payer: Self-pay | Admitting: Certified Nurse Midwife

## 2015-05-03 ENCOUNTER — Encounter: Payer: Self-pay | Admitting: Obstetrics

## 2015-05-03 ENCOUNTER — Other Ambulatory Visit: Payer: Medicaid Other

## 2015-05-03 ENCOUNTER — Ambulatory Visit (INDEPENDENT_AMBULATORY_CARE_PROVIDER_SITE_OTHER): Payer: Medicaid Other | Admitting: Obstetrics

## 2015-05-03 VITALS — BP 121/72 | HR 109 | Temp 96.9°F | Wt 196.0 lb

## 2015-05-03 DIAGNOSIS — Z3482 Encounter for supervision of other normal pregnancy, second trimester: Secondary | ICD-10-CM | POA: Diagnosis not present

## 2015-05-03 LAB — CBC
HCT: 32.9 % — ABNORMAL LOW (ref 36.0–46.0)
Hemoglobin: 11 g/dL — ABNORMAL LOW (ref 12.0–15.0)
MCH: 29.9 pg (ref 26.0–34.0)
MCHC: 33.4 g/dL (ref 30.0–36.0)
MCV: 89.4 fL (ref 78.0–100.0)
MPV: 9.1 fL (ref 8.6–12.4)
Platelets: 339 10*3/uL (ref 150–400)
RBC: 3.68 MIL/uL — AB (ref 3.87–5.11)
RDW: 13.9 % (ref 11.5–15.5)
WBC: 9.7 10*3/uL (ref 4.0–10.5)

## 2015-05-03 LAB — POCT URINALYSIS DIPSTICK
Bilirubin, UA: NEGATIVE
GLUCOSE UA: NEGATIVE
Ketones, UA: NEGATIVE
Leukocytes, UA: NEGATIVE
Nitrite, UA: NEGATIVE
Protein, UA: NEGATIVE
RBC UA: NEGATIVE
SPEC GRAV UA: 1.015
UROBILINOGEN UA: NEGATIVE
pH, UA: 7

## 2015-05-03 NOTE — Progress Notes (Signed)
Subjective:    Lauren FuellingJatavia Demps is a 24 y.o. female being seen today for her obstetrical visit. She is at 8722w2d gestation. Patient reports: no complaints . Fetal movement: normal.  Problem List Items Addressed This Visit    None    Visit Diagnoses    Encounter for supervision of other normal pregnancy in second trimester    -  Primary    Relevant Orders    POCT urinalysis dipstick (Completed)    Glucose Tolerance, 2 Hours w/1 Hour    CBC    HIV antibody    RPR      Patient Active Problem List   Diagnosis Date Noted  . Cervical insufficiency during pregnancy in first trimester, antepartum   . Encounter for fetal anatomic survey   . Chlamydia trachomatis infection 06/12/2014  . STD (sexually transmitted disease)    Objective:    BP 121/72 mmHg  Pulse 109  Temp(Src) 96.9 F (36.1 C)  Wt 196 lb (88.905 kg)  LMP 11/06/2014 (Approximate) FHT: 150 BPM  Uterine Size: size equals dates     Assessment:    Pregnancy @ 5022w2d    Plan:    OBGCT: ordered.  Labs, problem list reviewed and updated 2 hr GTT planned Follow up in 2 weeks.

## 2015-05-04 LAB — GLUCOSE TOLERANCE, 2 HOURS W/ 1HR
GLUCOSE: 125 mg/dL (ref 70–170)
Glucose, 2 hour: 93 mg/dL (ref 70–139)
Glucose, Fasting: 74 mg/dL (ref 70–99)

## 2015-05-04 LAB — HIV ANTIBODY (ROUTINE TESTING W REFLEX): HIV: NONREACTIVE

## 2015-05-04 LAB — RPR

## 2015-05-17 ENCOUNTER — Ambulatory Visit (INDEPENDENT_AMBULATORY_CARE_PROVIDER_SITE_OTHER): Payer: Medicaid Other | Admitting: Certified Nurse Midwife

## 2015-05-17 ENCOUNTER — Encounter: Payer: Self-pay | Admitting: Obstetrics

## 2015-05-17 VITALS — BP 115/71 | HR 98 | Temp 97.8°F | Wt 192.0 lb

## 2015-05-17 DIAGNOSIS — Z3482 Encounter for supervision of other normal pregnancy, second trimester: Secondary | ICD-10-CM | POA: Diagnosis not present

## 2015-05-17 LAB — POCT URINALYSIS DIPSTICK
BILIRUBIN UA: NEGATIVE
Glucose, UA: NEGATIVE
KETONES UA: NEGATIVE
Leukocytes, UA: NEGATIVE
NITRITE UA: NEGATIVE
Protein, UA: NEGATIVE
RBC UA: NEGATIVE
SPEC GRAV UA: 1.01
Urobilinogen, UA: NEGATIVE
pH, UA: 7

## 2015-05-17 NOTE — Progress Notes (Signed)
Subjective:    Lauren Conway is a 24 y.o. female being seen today for her obstetrical visit. She is at 2555w2d gestation. Patient reports: no bleeding, no leaking and occasional contractions . Had regular contractions on Monday, did not take anything for them and [facility]; they lasted about 6 hours.  Denies any vaginal bleeding or leaking of fluid.  Educated on the importance of monitoring contractions/cervical exams with preterm contractions that are regular and consistent.  Patient verbalized understanding.   Fetal movement: normal.  Problem List Items Addressed This Visit    None    Visit Diagnoses    Encounter for supervision of other normal pregnancy in second trimester    -  Primary    Relevant Orders    POCT urinalysis dipstick (Completed)      Patient Active Problem List   Diagnosis Date Noted  . Cervical insufficiency during pregnancy in first trimester, antepartum   . Encounter for fetal anatomic survey   . Chlamydia trachomatis infection 06/12/2014  . STD (sexually transmitted disease)    Objective:    BP 115/71 mmHg  Pulse 98  Temp(Src) 97.8 F (36.6 C)  Wt 192 lb (87.091 kg)  LMP 11/06/2014 (Approximate) FHT: 130's BPM  Uterine Size: 29 cm and size equals dates   Cervix:  Long, thick, posterior, outer os 1 cm dilated.  Unchanged from previous assessment.    Assessment:    Pregnancy @ 6855w2d    Plan:    OBGCT: completed and results reviewed. Signs and symptoms of preterm labor: discussed. Labs, problem list reviewed and updated Follow up in 2 weeks.

## 2015-05-17 NOTE — Patient Instructions (Signed)

## 2015-05-21 ENCOUNTER — Inpatient Hospital Stay (HOSPITAL_COMMUNITY)
Admission: AD | Admit: 2015-05-21 | Discharge: 2015-05-21 | Disposition: A | Discharge status: Home / Self Care | Payer: Medicaid Other | Source: Ambulatory Visit | Attending: Obstetrics | Admitting: Obstetrics

## 2015-05-21 ENCOUNTER — Encounter (HOSPITAL_COMMUNITY): Payer: Self-pay

## 2015-05-21 DIAGNOSIS — Z91040 Latex allergy status: Secondary | ICD-10-CM | POA: Diagnosis not present

## 2015-05-21 DIAGNOSIS — J45909 Unspecified asthma, uncomplicated: Secondary | ICD-10-CM | POA: Diagnosis not present

## 2015-05-21 DIAGNOSIS — Z8619 Personal history of other infectious and parasitic diseases: Secondary | ICD-10-CM | POA: Insufficient documentation

## 2015-05-21 DIAGNOSIS — O26893 Other specified pregnancy related conditions, third trimester: Secondary | ICD-10-CM

## 2015-05-21 DIAGNOSIS — O2693 Pregnancy related conditions, unspecified, third trimester: Secondary | ICD-10-CM

## 2015-05-21 DIAGNOSIS — Z79899 Other long term (current) drug therapy: Secondary | ICD-10-CM | POA: Diagnosis not present

## 2015-05-21 DIAGNOSIS — M545 Low back pain, unspecified: Secondary | ICD-10-CM

## 2015-05-21 DIAGNOSIS — Z3A27 27 weeks gestation of pregnancy: Secondary | ICD-10-CM | POA: Insufficient documentation

## 2015-05-21 DIAGNOSIS — R109 Unspecified abdominal pain: Secondary | ICD-10-CM | POA: Diagnosis present

## 2015-05-21 DIAGNOSIS — O99512 Diseases of the respiratory system complicating pregnancy, second trimester: Secondary | ICD-10-CM | POA: Diagnosis not present

## 2015-05-21 DIAGNOSIS — O9989 Other specified diseases and conditions complicating pregnancy, childbirth and the puerperium: Secondary | ICD-10-CM | POA: Diagnosis not present

## 2015-05-21 DIAGNOSIS — Z87891 Personal history of nicotine dependence: Secondary | ICD-10-CM | POA: Insufficient documentation

## 2015-05-21 LAB — URINALYSIS, ROUTINE W REFLEX MICROSCOPIC
Bilirubin Urine: NEGATIVE
Glucose, UA: NEGATIVE mg/dL
HGB URINE DIPSTICK: NEGATIVE
KETONES UR: NEGATIVE mg/dL
LEUKOCYTES UA: NEGATIVE
Nitrite: NEGATIVE
PH: 6.5 (ref 5.0–8.0)
PROTEIN: NEGATIVE mg/dL
Specific Gravity, Urine: <1.005 — ABNORMAL LOW (ref 1.005–1.030)
Urobilinogen, UA: 0.2 mg/dL (ref 0.0–1.0)

## 2015-05-21 LAB — WET PREP, GENITAL
CLUE CELLS WET PREP: NONE SEEN
Trich, Wet Prep: NONE SEEN
YEAST WET PREP: NONE SEEN

## 2015-05-21 NOTE — MAU Note (Signed)
Abd area tight, started at 0130 and low back hurting at 0330 am.  Pelvic pressure also.  Baby not moving as much but started moving better since arrival.  No bleeding. Having clear mucus d/c.

## 2015-05-21 NOTE — MAU Provider Note (Signed)
History     CSN: [id number]  Arrival date and time: 05/21/15 0437   None     Chief Complaint  Patient presents with  . Abdominal Pain  . Back Pain   HPI 24 y.o. G3P1011 at [redacted]w[redacted]d w/ c/o abdominal "tightness" since 1:30 AM and intermittent low back pain since about 3:30 am, not able to quantify frequency of low back pain, not frequent, lasts a minute or less when it comes, states tightness in abdomen is constant, but no abdominal pain. Denies vaginal bleeding, does state that she has had some vaginal discharge over the past week or so. + fetal movement. Pt has h/o term delivery w/ threatned preterm labor during that pregnancy and 8 week SAB. Pt states her pregnancy has been complicated by "possible premature dilation" and that she was followed by MFM for this reason until about 3 weeks ago, however last u/s from MFM on 04/25/15 was normal, cervical length 4.6 cm, closed by u/s, prior u/s showed "prominent endocervical canal" with normal cervical length.   Past Medical History  Diagnosis Date  . History of gonorrhea     12/2008; 09/2010;   . History of chlamydia     12/2008; 03/09/2009  . Asthma     Past Surgical History  Procedure Laterality Date  . No past surgeries      Family History  Problem Relation Age of Onset  . Other Neg Hx   . Hypertension Mother   . Asthma Maternal Grandmother     History  Substance Use Topics  . Smoking status: Former Smoker -- 0.25 packs/day  . Smokeless tobacco: Never Used  . Alcohol Use: No     Comment: Rare    Allergies:  Allergies  Allergen Reactions  . Eggs Or Egg-Derived Products Hives and Swelling  . Latex Itching and Swelling    Prescriptions prior to admission  Medication Sig Dispense Refill Last Dose  . acetaminophen (TYLENOL) 500 MG tablet Take 500 mg by mouth every 6 (six) hours as needed for mild pain.   Past Week at Unknown time  . albuterol (PROVENTIL HFA;VENTOLIN HFA) 108 (90 BASE) MCG/ACT inhaler Inhale 2 puffs into  the lungs every 6 (six) hours as needed for wheezing or shortness of breath. 1 Inhaler 2 05/20/2015 at Unknown time  . cetirizine (ZYRTEC) 10 MG tablet Take 10 mg by mouth daily as needed for allergies.   Past Week at Unknown time  . Cholecalciferol (VITAMIN D) 2000 UNITS tablet Take 2,000 Units by mouth daily.   Past Week at Unknown time  . Prenatal Vit-Fe Fumarate-FA (PRENATAL MULTIVITAMIN) TABS tablet Take 1 tablet by mouth daily at 12 noon.   Past Week at Unknown time  . Budesonide 90 MCG/ACT inhaler Inhale 1-2 puffs into the lungs 2 (two) times daily. 1 each 2 Taking  . Doxylamine-Pyridoxine (DICLEGIS) 10-10 MG TBEC 1 tab in AM, 1 tab mid afternoon 2 tabs at bedtime. Max dose 4 tabs daily. (Patient not taking: Reported on 03/17/2015) 100 tablet 3 Not Taking  . fluconazole (DIFLUCAN) 100 MG tablet Take 1 tablet (100 mg total) by mouth once. Repeat dose in 48-72 hours. (Patient not taking: Reported on 04/25/2015) 3 tablet 0 Not Taking  . fluticasone (FLONASE) 50 MCG/ACT nasal spray Place 1 spray into both nostrils 2 (two) times daily. (Patient not taking: Reported on 05/03/2015) 16 g 2 Not Taking  . guaiFENesin (ROBITUSSIN) 100 MG/5ML liquid Take 5-10 mLs (100-200 mg total) by mouth every 4 (four) hours as  needed for cough. (Patient not taking: Reported on 04/06/2015) 118 mL 0 Not Taking  . hydrocortisone cream 1 % Apply to affected area 2 times daily (Patient not taking: Reported on 03/23/2015) 30 g 1 Not Taking  . montelukast (SINGULAIR) 10 MG tablet Take 1 tablet (10 mg total) by mouth at bedtime. (Patient not taking: Reported on 04/25/2015) 30 tablet 2 Not Taking  . Olopatadine HCl (PATADAY) 0.2 % SOLN Apply 1 drop to eye daily. (Patient not taking: Reported on 04/25/2015) 2.5 mL 2 Not Taking  . ondansetron (ZOFRAN) 8 MG tablet Take 1 tablet (8 mg total) by mouth every 8 (eight) hours as needed for nausea or vomiting. (Patient not taking: Reported on 04/25/2015) 20 tablet 0 Not Taking  . predniSONE  (DELTASONE) 10 MG tablet Take 2 tablets (20 mg total) by mouth 2 (two) times daily with a meal. (Patient not taking: Reported on 04/06/2015) 15 tablet 0 Not Taking    Review of Systems  Constitutional: Negative.   Respiratory: Negative.   Cardiovascular: Negative.   Gastrointestinal: Positive for constipation. Negative for nausea, vomiting, abdominal pain and diarrhea.  Genitourinary: Negative for dysuria, urgency, frequency, hematuria and flank pain.       Negative for vaginal bleeding, cramping/contractions  Musculoskeletal: Positive for back pain.  Neurological: Negative.   Psychiatric/Behavioral: Negative.    Physical Exam   Height  (1.626 m), weight 194 lb (87.998 kg), last menstrual period 11/06/2014.  Physical Exam  Nursing note and vitals reviewed. Constitutional: She is oriented to person, place, and time. She appears well-developed and well-nourished. No distress.  Cardiovascular: Normal rate.   Respiratory: Effort normal.  GI: Soft. There is no tenderness.  Genitourinary: Vaginal discharge (yellowish mucous) found.   Cervix 1 cm ext os/closed internal os/long  large hard stool palpable high in rectum through posterior vaginal wall  Musculoskeletal: Normal range of motion.  Neurological: She is alert and oriented to person, place, and time.  Skin: Skin is warm and dry.  Psychiatric: She has a normal mood and affect.   FHR: 130 bpm, variability: moderate,  accelerations:  Present,  decelerations:  Absent  Contractions: none  MAU Course  Procedures  Results for orders placed or performed during the hospital encounter of 05/21/15 (from the past 24 hour(s))  Urinalysis, Routine w reflex microscopic (not at [facility])     Status: Abnormal   Collection Time: 05/21/15  5:02 AM  Result Value Ref Range   Color, Urine YELLOW YELLOW   APPearance CLEAR CLEAR   Specific Gravity, Urine <1.005 (L) 1.005 - 1.030   pH 6.5 5.0 - 8.0   Glucose, UA NEGATIVE NEGATIVE mg/dL   Hgb  urine dipstick NEGATIVE NEGATIVE   Bilirubin Urine NEGATIVE NEGATIVE   Ketones, ur NEGATIVE NEGATIVE mg/dL   Protein, ur NEGATIVE NEGATIVE mg/dL   Urobilinogen, UA 0.2 0.0 - 1.0 mg/dL   Nitrite NEGATIVE NEGATIVE   Leukocytes, UA NEGATIVE NEGATIVE  Wet prep, genital     Status: Abnormal   Collection Time: 05/21/15  5:36 AM  Result Value Ref Range   Yeast Wet Prep HPF POC NONE SEEN NONE SEEN   Trich, Wet Prep NONE SEEN NONE SEEN   Clue Cells Wet Prep HPF POC NONE SEEN NONE SEEN   WBC, Wet Prep HPF POC FEW (A) NONE SEEN     Assessment and Plan  24 y.o. G3P1011 at [redacted]w[redacted]d w/ low back pain - no evidence of preterm labor or other complication - rev'd normal discomforts of  pregnancy, possibly also s/t ongoing constipation, advised stool softener or miralax OTC, hydration, fiber, return w/ worsening or new symptoms, precautions rev'd, f/u as scheduled or sooner PRN  Lauren Conway 6:16 AM 05/21/2015

## 2015-06-02 ENCOUNTER — Ambulatory Visit (INDEPENDENT_AMBULATORY_CARE_PROVIDER_SITE_OTHER): Payer: Medicaid Other | Admitting: Certified Nurse Midwife

## 2015-06-02 VITALS — BP 125/65 | HR 91 | Temp 97.4°F | Wt 194.0 lb

## 2015-06-02 DIAGNOSIS — Z3483 Encounter for supervision of other normal pregnancy, third trimester: Secondary | ICD-10-CM

## 2015-06-02 DIAGNOSIS — K5901 Slow transit constipation: Secondary | ICD-10-CM

## 2015-06-02 LAB — POCT URINALYSIS DIPSTICK
Bilirubin, UA: NEGATIVE
Blood, UA: NEGATIVE
Glucose, UA: NEGATIVE
Ketones, UA: NEGATIVE
LEUKOCYTES UA: NEGATIVE
NITRITE UA: NEGATIVE
PH UA: 8
PROTEIN UA: NEGATIVE
Spec Grav, UA: 1.015
UROBILINOGEN UA: NEGATIVE

## 2015-06-02 MED ORDER — SENNOSIDES 15 MG PO CHEW: 2.0000 | CHEWABLE_TABLET | Freq: Four times a day (QID) | ORAL | Status: DC | PRN

## 2015-06-02 MED ORDER — GLYCERIN (LAXATIVE) 2 G RE SUPP: 1.0000 | Freq: Once | RECTAL | Status: DC

## 2015-06-02 NOTE — Progress Notes (Signed)
Subjective:    Lauren Conway is a 24 y.o. female being seen today for her obstetrical visit. She is at [redacted]w[redacted]d gestation. Patient reports backache, no bleeding, no cramping, no leaking and occasional contractions.  Is having constipation only able to have 1 BM/week, desires laxatives.  Has tried homeopathic tx for the constipation with no relief. Fetal movement: normal.  Problem List Items Addressed This Visit    None    Visit Diagnoses    Encounter for supervision of other normal pregnancy in third trimester    -  Primary    Relevant Medications    Sennosides (CHOCOLATED LAXATIVE) 15 MG CHEW    glycerin adult 2 G SUPP    Other Relevant Orders    POCT urinalysis dipstick (Completed)    Slow transit constipation        Relevant Medications    Sennosides (CHOCOLATED LAXATIVE) 15 MG CHEW    glycerin adult 2 G SUPP      Patient Active Problem List   Diagnosis Date Noted  . Cervical insufficiency during pregnancy in first trimester, antepartum   . Encounter for fetal anatomic survey   . Chlamydia trachomatis infection 06/12/2014  . STD (sexually transmitted disease)    Objective:    BP 125/65 mmHg  Pulse 91  Temp(Src) 97.4 F (36.3 C)  Wt 194 lb (87.998 kg)  LMP 11/06/2014 (Approximate) FHT:  130's BPM  Uterine Size: size equals dates  Presentation: cephalic     Assessment:    Pregnancy @ [redacted]w[redacted]d weeks   Constipation  Plan:     labs reviewed, problem list updated Consent signed. GBS sent TDAP offered  Rhogam given for RH negative Pediatrician: discussed. Infant feeding: plans to breastfeed. Maternity leave: N/A, in school. Cigarette smoking: never smoked.  Orders Placed This Encounter  Procedures  . POCT urinalysis dipstick   Meds ordered this encounter  Medications  . Sennosides (CHOCOLATED LAXATIVE) 15 MG CHEW    Sig: Chew 2 tablets (30 mg total) by mouth every 6 (six) hours as needed.    Dispense:  120 each    Refill:  0  . glycerin adult 2 G SUPP   Sig: Place 1 suppository rectally once.    Dispense:  12 each    Refill:  0   Follow up in 2 Weeks.

## 2015-06-07 ENCOUNTER — Ambulatory Visit (INDEPENDENT_AMBULATORY_CARE_PROVIDER_SITE_OTHER): Payer: Medicaid Other | Admitting: Certified Nurse Midwife

## 2015-06-07 VITALS — BP 111/66 | HR 97 | Temp 98.4°F | Wt 193.0 lb

## 2015-06-07 DIAGNOSIS — R42 Dizziness and giddiness: Secondary | ICD-10-CM

## 2015-06-07 DIAGNOSIS — Z3483 Encounter for supervision of other normal pregnancy, third trimester: Secondary | ICD-10-CM

## 2015-06-07 DIAGNOSIS — M79629 Pain in unspecified upper arm: Secondary | ICD-10-CM

## 2015-06-07 LAB — GLUCOSE, POCT (MANUAL RESULT ENTRY): POC Glucose: 105 mg/dl — AB (ref 70–99)

## 2015-06-07 LAB — POCT URINALYSIS DIPSTICK
Bilirubin, UA: NEGATIVE
Blood, UA: NEGATIVE
Glucose, UA: NEGATIVE
Ketones, UA: NEGATIVE
Leukocytes, UA: NEGATIVE
Nitrite, UA: NEGATIVE
PH UA: 7.5
Protein, UA: NEGATIVE
Spec Grav, UA: <=1.005
UROBILINOGEN UA: NEGATIVE

## 2015-06-07 NOTE — Progress Notes (Signed)
Subjective:    Lauren Conway is a 24 y.o. female being seen today for her obstetrical visit. She is at [redacted]w[redacted]d gestation. Patient reports nausea, no bleeding, no contractions, no cramping, no leaking and vertigo. Also c/o bilateral axillary excess breast tissue, with the left side tender to touch. Fetal movement: normal.  States that she had drank a [employer] earlier and has not eaten today.    Problem List Items Addressed This Visit    None    Visit Diagnoses    Encounter for supervision of other normal pregnancy in third trimester    -  Primary    Relevant Orders    POCT Glucose (CBG) (Completed)    POCT urinalysis dipstick (Completed)    Axillary tenderness, unspecified laterality        Relevant Orders    US BREAST COMPLETE UNI RIGHT INC AXILLA    US BREAST COMPLETE UNI LEFT INC AXILLA      Patient Active Problem List   Diagnosis Date Noted  . Cervical insufficiency during pregnancy in first trimester, antepartum   . Encounter for fetal anatomic survey   . Chlamydia trachomatis infection 06/12/2014  . STD (sexually transmitted disease)    Objective:    BP 111/66 mmHg  Pulse 97  Temp(Src) 98.4 F (36.9 C)  Wt 193 lb (87.544 kg)  LMP 11/06/2014 (Approximate) FHT:  150's BPM  Uterine Size: size equals dates  Presentation: cephalic   Breasts:  Increased axillary breast tissue bilaterally.  Left side breast axillary tenderness with small lymph nodes palpable.    Assessment:    Pregnancy @ [redacted]w[redacted]d weeks   Low blood pressure  ?dehydration Bilateral extra axillary breast tissue.    Plan:     labs reviewed, problem list updated Encouraged regular eating of small meals/snacks Return to clinic if symptoms persist after eating.   Increase fluids  Orders Placed This Encounter  Procedures  . US BREAST COMPLETE UNI RIGHT INC AXILLA    Standing Status: Future     Number of Occurrences:      Standing Expiration Date: 08/06/2016    Order Specific Question:  Reason for Exam  (SYMPTOM  OR DIAGNOSIS REQUIRED)    Answer:  bilateral axillary tenderness, enlarged lymph nodes left breast, tender to touch currrently pregnant and intends to breast feed.    Order Specific Question:  Preferred imaging location?    Answer:  [facility]  . US BREAST COMPLETE UNI LEFT INC AXILLA    Standing Status: Future     Number of Occurrences:      Standing Expiration Date: 08/06/2016    Order Specific Question:  Reason for Exam (SYMPTOM  OR DIAGNOSIS REQUIRED)    Answer:  bilateral axillary tenderness, enlarged lymph nodes left breast, tender to touch currrently pregnant and intends to breast feed.    Order Specific Question:  Preferred imaging location?    Answer:  [facility]  . POCT Glucose (CBG)  . POCT urinalysis dipstick   No orders of the defined types were placed in this encounter.   Follow up in as previously scheduled.

## 2015-06-08 ENCOUNTER — Telehealth: Payer: Self-pay

## 2015-06-08 NOTE — Telephone Encounter (Signed)
CALLED PATIENT WITH APPT DATE AND TIME FOR BREAST CENTER - 6/29 AT 8:50AM

## 2015-06-14 ENCOUNTER — Other Ambulatory Visit: Payer: Self-pay | Admitting: Certified Nurse Midwife

## 2015-06-14 ENCOUNTER — Ambulatory Visit
Admission: RE | Admit: 2015-06-14 | Discharge: 2015-06-14 | Disposition: A | Discharge status: Home / Self Care | Payer: Medicaid Other | Source: Ambulatory Visit | Attending: Certified Nurse Midwife | Admitting: Certified Nurse Midwife

## 2015-06-14 DIAGNOSIS — M79629 Pain in unspecified upper arm: Secondary | ICD-10-CM

## 2015-06-16 ENCOUNTER — Encounter: Payer: Self-pay | Admitting: Certified Nurse Midwife

## 2015-06-16 ENCOUNTER — Ambulatory Visit (INDEPENDENT_AMBULATORY_CARE_PROVIDER_SITE_OTHER): Payer: Medicaid Other | Admitting: Certified Nurse Midwife

## 2015-06-16 VITALS — BP 112/66 | HR 80 | Temp 97.7°F | Wt 194.0 lb

## 2015-06-16 DIAGNOSIS — Z3483 Encounter for supervision of other normal pregnancy, third trimester: Secondary | ICD-10-CM

## 2015-06-16 LAB — POCT URINALYSIS DIPSTICK
Bilirubin, UA: NEGATIVE
Blood, UA: NEGATIVE
Glucose, UA: NEGATIVE
KETONES UA: NEGATIVE
Leukocytes, UA: NEGATIVE
NITRITE UA: NEGATIVE
PROTEIN UA: NEGATIVE
SPEC GRAV UA: 1.015
UROBILINOGEN UA: NEGATIVE
pH, UA: 6.5

## 2015-06-16 LAB — CBC WITH DIFFERENTIAL/PLATELET
BASOS PCT: 0 % (ref 0–1)
Basophils Absolute: 0 10*3/uL (ref 0.0–0.1)
Eosinophils Absolute: 0.1 10*3/uL (ref 0.0–0.7)
Eosinophils Relative: 1 % (ref 0–5)
HCT: 31.6 % — ABNORMAL LOW (ref 36.0–46.0)
HEMOGLOBIN: 10.5 g/dL — AB (ref 12.0–15.0)
LYMPHS PCT: 25 % (ref 12–46)
Lymphs Abs: 2 10*3/uL (ref 0.7–4.0)
MCH: 29.2 pg (ref 26.0–34.0)
MCHC: 33.2 g/dL (ref 30.0–36.0)
MCV: 88 fL (ref 78.0–100.0)
MPV: 9.4 fL (ref 8.6–12.4)
Monocytes Absolute: 0.6 10*3/uL (ref 0.1–1.0)
Monocytes Relative: 8 % (ref 3–12)
NEUTROS ABS: 5.1 10*3/uL (ref 1.7–7.7)
NEUTROS PCT: 66 % (ref 43–77)
Platelets: 276 10*3/uL (ref 150–400)
RBC: 3.59 MIL/uL — AB (ref 3.87–5.11)
RDW: 13.9 % (ref 11.5–15.5)
WBC: 7.8 10*3/uL (ref 4.0–10.5)

## 2015-06-16 NOTE — Progress Notes (Signed)
Subjective:    Lauren FuellingJatavia Conway is a 24 y.o. female being seen today for her obstetrical visit. She is at 184w4d gestation. Patient reports backache, no bleeding, no contractions, no cramping, no leaking and increased depression symptoms with anxiety; is having dizziness with position changes.  Discussed slow position changes and eating regularly.  Currently has not eaten since last night. . Fetal movement: normal.  Problem List Items Addressed This Visit    None    Visit Diagnoses    Encounter for supervision of other normal pregnancy in third trimester    -  Primary    Relevant Orders    CBC with Differential/Platelet    POCT urinalysis dipstick      Patient Active Problem List   Diagnosis Date Noted  . Cervical insufficiency during pregnancy in first trimester, antepartum   . Encounter for fetal anatomic survey   . Chlamydia trachomatis infection 06/12/2014  . STD (sexually transmitted disease)    Objective:    BP 112/66 mmHg  Pulse 80  Temp(Src) 97.7 F (36.5 C)  Wt 194 lb (87.998 kg)  LMP 11/06/2014 (Approximate) FHT:  135 BPM  Uterine Size: size equals dates  Presentation: cephalic     Assessment:    Pregnancy @ 454w4d weeks   Depression/Anxiety  Plan:  [facility] on Tuesday with [facility] on Tuesday.  If depression symptoms continue start Zoloft.     labs reviewed, problem list updated Consent signed. TDAP offered  Rhogam given for RH negative Pediatrician: discussed. Infant feeding: plans to breastfeed. Maternity leave: N/A. Cigarette smoking: never smoked.  Orders Placed This Encounter  Procedures  . CBC with Differential/Platelet  . POCT urinalysis dipstick   No orders of the defined types were placed in this encounter.   Follow up in 1 Week.   Patient spoke with Erskine SquibbJane for some time about her current situation.  Patient is upset with the FOB not helping with child support, not currently employed, will have 2 little ones at home, etc.  Multiple  current stressors.  Will continue to monitor.

## 2015-06-20 ENCOUNTER — Ambulatory Visit (INDEPENDENT_AMBULATORY_CARE_PROVIDER_SITE_OTHER): Payer: Medicaid Other | Admitting: Certified Nurse Midwife

## 2015-06-20 ENCOUNTER — Encounter: Payer: Medicaid Other | Admitting: Certified Nurse Midwife

## 2015-06-20 VITALS — BP 116/69 | HR 97 | Wt 196.0 lb

## 2015-06-20 DIAGNOSIS — F32A Depression, unspecified: Secondary | ICD-10-CM

## 2015-06-20 DIAGNOSIS — F329 Major depressive disorder, single episode, unspecified: Secondary | ICD-10-CM

## 2015-06-20 DIAGNOSIS — Z3483 Encounter for supervision of other normal pregnancy, third trimester: Secondary | ICD-10-CM | POA: Diagnosis not present

## 2015-06-20 DIAGNOSIS — O99343 Other mental disorders complicating pregnancy, third trimester: Secondary | ICD-10-CM

## 2015-06-20 LAB — POCT URINALYSIS DIPSTICK
BILIRUBIN UA: NEGATIVE
Blood, UA: NEGATIVE
GLUCOSE UA: NEGATIVE
Ketones, UA: NEGATIVE
Leukocytes, UA: NEGATIVE
Nitrite, UA: NEGATIVE
PH UA: 6.5
Protein, UA: NEGATIVE
SPEC GRAV UA: 1.015
Urobilinogen, UA: NEGATIVE

## 2015-06-20 MED ORDER — SERTRALINE HCL 50 MG PO TABS: 50.0000 mg | ORAL_TABLET | Freq: Every day | ORAL | Status: DC

## 2015-06-20 NOTE — Progress Notes (Signed)
Subjective:    Lauren Conway is a 24 y.o. female being seen today for her obstetrical visit. She is at 6448w1d gestation. Patient reports no bleeding, no contractions, no cramping, no leaking and states she is feeling better today with her depression, she spoke with [facility] today.  [facility] information given. . Fetal movement: normal.  Problem List Items Addressed This Visit    None    Visit Diagnoses    Encounter for supervision of other normal pregnancy in third trimester    -  Primary    Relevant Orders    POCT urinalysis dipstick (Completed)    Depression affecting pregnancy in third trimester, antepartum        Relevant Medications    sertraline (ZOLOFT) 50 MG tablet      Patient Active Problem List   Diagnosis Date Noted  . Cervical insufficiency during pregnancy in first trimester, antepartum   . Encounter for fetal anatomic survey   . Chlamydia trachomatis infection 06/12/2014  . STD (sexually transmitted disease)    Objective:    BP 116/69 mmHg  Pulse 97  Wt 196 lb (88.905 kg)  LMP 11/06/2014 (Approximate) FHT:  130's BPM  Uterine Size: size equals dates  Presentation: cephalic     Assessment:    Pregnancy @ 348w1d weeks   Depression  Plan:     labs reviewed, problem list updated Consent signed. TDAP offered  Rhogam given for RH negative Pediatrician: discussed. Infant feeding: plans to breastfeed. Maternity leave: discussed. Cigarette smoking: never smoked. Orders Placed This Encounter  Procedures  . POCT urinalysis dipstick   Meds ordered this encounter  Medications  . sertraline (ZOLOFT) 50 MG tablet    Sig: Take 1 tablet (50 mg total) by mouth daily.    Dispense:  30 tablet    Refill:  2   Follow up in 1 Week.

## 2015-06-27 ENCOUNTER — Ambulatory Visit (INDEPENDENT_AMBULATORY_CARE_PROVIDER_SITE_OTHER): Payer: Medicaid Other | Admitting: Certified Nurse Midwife

## 2015-06-27 VITALS — BP 116/67 | HR 106 | Temp 97.0°F | Wt 196.4 lb

## 2015-06-27 DIAGNOSIS — Z3493 Encounter for supervision of normal pregnancy, unspecified, third trimester: Secondary | ICD-10-CM | POA: Diagnosis not present

## 2015-06-27 LAB — POCT URINALYSIS DIPSTICK
BILIRUBIN UA: NEGATIVE
Blood, UA: NEGATIVE
GLUCOSE UA: NORMAL
Ketones, UA: NEGATIVE
LEUKOCYTES UA: NEGATIVE
Nitrite, UA: NEGATIVE
Protein, UA: NEGATIVE
SPEC GRAV UA: 1.01
UROBILINOGEN UA: NEGATIVE
pH, UA: 7

## 2015-06-28 NOTE — Patient Instructions (Signed)
Depression Depression refers to feeling sad, low, down in the dumps, blue, gloomy, or empty. In general, there are two kinds of depression:  Normal sadness or normal grief. This kind of depression is one that we all feel from time to time after upsetting life experiences, such as the loss of a job or the ending of a relationship. This kind of depression is considered normal, is short lived, and resolves within a few days to 2 weeks. Depression experienced after the loss of a loved one (bereavement) often lasts longer than 2 weeks but normally gets better with time.  Clinical depression. This kind of depression lasts longer than normal sadness or normal grief or interferes with your ability to function at home, at work, and in school. It also interferes with your personal relationships. It affects almost every aspect of your life. Clinical depression is an illness. Symptoms of depression can also be caused by conditions other than those mentioned above, such as:  Physical illness. Some physical illnesses, including underactive thyroid gland (hypothyroidism), severe anemia, specific types of cancer, diabetes, uncontrolled seizures, heart and lung problems, strokes, and chronic pain are commonly associated with symptoms of depression.  Side effects of some prescription medicine. In some people, certain types of medicine can cause symptoms of depression.  Substance abuse. Abuse of alcohol and illicit drugs can cause symptoms of depression. SYMPTOMS Symptoms of normal sadness and normal grief include the following:  Feeling sad or crying for short periods of time.  Not caring about anything (apathy).  Difficulty sleeping or sleeping too much.  No longer able to enjoy the things you used to enjoy.  Desire to be by oneself all the time (social isolation).  Lack of energy or motivation.  Difficulty concentrating or remembering.  Change in appetite or weight.  Restlessness or  agitation. Symptoms of clinical depression include the same symptoms of normal sadness or normal grief and also the following symptoms:  Feeling sad or crying all the time.  Feelings of guilt or worthlessness.  Feelings of hopelessness or helplessness.  Thoughts of suicide or the desire to harm yourself (suicidal ideation).  Loss of touch with reality (psychotic symptoms). Seeing or hearing things that are not real (hallucinations) or having false beliefs about your life or the people around you (delusions and paranoia). DIAGNOSIS  The diagnosis of clinical depression is usually based on how bad the symptoms are and how long they have lasted. Your health care provider will also ask you questions about your medical history and substance use to find out if physical illness, use of prescription medicine, or substance abuse is causing your depression. Your health care provider may also order blood tests. TREATMENT  Often, normal sadness and normal grief do not require treatment. However, sometimes antidepressant medicine is given for bereavement to ease the depressive symptoms until they resolve. The treatment for clinical depression depends on how bad the symptoms are but often includes antidepressant medicine, counseling with a mental health professional, or both. Your health care provider will help to determine what treatment is best for you. Depression caused by physical illness usually goes away with appropriate medical treatment of the illness. If prescription medicine is causing depression, talk with your health care provider about stopping the medicine, decreasing the dose, or changing to another medicine. Depression caused by the abuse of alcohol or illicit drugs goes away when you stop using these substances. Some adults need professional help in order to stop drinking or using drugs. [place]  CARE IF:  You have thoughts about hurting yourself or others.  You lose touch  with reality (have psychotic symptoms).  You are taking medicine for depression and have a serious side effect. FOR MORE INFORMATION  National Alliance on Mental Illness: www.nami.[employer] of Mental Health: http://www.maynard.net/ Document Released: 11/29/2000 Document Revised: 04/18/2014 Document Reviewed: 03/02/2012 [facility] Patient Information 2015 [place], [place]. This information is not intended to replace advice given to you by your health care provider. Make sure you discuss any questions you have with your health care provider. Pain Relief During Labor and Delivery Everyone experiences pain differently, but labor causes severe pain for many women. The amount of pain you experience during labor and delivery depends on your pain tolerance, contraction strength, and your baby's size and position. There are many ways to prepare for and deal with the pain, including:   Taking prenatal classes to learn about labor and delivery. The more informed you are, the less anxious and afraid you may be. This can help lessen the pain.  Taking pain-relieving medicine during labor and delivery.  Learning breathing and relaxation techniques.  Taking a shower or bath.  Getting massaged.  Changing positions.  Placing an ice pack on your back. Discuss your pain control options with your health care provider during your prenatal visits.  WHAT ARE THE TWO TYPES OF PAIN-RELIEVING MEDICINES?  Analgesics. These are medicines that decrease pain without total loss of feeling or muscle movement.  Anesthetics. These are medicines that block all feeling, including pain. There can be minor side effects of both types, such as nausea, trouble concentrating, becoming sleepy, and lowering the heart rate of the baby. However, health care providers are careful to give doses that will not seriously affect the baby.  WHAT ARE THE SPECIFIC TYPES OF ANALGESICS AND ANESTHETICS? Systemic Analgesic Systemic  pain medicines affect your whole body rather than focusing pain relief on the area of your body experiencing pain. This type of medicine is given either through an IV tube in your vein or by a shot (injection) into your muscle. This medicine will lessen your pain but will not stop it completely. It may also make you sleepy, but it will not make you lose consciousness.  Local Anesthetic Local anesthetic isused tonumb a small area of your body. The medicine is injected into the area of nerves that carry feeling to the vagina, vulva, or the area between the vagina and anus (perineum).  General Anesthetic This type of medicine causes you to lose consciousness so you do not feel pain. It is usually used only in emergency situations during labor. It is given through an IV tube or face mask. Paracervical Block A paracervical block is a form of local anesthesia given during labor. Numbing medicine is injected into the right and left sides of the cervix and vagina. It helps to lessen the pain caused by contractions and stretching of the cervix. It may have to be given more than once.  Pudendal Block A pudendal block is another form of local anesthesia. It is used to relieve the pain associated with pushing or stretching of the perineum at the time of delivery. An injection is given deep through the vaginal wall into the pudendal nerve in the pelvis, numbing the perineum.  Epidural Anesthetic An epidural is an injection of numbing medicine given in the lower back and into the epidural space near your spinal cord. The epidural numbs the lower half of your body. You may be  able to move your legs but will not be allowed to walk. Epidurals can be used for labor, delivery, or cesarean deliveries.  To prevent the medicine from wearing off, a small tube (catheter) may be threaded into the epidural space and taped in place to prevent it from slipping out. Medicine can then be given continuously in small doses through  the tube until you deliver. Spinal Block A spinal block is similar to an epidural, but the medicine is injected into the spinal fluid, not the epidural space. A spinal block is only given once. It starts to relieve pain quickly but lasts only 1-2 hours. Spinal blocks can also be used for cesarean deliveries.  Combined Spinal-Epidural Block Combined spinal-epidural blocks combine the benefits of both the spinal and epidural blocks. The spinal part acts quickly to relieve pain and the epidural provides continuous pain relief. Hydrotherapy Immersion in warm water during labor may provide comfort and relaxation. It may also help to lessen pain, the use of anesthesia, and the length of labor. However, immersion in water during the delivery (water birth) may have some risk involved and studies to determine safety and risks are ongoing. If you are a healthy woman who is expecting an uncomplicated birth, talk with your health care provider to see if water birth is an option for you.  Document Released: 03/20/2009 Document Revised: 12/07/2013 Document Reviewed: 04/22/2013 [facility] Patient Information 2015 [place], [place]. This information is not intended to replace advice given to you by your health care provider. Make sure you discuss any questions you have with your health care provider. Natural Childbirth Natural childbirth is going through labor and delivery without any drugs to relieve pain. You also do not use fetal monitors, have a cesarean delivery, or get a sugical cut to enlarge the vaginal opening (episiotomy). With the help of a birthing professional (midwife), you will direct your own labor and delivery as you choose. Many women chose natural childbirth because they feel more in control and in touch with their labor and delivery. They are also concerned about the medications affecting themselves and the baby. Pregnant women with a high risk pregnancy should not attempt natural childbirth. It is  better to deliver the infant in a hospital if an emergency situation arises. Sometimes, the caregiver has to intervene for the health and safety of the mother and infant. TWO TECHNIQUES FOR NATURAL CHILDBIRTH:   The Lamaze method. This method teaches women that having a baby is normal, healthy, and natural. It also teaches the mother to take a neutral position regarding pain medication and anesthesia and to make an informed decision if and when it is right for them.  The Erven Colla (also called husband coached birth). This method teaches the father to be the birth coach and stresses a natural approach. It also encourages exercise and a balanced diet with good nutrition. The exercises teach relaxation and deep breathing techniques. However, there are also classes to prepare the parents for an emergency situation that may occur. METHODS OF DEALING WITH LABOR PAIN AND DELIVERY:  Meditation.  Yoga.  Hypnosis.  Acupuncture.  Massage.  Changing positions (walking, rocking, showering, leaning on birth balls).  Lying in warm water or a jacuzzi.  Find an activity that keeps your mind off of the labor pain.  Listen to soft music.  Visual imagery (focus on a particular object). BEFORE GOING INTO LABOR  Be sure you and your spouse/partner are in agreement to have natural childbirth.  Decide if your  caregiver or a midwife will deliver your baby.  Decide if you will have your baby in the hospital, birthing center, or at home.  If you have children, make plans to have someone to take care of them when you go to the hospital.  Know the distance and the time it takes to go to the delivery center. Make a dry run to be sure.  Have a bag packed with a night gown, bathrobe, and toiletries ready to take when you go into labor.  Keep phone numbers of your family and friends handy if you need to call someone when you go into labor.  Your spouse or partner should go to all the teaching  classes.  Talk with your caregiver about the possibility of a medical emergency and what will happen if that occurs. ADVANTAGES OF NATURAL CHILDBIRTH  You are in control of your labor and delivery.  It is safe.  There are no medications or anesthetics that may affect you and the fetus.  There are no invasive procedures such as an episiotomy.  You and your partner will work together, which can increase your bond.  Meditation, yoga, massage, and breathing exercises can be learned while pregnant and help you when you are in labor and at delivery.  In most delivery centers, the family and friends can be involved in the labor and delivery process. DISADVANTAGES OF NATURAL CHILDBIRTH  You will experience pain during your labor and delivery.  The methods of helping relieve your labor pains may not work for you.  You may feel embarrassed, disappointed, and like a failure if you decide to change your mind during labor and not have natural childbirth. AFTER THE DELIVERY  You will be very tired.  You will be uncomfortable because of your uterus contracting. You will feel soreness around the vagina.  You may feel cold and shaky.This is a natural reaction.  You will be excited, overwhelmed, accomplished, and proud to be a mother. HOME CARE INSTRUCTIONS   Follow the advice and instructions of your caregiver.  Follow the instructions of your natural childbirth instructor (Lamaze or Bradley Method). Document Released: 11/14/2008 Document Revised: 02/24/2012 Document Reviewed: 08/09/2013 [facility] Patient Information 2015 [place], [place]. This information is not intended to replace advice given to you by your health care provider. Make sure you discuss any questions you have with your health care provider. Third Trimester of Pregnancy The third trimester is from week 29 through week 42, months 7 through 9. The third trimester is a time when the fetus is growing rapidly. At the end of the  ninth month, the fetus is about 20 inches in length and weighs 6-10 pounds.  BODY CHANGES Your body goes through many changes during pregnancy. The changes vary from woman to woman.   Your weight will continue to increase. You can expect to gain 25-35 pounds (11-16 kg) by the end of the pregnancy.  You may begin to get stretch marks on your hips, abdomen, and breasts.  You may urinate more often because the fetus is moving lower into your pelvis and pressing on your bladder.  You may develop or continue to have heartburn as a result of your pregnancy.  You may develop constipation because certain hormones are causing the muscles that push waste through your intestines to slow down.  You may develop hemorrhoids or swollen, bulging veins (varicose veins).  You may have pelvic pain because of the weight gain and pregnancy hormones relaxing your joints between the bones  in your pelvis. Backaches may result from overexertion of the muscles supporting your posture.  You may have changes in your hair. These can include thickening of your hair, rapid growth, and changes in texture. Some women also have hair loss during or after pregnancy, or hair that feels dry or thin. Your hair will most likely return to normal after your baby is born.  Your breasts will continue to grow and be tender. A yellow discharge may leak from your breasts called colostrum.  Your belly button may stick out.  You may feel short of breath because of your expanding uterus.  You may notice the fetus "dropping," or moving lower in your abdomen.  You may have a bloody mucus discharge. This usually occurs a few days to a week before labor begins.  Your cervix becomes thin and soft (effaced) near your due date. WHAT TO EXPECT AT YOUR PRENATAL EXAMS  You will have prenatal exams every 2 weeks until week 36. Then, you will have weekly prenatal exams. During a routine prenatal visit:  You will be weighed to make sure you and  the fetus are growing normally.  Your blood pressure is taken.  Your abdomen will be measured to track your baby's growth.  The fetal heartbeat will be listened to.  Any test results from the previous visit will be discussed.  You may have a cervical check near your due date to see if you have effaced. At around 36 weeks, your caregiver will check your cervix. At the same time, your caregiver will also perform a test on the secretions of the vaginal tissue. This test is to determine if a type of bacteria, Group B streptococcus, is present. Your caregiver will explain this further. Your caregiver may ask you:  What your birth plan is.  How you are feeling.  If you are feeling the baby move.  If you have had any abnormal symptoms, such as leaking fluid, bleeding, severe headaches, or abdominal cramping.  If you have any questions. Other tests or screenings that may be performed during your third trimester include:  Blood tests that check for low iron levels (anemia).  Fetal testing to check the health, activity level, and growth of the fetus. Testing is done if you have certain medical conditions or if there are problems during the pregnancy. FALSE LABOR You may feel small, irregular contractions that eventually go away. These are called Braxton Hicks contractions, or false labor. Contractions may last for hours, days, or even weeks before true labor sets in. If contractions come at regular intervals, intensify, or become painful, it is best to be seen by your caregiver.  SIGNS OF LABOR   Menstrual-like cramps.  Contractions that are 5 minutes apart or less.  Contractions that start on the top of the uterus and spread down to the lower abdomen and back.  A sense of increased pelvic pressure or back pain.  A watery or bloody mucus discharge that comes from the vagina. If you have any of these signs before the 37th week of pregnancy, call your caregiver right away. You need to go  to the hospital to get checked immediately. HOME CARE INSTRUCTIONS   Avoid all smoking, herbs, alcohol, and unprescribed drugs. These chemicals affect the formation and growth of the baby.  Follow your caregiver's instructions regarding medicine use. There are medicines that are either safe or unsafe to take during pregnancy.  Exercise only as directed by your caregiver. Experiencing uterine cramps is a good  sign to stop exercising.  Continue to eat regular, healthy meals.  Wear a good support bra for breast tenderness.  Do not use hot tubs, steam rooms, or saunas.  Wear your seat belt at all times when driving.  Avoid raw meat, uncooked cheese, cat litter boxes, and soil used by cats. These carry germs that can cause birth defects in the baby.  Take your prenatal vitamins.  Try taking a stool softener (if your caregiver approves) if you develop constipation. Eat more high-fiber foods, such as fresh vegetables or fruit and whole grains. Drink plenty of fluids to keep your urine clear or pale yellow.  Take warm sitz baths to soothe any pain or discomfort caused by hemorrhoids. Use hemorrhoid cream if your caregiver approves.  If you develop varicose veins, wear support hose. Elevate your feet for 15 minutes, 3-4 times a day. Limit salt in your diet.  Avoid heavy lifting, wear low heal shoes, and practice good posture.  Rest a lot with your legs elevated if you have leg cramps or low back pain.  Visit your dentist if you have not gone during your pregnancy. Use a soft toothbrush to brush your teeth and be gentle when you floss.  A sexual relationship may be continued unless your caregiver directs you otherwise.  Do not travel far distances unless it is absolutely necessary and only with the approval of your caregiver.  Take prenatal classes to understand, practice, and ask questions about the labor and delivery.  Make a trial run to the hospital.  Pack your hospital  bag.  Prepare the baby's nursery.  Continue to go to all your prenatal visits as directed by your caregiver. SEEK MEDICAL CARE IF:  You are unsure if you are in labor or if your water has broken.  You have dizziness.  You have mild pelvic cramps, pelvic pressure, or nagging pain in your abdominal area.  You have persistent nausea, vomiting, or diarrhea.  You have a bad smelling vaginal discharge.  You have pain with urination. SEEK IMMEDIATE MEDICAL CARE IF:   You have a fever.  You are leaking fluid from your vagina.  You have spotting or bleeding from your vagina.  You have severe abdominal cramping or pain.  You have rapid weight loss or gain.  You have shortness of breath with chest pain.  You notice sudden or extreme swelling of your face, hands, ankles, feet, or legs.  You have not felt your baby move in over an hour.  You have severe headaches that do not go away with medicine.  You have vision changes. Document Released: 11/26/2001 Document Revised: 12/07/2013 Document Reviewed: 02/02/2013 [facility] Patient Information 2015 [place]. This information is not intended to replace advice given to you by your health care provider. Make sure you discuss any questions you have with your health care provider. Third Trimester of Pregnancy The third trimester is from week 29 through week 42, months 7 through 9. The third trimester is a time when the fetus is growing rapidly. At the end of the ninth month, the fetus is about 20 inches in length and weighs 6-10 pounds.  BODY CHANGES Your body goes through many changes during pregnancy. The changes vary from woman to woman.   Your weight will continue to increase. You can expect to gain 25-35 pounds (11-16 kg) by the end of the pregnancy.  You may begin to get stretch marks on your hips, abdomen, and breasts.  You may urinate more often  because the fetus is moving lower into your pelvis and pressing on your  bladder.  You may develop or continue to have heartburn as a result of your pregnancy.  You may develop constipation because certain hormones are causing the muscles that push waste through your intestines to slow down.  You may develop hemorrhoids or swollen, bulging veins (varicose veins).  You may have pelvic pain because of the weight gain and pregnancy hormones relaxing your joints between the bones in your pelvis. Backaches may result from overexertion of the muscles supporting your posture.  You may have changes in your hair. These can include thickening of your hair, rapid growth, and changes in texture. Some women also have hair loss during or after pregnancy, or hair that feels dry or thin. Your hair will most likely return to normal after your baby is born.  Your breasts will continue to grow and be tender. A yellow discharge may leak from your breasts called colostrum.  Your belly button may stick out.  You may feel short of breath because of your expanding uterus.  You may notice the fetus "dropping," or moving lower in your abdomen.  You may have a bloody mucus discharge. This usually occurs a few days to a week before labor begins.  Your cervix becomes thin and soft (effaced) near your due date. WHAT TO EXPECT AT YOUR PRENATAL EXAMS  You will have prenatal exams every 2 weeks until week 36. Then, you will have weekly prenatal exams. During a routine prenatal visit:  You will be weighed to make sure you and the fetus are growing normally.  Your blood pressure is taken.  Your abdomen will be measured to track your baby's growth.  The fetal heartbeat will be listened to.  Any test results from the previous visit will be discussed.  You may have a cervical check near your due date to see if you have effaced. At around 36 weeks, your caregiver will check your cervix. At the same time, your caregiver will also perform a test on the secretions of the vaginal tissue. This  test is to determine if a type of bacteria, Group B streptococcus, is present. Your caregiver will explain this further. Your caregiver may ask you:  What your birth plan is.  How you are feeling.  If you are feeling the baby move.  If you have had any abnormal symptoms, such as leaking fluid, bleeding, severe headaches, or abdominal cramping.  If you have any questions. Other tests or screenings that may be performed during your third trimester include:  Blood tests that check for low iron levels (anemia).  Fetal testing to check the health, activity level, and growth of the fetus. Testing is done if you have certain medical conditions or if there are problems during the pregnancy. FALSE LABOR You may feel small, irregular contractions that eventually go away. These are called Braxton Hicks contractions, or false labor. Contractions may last for hours, days, or even weeks before true labor sets in. If contractions come at regular intervals, intensify, or become painful, it is best to be seen by your caregiver.  SIGNS OF LABOR   Menstrual-like cramps.  Contractions that are 5 minutes apart or less.  Contractions that start on the top of the uterus and spread down to the lower abdomen and back.  A sense of increased pelvic pressure or back pain.  A watery or bloody mucus discharge that comes from the vagina. If you have any of these  signs before the 37th week of pregnancy, call your caregiver right away. You need to go to the hospital to get checked immediately. HOME CARE INSTRUCTIONS   Avoid all smoking, herbs, alcohol, and unprescribed drugs. These chemicals affect the formation and growth of the baby.  Follow your caregiver's instructions regarding medicine use. There are medicines that are either safe or unsafe to take during pregnancy.  Exercise only as directed by your caregiver. Experiencing uterine cramps is a good sign to stop exercising.  Continue to eat regular,  healthy meals.  Wear a good support bra for breast tenderness.  Do not use hot tubs, steam rooms, or saunas.  Wear your seat belt at all times when driving.  Avoid raw meat, uncooked cheese, cat litter boxes, and soil used by cats. These carry germs that can cause birth defects in the baby.  Take your prenatal vitamins.  Try taking a stool softener (if your caregiver approves) if you develop constipation. Eat more high-fiber foods, such as fresh vegetables or fruit and whole grains. Drink plenty of fluids to keep your urine clear or pale yellow.  Take warm sitz baths to soothe any pain or discomfort caused by hemorrhoids. Use hemorrhoid cream if your caregiver approves.  If you develop varicose veins, wear support hose. Elevate your feet for 15 minutes, 3-4 times a day. Limit salt in your diet.  Avoid heavy lifting, wear low heal shoes, and practice good posture.  Rest a lot with your legs elevated if you have leg cramps or low back pain.  Visit your dentist if you have not gone during your pregnancy. Use a soft toothbrush to brush your teeth and be gentle when you floss.  A sexual relationship may be continued unless your caregiver directs you otherwise.  Do not travel far distances unless it is absolutely necessary and only with the approval of your caregiver.  Take prenatal classes to understand, practice, and ask questions about the labor and delivery.  Make a trial run to the hospital.  Pack your hospital bag.  Prepare the baby's nursery.  Continue to go to all your prenatal visits as directed by your caregiver. SEEK MEDICAL CARE IF:  You are unsure if you are in labor or if your water has broken.  You have dizziness.  You have mild pelvic cramps, pelvic pressure, or nagging pain in your abdominal area.  You have persistent nausea, vomiting, or diarrhea.  You have a bad smelling vaginal discharge.  You have pain with urination. SEEK IMMEDIATE MEDICAL CARE IF:    You have a fever.  You are leaking fluid from your vagina.  You have spotting or bleeding from your vagina.  You have severe abdominal cramping or pain.  You have rapid weight loss or gain.  You have shortness of breath with chest pain.  You notice sudden or extreme swelling of your face, hands, ankles, feet, or legs.  You have not felt your baby move in over an hour.  You have severe headaches that do not go away with medicine.  You have vision changes. Document Released: 11/26/2001 Document Revised: 12/07/2013 Document Reviewed: 02/02/2013 [facility] Patient Information 2015 [place], [place]. This information is not intended to replace advice given to you by your health care provider. Make sure you discuss any questions you have with your health care provider.

## 2015-06-28 NOTE — Progress Notes (Signed)
Subjective:    Lauren Conway is a 24 y.o. female being seen today for her obstetrical visit. She is at 6638w2d gestation. Patient reports no bleeding, no contractions, no cramping, no leaking and states that she is doing better, not as depressed.  Has not started the Zoloft.  States that counseling is helping, however missed her appointment today for counseling.  States that she is eating more. Fetal movement: normal.  Problem List Items Addressed This Visit    None    Visit Diagnoses    Prenatal care, third trimester    -  Primary    Relevant Orders    POCT urinalysis dipstick (Completed)      Patient Active Problem List   Diagnosis Date Noted  . Cervical insufficiency during pregnancy in first trimester, antepartum   . Encounter for fetal anatomic survey   . Chlamydia trachomatis infection 06/12/2014  . STD (sexually transmitted disease)    Objective:    BP 116/67 mmHg  Pulse 106  Temp(Src) 97 F (36.1 C)  Wt 196 lb 6.4 oz (89.086 kg)  LMP 11/06/2014 (Approximate) FHT:  135 BPM  Uterine Size: size equals dates  Presentation: cephalic     Assessment:    Pregnancy @ 1838w2d weeks   Plan:     labs reviewed, problem list updated Consent signed. GBS sent TDAP offered  Rhogam given for RH negative Pediatrician: discussed. Infant feeding: plans to breastfeed. Maternity leave: N/A. Cigarette smoking: never smoked. Orders Placed This Encounter  Procedures  . POCT urinalysis dipstick   No orders of the defined types were placed in this encounter.   Follow up in 2 Weeks.

## 2015-07-09 ENCOUNTER — Encounter (HOSPITAL_COMMUNITY): Payer: Self-pay

## 2015-07-09 ENCOUNTER — Inpatient Hospital Stay (HOSPITAL_COMMUNITY)
Admission: AD | Admit: 2015-07-09 | Discharge: 2015-07-09 | Disposition: A | Discharge status: Home / Self Care | Payer: Medicaid Other | Source: Ambulatory Visit | Attending: Obstetrics | Admitting: Obstetrics

## 2015-07-09 DIAGNOSIS — T43225A Adverse effect of selective serotonin reuptake inhibitors, initial encounter: Secondary | ICD-10-CM | POA: Insufficient documentation

## 2015-07-09 DIAGNOSIS — Z3A34 34 weeks gestation of pregnancy: Secondary | ICD-10-CM | POA: Insufficient documentation

## 2015-07-09 DIAGNOSIS — O368130 Decreased fetal movements, third trimester, not applicable or unspecified: Secondary | ICD-10-CM | POA: Insufficient documentation

## 2015-07-09 DIAGNOSIS — Z87891 Personal history of nicotine dependence: Secondary | ICD-10-CM | POA: Diagnosis not present

## 2015-07-09 DIAGNOSIS — O9A213 Injury, poisoning and certain other consequences of external causes complicating pregnancy, third trimester: Secondary | ICD-10-CM | POA: Diagnosis not present

## 2015-07-09 DIAGNOSIS — T50905A Adverse effect of unspecified drugs, medicaments and biological substances, initial encounter: Secondary | ICD-10-CM

## 2015-07-09 HISTORY — DX: Anxiety disorder, unspecified: F41.9

## 2015-07-09 HISTORY — DX: Depression, unspecified: F32.A

## 2015-07-09 HISTORY — DX: Major depressive disorder, single episode, unspecified: F32.9

## 2015-07-09 LAB — URINE MICROSCOPIC-ADD ON

## 2015-07-09 LAB — URINALYSIS, ROUTINE W REFLEX MICROSCOPIC
Bilirubin Urine: NEGATIVE
Glucose, UA: NEGATIVE mg/dL
HGB URINE DIPSTICK: NEGATIVE
Ketones, ur: 15 mg/dL — AB
Nitrite: NEGATIVE
PH: 6.5 (ref 5.0–8.0)
Protein, ur: NEGATIVE mg/dL
SPECIFIC GRAVITY, URINE: 1.01 (ref 1.005–1.030)
Urobilinogen, UA: 0.2 mg/dL (ref 0.0–1.0)

## 2015-07-09 NOTE — Discharge Instructions (Signed)
Third Trimester of Pregnancy The third trimester is from week 29 through week 42, months 7 through 9. The third trimester is a time when the fetus is growing rapidly. At the end of the ninth month, the fetus is about 20 inches in length and weighs 6-10 pounds.  BODY CHANGES Your body goes through many changes during pregnancy. The changes vary from woman to woman.   Your weight will continue to increase. You can expect to gain 25-35 pounds (11-16 kg) by the end of the pregnancy.  You may begin to get stretch marks on your hips, abdomen, and breasts.  You may urinate more often because the fetus is moving lower into your pelvis and pressing on your bladder.  You may develop or continue to have heartburn as a result of your pregnancy.  You may develop constipation because certain hormones are causing the muscles that push waste through your intestines to slow down.  You may develop hemorrhoids or swollen, bulging veins (varicose veins).  You may have pelvic pain because of the weight gain and pregnancy hormones relaxing your joints between the bones in your pelvis. Backaches may result from overexertion of the muscles supporting your posture.  You may have changes in your hair. These can include thickening of your hair, rapid growth, and changes in texture. Some women also have hair loss during or after pregnancy, or hair that feels dry or thin. Your hair will most likely return to normal after your baby is born.  Your breasts will continue to grow and be tender. A yellow discharge may leak from your breasts called colostrum.  Your belly button may stick out.  You may feel short of breath because of your expanding uterus.  You may notice the fetus "dropping," or moving lower in your abdomen.  You may have a bloody mucus discharge. This usually occurs a few days to a week before labor begins.  Your cervix becomes thin and soft (effaced) near your due date. WHAT TO EXPECT AT YOUR PRENATAL  EXAMS  You will have prenatal exams every 2 weeks until week 36. Then, you will have weekly prenatal exams. During a routine prenatal visit:  You will be weighed to make sure you and the fetus are growing normally.  Your blood pressure is taken.  Your abdomen will be measured to track your baby's growth.  The fetal heartbeat will be listened to.  Any test results from the previous visit will be discussed.  You may have a cervical check near your due date to see if you have effaced. At around 36 weeks, your caregiver will check your cervix. At the same time, your caregiver will also perform a test on the secretions of the vaginal tissue. This test is to determine if a type of bacteria, Group B streptococcus, is present. Your caregiver will explain this further. Your caregiver may ask you:  What your birth plan is.  How you are feeling.  If you are feeling the baby move.  If you have had any abnormal symptoms, such as leaking fluid, bleeding, severe headaches, or abdominal cramping.  If you have any questions. Other tests or screenings that may be performed during your third trimester include:  Blood tests that check for low iron levels (anemia).  Fetal testing to check the health, activity level, and growth of the fetus. Testing is done if you have certain medical conditions or if there are problems during the pregnancy. FALSE LABOR You may feel small, irregular contractions that   eventually go away. These are called Braxton Hicks contractions, or false labor. Contractions may last for hours, days, or even weeks before true labor sets in. If contractions come at regular intervals, intensify, or become painful, it is best to be seen by your caregiver.  SIGNS OF LABOR   Menstrual-like cramps.  Contractions that are 5 minutes apart or less.  Contractions that start on the top of the uterus and spread down to the lower abdomen and back.  A sense of increased pelvic pressure or back  pain.  A watery or bloody mucus discharge that comes from the vagina. If you have any of these signs before the 37th week of pregnancy, call your caregiver right away. You need to go to the hospital to get checked immediately. HOME CARE INSTRUCTIONS   Avoid all smoking, herbs, alcohol, and unprescribed drugs. These chemicals affect the formation and growth of the baby.  Follow your caregiver's instructions regarding medicine use. There are medicines that are either safe or unsafe to take during pregnancy.  Exercise only as directed by your caregiver. Experiencing uterine cramps is a good sign to stop exercising.  Continue to eat regular, healthy meals.  Wear a good support bra for breast tenderness.  Do not use hot tubs, steam rooms, or saunas.  Wear your seat belt at all times when driving.  Avoid raw meat, uncooked cheese, cat litter boxes, and soil used by cats. These carry germs that can cause birth defects in the baby.  Take your prenatal vitamins.  Try taking a stool softener (if your caregiver approves) if you develop constipation. Eat more high-fiber foods, such as fresh vegetables or fruit and whole grains. Drink plenty of fluids to keep your urine clear or pale yellow.  Take warm sitz baths to soothe any pain or discomfort caused by hemorrhoids. Use hemorrhoid cream if your caregiver approves.  If you develop varicose veins, wear support hose. Elevate your feet for 15 minutes, 3-4 times a day. Limit salt in your diet.  Avoid heavy lifting, wear low heal shoes, and practice good posture.  Rest a lot with your legs elevated if you have leg cramps or low back pain.  Visit your dentist if you have not gone during your pregnancy. Use a soft toothbrush to brush your teeth and be gentle when you floss.  A sexual relationship may be continued unless your caregiver directs you otherwise.  Do not travel far distances unless it is absolutely necessary and only with the approval  of your caregiver.  Take prenatal classes to understand, practice, and ask questions about the labor and delivery.  Make a trial run to the hospital.  Pack your hospital bag.  Prepare the baby's nursery.  Continue to go to all your prenatal visits as directed by your caregiver. SEEK MEDICAL CARE IF:  You are unsure if you are in labor or if your water has broken.  You have dizziness.  You have mild pelvic cramps, pelvic pressure, or nagging pain in your abdominal area.  You have persistent nausea, vomiting, or diarrhea.  You have a bad smelling vaginal discharge.  You have pain with urination. SEEK IMMEDIATE MEDICAL CARE IF:   You have a fever.  You are leaking fluid from your vagina.  You have spotting or bleeding from your vagina.  You have severe abdominal cramping or pain.  You have rapid weight loss or gain.  You have shortness of breath with chest pain.  You notice sudden or extreme swelling   of your face, hands, ankles, feet, or legs.  You have not felt your baby move in over an hour.  You have severe headaches that do not go away with medicine.  You have vision changes. Document Released: 11/26/2001 Document Revised: 12/07/2013 Document Reviewed: 02/02/2013 [employer] Patient Information 2015 [employer]. This information is not intended to replace advice given to you by your health care provider. Make sure you discuss any questions you have with your health care provider.  

## 2015-07-09 NOTE — MAU Provider Note (Signed)
History     CSN: [id number]  Arrival date and time: 07/09/15 9147   First Provider Initiated Contact with Patient 07/09/15 [phone]      Chief Complaint  Patient presents with  . Anxiety  . Decreased Fetal Movement   HPI Lauren Conway 24 y.o. G3P1011  presents to MAU complaining of feeling weird since taking Zoloft one day one time at 6pm yesterday.  She has been feeling weird and just not right since then.  She cannot describe it.  It is just a terrible feeling and she'd rather be anxious.  She has an appt on 7/27 for routine PNC.  She is now feeling baby move.  She denies vaginal discharge, LOF, vaginal bleeding, dysuria.   OB History    Gravida Para Term Preterm AB TAB SAB Ectopic Multiple Living   Past Medical History  Diagnosis Date  . History of gonorrhea     12/2008; 09/2010;   . History of chlamydia     12/2008; 03/09/2009  . Asthma   . Anxiety   . Depression     Past Surgical History  Procedure Laterality Date  . No past surgeries      Family History  Problem Relation Age of Onset  . Other Neg Hx   . Hypertension Mother   . Asthma Maternal Grandmother     History  Substance Use Topics  . Smoking status: Former Smoker -- 0.25 packs/day  . Smokeless tobacco: Never Used  . Alcohol Use: No     Comment: Rare    Allergies:  Allergies  Allergen Reactions  . Eggs Or Egg-Derived Products Hives and Swelling  . Latex Itching and Swelling    Prescriptions prior to admission  Medication Sig Dispense Refill Last Dose  . acetaminophen (TYLENOL) 500 MG tablet Take 500 mg by mouth every 6 (six) hours as needed for mild pain.   Past Week at Unknown time  . albuterol (PROVENTIL HFA;VENTOLIN HFA) 108 (90 BASE) MCG/ACT inhaler Inhale 2 puffs into the lungs every 6 (six) hours as needed for wheezing or shortness of breath. 1 Inhaler 2 Past Week at Unknown time  . Prenatal Vit-Fe Fumarate-FA (PRENATAL MULTIVITAMIN) TABS tablet Take 1 tablet  by mouth daily at 12 noon.   Past Month at Unknown time  . sertraline (ZOLOFT) 50 MG tablet Take 1 tablet (50 mg total) by mouth daily. 30 tablet 2 07/08/2015 at Unknown time  . cetirizine (ZYRTEC) 10 MG tablet Take 10 mg by mouth daily as needed for allergies.   Taking  . Cholecalciferol (VITAMIN D) 2000 UNITS tablet Take 2,000 Units by mouth daily.   Taking  . glycerin adult 2 G SUPP Place 1 suppository rectally once. (Patient not taking: Reported on 06/16/2015) 12 each 0 Not Taking  . Sennosides (CHOCOLATED LAXATIVE) 15 MG CHEW Chew 2 tablets (30 mg total) by mouth every 6 (six) hours as needed. (Patient not taking: Reported on 06/16/2015) 120 each 0 Not Taking    ROS Pertinent ROS in HPI.  All other systems are negative.   Physical Exam   Blood pressure 119/67, pulse 105, temperature 98.1 F (36.7 C), temperature source Oral, resp. rate 16, weight 198 lb 4 oz (89.926 kg), last menstrual period 11/06/2014.  Physical Exam  Constitutional: She is oriented to person, place, and time. She appears well-developed and well-nourished. No distress.  HENT:  Head: Normocephalic and atraumatic.  Eyes: EOM  are normal.  Neck: Normal range of motion.  Cardiovascular: Normal rate.   Respiratory: No respiratory distress.  GI: Soft.  Musculoskeletal: Normal range of motion.  Neurological: She is alert and oriented to person, place, and time.  Skin: Skin is warm and dry.  Psychiatric: She has a normal mood and affect.    MAU Course  Procedures  MDM NST reactive Baby moving well  Assessment and Plan  A:  1. Medication adverse effect, initial encounter   2. Decreased fetal movement, third trimester, not applicable or unspecified fetus    P: Discharge to home Discuss Zoloft use with [facility] provider Labor precautions discussed Fetal well-being reassuring with reactive tracing F/u in clinic as scheduled later this week Patient may return to MAU as needed or if her condition were to change or  worsen   Bertram Denver 07/09/2015, 6:48 AM

## 2015-07-09 NOTE — MAU Note (Signed)
Was prescribed Zoloft and took for the first time yesterday.  Hard to get to sleep and baby wasn't moving as much as usual.  Just felt weird and tossed and turned.  Worried about taking Zoloft while pregnant.  Baby was pushing down.   No leaking fluid and no bleeding.

## 2015-07-12 ENCOUNTER — Encounter: Payer: Medicaid Other | Admitting: Certified Nurse Midwife

## 2015-07-12 ENCOUNTER — Ambulatory Visit (INDEPENDENT_AMBULATORY_CARE_PROVIDER_SITE_OTHER): Payer: Medicaid Other | Admitting: Certified Nurse Midwife

## 2015-07-12 VITALS — BP 123/76 | HR 99 | Wt 194.6 lb

## 2015-07-12 DIAGNOSIS — F418 Other specified anxiety disorders: Secondary | ICD-10-CM

## 2015-07-12 DIAGNOSIS — Z3483 Encounter for supervision of other normal pregnancy, third trimester: Secondary | ICD-10-CM | POA: Diagnosis not present

## 2015-07-12 DIAGNOSIS — F419 Anxiety disorder, unspecified: Secondary | ICD-10-CM

## 2015-07-12 DIAGNOSIS — F329 Major depressive disorder, single episode, unspecified: Secondary | ICD-10-CM

## 2015-07-12 DIAGNOSIS — F32A Depression, unspecified: Secondary | ICD-10-CM

## 2015-07-12 LAB — POCT URINALYSIS DIPSTICK
Bilirubin, UA: NEGATIVE
Blood, UA: NEGATIVE
Glucose, UA: NEGATIVE
Ketones, UA: NEGATIVE
NITRITE UA: NEGATIVE
Spec Grav, UA: 1.01
UROBILINOGEN UA: NEGATIVE
pH, UA: 6.5

## 2015-07-12 MED ORDER — BUPROPION HCL ER (SR) 100 MG PO TB12: 100.0000 mg | ORAL_TABLET | Freq: Two times a day (BID) | ORAL | Status: DC

## 2015-07-12 NOTE — Progress Notes (Signed)
Subjective:    Lauren Conway is a 24 y.o. female being seen today for her obstetrical visit. She is at [redacted]w[redacted]d gestation. Patient reports no bleeding, no contractions, no cramping, no leaking and had been to MAU for reaction to Zoloft over the weekend.  Has not taken Zoloft since.  Had a manic reaction where she could not focus or sleep.. Fetal movement: normal.  Problem List Items Addressed This Visit    None    Visit Diagnoses    Encounter for supervision of other normal pregnancy in third trimester    -  Primary    Relevant Orders    POCT urinalysis dipstick (Completed)    Strep B DNA probe    SureSwab, Vaginosis/Vaginitis Plus    Anxiety and depression        Relevant Medications    buPROPion (WELLBUTRIN SR) 100 MG 12 hr tablet      Patient Active Problem List   Diagnosis Date Noted  . Cervical insufficiency during pregnancy in first trimester, antepartum   . Encounter for fetal anatomic survey   . Chlamydia trachomatis infection 06/12/2014  . STD (sexually transmitted disease)    Objective:    BP 123/76 mmHg  Pulse 99  Wt 194 lb 9.6 oz (88.27 kg)  LMP 11/06/2014 (Approximate) FHT:  135 BPM  Uterine Size: size equals dates  Presentation: cephalic   Cervix:  Long, thick closed and posterior.   Assessment:    Pregnancy @ [redacted]w[redacted]d weeks   Plan:     labs reviewed, problem list updated Consent signed. Continue counseling.  Changed antidepressant to Wellbutrin.  GBS sent TDAP offered  Rhogam given for RH negative Pediatrician: discussed. Infant feeding: plans to breastfeed. Maternity leave: N/A. Cigarette smoking: never smoked. Orders Placed This Encounter  Procedures  . Strep B DNA probe  . SureSwab, Vaginosis/Vaginitis Plus  . POCT urinalysis dipstick   Meds ordered this encounter  Medications  . buPROPion (WELLBUTRIN SR) 100 MG 12 hr tablet    Sig: Take 1 tablet (100 mg total) by mouth 2 (two) times daily.    Dispense:  30 tablet    Refill:  12   Follow  up in 1 Week.

## 2015-07-13 LAB — STREP B DNA PROBE: GBSP: NOT DETECTED

## 2015-07-15 LAB — SURESWAB, VAGINOSIS/VAGINITIS PLUS
Atopobium vaginae: NOT DETECTED Log (cells/mL)
BV CATEGORY: UNDETERMINED — AB
C. GLABRATA, DNA: NOT DETECTED
C. albicans, DNA: NOT DETECTED
C. parapsilosis, DNA: NOT DETECTED
C. trachomatis RNA, TMA: NOT DETECTED
C. tropicalis, DNA: NOT DETECTED
Gardnerella vaginalis: 6.3 Log (cells/mL)
LACTOBACILLUS SPECIES: 7.5 Log (cells/mL)
MEGASPHAERA SPECIES: NOT DETECTED Log (cells/mL)
N. gonorrhoeae RNA, TMA: NOT DETECTED
T. vaginalis RNA, QL TMA: NOT DETECTED

## 2015-07-18 ENCOUNTER — Other Ambulatory Visit: Payer: Self-pay | Admitting: Certified Nurse Midwife

## 2015-07-18 DIAGNOSIS — B9689 Other specified bacterial agents as the cause of diseases classified elsewhere: Secondary | ICD-10-CM

## 2015-07-18 DIAGNOSIS — N760 Acute vaginitis: Principal | ICD-10-CM

## 2015-07-18 MED ORDER — METRONIDAZOLE 0.75 % VA GEL: 1.0000 | Freq: Two times a day (BID) | VAGINAL | Status: DC

## 2015-07-19 ENCOUNTER — Ambulatory Visit (INDEPENDENT_AMBULATORY_CARE_PROVIDER_SITE_OTHER): Payer: Medicaid Other | Admitting: Certified Nurse Midwife

## 2015-07-19 VITALS — BP 118/73 | HR 101 | Temp 98.3°F | Wt 203.0 lb

## 2015-07-19 DIAGNOSIS — Z3483 Encounter for supervision of other normal pregnancy, third trimester: Secondary | ICD-10-CM

## 2015-07-19 NOTE — Progress Notes (Signed)
Subjective:    Gilberto Streck is a 24 y.o. female being seen today for her obstetrical visit. She is at [redacted]w[redacted]d gestation. Patient reports no complaints. Fetal movement: normal.  Has not started Wellbutrin.    Problem List Items Addressed This Visit    None    Visit Diagnoses    Encounter for supervision of other normal pregnancy in third trimester    -  Primary    Relevant Orders    POCT urinalysis dipstick      Patient Active Problem List   Diagnosis Date Noted  . Cervical insufficiency during pregnancy in first trimester, antepartum   . Encounter for fetal anatomic survey   . Chlamydia trachomatis infection 06/12/2014  . STD (sexually transmitted disease)    Objective:    BP 118/73 mmHg  Pulse 101  Temp(Src) 98.3 F (36.8 C)  Wt 203 lb (92.08 kg)  LMP 11/06/2014 (Approximate) FHT:  145 BPM  Uterine Size: 39 cm and size greater than dates  Presentation: cephalic     Assessment:    Pregnancy @ [redacted]w[redacted]d weeks   Plan:     labs reviewed, problem list updated Consent signed. GBS sent TDAP offered  Rhogam given for RH negative Pediatrician: discussed. Infant feeding: plans to breastfeed. Maternity leave: discussed. Cigarette smoking: never smoked. Orders Placed This Encounter  Procedures  . POCT urinalysis dipstick   No orders of the defined types were placed in this encounter.   Follow up in 1 Week.

## 2015-07-25 ENCOUNTER — Ambulatory Visit (INDEPENDENT_AMBULATORY_CARE_PROVIDER_SITE_OTHER): Payer: Medicaid Other | Admitting: Certified Nurse Midwife

## 2015-07-25 DIAGNOSIS — A499 Bacterial infection, unspecified: Secondary | ICD-10-CM

## 2015-07-25 DIAGNOSIS — Z3483 Encounter for supervision of other normal pregnancy, third trimester: Secondary | ICD-10-CM

## 2015-07-25 DIAGNOSIS — G44211 Episodic tension-type headache, intractable: Secondary | ICD-10-CM

## 2015-07-25 DIAGNOSIS — N760 Acute vaginitis: Secondary | ICD-10-CM

## 2015-07-25 DIAGNOSIS — B9689 Other specified bacterial agents as the cause of diseases classified elsewhere: Secondary | ICD-10-CM

## 2015-07-25 LAB — POCT URINALYSIS DIPSTICK
Bilirubin, UA: NEGATIVE
GLUCOSE UA: NEGATIVE
Ketones, UA: NEGATIVE
Leukocytes, UA: NEGATIVE
Nitrite, UA: NEGATIVE
PH UA: 7.5
PROTEIN UA: NEGATIVE
RBC UA: NEGATIVE
Spec Grav, UA: 1.015
Urobilinogen, UA: NEGATIVE

## 2015-07-25 MED ORDER — OXYCODONE-ACETAMINOPHEN 5-325 MG PO TABS: 1.0000 | ORAL_TABLET | ORAL | Status: DC | PRN

## 2015-07-25 MED ORDER — BUTALBITAL-APAP-CAFFEINE 50-325-40 MG PO TABS: 1.0000 | ORAL_TABLET | Freq: Four times a day (QID) | ORAL | Status: DC | PRN

## 2015-07-25 MED ORDER — METRONIDAZOLE 500 MG PO TABS: 500.0000 mg | ORAL_TABLET | Freq: Two times a day (BID) | ORAL | Status: DC

## 2015-07-25 NOTE — Progress Notes (Signed)
Subjective:    Lauren Conway is a 24 y.o. female being seen today for her obstetrical visit. She is at [redacted]w[redacted]d gestation. Patient reports backache, headache, no bleeding, no leaking and occasional contractions.  States she is feeling the contractions with increasing pain/pressure.  Fetal movement: normal.  Is scared to take the Wellbutrin for depression.    Problem List Items Addressed This Visit    None    Visit Diagnoses    Encounter for supervision of other normal pregnancy in third trimester    -  Primary    Relevant Orders    POCT urinalysis dipstick (Completed)    Intractable episodic tension-type headache        Relevant Medications    butalbital-acetaminophen-caffeine (FIORICET) 50-325-40 MG per tablet    oxyCODONE-acetaminophen (PERCOCET/ROXICET) 5-325 MG per tablet      Patient Active Problem List   Diagnosis Date Noted  . Cervical insufficiency during pregnancy in first trimester, antepartum   . Encounter for fetal anatomic survey   . Chlamydia trachomatis infection 06/12/2014  . STD (sexually transmitted disease)     Objective:    LMP 11/06/2014 (Approximate) FHT: 140 BPM  Uterine Size: size equals dates  Presentations: cephalic  Pelvic Exam:              Dilation: Closed       Effacement: Long             Station:  -3    Consistency: soft            Position: posterior   NST: reactive, + accels, no decels.  Cxns every 3-7 minutes  Assessment:  HA Braxton Hicks Contractions  Pregnancy @ [redacted]w[redacted]d weeks   Plan:   Plans for delivery: Vaginal anticipated; labs reviewed; problem list updated Counseling: Consent signed. Infant feeding: plans to breastfeed. Cigarette smoking: never smoked. L&D discussion: symptoms of labor, discussed when to call, discussed what number to call, anesthetic/analgesic options reviewed and delivering clinician:  plans Certified Nurse-Midwife. Postpartum supports and preparation: circumcision discussed and contraception plans  discussed.  Follow up on Friday.

## 2015-07-26 ENCOUNTER — Inpatient Hospital Stay (HOSPITAL_COMMUNITY)
Admission: AD | Admit: 2015-07-26 | Discharge: 2015-07-26 | Disposition: A | Discharge status: Home / Self Care | Payer: Medicaid Other | Source: Ambulatory Visit | Attending: Obstetrics | Admitting: Obstetrics

## 2015-07-26 ENCOUNTER — Encounter (HOSPITAL_COMMUNITY): Payer: Self-pay | Admitting: *Deleted

## 2015-07-26 ENCOUNTER — Encounter: Payer: Medicaid Other | Admitting: Certified Nurse Midwife

## 2015-07-26 DIAGNOSIS — Z3493 Encounter for supervision of normal pregnancy, unspecified, third trimester: Secondary | ICD-10-CM | POA: Diagnosis not present

## 2015-07-26 LAB — URINALYSIS, ROUTINE W REFLEX MICROSCOPIC
Bilirubin Urine: NEGATIVE
Glucose, UA: NEGATIVE mg/dL
Hgb urine dipstick: NEGATIVE
Ketones, ur: NEGATIVE mg/dL
Leukocytes, UA: NEGATIVE
NITRITE: NEGATIVE
Protein, ur: NEGATIVE mg/dL
SPECIFIC GRAVITY, URINE: 1.01 (ref 1.005–1.030)
UROBILINOGEN UA: 0.2 mg/dL (ref 0.0–1.0)
pH: 7.5 (ref 5.0–8.0)

## 2015-07-26 NOTE — MAU Note (Signed)
Pt may be d/c to home with instructions. Dr. Clearance Coots would like pt. to increase po fluids.

## 2015-07-26 NOTE — MAU Note (Signed)
Contractions every 4 min then 2 min apart.  Contracting since yesterday.  Not sure if leaking, clear fluid once.   1 cm yesterday at office.  Baby moving well. No bleeding.

## 2015-07-26 NOTE — Discharge Instructions (Signed)
Braxton Hicks Contractions °Contractions of the uterus can occur throughout pregnancy. Contractions are not always a sign that you are in labor.  °WHAT ARE BRAXTON HICKS CONTRACTIONS?  °Contractions that occur before labor are called Braxton Hicks contractions, or false labor. Toward the end of pregnancy (32-34 weeks), these contractions can develop more often and may become more forceful. This is not true labor because these contractions do not result in opening (dilatation) and thinning of the cervix. They are sometimes difficult to tell apart from true labor because these contractions can be forceful and people have different pain tolerances. You should not feel embarrassed if you go to the hospital with false labor. Sometimes, the only way to tell if you are in true labor is for your health care provider to look for changes in the cervix. °If there are no prenatal problems or other health problems associated with the pregnancy, it is completely safe to be sent home with false labor and await the onset of true labor. °HOW CAN YOU TELL THE DIFFERENCE BETWEEN TRUE AND FALSE LABOR? °False Labor °· The contractions of false labor are usually shorter and not as hard as those of true labor.   °· The contractions are usually irregular.   °· The contractions are often felt in the front of the lower abdomen and in the groin.   °· The contractions may go away when you walk around or change positions while lying down.   °· The contractions get weaker and are shorter lasting as time goes on.   °· The contractions do not usually become progressively stronger, regular, and closer together as with true labor.   °True Labor °· Contractions in true labor last 30-70 seconds, become very regular, usually become more intense, and increase in frequency.   °· The contractions do not go away with walking.   °· The discomfort is usually felt in the top of the uterus and spreads to the lower abdomen and low back.   °· True labor can be  determined by your health care provider with an exam. This will show that the cervix is dilating and getting thinner.   °WHAT TO REMEMBER °· Keep up with your usual exercises and follow other instructions given by your health care provider.   °· Take medicines as directed by your health care provider.   °· Keep your regular prenatal appointments.   °· Eat and drink lightly if you think you are going into labor.   °· If Braxton Hicks contractions are making you uncomfortable:   °¨ Change your position from lying down or resting to walking, or from walking to resting.   °¨ Sit and rest in a tub of warm water.   °¨ Drink 2-3 glasses of water. Dehydration may cause these contractions.   °¨ Do slow and deep breathing several times an hour.   °WHEN SHOULD I SEEK IMMEDIATE MEDICAL CARE? °Seek immediate medical care if: °· Your contractions become stronger, more regular, and closer together.   °· You have fluid leaking or gushing from your vagina.   °· You have a fever.   °· You pass blood-tinged mucus.   °· You have vaginal bleeding.   °· You have continuous abdominal pain.   °· You have low back pain that you never had before.   °· You feel your baby's head pushing down and causing pelvic pressure.   °· Your baby is not moving as much as it used to.   °Document Released: 12/02/2005 Document Revised: 12/07/2013 Document Reviewed: 09/13/2013 °[employer]® Patient Information ©2015 [employer]. This information is not intended to replace advice given to you by your health care   provider. Make sure you discuss any questions you have with your health care provider. ° °

## 2015-07-27 ENCOUNTER — Encounter (HOSPITAL_COMMUNITY): Payer: Self-pay | Admitting: *Deleted

## 2015-07-27 ENCOUNTER — Inpatient Hospital Stay (HOSPITAL_COMMUNITY)
Admission: AD | Admit: 2015-07-27 | Discharge: 2015-07-28 | Disposition: A | Discharge status: Home / Self Care | Payer: Medicaid Other | Source: Ambulatory Visit | Attending: Obstetrics | Admitting: Obstetrics

## 2015-07-27 DIAGNOSIS — Z3493 Encounter for supervision of normal pregnancy, unspecified, third trimester: Secondary | ICD-10-CM | POA: Insufficient documentation

## 2015-07-27 NOTE — MAU Note (Signed)
Pt reports contractions since Tuesday. Pt reports pelvic pressure today. Pt reports that she is having severe lower back pain. She took Percocet at 1600, with no relief.

## 2015-07-27 NOTE — Progress Notes (Signed)
Pt to walk for one hour and return to [facility] for reassessment

## 2015-07-27 NOTE — MAU Note (Signed)
Pt states here last night for contractions. States she is having a lot of pressure today. Is having a lot of back pain. Took a percocet and it did not help. Cervix was 2cm last night. Denies vag bleeding or LOF. +FM

## 2015-07-28 ENCOUNTER — Ambulatory Visit (INDEPENDENT_AMBULATORY_CARE_PROVIDER_SITE_OTHER): Payer: Medicaid Other | Admitting: Certified Nurse Midwife

## 2015-07-28 VITALS — BP 113/76 | HR 93

## 2015-07-28 DIAGNOSIS — O3433 Maternal care for cervical incompetence, third trimester: Secondary | ICD-10-CM | POA: Diagnosis not present

## 2015-07-28 DIAGNOSIS — O479 False labor, unspecified: Secondary | ICD-10-CM

## 2015-07-28 DIAGNOSIS — Z3493 Encounter for supervision of normal pregnancy, unspecified, third trimester: Secondary | ICD-10-CM | POA: Diagnosis not present

## 2015-07-28 DIAGNOSIS — Z3483 Encounter for supervision of other normal pregnancy, third trimester: Secondary | ICD-10-CM | POA: Diagnosis not present

## 2015-07-28 DIAGNOSIS — G4700 Insomnia, unspecified: Secondary | ICD-10-CM

## 2015-07-28 LAB — POCT URINALYSIS DIPSTICK
Bilirubin, UA: NEGATIVE
Blood, UA: NEGATIVE
GLUCOSE UA: NEGATIVE
Ketones, UA: NEGATIVE
LEUKOCYTES UA: NEGATIVE
Nitrite, UA: NEGATIVE
Protein, UA: NEGATIVE
SPEC GRAV UA: 1.015
UROBILINOGEN UA: NEGATIVE
pH, UA: 7

## 2015-07-28 MED ORDER — OXYCODONE HCL 10 MG PO TABS: 10.0000 mg | ORAL_TABLET | Freq: Three times a day (TID) | ORAL | Status: DC | PRN

## 2015-07-28 MED ORDER — ZOLPIDEM TARTRATE ER 6.25 MG PO TBCR: 6.2500 mg | EXTENDED_RELEASE_TABLET | Freq: Every evening | ORAL | Status: DC | PRN

## 2015-07-28 NOTE — Discharge Instructions (Signed)
Braxton Hicks Contractions °Contractions of the uterus can occur throughout pregnancy. Contractions are not always a sign that you are in labor.  °WHAT ARE BRAXTON HICKS CONTRACTIONS?  °Contractions that occur before labor are called Braxton Hicks contractions, or false labor. Toward the end of pregnancy (32-34 weeks), these contractions can develop more often and may become more forceful. This is not true labor because these contractions do not result in opening (dilatation) and thinning of the cervix. They are sometimes difficult to tell apart from true labor because these contractions can be forceful and people have different pain tolerances. You should not feel embarrassed if you go to the hospital with false labor. Sometimes, the only way to tell if you are in true labor is for your health care provider to look for changes in the cervix. °If there are no prenatal problems or other health problems associated with the pregnancy, it is completely safe to be sent home with false labor and await the onset of true labor. °HOW CAN YOU TELL THE DIFFERENCE BETWEEN TRUE AND FALSE LABOR? °False Labor °· The contractions of false labor are usually shorter and not as hard as those of true labor.   °· The contractions are usually irregular.   °· The contractions are often felt in the front of the lower abdomen and in the groin.   °· The contractions may go away when you walk around or change positions while lying down.   °· The contractions get weaker and are shorter lasting as time goes on.   °· The contractions do not usually become progressively stronger, regular, and closer together as with true labor.   °True Labor °· Contractions in true labor last 30-70 seconds, become very regular, usually become more intense, and increase in frequency.   °· The contractions do not go away with walking.   °· The discomfort is usually felt in the top of the uterus and spreads to the lower abdomen and low back.   °· True labor can be  determined by your health care provider with an exam. This will show that the cervix is dilating and getting thinner.   °WHAT TO REMEMBER °· Keep up with your usual exercises and follow other instructions given by your health care provider.   °· Take medicines as directed by your health care provider.   °· Keep your regular prenatal appointments.   °· Eat and drink lightly if you think you are going into labor.   °· If Braxton Hicks contractions are making you uncomfortable:   °¨ Change your position from lying down or resting to walking, or from walking to resting.   °¨ Sit and rest in a tub of warm water.   °¨ Drink 2-3 glasses of water. Dehydration may cause these contractions.   °¨ Do slow and deep breathing several times an hour.   °WHEN SHOULD I SEEK IMMEDIATE MEDICAL CARE? °Seek immediate medical care if: °· Your contractions become stronger, more regular, and closer together.   °· You have fluid leaking or gushing from your vagina.   °· You have a fever.   °· You pass blood-tinged mucus.   °· You have vaginal bleeding.   °· You have continuous abdominal pain.   °· You have low back pain that you never had before.   °· You feel your baby's head pushing down and causing pelvic pressure.   °· Your baby is not moving as much as it used to.   °Document Released: 12/02/2005 Document Revised: 12/07/2013 Document Reviewed: 09/13/2013 °[employer]® Patient Information ©2015 [employer]. This information is not intended to replace advice given to you by your health care   provider. Make sure you discuss any questions you have with your health care provider. ° °

## 2015-07-28 NOTE — Progress Notes (Signed)
Subjective:    Lauren Conway is a 24 y.o. female being seen today for her obstetrical visit. She is at [redacted]w[redacted]d gestation. Patient reports backache, no bleeding, no leaking and occasional contractions. Fetal movement: normal.  Was seen at [facility] last night for contractions, sent home.    Problem List Items Addressed This Visit    None    Visit Diagnoses    Encounter for supervision of other normal pregnancy in third trimester    -  Primary    Relevant Orders    POCT urinalysis dipstick (Completed)    Insomnia        Relevant Medications    zolpidem (AMBIEN CR) 6.25 MG CR tablet    Irregular contractions        Relevant Medications    Oxycodone HCl 10 MG TABS      Patient Active Problem List   Diagnosis Date Noted  . Cervical insufficiency during pregnancy in first trimester, antepartum   . Encounter for fetal anatomic survey   . Chlamydia trachomatis infection 06/12/2014  . STD (sexually transmitted disease)     Objective:    BP 113/76 mmHg  Pulse 93  LMP 11/06/2014 (Approximate) FHT: 130 BPM  Uterine Size: size equals dates  Presentations: cephalic  Pelvic Exam:              Dilation: 2cm       Effacement: 25%             Station:  -3    Consistency: soft            Position: posterior   NST: Cat. 1, reactive, no decels, + accels, contractions about every 8-10 minutes patient able to speak through them.    Assessment:    Pregnancy @ [redacted]w[redacted]d weeks   ? Early labor Reactive NST.    Plan:   Plans for delivery: Vaginal anticipated; labs reviewed; problem list updated Counseling: Consent signed. Infant feeding: plans to breastfeed. Cigarette smoking: never smoked. L&D discussion: symptoms of labor, discussed when to call, discussed what number to call, anesthetic/analgesic options reviewed and delivering clinician:  plans Certified Nurse-Midwife. Postpartum supports and preparation: circumcision discussed and contraception plans discussed.  Follow up in 1 Week.

## 2015-07-29 ENCOUNTER — Inpatient Hospital Stay (HOSPITAL_COMMUNITY)
Admission: AD | Admit: 2015-07-29 | Discharge: 2015-07-29 | Disposition: A | Discharge status: Home / Self Care | Payer: Medicaid Other | Source: Ambulatory Visit | Attending: Obstetrics | Admitting: Obstetrics

## 2015-07-29 ENCOUNTER — Encounter (HOSPITAL_COMMUNITY): Payer: Self-pay | Admitting: *Deleted

## 2015-07-29 DIAGNOSIS — Z3493 Encounter for supervision of normal pregnancy, unspecified, third trimester: Secondary | ICD-10-CM | POA: Diagnosis not present

## 2015-07-29 NOTE — Progress Notes (Signed)
Called to notify of pt arrival in [facility] and vaginal exam. Will discharge home

## 2015-07-29 NOTE — MAU Note (Signed)
Pt states contractions have been on and off since Tuesday 07/25/15.  Pt states she took Ambien last night and had some good rest and then woke up to contractions.  Pt states the contractions became strong between 1100 and 1200 and have been that way ever since.  Pt states she lost her mucus plug today around 1500 and contractions have gotten stronger since.  Pt states baby is still moving.

## 2015-07-29 NOTE — Discharge Instructions (Signed)
Third Trimester of Pregnancy °The third trimester is from week 29 through week 42, months 7 through 9. The third trimester is a time when the fetus is growing rapidly. At the end of the ninth month, the fetus is about 20 inches in length and weighs 6-10 pounds.  °BODY CHANGES °Your body goes through many changes during pregnancy. The changes vary from woman to woman.  °· Your weight will continue to increase. You can expect to gain 25-35 pounds (11-16 kg) by the end of the pregnancy. °· You may begin to get stretch marks on your hips, abdomen, and breasts. °· You may urinate more often because the fetus is moving lower into your pelvis and pressing on your bladder. °· You may develop or continue to have heartburn as a result of your pregnancy. °· You may develop constipation because certain hormones are causing the muscles that push waste through your intestines to slow down. °· You may develop hemorrhoids or swollen, bulging veins (varicose veins). °· You may have pelvic pain because of the weight gain and pregnancy hormones relaxing your joints between the bones in your pelvis. Backaches may result from overexertion of the muscles supporting your posture. °· You may have changes in your hair. These can include thickening of your hair, rapid growth, and changes in texture. Some women also have hair loss during or after pregnancy, or hair that feels dry or thin. Your hair will most likely return to normal after your baby is born. °· Your breasts will continue to grow and be tender. A yellow discharge may leak from your breasts called colostrum. °· Your belly button may stick out. °· You may feel short of breath because of your expanding uterus. °· You may notice the fetus "dropping," or moving lower in your abdomen. °· You may have a bloody mucus discharge. This usually occurs a few days to a week before labor begins. °· Your cervix becomes thin and soft (effaced) near your due date. °WHAT TO EXPECT AT YOUR PRENATAL  EXAMS  °You will have prenatal exams every 2 weeks until week 36. Then, you will have weekly prenatal exams. During a routine prenatal visit: °· You will be weighed to make sure you and the fetus are growing normally. °· Your blood pressure is taken. °· Your abdomen will be measured to track your baby's growth. °· The fetal heartbeat will be listened to. °· Any test results from the previous visit will be discussed. °· You may have a cervical check near your due date to see if you have effaced. °At around 36 weeks, your caregiver will check your cervix. At the same time, your caregiver will also perform a test on the secretions of the vaginal tissue. This test is to determine if a type of bacteria, Group B streptococcus, is present. Your caregiver will explain this further. °Your caregiver may ask you: °· What your birth plan is. °· How you are feeling. °· If you are feeling the baby move. °· If you have had any abnormal symptoms, such as leaking fluid, bleeding, severe headaches, or abdominal cramping. °· If you have any questions. °Other tests or screenings that may be performed during your third trimester include: °· Blood tests that check for low iron levels (anemia). °· Fetal testing to check the health, activity level, and growth of the fetus. Testing is done if you have certain medical conditions or if there are problems during the pregnancy. °FALSE LABOR °You may feel small, irregular contractions that   eventually go away. These are called Braxton Hicks contractions, or false labor. Contractions may last for hours, days, or even weeks before true labor sets in. If contractions come at regular intervals, intensify, or become painful, it is best to be seen by your caregiver.  °SIGNS OF LABOR  °· Menstrual-like cramps. °· Contractions that are 5 minutes apart or less. °· Contractions that start on the top of the uterus and spread down to the lower abdomen and back. °· A sense of increased pelvic pressure or back  pain. °· A watery or bloody mucus discharge that comes from the vagina. °If you have any of these signs before the 37th week of pregnancy, call your caregiver right away. You need to go to the hospital to get checked immediately. °HOME CARE INSTRUCTIONS  °· Avoid all smoking, herbs, alcohol, and unprescribed drugs. These chemicals affect the formation and growth of the baby. °· Follow your caregiver's instructions regarding medicine use. There are medicines that are either safe or unsafe to take during pregnancy. °· Exercise only as directed by your caregiver. Experiencing uterine cramps is a good sign to stop exercising. °· Continue to eat regular, healthy meals. °· Wear a good support bra for breast tenderness. °· Do not use hot tubs, steam rooms, or saunas. °· Wear your seat belt at all times when driving. °· Avoid raw meat, uncooked cheese, cat litter boxes, and soil used by cats. These carry germs that can cause birth defects in the baby. °· Take your prenatal vitamins. °· Try taking a stool softener (if your caregiver approves) if you develop constipation. Eat more high-fiber foods, such as fresh vegetables or fruit and whole grains. Drink plenty of fluids to keep your urine clear or pale yellow. °· Take warm sitz baths to soothe any pain or discomfort caused by hemorrhoids. Use hemorrhoid cream if your caregiver approves. °· If you develop varicose veins, wear support hose. Elevate your feet for 15 minutes, 3-4 times a day. Limit salt in your diet. °· Avoid heavy lifting, wear low heal shoes, and practice good posture. °· Rest a lot with your legs elevated if you have leg cramps or low back pain. °· Visit your dentist if you have not gone during your pregnancy. Use a soft toothbrush to brush your teeth and be gentle when you floss. °· A sexual relationship may be continued unless your caregiver directs you otherwise. °· Do not travel far distances unless it is absolutely necessary and only with the approval  of your caregiver. °· Take prenatal classes to understand, practice, and ask questions about the labor and delivery. °· Make a trial run to the hospital. °· Pack your hospital bag. °· Prepare the baby's nursery. °· Continue to go to all your prenatal visits as directed by your caregiver. °SEEK MEDICAL CARE IF: °· You are unsure if you are in labor or if your water has broken. °· You have dizziness. °· You have mild pelvic cramps, pelvic pressure, or nagging pain in your abdominal area. °· You have persistent nausea, vomiting, or diarrhea. °· You have a bad smelling vaginal discharge. °· You have pain with urination. °SEEK IMMEDIATE MEDICAL CARE IF:  °· You have a fever. °· You are leaking fluid from your vagina. °· You have spotting or bleeding from your vagina. °· You have severe abdominal cramping or pain. °· You have rapid weight loss or gain. °· You have shortness of breath with chest pain. °· You notice sudden or extreme swelling   of your face, hands, ankles, feet, or legs. °· You have not felt your baby move in over an hour. °· You have severe headaches that do not go away with medicine. °· You have vision changes. °Document Released: 11/26/2001 Document Revised: 12/07/2013 Document Reviewed: 02/02/2013 °[employer]® Patient Information ©2015 [employer]. This information is not intended to replace advice given to you by your health care provider. Make sure you discuss any questions you have with your health care provider. °Fetal Movement Counts °Patient Name: __________________________________________________ Patient Due Date: ____________________ °Performing a fetal movement count is highly recommended in high-risk pregnancies, but it is good for every pregnant woman to do. Your health care provider may ask you to start counting fetal movements at 28 weeks of the pregnancy. Fetal movements often increase: °· After eating a full meal. °· After physical activity. °· After eating or drinking something sweet or  cold. °· At rest. °Pay attention to when you feel the baby is most active. This will help you notice a pattern of your baby's sleep and wake cycles and what factors contribute to an increase in fetal movement. It is important to perform a fetal movement count at the same time each day when your baby is normally most active.  °HOW TO COUNT FETAL MOVEMENTS °· Find a quiet and comfortable area to sit or lie down on your left side. Lying on your left side provides the best blood and oxygen circulation to your baby. °· Write down the day and time on a sheet of paper or in a journal. °· Start counting kicks, flutters, swishes, rolls, or jabs in a 2-hour period. You should feel at least 10 movements within 2 hours. °· If you do not feel 10 movements in 2 hours, wait 2-3 hours and count again. Look for a change in the pattern or not enough counts in 2 hours. °SEEK MEDICAL CARE IF: °· You feel less than 10 counts in 2 hours, tried twice. °· There is no movement in over an hour. °· The pattern is changing or taking longer each day to reach 10 counts in 2 hours. °· You feel the baby is not moving as he or she usually does. °Date: ____________ Movements: ____________ Start time: ____________ Finish time: ____________  °Date: ____________ Movements: ____________ Start time: ____________ Finish time: ____________ °Date: ____________ Movements: ____________ Start time: ____________ Finish time: ____________ °Date: ____________ Movements: ____________ Start time: ____________ Finish time: ____________ °Date: ____________ Movements: ____________ Start time: ____________ Finish time: ____________ °Date: ____________ Movements: ____________ Start time: ____________ Finish time: ____________ °Date: ____________ Movements: ____________ Start time: ____________ Finish time: ____________ °Date: ____________ Movements: ____________ Start time: ____________ Finish time: ____________  °Date: ____________ Movements: ____________ Start time:  ____________ Finish time: ____________ °Date: ____________ Movements: ____________ Start time: ____________ Finish time: ____________ °Date: ____________ Movements: ____________ Start time: ____________ Finish time: ____________ °Date: ____________ Movements: ____________ Start time: ____________ Finish time: ____________ °Date: ____________ Movements: ____________ Start time: ____________ Finish time: ____________ °Date: ____________ Movements: ____________ Start time: ____________ Finish time: ____________ °Date: ____________ Movements: ____________ Start time: ____________ Finish time: ____________  °Date: ____________ Movements: ____________ Start time: ____________ Finish time: ____________ °Date: ____________ Movements: ____________ Start time: ____________ Finish time: ____________ °Date: ____________ Movements: ____________ Start time: ____________ Finish time: ____________ °Date: ____________ Movements: ____________ Start time: ____________ Finish time: ____________ °Date: ____________ Movements: ____________ Start time: ____________ Finish time: ____________ °Date: ____________ Movements: ____________ Start time: ____________ Finish time: ____________ °Date: ____________ Movements: ____________ Start time: ____________ Finish time:   ____________  °Date: ____________ Movements: ____________ Start time: ____________ Finish time: ____________ °Date: ____________ Movements: ____________ Start time: ____________ Finish time: ____________ °Date: ____________ Movements: ____________ Start time: ____________ Finish time: ____________ °Date: ____________ Movements: ____________ Start time: ____________ Finish time: ____________ °Date: ____________ Movements: ____________ Start time: ____________ Finish time: ____________ °Date: ____________ Movements: ____________ Start time: ____________ Finish time: ____________ °Date: ____________ Movements: ____________ Start time: ____________ Finish time: ____________  °Date:  ____________ Movements: ____________ Start time: ____________ Finish time: ____________ °Date: ____________ Movements: ____________ Start time: ____________ Finish time: ____________ °Date: ____________ Movements: ____________ Start time: ____________ Finish time: ____________ °Date: ____________ Movements: ____________ Start time: ____________ Finish time: ____________ °Date: ____________ Movements: ____________ Start time: ____________ Finish time: ____________ °Date: ____________ Movements: ____________ Start time: ____________ Finish time: ____________ °Date: ____________ Movements: ____________ Start time: ____________ Finish time: ____________  °Date: ____________ Movements: ____________ Start time: ____________ Finish time: ____________ °Date: ____________ Movements: ____________ Start time: ____________ Finish time: ____________ °Date: ____________ Movements: ____________ Start time: ____________ Finish time: ____________ °Date: ____________ Movements: ____________ Start time: ____________ Finish time: ____________ °Date: ____________ Movements: ____________ Start time: ____________ Finish time: ____________ °Date: ____________ Movements: ____________ Start time: ____________ Finish time: ____________ °Date: ____________ Movements: ____________ Start time: ____________ Finish time: ____________  °Date: ____________ Movements: ____________ Start time: ____________ Finish time: ____________ °Date: ____________ Movements: ____________ Start time: ____________ Finish time: ____________ °Date: ____________ Movements: ____________ Start time: ____________ Finish time: ____________ °Date: ____________ Movements: ____________ Start time: ____________ Finish time: ____________ °Date: ____________ Movements: ____________ Start time: ____________ Finish time: ____________ °Date: ____________ Movements: ____________ Start time: ____________ Finish time: ____________ °Date: ____________ Movements: ____________ Start  time: ____________ Finish time: ____________  °Date: ____________ Movements: ____________ Start time: ____________ Finish time: ____________ °Date: ____________ Movements: ____________ Start time: ____________ Finish time: ____________ °Date: ____________ Movements: ____________ Start time: ____________ Finish time: ____________ °Date: ____________ Movements: ____________ Start time: ____________ Finish time: ____________ °Date: ____________ Movements: ____________ Start time: ____________ Finish time: ____________ °Date: ____________ Movements: ____________ Start time: ____________ Finish time: ____________ °Document Released: 01/01/2007 Document Revised: 04/18/2014 Document Reviewed: 09/28/2012 °[employer]® Patient Information ©2015 [employer]. This information is not intended to replace advice given to you by your health care provider. Make sure you discuss any questions you have with your health care provider. °Braxton Hicks Contractions °Contractions of the uterus can occur throughout pregnancy. Contractions are not always a sign that you are in labor.  °WHAT ARE BRAXTON HICKS CONTRACTIONS?  °Contractions that occur before labor are called Braxton Hicks contractions, or false labor. Toward the end of pregnancy (32-34 weeks), these contractions can develop more often and may become more forceful. This is not true labor because these contractions do not result in opening (dilatation) and thinning of the cervix. They are sometimes difficult to tell apart from true labor because these contractions can be forceful and people have different pain tolerances. You should not feel embarrassed if you go to the hospital with false labor. Sometimes, the only way to tell if you are in true labor is for your health care provider to look for changes in the cervix. °If there are no prenatal problems or other health problems associated with the pregnancy, it is completely safe to be sent home with false labor and await the  onset of true labor. °HOW CAN YOU TELL THE DIFFERENCE BETWEEN TRUE AND FALSE LABOR? °False Labor °· The contractions of false labor are usually shorter and not as hard as those of true labor.   °· The contractions   are usually irregular.   °· The contractions are often felt in the front of the lower abdomen and in the groin.   °· The contractions may go away when you walk around or change positions while lying down.   °· The contractions get weaker and are shorter lasting as time goes on.   °· The contractions do not usually become progressively stronger, regular, and closer together as with true labor.   °True Labor °· Contractions in true labor last 30-70 seconds, become very regular, usually become more intense, and increase in frequency.   °· The contractions do not go away with walking.   °· The discomfort is usually felt in the top of the uterus and spreads to the lower abdomen and low back.   °· True labor can be determined by your health care provider with an exam. This will show that the cervix is dilating and getting thinner.   °WHAT TO REMEMBER °· Keep up with your usual exercises and follow other instructions given by your health care provider.   °· Take medicines as directed by your health care provider.   °· Keep your regular prenatal appointments.   °· Eat and drink lightly if you think you are going into labor.   °· If Braxton Hicks contractions are making you uncomfortable:   °· Change your position from lying down or resting to walking, or from walking to resting.   °· Sit and rest in a tub of warm water.   °· Drink 2-3 glasses of water. Dehydration may cause these contractions.   °· Do slow and deep breathing several times an hour.   °WHEN SHOULD I SEEK IMMEDIATE MEDICAL CARE? °Seek immediate medical care if: °· Your contractions become stronger, more regular, and closer together.   °· You have fluid leaking or gushing from your vagina.   °· You have a fever.   °· You pass blood-tinged mucus.    °· You have vaginal bleeding.   °· You have continuous abdominal pain.   °· You have low back pain that you never had before.   °· You feel your baby's head pushing down and causing pelvic pressure.   °· Your baby is not moving as much as it used to.   °Document Released: 12/02/2005 Document Revised: 12/07/2013 Document Reviewed: 09/13/2013 °[employer]® Patient Information ©2015 [employer]. This information is not intended to replace advice given to you by your health care provider. Make sure you discuss any questions you have with your health care provider. ° °

## 2015-08-01 ENCOUNTER — Ambulatory Visit (INDEPENDENT_AMBULATORY_CARE_PROVIDER_SITE_OTHER): Payer: Medicaid Other | Admitting: Certified Nurse Midwife

## 2015-08-01 VITALS — BP 120/66 | HR 105 | Temp 98.3°F | Wt 206.2 lb

## 2015-08-01 DIAGNOSIS — Z3483 Encounter for supervision of other normal pregnancy, third trimester: Secondary | ICD-10-CM

## 2015-08-01 LAB — POCT URINALYSIS DIPSTICK
Bilirubin, UA: NEGATIVE
Blood, UA: NEGATIVE
Glucose, UA: NEGATIVE
KETONES UA: NEGATIVE
Leukocytes, UA: NEGATIVE
Nitrite, UA: NEGATIVE
PH UA: 6.5
PROTEIN UA: NEGATIVE
Spec Grav, UA: 1.01
Urobilinogen, UA: NEGATIVE

## 2015-08-01 NOTE — Progress Notes (Signed)
Subjective:    Lauren Conway is a 24 y.o. female being seen today for her obstetrical visit. She is at [redacted]w[redacted]d gestation. Patient reports backache, no bleeding, no leaking and occasional contractions. Fetal movement: decreased.  Problem List Items Addressed This Visit    None    Visit Diagnoses    Encounter for supervision of other normal pregnancy in third trimester    -  Primary    Relevant Orders    POCT urinalysis dipstick (Completed)      Patient Active Problem List   Diagnosis Date Noted  . Cervical insufficiency during pregnancy in first trimester, antepartum   . Encounter for fetal anatomic survey   . Chlamydia trachomatis infection 06/12/2014  . STD (sexually transmitted disease)     Objective:    BP 120/66 mmHg  Pulse 105  Temp(Src) 98.3 F (36.8 C)  Wt 206 lb 3.2 oz (93.532 kg)  LMP 11/06/2014 (Approximate) FHT: 150 BPM  Uterine Size: 40 cm and size greater than dates  Presentations: cephalic  Pelvic Exam:              Dilation: 3cm       Effacement: 25%             Station:  -3    Consistency: soft            Position: middle   NST for patient report of decreased fetal movement:  Cat. 1 tracing.  + accels, no decels.  Occasional uterine irritability.    Assessment:    Pregnancy @ [redacted]w[redacted]d weeks  Reactive NST Plan:   Plans for delivery: Vaginal anticipated; labs reviewed; problem list updated Counseling: Consent signed. Infant feeding: plans to breastfeed. Cigarette smoking: never smoked. L&D discussion: symptoms of labor, discussed when to call, discussed what number to call, anesthetic/analgesic options reviewed and delivering clinician:  plans Certified Nurse-Midwife. Postpartum supports and preparation: circumcision discussed and contraception plans discussed.  Follow up in 1 Week.

## 2015-08-02 ENCOUNTER — Encounter: Payer: Medicaid Other | Admitting: Certified Nurse Midwife

## 2015-08-02 ENCOUNTER — Ambulatory Visit (INDEPENDENT_AMBULATORY_CARE_PROVIDER_SITE_OTHER): Payer: Medicaid Other | Admitting: Certified Nurse Midwife

## 2015-08-02 ENCOUNTER — Inpatient Hospital Stay (HOSPITAL_COMMUNITY)
Admission: AD | Admit: 2015-08-02 | Discharge: 2015-08-02 | Disposition: A | Discharge status: Home / Self Care | Payer: Medicaid Other | Source: Ambulatory Visit | Attending: Obstetrics | Admitting: Obstetrics

## 2015-08-02 ENCOUNTER — Encounter (HOSPITAL_COMMUNITY): Payer: Self-pay | Admitting: *Deleted

## 2015-08-02 VITALS — BP 125/76 | HR 112 | Wt 206.0 lb

## 2015-08-02 DIAGNOSIS — Z3483 Encounter for supervision of other normal pregnancy, third trimester: Secondary | ICD-10-CM

## 2015-08-02 NOTE — MAU Note (Signed)
Pt reports contractions that have worsened today, SVE today 3cm.

## 2015-08-02 NOTE — Progress Notes (Signed)
Subjective:    Lauren Conway is a 24 y.o. female being seen today for her obstetrical visit. She is at [redacted]w[redacted]d gestation. Patient reports backache, no bleeding, no leaking and occasional contractions. Fetal movement: normal.  Was seen in MAU overnight for labor check, not in labor discharged home.    Problem List Items Addressed This Visit    None    Visit Diagnoses    Encounter for supervision of other normal pregnancy in third trimester    -  Primary    Relevant Orders    POCT urinalysis dipstick      Patient Active Problem List   Diagnosis Date Noted  . Cervical insufficiency during pregnancy in first trimester, antepartum   . Encounter for fetal anatomic survey   . Chlamydia trachomatis infection 06/12/2014  . STD (sexually transmitted disease)     Objective:    BP 125/76 mmHg  Pulse 112  Wt 206 lb (93.441 kg)  LMP 11/06/2014 (Approximate) FHT: 135 BPM  Uterine Size: size equals dates  Presentations: cephalic  Pelvic Exam:              Dilation: 3cm       Effacement: 25%             Station:  -3    Consistency: soft            Position: posterior   White mucous discharge present with exam, no blood.    Assessment:    Pregnancy @ [redacted]w[redacted]d weeks   Early labor symptoms  Plan:   Plans for delivery: Vaginal anticipated; labs reviewed; problem list updated Counseling: Consent signed. Infant feeding: plans to breastfeed. Cigarette smoking: never smoked. L&D discussion: symptoms of labor, discussed when to call, discussed what number to call, anesthetic/analgesic options reviewed and delivering clinician:  plans Certified Nurse-Midwife. Postpartum supports and preparation: circumcision discussed and contraception plans discussed.  Follow up in 1 Week with NST.

## 2015-08-02 NOTE — MAU Note (Signed)
PT  SAYS HURT   BAD   SINCE    .  VE  IN OFFICE  .   DENIES  HSV AND MRSA.    GBS- NEG

## 2015-08-03 ENCOUNTER — Inpatient Hospital Stay (HOSPITAL_COMMUNITY)
Admission: AD | Admit: 2015-08-03 | Discharge: 2015-08-03 | DRG: 782 | Disposition: A | Discharge status: Home / Self Care | Payer: Medicaid Other | Source: Ambulatory Visit | Attending: Obstetrics | Admitting: Obstetrics

## 2015-08-03 ENCOUNTER — Encounter (HOSPITAL_COMMUNITY): Payer: Self-pay

## 2015-08-03 DIAGNOSIS — Z3A38 38 weeks gestation of pregnancy: Secondary | ICD-10-CM | POA: Diagnosis present

## 2015-08-03 LAB — TYPE AND SCREEN
ABO/RH(D): O POS
Antibody Screen: NEGATIVE

## 2015-08-03 LAB — CBC
HEMATOCRIT: 30.9 % — AB (ref 36.0–46.0)
HEMOGLOBIN: 9.9 g/dL — AB (ref 12.0–15.0)
MCH: 27.9 pg (ref 26.0–34.0)
MCHC: 32 g/dL (ref 30.0–36.0)
MCV: 87 fL (ref 78.0–100.0)
PLATELETS: 288 10*3/uL (ref 150–400)
RBC: 3.55 MIL/uL — AB (ref 3.87–5.11)
RDW: 13.7 % (ref 11.5–15.5)
WBC: 8 10*3/uL (ref 4.0–10.5)

## 2015-08-03 LAB — RPR: RPR: NONREACTIVE

## 2015-08-03 LAB — AMNISURE RUPTURE OF MEMBRANE (ROM) NOT AT ARMC: AMNISURE: NEGATIVE

## 2015-08-03 MED ORDER — LACTATED RINGERS IV SOLN
INTRAVENOUS | Status: DC
  Administered 2015-08-03: 08:00:00 via INTRAVENOUS

## 2015-08-03 MED ORDER — OXYCODONE-ACETAMINOPHEN 5-325 MG PO TABS: 2.0000 | ORAL_TABLET | ORAL | Status: DC | PRN

## 2015-08-03 MED ORDER — FENTANYL CITRATE (PF) 100 MCG/2ML IJ SOLN: 100.0000 ug | INTRAMUSCULAR | Status: DC | PRN

## 2015-08-03 MED ORDER — MORPHINE SULFATE (PF) 10 MG/ML IV SOLN
10.0000 mg | Freq: Once | INTRAVENOUS | Status: AC
  Administered 2015-08-03: 10 mg via INTRAMUSCULAR
  Filled 2015-08-03: qty 1

## 2015-08-03 MED ORDER — LIDOCAINE HCL (PF) 1 % IJ SOLN: 30.0000 mL | INTRAMUSCULAR | Status: DC | PRN

## 2015-08-03 MED ORDER — DIPHENHYDRAMINE HCL 50 MG/ML IJ SOLN: 12.5000 mg | INTRAMUSCULAR | Status: DC | PRN

## 2015-08-03 MED ORDER — OXYTOCIN 40 UNITS IN LACTATED RINGERS INFUSION - SIMPLE MED: 62.5000 mL/h | INTRAVENOUS | Status: DC

## 2015-08-03 MED ORDER — ONDANSETRON HCL 4 MG/2ML IJ SOLN: 4.0000 mg | Freq: Four times a day (QID) | INTRAMUSCULAR | Status: DC | PRN

## 2015-08-03 MED ORDER — PHENYLEPHRINE 40 MCG/ML (10ML) SYRINGE FOR IV PUSH (FOR BLOOD PRESSURE SUPPORT): 80.0000 ug | PREFILLED_SYRINGE | INTRAVENOUS | Status: DC | PRN

## 2015-08-03 MED ORDER — OXYCODONE-ACETAMINOPHEN 5-325 MG PO TABS: 1.0000 | ORAL_TABLET | ORAL | Status: DC | PRN

## 2015-08-03 MED ORDER — OXYTOCIN BOLUS FROM INFUSION: 500.0000 mL | INTRAVENOUS | Status: DC

## 2015-08-03 MED ORDER — EPHEDRINE 5 MG/ML INJ: 10.0000 mg | INTRAVENOUS | Status: DC | PRN

## 2015-08-03 MED ORDER — ACETAMINOPHEN 325 MG PO TABS: 650.0000 mg | ORAL_TABLET | ORAL | Status: DC | PRN

## 2015-08-03 MED ORDER — LACTATED RINGERS IV SOLN: 500.0000 mL | INTRAVENOUS | Status: DC | PRN

## 2015-08-03 MED ORDER — PROMETHAZINE HCL 25 MG/ML IJ SOLN
25.0000 mg | Freq: Once | INTRAMUSCULAR | Status: AC
  Administered 2015-08-03: 25 mg via INTRAMUSCULAR
  Filled 2015-08-03: qty 1

## 2015-08-03 MED ORDER — FENTANYL 2.5 MCG/ML BUPIVACAINE 1/10 % EPIDURAL INFUSION (WH - ANES): 14.0000 mL/h | INTRAMUSCULAR | Status: DC | PRN

## 2015-08-03 MED ORDER — CITRIC ACID-SODIUM CITRATE 334-500 MG/5ML PO SOLN: 30.0000 mL | ORAL | Status: DC | PRN

## 2015-08-03 MED ORDER — FLEET ENEMA 7-19 GM/118ML RE ENEM: 1.0000 | ENEMA | RECTAL | Status: DC | PRN

## 2015-08-03 NOTE — MAU Note (Signed)
Pt reports worsening contractions today.denies ROM

## 2015-08-03 NOTE — Discharge Summary (Signed)
    OB Discharge Summary  Patient Name: Lauren Conway DOB: 07/18/1991 MRN: [id number]  Date of admission: 08/03/2015   Date of discharge: 8 / 18 / 16  Admitting diagnosis: UC's.  R/O labor.   Intrauterine pregnancy: [redacted]w[redacted]d     Secondary diagnosis: None     Discharge diagnosis: UC's.  Not in labor.  Hospital course:  Admitted with painful UC's but no cervical change.  Therapeutic rest with Morphine / Phenergan instituted.  Contractions spaced out.  Patient discharged home undelivered at 38.3 weeks.  Physical exam  Filed Vitals:   08/03/15 1132 08/03/15 1239 08/03/15 1241 08/03/15 1342  BP: 112/42 117/58 117/58 103/44  Pulse: 88 90 90 87  Temp:      TempSrc:      Resp:  14  16  Height:      Weight:      SpO2:       General: alert and no distress  Cervix:  3cm / 50% / -2 / Vtx Labs: Lab Results  Component Value Date   WBC 8.0 08/03/2015   HGB 9.9* 08/03/2015   HCT 30.9* 08/03/2015   MCV 87.0 08/03/2015   PLT 288 08/03/2015   CMP Latest Ref Rng 12/29/2012  Glucose 70 - 99 mg/dL 84  BUN 6 - 23 mg/dL 6  Creatinine 7.82 - 9.56 mg/dL 2.13  Sodium 086 - 578 mEq/L 134(L)  Potassium 3.5 - 5.1 mEq/L 3.5  Chloride 96 - 112 mEq/L 100  CO2 19 - 32 mEq/L 23  Calcium 8.4 - 10.5 mg/dL 9.1  Total Protein 6.0 - 8.3 g/dL 6.2  Total Bilirubin 0.3 - 1.2 mg/dL 4.6(N)  Alkaline Phos 39 - 117 U/L 82  AST 0 - 37 U/L 19  ALT 0 - 35 U/L 16    Discharge instruction:  Routine labor instructions  Medications:   Medication List    STOP taking these medications        acetaminophen 500 MG tablet  Commonly known as:  TYLENOL      TAKE these medications        albuterol 108 (90 BASE) MCG/ACT inhaler  Commonly known as:  PROVENTIL HFA;VENTOLIN HFA  Inhale 2 puffs into the lungs every 6 (six) hours as needed for wheezing or shortness of breath.     buPROPion 100 MG 12 hr tablet  Commonly known as:  WELLBUTRIN SR  Take 1 tablet (100 mg total) by mouth 2 (two) times daily.     oxyCODONE-acetaminophen 5-325 MG per tablet  Commonly known as:  PERCOCET/ROXICET  Take 1 tablet by mouth every 4 (four) hours as needed for severe pain.     prenatal multivitamin Tabs tablet  Take 1 tablet by mouth daily.     zolpidem 6.25 MG CR tablet  Commonly known as:  AMBIEN CR  Take 1 tablet (6.25 mg total) by mouth at bedtime as needed for sleep.        Diet: routine diet  Outpatient follow up:1 week    08/03/2015 Kadeisha Betsch A, MD

## 2015-08-03 NOTE — H&P (Signed)
Lauren Conway is a 24 y.o. female presenting for labor evaluation with regular contractions. Maternal Medical History:  Reason for admission: Contractions.   Contractions: Onset was 6-12 hours ago.   Frequency: irregular.   Perceived severity is moderate.    Fetal activity: Perceived fetal activity is normal.   Last perceived fetal movement was within the past hour.    Prenatal complications: no prenatal complications Prenatal Complications - Diabetes: none.    OB History    Gravida Para Term Preterm AB TAB SAB Ectopic Multiple Living   3 1 1  1  1   1      Past Medical History  Diagnosis Date  . History of gonorrhea     12/2008; 09/2010;   . History of chlamydia     12/2008; 03/09/2009  . Asthma   . Anxiety   . Depression    Past Surgical History  Procedure Laterality Date  . No past surgeries     Family History: family history includes Asthma in her maternal grandmother; Hypertension in her mother. There is no history of Other. Social History:  reports that she has quit smoking. She has never used smokeless tobacco. She reports that she does not drink alcohol or use illicit drugs.   Prenatal Transfer Tool  Maternal Diabetes: No Genetic Screening: Normal Maternal Ultrasounds/Referrals: Normal Fetal Ultrasounds or other Referrals:  None Maternal Substance Abuse:  No Significant Maternal Medications:  None Significant Maternal Lab Results:  None Other Comments:  None  Review of Systems  Constitutional: Negative.   HENT: Negative.   Eyes: Negative.   Respiratory: Negative.   Cardiovascular: Negative.   Gastrointestinal: Negative.   Genitourinary: Negative.   Musculoskeletal: Negative.   Skin: Negative.   Neurological: Negative.   Endo/Heme/Allergies: Negative.   Psychiatric/Behavioral: Positive for depression.    Dilation: 3.5 Effacement (%): 50 Station: -2 Exam by:: [employer] Blood pressure 85/43, pulse 99, temperature 98.2 F (36.8 C),  temperature source Oral, resp. rate 16, height 5\' 4"  (1.626 m), weight 206 lb (93.441 kg), last menstrual period 11/06/2014, SpO2 99 %. Maternal Exam:  Uterine Assessment: Contraction strength is firm.  Contraction duration is 60 seconds. Contraction frequency is regular.   Abdomen: Patient reports no abdominal tenderness. Estimated fetal weight is 7.5-8#.   Fetal presentation: vertex  Introitus: Normal vulva. Normal vagina.  Pelvis: adequate for delivery.   Cervix: Cervix evaluated by digital exam.     Fetal Exam Fetal Monitor Review: Variability: moderate (6-25 bpm).   Pattern: accelerations present and no decelerations.    Fetal State Assessment: Category I - tracings are normal.     Physical Exam  Constitutional: She is oriented to person, place, and time. She appears well-developed and well-nourished.  HENT:  Head: Normocephalic and atraumatic.  Neck: Normal range of motion. Neck supple.  Cardiovascular: Normal rate, regular rhythm and normal heart sounds.   Respiratory: Effort normal and breath sounds normal.  GI: Soft. Bowel sounds are normal.  Genitourinary: Vagina normal and uterus normal.  Musculoskeletal: Normal range of motion.  Neurological: She is alert and oriented to person, place, and time.  Skin: Skin is warm and dry.  Psychiatric: She has a normal mood and affect. Her behavior is normal.    Prenatal labs: ABO, Rh:   Antibody:   Rubella:   RPR: NON REAC (05/18 1023)  HBsAg:    HIV: NONREACTIVE (05/18 1023)  GBS: NOT DETECTED (07/27 1650)   Assessment/Plan: Labor rest, revaluate in several hours.  Lauren Conway A Lauren Conway 08/03/2015, 11:07 AM

## 2015-08-04 ENCOUNTER — Ambulatory Visit (INDEPENDENT_AMBULATORY_CARE_PROVIDER_SITE_OTHER): Payer: Medicaid Other | Admitting: Certified Nurse Midwife

## 2015-08-04 VITALS — BP 121/77 | HR 97 | Temp 97.8°F | Wt 208.0 lb

## 2015-08-04 DIAGNOSIS — O3433 Maternal care for cervical incompetence, third trimester: Secondary | ICD-10-CM

## 2015-08-04 DIAGNOSIS — Z3483 Encounter for supervision of other normal pregnancy, third trimester: Secondary | ICD-10-CM

## 2015-08-04 LAB — POCT URINALYSIS DIPSTICK
Bilirubin, UA: NEGATIVE
Blood, UA: NEGATIVE
GLUCOSE UA: NORMAL
KETONES UA: NEGATIVE
LEUKOCYTES UA: NEGATIVE
Nitrite, UA: NEGATIVE
PROTEIN UA: NEGATIVE
Urobilinogen, UA: NEGATIVE
pH, UA: 8.5

## 2015-08-04 NOTE — Progress Notes (Signed)
Subjective:    Silvanna Ohmer is a 24 y.o. female being seen today for her obstetrical visit. She is at [redacted]w[redacted]d gestation. Patient reports backache, no leaking and contractions, patient states she is having mucous plug come out. Fetal movement: normal.  Problem List Items Addressed This Visit    None    Visit Diagnoses    Encounter for supervision of other normal pregnancy in third trimester    -  Primary    Relevant Orders    POCT urinalysis dipstick (Completed)      Patient Active Problem List   Diagnosis Date Noted  . Normal labor 08/03/2015  . Cervical insufficiency during pregnancy in first trimester, antepartum   . Encounter for fetal anatomic survey   . Chlamydia trachomatis infection 06/12/2014  . STD (sexually transmitted disease)     Objective:    BP 121/77 mmHg  Pulse 97  Temp(Src) 97.8 F (36.6 C)  Wt 208 lb (94.348 kg)  LMP 11/06/2014 (Approximate) FHT: 135 BPM  Uterine Size: size equals dates  Presentations: cephalic  Pelvic Exam:              Dilation: 3cm       Effacement: 50%             Station:  -3    Consistency: soft            Position: posterior   NST: reactive, Cat. 1 tracing.  Contractions every 4 minutes moderate intensity.  + accels, no decelerations, Moderate variability.    Assessment:    Pregnancy @ [redacted]w[redacted]d weeks   Reactive NST Regular Contractions without change in dilation  Plan:   Plans for delivery: Vaginal anticipated; labs reviewed; problem list updated Counseling: Consent signed. Infant feeding: plans to breastfeed. Cigarette smoking: never smoked. L&D discussion: symptoms of labor, discussed when to call, discussed what number to call, anesthetic/analgesic options reviewed and delivering clinician:  plans Certified Nurse-Midwife. Postpartum supports and preparation: circumcision discussed and contraception plans discussed.  Follow up Tuesday for IOL.

## 2015-08-06 NOTE — Addendum Note (Signed)
Addended by: Samantha Crimes on: 08/06/2015 04:20 PM   Modules accepted: Orders

## 2015-08-07 ENCOUNTER — Inpatient Hospital Stay (HOSPITAL_COMMUNITY)
Admission: AD | Admit: 2015-08-07 | Discharge: 2015-08-07 | Disposition: A | Discharge status: Home / Self Care | Payer: Medicaid Other | Source: Ambulatory Visit | Attending: Obstetrics | Admitting: Obstetrics

## 2015-08-07 ENCOUNTER — Encounter (HOSPITAL_COMMUNITY): Payer: Self-pay | Admitting: *Deleted

## 2015-08-07 NOTE — Discharge Instructions (Signed)

## 2015-08-07 NOTE — MAU Note (Signed)
Urine in lab 

## 2015-08-07 NOTE — MAU Note (Signed)
Pt states here for abdominal pain. Has been having irregular contractions for past 2 weeks, and lost mucous plug. Was 3.5-4 in office Friday. No gush of fluid.

## 2015-08-08 ENCOUNTER — Encounter: Payer: Medicaid Other | Admitting: Certified Nurse Midwife

## 2015-08-08 ENCOUNTER — Inpatient Hospital Stay (HOSPITAL_COMMUNITY)
Admission: AD | Admit: 2015-08-08 | Discharge: 2015-08-11 | DRG: 775 | Disposition: A | Discharge status: Home / Self Care | Payer: Medicaid Other | Source: Ambulatory Visit | Attending: Obstetrics | Admitting: Obstetrics

## 2015-08-08 ENCOUNTER — Encounter (HOSPITAL_COMMUNITY): Payer: Self-pay | Admitting: *Deleted

## 2015-08-08 DIAGNOSIS — J45909 Unspecified asthma, uncomplicated: Secondary | ICD-10-CM | POA: Diagnosis present

## 2015-08-08 DIAGNOSIS — F329 Major depressive disorder, single episode, unspecified: Secondary | ICD-10-CM | POA: Diagnosis present

## 2015-08-08 DIAGNOSIS — F419 Anxiety disorder, unspecified: Secondary | ICD-10-CM | POA: Diagnosis present

## 2015-08-08 DIAGNOSIS — O99344 Other mental disorders complicating childbirth: Secondary | ICD-10-CM | POA: Diagnosis present

## 2015-08-08 DIAGNOSIS — O9952 Diseases of the respiratory system complicating childbirth: Secondary | ICD-10-CM | POA: Diagnosis present

## 2015-08-08 DIAGNOSIS — Z87891 Personal history of nicotine dependence: Secondary | ICD-10-CM | POA: Diagnosis not present

## 2015-08-08 DIAGNOSIS — O9902 Anemia complicating childbirth: Secondary | ICD-10-CM | POA: Diagnosis present

## 2015-08-08 DIAGNOSIS — Z3490 Encounter for supervision of normal pregnancy, unspecified, unspecified trimester: Secondary | ICD-10-CM

## 2015-08-08 LAB — CBC
HEMATOCRIT: 31.7 % — AB (ref 36.0–46.0)
Hemoglobin: 9.8 g/dL — ABNORMAL LOW (ref 12.0–15.0)
MCH: 27.1 pg (ref 26.0–34.0)
MCHC: 30.9 g/dL (ref 30.0–36.0)
MCV: 87.8 fL (ref 78.0–100.0)
PLATELETS: 254 10*3/uL (ref 150–400)
RBC: 3.61 MIL/uL — ABNORMAL LOW (ref 3.87–5.11)
RDW: 14 % (ref 11.5–15.5)
WBC: 7.2 10*3/uL (ref 4.0–10.5)

## 2015-08-08 LAB — TYPE AND SCREEN
ABO/RH(D): O POS
ANTIBODY SCREEN: NEGATIVE

## 2015-08-08 MED ORDER — ZOLPIDEM TARTRATE 5 MG PO TABS
5.0000 mg | ORAL_TABLET | Freq: Every day | ORAL | Status: DC
  Administered 2015-08-09: 5 mg via ORAL
  Filled 2015-08-08: qty 1

## 2015-08-08 MED ORDER — LACTATED RINGERS IV SOLN
INTRAVENOUS | Status: DC
  Administered 2015-08-09: 02:00:00 via INTRAVENOUS
  Administered 2015-08-09: 300 mL/h via INTRAVENOUS

## 2015-08-08 MED ORDER — ACETAMINOPHEN 325 MG PO TABS: 650.0000 mg | ORAL_TABLET | ORAL | Status: DC | PRN

## 2015-08-08 MED ORDER — MISOPROSTOL 200 MCG PO TABS
50.0000 ug | ORAL_TABLET | Freq: Four times a day (QID) | ORAL | Status: DC
  Administered 2015-08-08 (×2): 50 ug via ORAL
  Filled 2015-08-08 (×2): qty 0.5

## 2015-08-08 MED ORDER — OXYTOCIN BOLUS FROM INFUSION
500.0000 mL | INTRAVENOUS | Status: DC
  Administered 2015-08-09: 500 mL via INTRAVENOUS

## 2015-08-08 MED ORDER — OXYCODONE-ACETAMINOPHEN 5-325 MG PO TABS: 1.0000 | ORAL_TABLET | ORAL | Status: DC | PRN

## 2015-08-08 MED ORDER — FENTANYL CITRATE (PF) 100 MCG/2ML IJ SOLN
100.0000 ug | INTRAMUSCULAR | Status: DC | PRN
  Administered 2015-08-08 – 2015-08-09 (×8): 100 ug via INTRAVENOUS
  Filled 2015-08-08 (×10): qty 2

## 2015-08-08 MED ORDER — OXYCODONE-ACETAMINOPHEN 5-325 MG PO TABS
2.0000 | ORAL_TABLET | ORAL | Status: DC | PRN
  Filled 2015-08-08: qty 2

## 2015-08-08 MED ORDER — LIDOCAINE HCL (PF) 1 % IJ SOLN
30.0000 mL | INTRAMUSCULAR | Status: DC | PRN
  Filled 2015-08-08: qty 30

## 2015-08-08 MED ORDER — OXYTOCIN 40 UNITS IN LACTATED RINGERS INFUSION - SIMPLE MED: 62.5000 mL/h | INTRAVENOUS | Status: DC

## 2015-08-08 MED ORDER — TERBUTALINE SULFATE 1 MG/ML IJ SOLN
0.2500 mg | Freq: Once | INTRAMUSCULAR | Status: DC | PRN
  Filled 2015-08-08: qty 1

## 2015-08-08 MED ORDER — CITRIC ACID-SODIUM CITRATE 334-500 MG/5ML PO SOLN: 30.0000 mL | ORAL | Status: DC | PRN

## 2015-08-08 MED ORDER — LACTATED RINGERS IV SOLN
500.0000 mL | INTRAVENOUS | Status: DC | PRN
  Administered 2015-08-09: 500 mL via INTRAVENOUS

## 2015-08-08 MED ORDER — FLEET ENEMA 7-19 GM/118ML RE ENEM: 1.0000 | ENEMA | RECTAL | Status: DC | PRN

## 2015-08-08 MED ORDER — OXYTOCIN 40 UNITS IN LACTATED RINGERS INFUSION - SIMPLE MED
1.0000 m[IU]/min | INTRAVENOUS | Status: DC
  Administered 2015-08-09: 2 m[IU]/min via INTRAVENOUS
  Filled 2015-08-08: qty 1000

## 2015-08-08 MED ORDER — ONDANSETRON HCL 4 MG/2ML IJ SOLN: 4.0000 mg | Freq: Four times a day (QID) | INTRAMUSCULAR | Status: DC | PRN

## 2015-08-08 NOTE — Progress Notes (Signed)
Denney, CNM notified of pt's status. Updated on uc pattern, FHR,  SVE and RN cannot administer Cytotec due to pt's uc's 2-4 min. States she will come over to give Cytotec.

## 2015-08-08 NOTE — H&P (Signed)
Lauren Conway is a 24 y.o. female presenting for elective IOL at term. History OB History    Gravida Para Term Preterm AB TAB SAB Ectopic Multiple Living   Past Medical History  Diagnosis Date  . History of gonorrhea     12/2008; 09/2010;   . History of chlamydia     12/2008; 03/09/2009  . Asthma   . Anxiety   . Depression    Past Surgical History  Procedure Laterality Date  . No past surgeries     Family History: family history includes Asthma in her maternal grandmother; Hypertension in her mother. There is no history of Other. Social History:  reports that she has quit smoking. She has never used smokeless tobacco. She reports that she does not drink alcohol or use illicit drugs.   Prenatal Transfer Tool  Maternal Diabetes: No Genetic Screening: Normal Maternal Ultrasounds/Referrals: Normal Fetal Ultrasounds or other Referrals:  None Maternal Substance Abuse:  No Significant Maternal Medications:  None Significant Maternal Lab Results:  None Other Comments:  None  ROS Fetal position: OA Dilation: 3.5 Effacement (%): 50 Station: Ballotable Exam by:: Denney, CNM Last menstrual period 11/06/2014. Exam Physical Exam  Prenatal labs: ABO, Rh: --/--/O POS (08/18 0752) Antibody: NEG (08/18 0752) Rubella:   RPR: Non Reactive (08/18 0752)  HBsAg:    HIV: NONREACTIVE (05/18 1023)  GBS: NOT DETECTED (07/27 1650)   Assessment/Plan: Elective IOL at term.  Fetal position: OA. Cytotec PO.  Epidural once in good labor pattern with cervical dilation.     Rachelle A Denney 08/08/2015, 9:07 AM

## 2015-08-09 ENCOUNTER — Inpatient Hospital Stay (HOSPITAL_COMMUNITY): Payer: Medicaid Other | Admitting: Anesthesiology

## 2015-08-09 ENCOUNTER — Encounter: Payer: Medicaid Other | Admitting: Certified Nurse Midwife

## 2015-08-09 ENCOUNTER — Encounter (HOSPITAL_COMMUNITY): Payer: Self-pay | Admitting: *Deleted

## 2015-08-09 LAB — RPR: RPR: NONREACTIVE

## 2015-08-09 MED ORDER — ONDANSETRON HCL 4 MG/2ML IJ SOLN: 4.0000 mg | INTRAMUSCULAR | Status: DC | PRN

## 2015-08-09 MED ORDER — SENNOSIDES-DOCUSATE SODIUM 8.6-50 MG PO TABS
2.0000 | ORAL_TABLET | ORAL | Status: DC
  Administered 2015-08-10: 2 via ORAL
  Filled 2015-08-09: qty 2

## 2015-08-09 MED ORDER — LANOLIN HYDROUS EX OINT: TOPICAL_OINTMENT | CUTANEOUS | Status: DC | PRN

## 2015-08-09 MED ORDER — DIBUCAINE 1 % RE OINT: 1.0000 "application " | TOPICAL_OINTMENT | RECTAL | Status: DC | PRN

## 2015-08-09 MED ORDER — IBUPROFEN 600 MG PO TABS
600.0000 mg | ORAL_TABLET | Freq: Four times a day (QID) | ORAL | Status: DC
  Administered 2015-08-09 – 2015-08-11 (×8): 600 mg via ORAL
  Filled 2015-08-09 (×8): qty 1

## 2015-08-09 MED ORDER — EPHEDRINE 5 MG/ML INJ
10.0000 mg | INTRAVENOUS | Status: DC | PRN
  Filled 2015-08-09: qty 2

## 2015-08-09 MED ORDER — BENZOCAINE-MENTHOL 20-0.5 % EX AERO
1.0000 "application " | INHALATION_SPRAY | CUTANEOUS | Status: DC | PRN
  Administered 2015-08-09 – 2015-08-11 (×2): 1 via TOPICAL
  Filled 2015-08-09: qty 56

## 2015-08-09 MED ORDER — LIDOCAINE HCL (PF) 1 % IJ SOLN
INTRAMUSCULAR | Status: DC | PRN
  Administered 2015-08-09: 6 mL via EPIDURAL
  Administered 2015-08-09: 4 mL

## 2015-08-09 MED ORDER — DIPHENHYDRAMINE HCL 50 MG/ML IJ SOLN: 12.5000 mg | INTRAMUSCULAR | Status: DC | PRN

## 2015-08-09 MED ORDER — ONDANSETRON HCL 4 MG PO TABS: 4.0000 mg | ORAL_TABLET | ORAL | Status: DC | PRN

## 2015-08-09 MED ORDER — FENTANYL 2.5 MCG/ML BUPIVACAINE 1/10 % EPIDURAL INFUSION (WH - ANES): 14.0000 mL/h | INTRAMUSCULAR | Status: DC | PRN

## 2015-08-09 MED ORDER — BENZOCAINE-MENTHOL 20-0.5 % EX AERO
INHALATION_SPRAY | CUTANEOUS | Status: AC
  Filled 2015-08-09: qty 56

## 2015-08-09 MED ORDER — ALBUTEROL SULFATE (2.5 MG/3ML) 0.083% IN NEBU: 3.0000 mL | INHALATION_SOLUTION | Freq: Four times a day (QID) | RESPIRATORY_TRACT | Status: DC | PRN

## 2015-08-09 MED ORDER — PRENATAL MULTIVITAMIN CH
1.0000 | ORAL_TABLET | Freq: Every day | ORAL | Status: DC
  Administered 2015-08-10 – 2015-08-11 (×2): 1 via ORAL
  Filled 2015-08-09 (×2): qty 1

## 2015-08-09 MED ORDER — DIPHENHYDRAMINE HCL 25 MG PO CAPS: 25.0000 mg | ORAL_CAPSULE | Freq: Four times a day (QID) | ORAL | Status: DC | PRN

## 2015-08-09 MED ORDER — ZOLPIDEM TARTRATE 5 MG PO TABS: 5.0000 mg | ORAL_TABLET | Freq: Every evening | ORAL | Status: DC | PRN

## 2015-08-09 MED ORDER — TETANUS-DIPHTH-ACELL PERTUSSIS 5-2.5-18.5 LF-MCG/0.5 IM SUSP: 0.5000 mL | Freq: Once | INTRAMUSCULAR | Status: DC

## 2015-08-09 MED ORDER — ACETAMINOPHEN 325 MG PO TABS: 650.0000 mg | ORAL_TABLET | ORAL | Status: DC | PRN

## 2015-08-09 MED ORDER — SIMETHICONE 80 MG PO CHEW: 80.0000 mg | CHEWABLE_TABLET | ORAL | Status: DC | PRN

## 2015-08-09 MED ORDER — OXYCODONE-ACETAMINOPHEN 5-325 MG PO TABS
2.0000 | ORAL_TABLET | ORAL | Status: DC | PRN
  Administered 2015-08-09 – 2015-08-10 (×6): 2 via ORAL
  Filled 2015-08-09 (×5): qty 2

## 2015-08-09 MED ORDER — PRENATAL MULTIVITAMIN CH: 1.0000 | ORAL_TABLET | Freq: Every day | ORAL | Status: DC

## 2015-08-09 MED ORDER — BUPROPION HCL ER (SR) 100 MG PO TB12
100.0000 mg | ORAL_TABLET | Freq: Two times a day (BID) | ORAL | Status: DC
  Administered 2015-08-09 – 2015-08-11 (×4): 100 mg via ORAL
  Filled 2015-08-09 (×6): qty 1

## 2015-08-09 MED ORDER — OXYTOCIN 40 UNITS IN LACTATED RINGERS INFUSION - SIMPLE MED: 62.5000 mL/h | INTRAVENOUS | Status: DC | PRN

## 2015-08-09 MED ORDER — LIDOCAINE HCL 2 % EX GEL
1.0000 "application " | Freq: Once | CUTANEOUS | Status: AC
  Administered 2015-08-09: 1 via URETHRAL
  Filled 2015-08-09: qty 5

## 2015-08-09 MED ORDER — OXYTOCIN 40 UNITS IN LACTATED RINGERS INFUSION - SIMPLE MED: 1.0000 m[IU]/min | INTRAVENOUS | Status: DC

## 2015-08-09 MED ORDER — WITCH HAZEL-GLYCERIN EX PADS: 1.0000 "application " | MEDICATED_PAD | CUTANEOUS | Status: DC | PRN

## 2015-08-09 MED ORDER — PHENYLEPHRINE 40 MCG/ML (10ML) SYRINGE FOR IV PUSH (FOR BLOOD PRESSURE SUPPORT)
80.0000 ug | PREFILLED_SYRINGE | INTRAVENOUS | Status: DC | PRN
  Filled 2015-08-09: qty 2
  Filled 2015-08-09: qty 20

## 2015-08-09 MED ORDER — OXYCODONE-ACETAMINOPHEN 5-325 MG PO TABS
1.0000 | ORAL_TABLET | ORAL | Status: DC | PRN
  Administered 2015-08-11: 1 via ORAL
  Filled 2015-08-09: qty 1

## 2015-08-09 MED ORDER — FENTANYL 2.5 MCG/ML BUPIVACAINE 1/10 % EPIDURAL INFUSION (WH - ANES)
14.0000 mL/h | INTRAMUSCULAR | Status: DC | PRN
Start: 2015-08-09 — End: 2015-08-09
  Administered 2015-08-09: 14 mL/h via EPIDURAL
  Filled 2015-08-09: qty 125

## 2015-08-09 NOTE — Anesthesia Preprocedure Evaluation (Signed)
Anesthesia Evaluation  Patient identified by MRN, date of birth, ID band Patient awake    Reviewed: Allergy & Precautions, H&P , Patient's Chart, lab work & pertinent test results  Airway Mallampati: II  TM Distance: >3 FB Neck ROM: full    Dental  (+) Teeth Intact   Pulmonary asthma , former smoker,  breath sounds clear to auscultation        Cardiovascular Rhythm:regular Rate:Normal     Neuro/Psych    GI/Hepatic   Endo/Other    Renal/GU      Musculoskeletal   Abdominal   Peds  Hematology  (+) anemia ,   Anesthesia Other Findings       Reproductive/Obstetrics (+) Pregnancy                             Anesthesia Physical Anesthesia Plan  ASA: II  Anesthesia Plan: Epidural   Post-op Pain Management:    Induction:   Airway Management Planned:   Additional Equipment:   Intra-op Plan:   Post-operative Plan:   Informed Consent: I have reviewed the patients History and Physical, chart, labs and discussed the procedure including the risks, benefits and alternatives for the proposed anesthesia with the patient or authorized representative who has indicated his/her understanding and acceptance.   Dental Advisory Given  Plan Discussed with:   Anesthesia Plan Comments: (Labs checked- platelets confirmed with RN in room. Fetal heart tracing, per RN, reported to be stable enough for sitting procedure. Discussed epidural, and patient consents to the procedure:  included risk of possible headache,backache, failed block, allergic reaction, and nerve injury. This patient was asked if she had any questions or concerns before the procedure started.)        Anesthesia Quick Evaluation

## 2015-08-09 NOTE — Anesthesia Procedure Notes (Signed)
Epidural Patient location during procedure: OB  Preanesthetic Checklist Completed: patient identified, site marked, surgical consent, pre-op evaluation, timeout performed, IV checked, risks and benefits discussed and monitors and equipment checked  Epidural Patient position: sitting Prep: site prepped and draped and DuraPrep Patient monitoring: continuous pulse ox and blood pressure Approach: midline Location: L3-L4 Injection technique: LOR air  Needle:  Needle type: Tuohy  Needle gauge: 17 G Needle length: 9 cm and 9 Needle insertion depth: 6 cm Catheter type: closed end flexible Catheter size: 19 Gauge Catheter at skin depth: 12 cm Test dose: negative  Assessment Events: blood not aspirated, injection not painful, no injection resistance, negative IV test and no paresthesia  Additional Notes Dosing of Epidural:  1st dose, through catheter .............................................  Xylocaine 40 mg  2nd dose, through catheter, after waiting 3 minutes.........Xylocaine 60 mg    As each dose occurred, patient was free of IV sx; and patient exhibited no evidence of SA injection.  Patient is more comfortable after epidural dosed. Please see RN's note for documentation of vital signs,and FHR which are stable.  Patient reminded not to try to ambulate with numb legs, and that an RN must be present when she attempts to get up.       

## 2015-08-09 NOTE — Progress Notes (Signed)
MOB was referred for history of depression/anxiety.  Referral is screened out by Clinical Social Worker because none of the following criteria appear to apply: -History of anxiety/depression during this pregnancy, or of post-partum depression. - Diagnosis of anxiety and/or depression within last 3 years - History of depression due to pregnancy loss/loss of child or -MOB's symptoms are currently being treated with medication and/or therapy.  Please contact the Clinical Social Worker if needs arise or upon MOB request.  

## 2015-08-10 LAB — CBC
HEMATOCRIT: 26.4 % — AB (ref 36.0–46.0)
Hemoglobin: 8.5 g/dL — ABNORMAL LOW (ref 12.0–15.0)
MCH: 28 pg (ref 26.0–34.0)
MCHC: 32.2 g/dL (ref 30.0–36.0)
MCV: 86.8 fL (ref 78.0–100.0)
Platelets: 228 10*3/uL (ref 150–400)
RBC: 3.04 MIL/uL — ABNORMAL LOW (ref 3.87–5.11)
RDW: 14 % (ref 11.5–15.5)
WBC: 11 10*3/uL — ABNORMAL HIGH (ref 4.0–10.5)

## 2015-08-10 MED ORDER — FERROUS SULFATE 325 (65 FE) MG PO TABS
325.0000 mg | ORAL_TABLET | Freq: Three times a day (TID) | ORAL | Status: DC
  Administered 2015-08-10 – 2015-08-11 (×3): 325 mg via ORAL
  Filled 2015-08-10 (×4): qty 1

## 2015-08-10 NOTE — Lactation Note (Signed)
This note was copied from the chart of Lauren Conway. Lactation Consultation Note  Patient Name: Lauren Conway [id number] Date: 08/10/2015 Reason for consult: Initial assessment   With this mom and term baby, now 14 hours old. Mom is breast and formula feeding. She was interested in learning how to latch baby deeper. I showed her how to latch in football hold, and to bring the baby to her breast, not her breast to the baby. The baby latched well, with some good suckles, but then he fel asleep. I reviewed lactation services with mom, and did breastfeeding teaching from the baby and me book. Mom knows to call for questions/concerns   Maternal Data Formula Feeding for Exclusion: Yes Reason for exclusion: Mother's choice to formula and breast feed on admission Has patient been taught Hand Expression?: Yes Does the patient have breastfeeding experience prior to this delivery?: Yes  Feeding Feeding Type: Breast Fed Length of feed: 5 min (on and off sucking - sleepy)  LATCH Score/Interventions Latch: Repeated attempts needed to sustain latch, nipple held in mouth throughout feeding, stimulation needed to elicit sucking reflex. Intervention(s): Adjust position;Assist with latch;Breast massage;Breast compression  Audible Swallowing: A few with stimulation (easily expressed colostrum) Intervention(s): Skin to skin;Hand expression  Type of Nipple: Everted at rest and after stimulation  Comfort (Breast/Nipple): Soft / non-tender     Hold (Positioning): Assistance needed to correctly position infant at breast and maintain latch.  LATCH Score: 7  Lactation Tools Discussed/Used     Consult Status Consult Status: Follow-up Date: 08/11/15 Follow-up type: In-patient    Alfred Levins 08/10/2015, 5:33 PM

## 2015-08-10 NOTE — Progress Notes (Signed)
UR chart review completed.  

## 2015-08-10 NOTE — Anesthesia Postprocedure Evaluation (Signed)
Anesthesia Post Note  Patient: Lauren Conway  Procedure(s) Performed: * No procedures listed *  Anesthesia type: Epidural  Patient location: Mother/Baby  Post pain: Pain level controlled  Post assessment: Post-op Vital signs reviewed  Last Vitals:  Filed Vitals:   08/10/15 0432  BP: 105/50  Pulse: 62  Temp: 36.7 C  Resp: 18    Post vital signs: Reviewed  Level of consciousness: awake  Complications: No apparent anesthesia complications

## 2015-08-10 NOTE — Progress Notes (Addendum)
Post Partum Day #1 Subjective: no complaints, up ad lib, voiding and tolerating PO.  Breastfeeding.   Objective: Blood pressure 105/50, pulse 62, temperature 98.1 F (36.7 C), temperature source Oral, resp. rate 18, height  (1.626 m), weight 209 lb (94.802 kg), last menstrual period 11/06/2014, SpO2 99 %, unknown if currently breastfeeding.  Physical Exam:  General: alert, cooperative and no distress Lochia: appropriate Uterine Fundus: firm Incision: healing well DVT Evaluation: No evidence of DVT seen on physical exam. Negative Homan's sign. No cords or calf tenderness.   Recent Labs  08/08/15 0930 08/10/15 0520  HGB 9.8* 8.5*  HCT 31.7* 26.4*    Assessment/Plan: Anemic Plan for discharge tomorrow, Breastfeeding and Lactation consult.  Start Iron supplementation.    LOS: 2 days   Valentine Barney A Rosary Filosa 08/10/2015, 9:20 AM

## 2015-08-11 MED ORDER — SENNOSIDES-DOCUSATE SODIUM 8.6-50 MG PO TABS: 2.0000 | ORAL_TABLET | ORAL | Status: DC

## 2015-08-11 MED ORDER — OXYCODONE-ACETAMINOPHEN 5-325 MG PO TABS: 2.0000 | ORAL_TABLET | ORAL | Status: DC | PRN

## 2015-08-11 MED ORDER — FERIVA 21/7 75-1 MG PO TABS: 1.0000 | ORAL_TABLET | Freq: Every day | ORAL | Status: DC

## 2015-08-11 MED ORDER — IBUPROFEN 800 MG PO TABS: 800.0000 mg | ORAL_TABLET | Freq: Three times a day (TID) | ORAL | Status: DC | PRN

## 2015-08-11 MED ORDER — WITCH HAZEL-GLYCERIN EX PADS: 1.0000 "application " | MEDICATED_PAD | CUTANEOUS | Status: DC | PRN

## 2015-08-11 MED ORDER — DIBUCAINE 1 % RE OINT: 1.0000 "application " | TOPICAL_OINTMENT | RECTAL | Status: DC | PRN

## 2015-08-11 NOTE — Progress Notes (Signed)
Post Partum Day #2 Subjective: no complaints, up ad lib, voiding, tolerating PO and + flatus  Objective: Blood pressure 128/67, pulse 89, temperature 98 F (36.7 C), temperature source Oral, resp. rate 18, height  (1.626 m), weight 209 lb (94.802 kg), last menstrual period 11/06/2014, SpO2 99 %, unknown if currently breastfeeding.  Physical Exam:  General: alert, cooperative and no distress Lochia: appropriate Uterine Fundus: firm Incision: none DVT Evaluation: No evidence of DVT seen on physical exam. Calf/Ankle edema is present.   Recent Labs  08/08/15 0930 08/10/15 0520  HGB 9.8* 8.5*  HCT 31.7* 26.4*    Assessment/Plan: Discharge home, Breastfeeding and Lactation consult   LOS: 3 days   Tavarious Freel A Kinnick Maus 08/11/2015, 8:28 AM

## 2015-08-11 NOTE — Discharge Summary (Signed)
Obstetric Discharge Summary Reason for Admission: induction of labor Prenatal Procedures: none Intrapartum Procedures: spontaneous vaginal delivery Postpartum Procedures: none Complications-Operative and Postpartum: none  HEMOGLOBIN  Date Value Ref Range Status  08/10/2015 8.5* 12.0 - 15.0 g/dL Final   HCT  Date Value Ref Range Status  08/10/2015 26.4* 36.0 - 46.0 % Final    Physical Exam:  General: alert, cooperative and no distress Lochia: appropriate Uterine: firm Incision: N/A DVT Evaluation: No evidence of DVT seen on physical exam. Calf/Ankle edema is present.  Discharge Diagnoses: Active Problems:   Term pregnancy   NSVD (normal spontaneous vaginal delivery)   Discharge Information: Date: 08/11/2015 Activity: pelvic rest Diet: routine Medications:  Prior to Admission medications   Medication Sig Start Date End Date Taking? Authorizing Provider  albuterol (PROVENTIL HFA;VENTOLIN HFA) 108 (90 BASE) MCG/ACT inhaler Inhale 2 puffs into the lungs every 6 (six) hours as needed for wheezing or shortness of breath. 04/06/15  Yes Hinley Brimage A Haillee Johann, CNM  buPROPion (WELLBUTRIN SR) 100 MG 12 hr tablet Take 1 tablet (100 mg total) by mouth 2 (two) times daily. 07/12/15  Yes Leilanni Halvorson Sindy Messing, CNM  Prenatal Vit-Fe Fumarate-FA (PRENATAL MULTIVITAMIN) TABS tablet Take 1 tablet by mouth daily.    Yes Historical Provider, MD  dibucaine (NUPERCAINAL) 1 % OINT Place 1 application rectally as needed for hemorrhoids. 08/11/15   Micaylah Bertucci A Deirdre Gryder, CNM  FeAsp-B12-FA-C-DSS-SuccAc-Zn (FERIVA 21/7) 75-1 MG TABS Take 1 tablet by mouth daily. 08/11/15   Cniyah Sproull A Skylor Hughson, CNM  ibuprofen (ADVIL,MOTRIN) 800 MG tablet Take 1 tablet (800 mg total) by mouth every 8 (eight) hours as needed. 08/11/15   Roe Coombs, CNM  oxyCODONE-acetaminophen (PERCOCET/ROXICET) 5-325 MG per tablet Take 2 tablets by mouth every 4 (four) hours as needed (for pain scale greater than 7). 08/11/15   Olaoluwa Grieder A Nautika Cressey, CNM   senna-docusate (SENOKOT-S) 8.6-50 MG per tablet Take 2 tablets by mouth daily. 08/11/15   Roe Coombs, CNM  witch hazel-glycerin (TUCKS) pad Apply 1 application topically as needed for hemorrhoids. 08/11/15   Roe Coombs, CNM    Condition: stable Instructions: refer to routine discharge instructions Discharge to: home   Newborn Data:  Live born female  Birth Weight: 7 lb 11.6 oz (3504 g) APGAR: 7,    Home with mother.  Aracelly Tencza A Moksha Dorgan 08/11/2015, 5:14 PM

## 2015-08-11 NOTE — Discharge Instructions (Signed)
Schedule circumcision for baby at [facility] late next week/early following week.  Also, schedule 2 week postpartum visit.    Postpartum Care After Vaginal Delivery After you deliver your baby, you will stay in the hospital for 24 to 72 hours, unless there were problems with the labor or delivery, or you have medical problems. While you are in the hospital, you will receive help and instructions on how to care for yourself and your baby. Your doctor will order pain medicine, in case you need it. You will have a small amount of bleeding from your vagina and should change your sanitary pad frequently. Wash your hands thoroughly with soap and water for at least 20 seconds after changing pads and using the toilet. Let the nurses know if you begin to pass blood clots or your bleeding increases. Do not flush blood clots down the toilet before having the nurse look at them, to make sure there is no placental tissue with them. If you had an intravenous, it will be removed within 24 hours, if there are no problems. The first time you get out of bed or take a shower, call the nurse to help you because you may get weak, lightheaded, or even faint. If you are breastfeeding, you may feel painful contractions of your uterus for a couple of weeks. This is normal. The contractions help your uterus get back to normal size. If you are not breastfeeding, wear a tight, binding bra and decrease your fluid intake. You may be given a medicine to dry up the milk in your breasts. Hormones should not be given to dry up the breasts, because they can cause blood clots. You will be given your normal diet, unless you have diabetes or other medical problems.  The nurses may put an ice pack on your episiotomy (surgically enlarged opening), if you have one, to reduce the pain and puffiness (swelling). On rare occasions, you may not be able to urinate and the nurse will need to empty your bladder with a catheter. If you had a  postpartum tubal ligation ("tying tubes," female sterilization), it should not make your stay in the hospital longer. You may have your baby in your room with you as much as you like, unless you or the baby has a problem. Use the bassinet (basket) for the baby when going to and from the nursery. Do not carry the baby. Do not leave the postpartum area. If the mother is Rh negative (lacks a protein on the red blood cells) and the baby is Rh positive, the mother should get a Rho-gam shot to prevent Rh problems with future pregnancies. You may be given written instructions for you and your baby, and necessary medicines, when you are discharged from the hospital. Be sure you understand and follow the instructions as advised. HOME CARE INSTRUCTIONS AFTER YOUR DELIVERY:  Follow instructions and take the medicines given to you.   Only take over-the-counter or prescription medicines for pain, discomfort, or fever as directed by your caregiver.   Do not take aspirin, because it can cause bleeding.   Increase your activities a little bit every day to build up your strength and endurance.   Do not drink alcohol, especially if you are breastfeeding or taking pain medicine.   Take your temperature twice a day and record it.   You may have a small amount of bleeding or spotting for 2 to 4 weeks. This is normal.   Do not use tampons or douche.  Use sanitary pads.   Try to have someone stay and help you for a few days when you go home.   Try to rest or take a nap when the baby is sleeping.   If you are breastfeeding, wear a good support bra. If you are not breastfeeding, wear a tight bra, do not stimulate your nipples, and decrease your fluid intake.   Eat a healthy, nutritious diet and continue to take your prenatal vitamins.   Do not drive, do any heavy activities or travel until your caregiver tells you it is OK.   Do not have intercourse until your caregiver gives you permission to do so.   Ask  your caregiver when you can begin to exercise and what type of exercises to do.   Call your caregiver if you think you are having a problem from your delivery.   Call your pediatrician if you are having a problem with the baby.   Schedule your postpartum visit and keep it.  SEEK MEDICAL CARE IF:  You have a temperature of 100 F (37.8 C) or higher.   You have increased vaginal bleeding or are passing clots. Save any clots to show your caregiver.   You have bloody urine, or pain when you urinate.   You have a bad smelling vaginal discharge.   You have increasing pain or swelling on your episiotomy (surgically enlarged opening).   You develop a severe headache.   You feel depressed.   The episiotomy is separating.   You become dizzy or lightheaded.   You develop a rash.   You have a reaction or problems with your medicine.   You have pain, redness, and/or swelling at the intravenous site.  SEEK IMMEDIATE MEDICAL CARE IF:  You have chest pain.   You develop shortness of breath.   You pass out.   You develop pain, with or without swelling or redness in your leg.   You develop heavy vaginal bleeding, with or without blood clots.   You develop stomach pain.   You develop a bad smelling vaginal discharge.  MAKE SURE YOU:   Understand these instructions.   Will watch your condition.   Will get help right away if you are not doing well or get worse.  Document Released: 09/29/2007 Document Re-Released: 11/20/2009 [facility] Patient Information 2011 [place], [place].

## 2015-08-15 ENCOUNTER — Encounter: Payer: Medicaid Other | Admitting: Certified Nurse Midwife

## 2015-08-16 ENCOUNTER — Encounter: Payer: Medicaid Other | Admitting: Certified Nurse Midwife

## 2015-08-18 ENCOUNTER — Encounter (HOSPITAL_COMMUNITY): Payer: Self-pay | Admitting: *Deleted

## 2015-08-24 ENCOUNTER — Ambulatory Visit: Payer: Medicaid Other | Admitting: Certified Nurse Midwife

## 2015-08-25 ENCOUNTER — Ambulatory Visit: Payer: Medicaid Other | Admitting: Certified Nurse Midwife

## 2015-09-01 ENCOUNTER — Ambulatory Visit: Payer: Medicaid Other | Admitting: Certified Nurse Midwife

## 2015-09-01 ENCOUNTER — Encounter: Payer: Self-pay | Admitting: Certified Nurse Midwife

## 2015-09-01 ENCOUNTER — Ambulatory Visit (INDEPENDENT_AMBULATORY_CARE_PROVIDER_SITE_OTHER): Payer: Medicaid Other | Admitting: Certified Nurse Midwife

## 2015-09-01 DIAGNOSIS — K640 First degree hemorrhoids: Secondary | ICD-10-CM

## 2015-09-01 NOTE — Progress Notes (Signed)
Patient ID: Lauren Conway, female   DOB: 09-03-1991, 24 y.o.   MRN: [id number]  Subjective:     Lauren Conway is a 24 y.o. female who presents for a postpartum visit. She is 3 weeks postpartum following a spontaneous vaginal delivery. I have fully reviewed the prenatal and intrapartum course. The delivery was at 39.2 gestational weeks. Outcome: spontaneous vaginal delivery. Anesthesia: epidural. Postpartum course has been normal. 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 Baby is feeding by bottle - Similac Advance. Bleeding no bleeding. Bowel function is normal. Bladder function is normal. Patient is not sexually active. Contraception method is abstinence. Postpartum depression screening: negative.  Tobacco, alcohol and substance abuse history reviewed.  Adult immunizations reviewed including TDAP, rubella and varicella.  The following portions of the patient's history were reviewed and updated as appropriate: allergies, current medications, past family history, past medical history, past social history, past surgical history and problem list.  Review of Systems Gastrointestinal: positive for hemorrhoids   Objective:    BP 132/83 mmHg  Pulse 107  Temp(Src) 98.3 F (36.8 C)  Ht  (1.626 m)  Wt 178 lb (80.74 kg)  BMI 30.54 kg/m2  Breastfeeding? No  General:  alert, cooperative and no distress   Breasts:  inspection negative, no nipple discharge or bleeding, no masses or nodularity palpable  Lungs: clear to auscultation bilaterally  Heart:  regular rate and rhythm, S1, S2 normal, no murmur, click, rub or gallop  Abdomen: soft, non-tender; bowel sounds normal; no masses,  no organomegaly   Vulva:  normal  Vagina: normal vagina, no discharge, exudate, lesion, or erythema  Cervix:  no cervical motion tenderness  Corpus: normal  Adnexa:  not evaluated  Rectal Exam: external hemorrhoids and grade 1, no erythema or bleeding present          50% of 25 min visit spent on  counseling and coordination of care.  Assessment:     Normal 3 week postpartum exam. Pap smear not done at today's visit.  Plan:    1. Contraception: abstinence 2. F/U in 3 weeks with Paraguard IUD 3. Follow up in: 3 weeks or as needed.  2hr GTT for h/o GDM/screening for DM q 3 yrs per ADA recommendations Preconception counseling provided Healthy lifestyle practices reviewed

## 2015-09-05 MED ORDER — HYDROCORTISONE ACETATE 25 MG RE SUPP: 25.0000 mg | Freq: Two times a day (BID) | RECTAL | Status: DC

## 2015-09-05 NOTE — Addendum Note (Signed)
Addended by: Samantha Crimes on: 09/05/2015 10:30 AM   Modules accepted: Orders

## 2015-09-11 ENCOUNTER — Telehealth: Payer: Self-pay | Admitting: *Deleted

## 2015-09-11 NOTE — Telephone Encounter (Signed)
Patient states she was spotting brownish discharge- then on Saturday she started a full flow-red. Sunday she had clots and Monday she started cramping. She wants to know if this is her cycle. She needs to schedule her birth control- Paragard.

## 2015-09-12 NOTE — Telephone Encounter (Signed)
Patient has changed her mind about her birth control and she wants to try the pills. Told her I would let Boykin Reaper know and we would send something over for her.

## 2015-09-12 NOTE — Telephone Encounter (Signed)
Can we get her in this week for her device?  I cannot remember how far out postpartum she is if she is 6 weeks then we could get her paraguard in this week?  She maybe having a cycle since she is not breastfeeding anymore.  Thank you.  R.Denney CNM

## 2015-09-14 ENCOUNTER — Ambulatory Visit (INDEPENDENT_AMBULATORY_CARE_PROVIDER_SITE_OTHER): Payer: Medicaid Other | Admitting: Certified Nurse Midwife

## 2015-09-14 ENCOUNTER — Encounter: Payer: Self-pay | Admitting: Certified Nurse Midwife

## 2015-09-14 VITALS — BP 129/89 | HR 76 | Temp 97.5°F | Wt 180.0 lb

## 2015-09-14 DIAGNOSIS — Z30430 Encounter for insertion of intrauterine contraceptive device: Secondary | ICD-10-CM | POA: Diagnosis not present

## 2015-09-14 NOTE — Progress Notes (Signed)
Patient ID: Lauren Conway, female   DOB: 1991-03-19, 24 y.o.   MRN: [id number]  Subjective:     Lauren Conway is a 24 y.o. female who presents for a postpartum visit. She is 6 weeks postpartum following a spontaneous vaginal delivery. I have fully reviewed the prenatal and intrapartum course. The delivery was at 39 gestational weeks. Outcome: spontaneous vaginal delivery. Anesthesia: epidural. Postpartum course has been normal. Baby's course has been normal. Baby is feeding by bottle - Similac Advance. Bleeding moderate lochia and pink. Bowel function is normal. Bladder function is normal. Patient is not sexually active. Contraception method is abstinence. Postpartum depression screening: negative.  Tobacco, alcohol and substance abuse history reviewed.  Adult immunizations reviewed including TDAP, rubella and varicella.  The following portions of the patient's history were reviewed and updated as appropriate: allergies, current medications, past family history, past medical history, past social history, past surgical history and problem list.  Review of Systems A comprehensive review of systems was negative.   Objective:    BP 129/89 mmHg  Pulse 76  Temp(Src) 97.5 F (36.4 C)  Wt 180 lb (81.647 kg)  LMP 09/09/2015  Breastfeeding? No  General:  alert, cooperative and no distress   Breasts:  inspection negative, no nipple discharge or bleeding, no masses or nodularity palpable  Lungs: clear to auscultation bilaterally  Heart:  regular rate and rhythm, S1, S2 normal, no murmur, click, rub or gallop  Abdomen: soft, non-tender; bowel sounds normal; no masses,  no organomegaly   Vulva:  normal  Vagina: normal vagina  Cervix:  no cervical motion tenderness  Corpus: normal size, contour, position, consistency, mobility, non-tender  Adnexa:  normal adnexa  Rectal Exam: Not performed.          50% of 30 min visit spent on counseling and coordination of care.  Assessment:     Normal 6  week postpartum exam. Pap smear done at today's visit.    IUD placement  Normal menses currently Plan:    1. Contraception: IUD 2. Paraguard placed today 3. Follow up in: 4 weeks for string check or as needed.  2hr GTT for h/o GDM/screening for DM q 3 yrs per ADA recommendations Preconception counseling provided Healthy lifestyle practices reviewed

## 2015-09-14 NOTE — Progress Notes (Signed)
Patient ID: Lauren Conway, female   DOB: 09-30-1991, 24 y.o.   MRN: [id number]  IUD Procedure Note   DIAGNOSIS: Desires long-term, reversible contraception   PROCEDURE: IUD placement Performing Provider: Orvilla Cornwall CNM  Patient counseled prior to procedure. I explained risks and benefits of Paraguard IUD, reviewed alternative forms of contraception. Patient stated understanding and consented to continue with procedure.   LMP: On cycle, not currently sexually active postpartum Pregnancy Test: Negative Lot #: [id number] Expiration Date: Apr 2022   IUD type: [   ] Mirena   [ X ] Paraguard    PROCEDURE:  Timeout procedure was performed to ensure right patient and right site.  A bimanual exam was performed to determine the position of the uterus, retroverted. The speculum was placed. The vagina and cervix was sterilized in the usual manner and sterile technique was maintained throughout the course of the procedure. A single toothed tenaculum was not required. The depth of the uterus was sounded to 9 cm. The IUD was inserted to the appropriate depth and inserted without difficulty.  The string was cut to an estimated 4 cm length. Bleeding was minimal. The patient tolerated the procedure well.   Follow up: The patient tolerated the procedure well without complications.  Standard post-procedure care is explained and return precautions are given.  Orvilla Cornwall CNM

## 2015-09-20 NOTE — Telephone Encounter (Signed)
Pt has been seen in office. Message resolved.

## 2015-09-29 ENCOUNTER — Other Ambulatory Visit: Payer: Self-pay | Admitting: Certified Nurse Midwife

## 2015-10-26 ENCOUNTER — Ambulatory Visit: Payer: Medicaid Other | Admitting: Certified Nurse Midwife

## 2015-10-27 ENCOUNTER — Ambulatory Visit: Payer: Medicaid Other | Admitting: Certified Nurse Midwife

## 2015-11-13 ENCOUNTER — Inpatient Hospital Stay (HOSPITAL_COMMUNITY)
Admission: AD | Admit: 2015-11-13 | Discharge: 2015-11-14 | Disposition: A | Discharge status: Home / Self Care | Payer: Self-pay | Source: Ambulatory Visit | Attending: Obstetrics | Admitting: Obstetrics

## 2015-11-13 DIAGNOSIS — Z3202 Encounter for pregnancy test, result negative: Secondary | ICD-10-CM | POA: Insufficient documentation

## 2015-11-13 DIAGNOSIS — Z87891 Personal history of nicotine dependence: Secondary | ICD-10-CM | POA: Insufficient documentation

## 2015-11-13 DIAGNOSIS — Z202 Contact with and (suspected) exposure to infections with a predominantly sexual mode of transmission: Secondary | ICD-10-CM | POA: Insufficient documentation

## 2015-11-13 DIAGNOSIS — N926 Irregular menstruation, unspecified: Secondary | ICD-10-CM | POA: Insufficient documentation

## 2015-11-14 ENCOUNTER — Encounter (HOSPITAL_COMMUNITY): Payer: Self-pay | Admitting: *Deleted

## 2015-11-14 DIAGNOSIS — N926 Irregular menstruation, unspecified: Secondary | ICD-10-CM

## 2015-11-14 DIAGNOSIS — Z3202 Encounter for pregnancy test, result negative: Secondary | ICD-10-CM

## 2015-11-14 LAB — URINE MICROSCOPIC-ADD ON: RBC / HPF: NONE SEEN RBC/hpf (ref 0–5)

## 2015-11-14 LAB — WET PREP, GENITAL
Clue Cells Wet Prep HPF POC: NONE SEEN
Sperm: NONE SEEN
Trich, Wet Prep: NONE SEEN

## 2015-11-14 LAB — URINALYSIS, ROUTINE W REFLEX MICROSCOPIC
BILIRUBIN URINE: NEGATIVE
Glucose, UA: NEGATIVE mg/dL
Hgb urine dipstick: NEGATIVE
Ketones, ur: NEGATIVE mg/dL
Leukocytes, UA: NEGATIVE
NITRITE: NEGATIVE
Protein, ur: 30 mg/dL — AB
SPECIFIC GRAVITY, URINE: 1.02 (ref 1.005–1.030)
pH: 7 (ref 5.0–8.0)

## 2015-11-14 LAB — HIV ANTIBODY (ROUTINE TESTING W REFLEX): HIV SCREEN 4TH GENERATION: NONREACTIVE

## 2015-11-14 LAB — RPR: RPR: NONREACTIVE

## 2015-11-14 LAB — GC/CHLAMYDIA PROBE AMP (~~LOC~~) NOT AT ARMC
Chlamydia: POSITIVE — AB
NEISSERIA GONORRHEA: NEGATIVE

## 2015-11-14 LAB — POCT PREGNANCY, URINE: PREG TEST UR: NEGATIVE

## 2015-11-14 MED ORDER — AZITHROMYCIN 250 MG PO TABS
1000.0000 mg | ORAL_TABLET | Freq: Once | ORAL | Status: AC
  Administered 2015-11-14: 1000 mg via ORAL
  Filled 2015-11-14: qty 4

## 2015-11-14 MED ORDER — CEFTRIAXONE SODIUM 250 MG IJ SOLR
250.0000 mg | Freq: Once | INTRAMUSCULAR | Status: AC
  Administered 2015-11-14: 250 mg via INTRAMUSCULAR
  Filled 2015-11-14: qty 250

## 2015-11-14 NOTE — MAU Note (Signed)
PT GOES TO [facility]-  PERAGUARD-  HAS  TROUBLE  WITH MEDICAID-  SO HAS  NOT  BEEN BACK .    CRAMPING    STARTED  11-22.     HAS  BROWN  D/C    AND  SLIGHT  NAUSEA. LAST SEX-  11-27.

## 2015-11-14 NOTE — Discharge Instructions (Signed)
Gonorrhea Gonorrhea is an infection that can cause serious problems. If left untreated, the infection may:   Damage the female or female organs.   Cause women to be unable to have children (sterility).   Harm a fetus if the infected woman is pregnant.  It is important to get treatment for gonorrhea as soon as possible. It is also necessary that all your sexual partners be tested for the infection.  CAUSES  Gonorrhea is caused by bacteria called Neisseria gonorrhoeae. The infection is spread from person to person, usually by sexual contact (such as by anal, vaginal, or oral means). A newborn can contract the infection from his or her mother during birth.  RISK FACTORS  Being a woman younger than 23 years of age who is sexually active.  Being a woman 48 years of age or older who has:  A new sex partner.  More than one sex partner.  A sex partner who has a sexually transmitted disease (STD).  Using condoms inconsistently.  Currently having, or having previously had, an STD.  Exchanging sex or money or drugs. SYMPTOMS  Some people with gonorrhea do not have symptoms. Symptoms may be different in females and males.  Females The most common symptoms are:   Pain in the lower abdomen.   Fever with or without chills.  Other symptoms include:   Abnormal vaginal discharge.   Painful intercourse.   Burning or itching of the vagina or lips of the vagina.   Abnormal vaginal bleeding.   Pain when urinating.   Long-lasting (chronic) pain in the lower abdomen, especially during menstruation or intercourse.   Inability to become pregnant.   Going into premature labor.   Irritation, pain, bleeding, or discharge from the rectum. This may occur if the infection was spread by anal sex.   Sore throat or swollen lymph nodes in the neck. This may occur if the infection was spread by oral sex.  Males The most common symptoms are:   Discharge from the penis.   Pain  or burning during urination.   Pain or swelling in the testicles. Other symptoms may include:   Irritation, pain, bleeding, or discharge from the rectum. This may occur if the infection was spread by anal sex.   Sore throat, fever, or swollen lymph nodes in the neck. This may occur if the infection was spread by oral sex.  DIAGNOSIS  A diagnosis is made after a physical exam is done and a sample of discharge is examined under a microscope for the presence of the bacteria. The discharge may be taken from the urethra, cervix, throat, or rectum.  TREATMENT  Gonorrhea is treated with antibiotic medicines. It is important for treatment to begin as soon as possible. Early treatment may prevent some problems from developing. Do not have sex. Avoid all types of sexual activity for 7 days after treatment is complete and until any sex partners have been treated. HOME CARE INSTRUCTIONS   Take medicines only as directed by your health care provider.   Take your antibiotic medicine as directed by your health care provider. Finish the antibiotic even if you start to feel better. Incomplete treatment will put you at risk for continued infection.   Do not have sex until treatment is complete or as directed by your health care provider.   Keep all follow-up visits as directed by your health care provider.   Not all test results are available during your visit. If your test results are not back  during the visit, make an appointment with your health care provider to find out the results. Do not assume everything is normal if you have not heard from your health care provider or the medical facility. It is your responsibility to get your test results.  If you test positive for gonorrhea, inform your recent sexual partners. They need to be checked for gonorrhea even if they do not have symptoms. They may need treatment, even if they test negative for gonorrhea.  SEEK MEDICAL CARE IF:   You develop any  bad reaction to the medicine you were prescribed. This may include:   A rash.   Nausea.   Vomiting.   Diarrhea.   Your symptoms do not improve after a few days of taking antibiotics.   Your symptoms get worse.   You develop increased pain, such as in the testicles (for males) or in the abdomen (for females).  You have a fever. MAKE SURE YOU:   Understand these instructions.  Will watch your condition.  Will get help right away if you are not doing well or get worse.   This information is not intended to replace advice given to you by your health care provider. Make sure you discuss any questions you have with your health care provider.   Document Released: 11/29/2000 Document Revised: 12/23/2014 Document Reviewed: 06/09/2013 Elsevier Interactive Patient Education 2016 [employer]  Chlamydia, Female Chlamydia is an infection. It is spread through sexual contact. Chlamydia can be in different areas of the body. These areas include the cervix, urethra, throat, or rectum. You may not know you have chlamydia because many people never develop the symptoms. Chlamydia is not difficult to treat once you know you have it. However, if it is left untreated, chlamydia can lead to more serious health problems.  CAUSES  Chlamydia is caused by bacteria. It is a sexually transmitted disease. It is passed from an infected partner during intimate contact. This contact could be with the genitals, mouth, or rectal area. Chlamydia can also be passed from mothers to babies during birth. SIGNS AND SYMPTOMS  There may not be any symptoms. This is often the case early in the infection. If symptoms develop, they may include:  Mild pain and discomfort when urinating.  Redness, soreness, and swelling (inflammation) of the rectum.  Vaginal discharge.  Painful intercourse.  Abdominal pain.  Bleeding between menstrual periods. DIAGNOSIS  To diagnose this infection, your health care  provider will do a pelvic exam. Cultures will be taken of the vagina, cervix, urine, and possibly the rectum to verify the diagnosis.  TREATMENT You will be given antibiotic medicines. If you are pregnant, certain types of antibiotics will need to be avoided. Any sexual partners should also be treated, even if they do not show symptoms. Your health care provider may test you for infection again 3 months after treatment. HOME CARE INSTRUCTIONS   Take your antibiotic medicine as directed by your health care provider. Finish the antibiotic even if you start to feel better.  Take medicines only as directed by your health care provider.  Inform any sexual partners about the infection. They should also be treated.  Do not have sexual contact until your health care provider tells you it is okay.  Get plenty of rest.  Eat a well-balanced diet.  Drink enough fluids to keep your urine clear or pale yellow.  Keep all follow-up visits as directed by your health care provider. SEEK MEDICAL CARE IF:  You have painful  painful urination.  You have abdominal pain.  You have vaginal discharge.  You have painful sexual intercourse.  You have bleeding between periods and after sex.  You have a fever. SEEK IMMEDIATE MEDICAL CARE IF:   You experience nausea or vomiting.  You experience excessive sweating (diaphoresis).  You have difficulty swallowing.   This information is not intended to replace advice given to you by your health care provider. Make sure you discuss any questions you have with your health care provider.   Document Released: 09/11/2005 Document Revised: 08/23/2015 Document Reviewed: 08/09/2013 Elsevier Interactive Patient Education 2016 [employer]  

## 2015-11-14 NOTE — MAU Provider Note (Signed)
History     CSN: [id number]  Arrival date and time: 11/13/15 2333   First Provider Initiated Contact with Patient 11/14/15 0142      Chief Complaint  Patient presents with  . Vaginal Bleeding   HPI Comments: Lauren Conway is a 24 y.o G3P2012 who presents today for a pregnancy test, and STI testing/treatment. Patient states that she would like to have "the shot and the pills for STDs now". She does not wish to wait for results.   Vaginal Discharge The patient's primary symptoms include vaginal discharge. This is a new problem. The current episode started in the past 7 days. The problem occurs intermittently. The problem has been unchanged. The patient is experiencing no pain. She is not pregnant. Pertinent negatives include no abdominal pain, chills, constipation, diarrhea, dysuria, fever, frequency, nausea, urgency or vomiting. The vaginal discharge was bloody. The vaginal bleeding is spotting. She has not been passing clots. She has not been passing tissue. Nothing aggravates the symptoms. She has tried nothing for the symptoms. She is sexually active. It is possible that her partner has an STD. She uses an IUD for contraception.     Past Medical History  Diagnosis Date  . History of gonorrhea     12/2008; 09/2010;   . History of chlamydia     12/2008; 03/09/2009  . Anxiety   . Depression   . Asthma     uses inhaler, last used 8/22    Past Surgical History  Procedure Laterality Date  . No past surgeries      Family History  Problem Relation Age of Onset  . Other Neg Hx   . Hypertension Mother   . Asthma Maternal Grandmother     Social History  Substance Use Topics  . Smoking status: Former Smoker -- 0.25 packs/day  . Smokeless tobacco: Never Used  . Alcohol Use: 0.0 oz/week    0 Standard drinks or equivalent per week     Comment: Rare    Allergies:  Allergies  Allergen Reactions  . Eggs Or Egg-Derived Products Hives and Swelling  . Latex Itching and  Swelling    Prescriptions prior to admission  Medication Sig Dispense Refill Last Dose  . acetaminophen (TYLENOL) 325 MG tablet Take 650 mg by mouth every 6 (six) hours as needed.   Taking  . buPROPion (WELLBUTRIN SR) 100 MG 12 hr tablet Take 1 tablet (100 mg total) by mouth 2 (two) times daily. 30 tablet 12 Taking  . dibucaine (NUPERCAINAL) 1 % OINT Place 1 application rectally as needed for hemorrhoids. (Patient not taking: Reported on 09/01/2015) 56.7 g 0 Not Taking  . FeAsp-B12-FA-C-DSS-SuccAc-Zn (FERIVA 21/7) 75-1 MG TABS Take 1 tablet by mouth daily. (Patient not taking: Reported on 09/01/2015) 28 tablet 2 Not Taking  . hydrocortisone (ANUSOL-HC) 25 MG suppository Place 1 suppository (25 mg total) rectally 2 (two) times daily. (Patient not taking: Reported on 09/14/2015) 12 suppository 0 Not Taking  . ibuprofen (ADVIL,MOTRIN) 800 MG tablet Take 1 tablet (800 mg total) by mouth every 8 (eight) hours as needed. (Patient not taking: Reported on 09/01/2015) 90 tablet 4 Not Taking  . oxyCODONE-acetaminophen (PERCOCET/ROXICET) 5-325 MG per tablet Take 2 tablets by mouth every 4 (four) hours as needed (for pain scale greater than 7). (Patient not taking: Reported on 09/01/2015) 40 tablet 0 Not Taking  . Prenatal Vit-Fe Fumarate-FA (PRENATAL MULTIVITAMIN) TABS tablet Take 1 tablet by mouth daily.    Not Taking  . PROVENTIL HFA 108 (90  BASE) MCG/ACT inhaler INHALE 2 PUFFS INTO THE LUNGS EVERY 6 (SIX) HOURS AS NEEDED FOR WHEEZING OR SHORTNESS OF BREATH. 6.7 Inhaler 2   . senna-docusate (SENOKOT-S) 8.6-50 MG per tablet Take 2 tablets by mouth daily. (Patient not taking: Reported on 09/01/2015) 90 tablet 2 Not Taking  . witch hazel-glycerin (TUCKS) pad Apply 1 application topically as needed for hemorrhoids. (Patient not taking: Reported on 09/01/2015) 40 each 12 Not Taking    Review of Systems  Constitutional: Negative for fever and chills.  Gastrointestinal: Negative for nausea, vomiting, abdominal pain,  diarrhea and constipation.  Genitourinary: Positive for vaginal discharge. Negative for dysuria, urgency and frequency.   Physical Exam   Blood pressure 119/71, pulse 78, temperature 98.2 F (36.8 C), temperature source Oral, resp. rate 18, height  (1.575 m), weight 83.689 kg (184 lb 8 oz), last menstrual period 10/30/2015, not currently breastfeeding.  Physical Exam  Nursing note and vitals reviewed. Constitutional: She is oriented to person, place, and time. She appears well-developed and well-nourished. No distress.  HENT:  Head: Normocephalic.  Eyes: Pupils are equal, round, and reactive to light.  Respiratory: Effort normal.  GI: Soft.  Musculoskeletal: Normal range of motion.  Neurological: She is alert and oriented to person, place, and time.  Skin: Skin is warm and dry.  Psychiatric: She has a normal mood and affect.   Results for orders placed or performed during the hospital encounter of 11/13/15 (from the past 24 hour(s))  Urinalysis, Routine w reflex microscopic (not at [facility])     Status: Abnormal   Collection Time: 11/14/15 12:12 AM  Result Value Ref Range   Color, Urine YELLOW YELLOW   APPearance CLEAR CLEAR   Specific Gravity, Urine 1.020 1.005 - 1.030   pH 7.0 5.0 - 8.0   Glucose, UA NEGATIVE NEGATIVE mg/dL   Hgb urine dipstick NEGATIVE NEGATIVE   Bilirubin Urine NEGATIVE NEGATIVE   Ketones, ur NEGATIVE NEGATIVE mg/dL   Protein, ur 30 (A) NEGATIVE mg/dL   Nitrite NEGATIVE NEGATIVE   Leukocytes, UA NEGATIVE NEGATIVE  Urine microscopic-add on     Status: Abnormal   Collection Time: 11/14/15 12:12 AM  Result Value Ref Range   Squamous Epithelial / LPF 0-5 (A) NONE SEEN   WBC, UA 0-5 0 - 5 WBC/hpf   RBC / HPF NONE SEEN 0 - 5 RBC/hpf   Bacteria, UA RARE (A) NONE SEEN  Pregnancy, urine POC     Status: None   Collection Time: 11/14/15 12:23 AM  Result Value Ref Range   Preg Test, Ur NEGATIVE NEGATIVE  Wet prep, genital     Status: Abnormal   Collection  Time: 11/14/15  1:38 AM  Result Value Ref Range   Yeast Wet Prep HPF POC PRESENT (A) NONE SEEN   Trich, Wet Prep NONE SEEN NONE SEEN   Clue Cells Wet Prep HPF POC NONE SEEN NONE SEEN   WBC, Wet Prep HPF POC FEW (A) NONE SEEN   Sperm NONE SEEN     MAU Course  Procedures  MDM Patient given  rocephin and 1g of azithromycin while here.   Assessment and Plan   1. Irregular menstrual cycle   2. Exposure to sexually transmitted disease (STD)   3. Encounter for pregnancy test with result negative    DC home Comfort measures reviewed  Safe sex discussed  RX: none  Return to MAU as needed FU with OB as planned  Follow-up Information    Follow up with HARPER,CHARLES  A, MD.   Specialty:  Obstetrics and Gynecology   Why:  As needed   Contact information:   [address] [place] [address] [phone]         Tawnya Crook 11/14/2015, 2:24 AM

## 2015-11-15 ENCOUNTER — Other Ambulatory Visit: Payer: Self-pay | Admitting: Obstetrics

## 2015-11-15 DIAGNOSIS — A749 Chlamydial infection, unspecified: Secondary | ICD-10-CM

## 2015-11-15 MED ORDER — CEFIXIME 400 MG PO TABS: 400.0000 mg | ORAL_TABLET | Freq: Every day | ORAL | Status: DC

## 2015-11-15 MED ORDER — AZITHROMYCIN 250 MG PO TABS: 1000.0000 mg | ORAL_TABLET | Freq: Once | ORAL | Status: DC

## 2015-12-03 ENCOUNTER — Encounter (HOSPITAL_COMMUNITY): Payer: Self-pay | Admitting: Emergency Medicine

## 2015-12-03 ENCOUNTER — Emergency Department (HOSPITAL_COMMUNITY)
Admission: EM | Admit: 2015-12-03 | Discharge: 2015-12-03 | Disposition: A | Discharge status: Home / Self Care | Payer: Medicaid Other | Attending: Emergency Medicine | Admitting: Emergency Medicine

## 2015-12-03 DIAGNOSIS — Z792 Long term (current) use of antibiotics: Secondary | ICD-10-CM | POA: Insufficient documentation

## 2015-12-03 DIAGNOSIS — Z87891 Personal history of nicotine dependence: Secondary | ICD-10-CM | POA: Insufficient documentation

## 2015-12-03 DIAGNOSIS — F419 Anxiety disorder, unspecified: Secondary | ICD-10-CM | POA: Insufficient documentation

## 2015-12-03 DIAGNOSIS — Z8619 Personal history of other infectious and parasitic diseases: Secondary | ICD-10-CM | POA: Insufficient documentation

## 2015-12-03 DIAGNOSIS — J45901 Unspecified asthma with (acute) exacerbation: Secondary | ICD-10-CM | POA: Insufficient documentation

## 2015-12-03 DIAGNOSIS — F329 Major depressive disorder, single episode, unspecified: Secondary | ICD-10-CM | POA: Insufficient documentation

## 2015-12-03 DIAGNOSIS — Z91040 Latex allergy status: Secondary | ICD-10-CM | POA: Insufficient documentation

## 2015-12-03 DIAGNOSIS — Z79899 Other long term (current) drug therapy: Secondary | ICD-10-CM | POA: Insufficient documentation

## 2015-12-03 MED ORDER — IPRATROPIUM-ALBUTEROL 0.5-2.5 (3) MG/3ML IN SOLN
3.0000 mL | Freq: Once | RESPIRATORY_TRACT | Status: AC
  Administered 2015-12-03: 3 mL via RESPIRATORY_TRACT
  Filled 2015-12-03: qty 3

## 2015-12-03 MED ORDER — PREDNISONE 10 MG (21) PO TBPK: 10.0000 mg | ORAL_TABLET | Freq: Every day | ORAL | Status: DC

## 2015-12-03 MED ORDER — PREDNISONE 20 MG PO TABS
60.0000 mg | ORAL_TABLET | Freq: Once | ORAL | Status: AC
  Administered 2015-12-03: 60 mg via ORAL
  Filled 2015-12-03: qty 3

## 2015-12-03 MED ORDER — ALBUTEROL SULFATE HFA 108 (90 BASE) MCG/ACT IN AERS
2.0000 | INHALATION_SPRAY | Freq: Once | RESPIRATORY_TRACT | Status: AC
  Administered 2015-12-03: 2 via RESPIRATORY_TRACT
  Filled 2015-12-03: qty 6.7

## 2015-12-03 MED ORDER — ALBUTEROL SULFATE (2.5 MG/3ML) 0.083% IN NEBU
5.0000 mg | INHALATION_SOLUTION | Freq: Once | RESPIRATORY_TRACT | Status: AC
  Administered 2015-12-03: 5 mg via RESPIRATORY_TRACT
  Filled 2015-12-03: qty 6

## 2015-12-03 NOTE — Discharge Instructions (Signed)
Read the information below.  Use the prescribed medication as directed.  Please discuss all new medications with your pharmacist.  You may return to the Emergency Department at any time for worsening condition or any new symptoms that concern you.   If you develop worsening shortness of breath, uncontrolled wheezing, severe chest pain, or fevers despite using tylenol and/or ibuprofen, return for a recheck.     ° ° °Asthma, Adult °Asthma is a recurring condition in which the airways tighten and narrow. Asthma can make it difficult to breathe. It can cause coughing, wheezing, and shortness of breath. Asthma episodes, also called asthma attacks, range from minor to life-threatening. Asthma cannot be cured, but medicines and lifestyle changes can help control it. °CAUSES °Asthma is believed to be caused by inherited (genetic) and environmental factors, but its exact cause is unknown. Asthma may be triggered by allergens, lung infections, or irritants in the air. Asthma triggers are different for each person. Common triggers include:  °· Animal dander. °· Dust mites. °· Cockroaches. °· Pollen from trees or grass. °· Mold. °· Smoke. °· Air pollutants such as dust, household cleaners, hair sprays, aerosol sprays, paint fumes, strong chemicals, or strong odors. °· Cold air, weather changes, and winds (which increase molds and pollens in the air). °· Strong emotional expressions such as crying or laughing hard. °· Stress. °· Certain medicines (such as aspirin) or types of drugs (such as beta-blockers). °· Sulfites in foods and drinks. Foods and drinks that may contain sulfites include dried fruit, potato chips, and sparkling grape juice. °· Infections or inflammatory conditions such as the flu, a cold, or an inflammation of the nasal membranes (rhinitis). °· Gastroesophageal reflux disease (GERD). °· Exercise or strenuous activity. °SYMPTOMS °Symptoms may occur immediately after asthma is triggered or many hours later.  Symptoms include: °· Wheezing. °· Excessive nighttime or early morning coughing. °· Frequent or severe coughing with a common cold. °· Chest tightness. °· Shortness of breath. °DIAGNOSIS  °The diagnosis of asthma is made by a review of your medical history and a physical exam. Tests may also be performed. These may include: °· Lung function studies. These tests show how much air you breathe in and out. °· Allergy tests. °· Imaging tests such as X-rays. °TREATMENT  °Asthma cannot be cured, but it can usually be controlled. Treatment involves identifying and avoiding your asthma triggers. It also involves medicines. There are 2 classes of medicine used for asthma treatment:  °· Controller medicines. These prevent asthma symptoms from occurring. They are usually taken every day. °· Reliever or rescue medicines. These quickly relieve asthma symptoms. They are used as needed and provide short-term relief. °Your health care provider will help you create an asthma action plan. An asthma action plan is a written plan for managing and treating your asthma attacks. It includes a list of your asthma triggers and how they may be avoided. It also includes information on when medicines should be taken and when their dosage should be changed. An action plan may also involve the use of a device called a peak flow meter. A peak flow meter measures how well the lungs are working. It helps you monitor your condition. °HOME CARE INSTRUCTIONS  °· Take medicines only as directed by your health care provider. Speak with your health care provider if you have questions about how or when to take the medicines. °· Use a peak flow meter as directed by your health care provider. Record and keep track of   readings. °· Understand and use the action plan to help minimize or stop an asthma attack without needing to seek medical care. °· Control your home environment in the following ways to help prevent asthma attacks: °· Do not smoke. Avoid being  exposed to secondhand smoke. °· Change your heating and air conditioning filter regularly. °· Limit your use of fireplaces and wood stoves. °· Get rid of pests (such as roaches and mice) and their droppings. °· Throw away plants if you see mold on them. °· Clean your floors and dust regularly. Use unscented cleaning products. °· Try to have someone else vacuum for you regularly. Stay out of rooms while they are being vacuumed and for a short while afterward. If you vacuum, use a dust mask from a hardware store, a double-layered or microfilter vacuum cleaner bag, or a vacuum cleaner with a HEPA filter. °· Replace carpet with wood, tile, or vinyl flooring. Carpet can trap dander and dust. °· Use allergy-proof pillows, mattress covers, and box spring covers. °· Wash bed sheets and blankets every week in hot water and dry them in a dryer. °· Use blankets that are made of polyester or cotton. °· Clean bathrooms and kitchens with bleach. If possible, have someone repaint the walls in these rooms with mold-resistant paint. Keep out of the rooms that are being cleaned and painted. °· Wash hands frequently. °SEEK MEDICAL CARE IF:  °· You have wheezing, shortness of breath, or a cough even if taking medicine to prevent attacks. °· The colored mucus you cough up (sputum) is thicker than usual. °· Your sputum changes from clear or white to yellow, green, gray, or bloody. °· You have any problems that may be related to the medicines you are taking (such as a rash, itching, swelling, or trouble breathing). °· You are using a reliever medicine more than 2-3 times per week. °· Your peak flow is still at 50-79% of your personal best after following your action plan for 1 hour. °· You have a fever. °SEEK IMMEDIATE MEDICAL CARE IF:  °· You seem to be getting worse and are unresponsive to treatment during an asthma attack. °· You are short of breath even at rest. °· You get short of breath when doing very little physical  activity. °· You have difficulty eating, drinking, or talking due to asthma symptoms. °· You develop chest pain. °· You develop a fast heartbeat. °· You have a bluish color to your lips or fingernails. °· You are light-headed, dizzy, or faint. °· Your peak flow is less than 50% of your personal best. °  °This information is not intended to replace advice given to you by your health care provider. Make sure you discuss any questions you have with your health care provider. °  °Document Released: 12/02/2005 Document Revised: 08/23/2015 Document Reviewed: 07/01/2013 °Elsevier Interactive Patient Education ©2016 [employer] ° °Asthma Attack Prevention °While you may not be able to control the fact that you have asthma, you can take actions to prevent asthma attacks. The best way to prevent asthma attacks is to maintain good control of your asthma. You can achieve this by: °· Taking your medicines as directed. °· Avoiding things that can irritate your airways or make your asthma symptoms worse (asthma triggers). °· Keeping track of how well your asthma is controlled and of any changes in your symptoms. °· Responding quickly to worsening asthma symptoms (asthma attack). °· Seeking emergency care when it is needed. °WHAT ARE SOME WAYS TO   PREVENT AN ASTHMA ATTACK? °Have a Plan °Work with your health care provider to create a written plan for managing and treating your asthma attacks (asthma action plan). This plan includes: °· A list of your asthma triggers and how you can avoid them. °· Information on when medicines should be taken and when their dosages should be changed. °· The use of a device that measures how well your lungs are working (peak flow meter). °Monitor Your Asthma °Use your peak flow meter and record your results in a journal every day. A drop in your peak flow numbers on one or more days may indicate the start of an asthma attack. This can happen even before you start to feel symptoms. You can prevent an  asthma attack from getting worse by following the steps in your asthma action plan. °Avoid Asthma Triggers °Work with your asthma health care provider to find out what your asthma triggers are. This can be done by: °· Allergy testing. °· Keeping a journal that notes when asthma attacks occur and the factors that may have contributed to them. °· Determining if there are other medical conditions that are making your asthma worse. °Once you have determined your asthma triggers, take steps to avoid them. This may include avoiding excessive or prolonged exposure to: °· Dust. Have someone dust and vacuum your home for you once or twice a week. Using a high-efficiency particulate arrestance (HEPA) vacuum is best. °· Smoke. This includes campfire smoke, forest fire smoke, and secondhand smoke from tobacco products. °· Pet dander. Avoid contact with animals that you know you are allergic to. °· Allergens from trees, grasses or pollens. Avoid spending a lot of time outdoors when pollen counts are high, and on very windy days. °· Very cold, dry, or humid air. °· Mold. °· Foods that contain high amounts of sulfites. °· Strong odors. °· Outdoor air pollutants, such as engine exhaust. °· Indoor air pollutants, such as aerosol sprays and fumes from household cleaners. °· Household pests, including dust mites and cockroaches, and pest droppings. °· Certain medicines, including NSAIDs. Always talk to your health care provider before stopping or starting any new medicines. °Medicines °Take over-the-counter and prescription medicines only as told by your health care provider. Many asthma attacks can be prevented by carefully following your medicine schedule. Taking your medicines correctly is especially important when you cannot avoid certain asthma triggers. °Act Quickly °If an asthma attack does happen, acting quickly can decrease how severe it is and how long it lasts. Take these steps:  °· Pay attention to your symptoms. If you  are coughing, wheezing, or having difficulty breathing, do not wait to see if your symptoms go away on their own. Follow your asthma action plan. °· If you have followed your asthma action plan and your symptoms are not improving, call your health care provider or seek immediate medical care at the nearest hospital. °It is important to note how often you need to use your fast-acting rescue inhaler. If you are using your rescue inhaler more often, it may mean that your asthma is not under control. Adjusting your asthma treatment plan may help you to prevent future asthma attacks and help you to gain better control of your condition. °HOW CAN I PREVENT AN ASTHMA ATTACK WHEN I EXERCISE? °Follow advice from your health care provider about whether you should use your fast-acting inhaler before exercising. Many people with asthma experience exercise-induced bronchoconstriction (EIB). This condition often worsens during vigorous exercise in cold,   humid, or dry environments. Usually, people with EIB can stay very active by pre-treating with a fast-acting inhaler before exercising. °  °This information is not intended to replace advice given to you by your health care provider. Make sure you discuss any questions you have with your health care provider. °  °Document Released: 11/20/2009 Document Revised: 08/23/2015 Document Reviewed: 05/04/2015 °Elsevier Interactive Patient Education ©2016 Elsevier Inc. ° °

## 2015-12-03 NOTE — ED Notes (Signed)
Pt from home .c/o shortness of breath yesterday. Hx of asthma, Pt out of inhaler. Slight left sided expiratory wheeze noted. Pt using cell phone during triage.

## 2015-12-03 NOTE — ED Provider Notes (Signed)
CSN: [id number]     Arrival date & time 12/03/15  1128 History   First MD Initiated Contact with Patient 12/03/15 1144     Chief Complaint  Patient presents with  . Asthma     (Consider location/radiation/quality/duration/timing/severity/associated sxs/prior Treatment) HPI   Pt with hx asthma presents with exacerbation of her asthma symptoms.  She has been out of her inhaler, developed chest tightness, SOB, wheezing yesterday.  States she believes this was triggered by changes in temperature outside and at her job, also she cleaned the house yesterday and was exposed to cleaning products.  Denies fevers, nasal symptoms, sore throat, leg swelling, recent travel, hx blood clots.  Past Medical History  Diagnosis Date  . History of gonorrhea     12/2008; 09/2010;   . History of chlamydia     12/2008; 03/09/2009  . Anxiety   . Depression   . Asthma     uses inhaler, last used 8/22   Past Surgical History  Procedure Laterality Date  . No past surgeries     Family History  Problem Relation Age of Onset  . Other Neg Hx   . Hypertension Mother   . Asthma Maternal Grandmother    Social History  Substance Use Topics  . Smoking status: Former Smoker -- 0.25 packs/day  . Smokeless tobacco: Never Used  . Alcohol Use: 0.0 oz/week    0 Standard drinks or equivalent per week     Comment: Rare   OB History    Gravida Para Term Preterm AB TAB SAB Ectopic Multiple Living   3 2 2  1  1   0 2     Review of Systems  Constitutional: Negative for fever and chills.  HENT: Negative for congestion, rhinorrhea, sinus pressure, sore throat and trouble swallowing.   Respiratory: Positive for chest tightness, shortness of breath and wheezing. Negative for cough.   Cardiovascular: Negative for leg swelling.  Musculoskeletal: Negative for myalgias.  Skin: Negative for rash.  Allergic/Immunologic: Negative for immunocompromised state.  Hematological: Does not bruise/bleed easily.       Allergies  Eggs or egg-derived products and Latex  Home Medications   Prior to Admission medications   Medication Sig Start Date End Date Taking? Authorizing Provider  acetaminophen (TYLENOL) 325 MG tablet Take 650 mg by mouth every 6 (six) hours as needed.    Historical Provider, MD  azithromycin (ZITHROMAX) 250 MG tablet Take 4 tablets (1,000 mg total) by mouth once. 11/15/15   Brock Badharles A Harper, MD  buPROPion (WELLBUTRIN SR) 100 MG 12 hr tablet Take 1 tablet (100 mg total) by mouth 2 (two) times daily. 07/12/15   Rachelle A Denney, CNM  cefixime (SUPRAX) 400 MG tablet Take 1 tablet (400 mg total) by mouth daily. 11/15/15   Brock Badharles A Harper, MD  dibucaine (NUPERCAINAL) 1 % OINT Place 1 application rectally as needed for hemorrhoids. Patient not taking: Reported on 09/01/2015 08/11/15   Rachelle A Denney, CNM  FeAsp-B12-FA-C-DSS-SuccAc-Zn (FERIVA 21/7) 75-1 MG TABS Take 1 tablet by mouth daily. Patient not taking: Reported on 09/01/2015 08/11/15   Roe Coombsachelle A Denney, CNM  hydrocortisone (ANUSOL-HC) 25 MG suppository Place 1 suppository (25 mg total) rectally 2 (two) times daily. Patient not taking: Reported on 09/14/2015 09/05/15   Rachelle A Denney, CNM  ibuprofen (ADVIL,MOTRIN) 800 MG tablet Take 1 tablet (800 mg total) by mouth every 8 (eight) hours as needed. Patient not taking: Reported on 09/01/2015 08/11/15   Roe Coombsachelle A Denney, CNM  oxyCODONE-acetaminophen (  PERCOCET/ROXICET) 5-325 MG per tablet Take 2 tablets by mouth every 4 (four) hours as needed (for pain scale greater than 7). Patient not taking: Reported on 09/01/2015 08/11/15   Roe Coombs, CNM  Prenatal Vit-Fe Fumarate-FA (PRENATAL MULTIVITAMIN) TABS tablet Take 1 tablet by mouth daily.     Historical Provider, MD  PROVENTIL HFA 108 (90 BASE) MCG/ACT inhaler INHALE 2 PUFFS INTO THE LUNGS EVERY 6 (SIX) HOURS AS NEEDED FOR WHEEZING OR SHORTNESS OF BREATH. 09/30/15   Brock Bad, MD  senna-docusate (SENOKOT-S) 8.6-50 MG per  tablet Take 2 tablets by mouth daily. Patient not taking: Reported on 09/01/2015 08/11/15   Roe Coombs, CNM  witch hazel-glycerin (TUCKS) pad Apply 1 application topically as needed for hemorrhoids. Patient not taking: Reported on 09/01/2015 08/11/15   Boykin Reaper A Denney, CNM   BP 124/72 mmHg  Pulse 90  Temp(Src) 97.4 F (36.3 C) (Oral)  Resp 18  SpO2 97%  LMP 11/27/2015 Physical Exam  Constitutional: She appears well-developed and well-nourished. No distress.  HENT:  Head: Normocephalic and atraumatic.  Neck: Neck supple.  Cardiovascular: Normal rate and regular rhythm.   Pulmonary/Chest: Effort normal. No accessory muscle usage. No respiratory distress. She has no decreased breath sounds. She has wheezes. She has no rhonchi. She has no rales.  Neurological: She is alert.  Skin: She is not diaphoretic.  Nursing note and vitals reviewed.   ED Course  Procedures (including critical care time) Labs Review Labs Reviewed - No data to display  Imaging Review No results found. I have personally reviewed and evaluated these images and lab results as part of my medical decision-making.   EKG Interpretation None       Pt denies breastfeeding.   1:02 PM Pt reports improvement after second treatment and prednisone.  Lungs CTAB.  Pt resting comfortably.    MDM   Final diagnoses:  None   Afebrile, nontoxic patient with wheezing, SOB, chest tightness c/w her asthma, likely triggered by cleaning products and temperature changes. She is out of her albuterol at home.  Improved in ED with nebs, prednisone. D/C home with albuterol HFA, prednisone, PCP follow up.  Discussed result, findings, treatment, and follow up  with patient.  Pt given return precautions.  Pt verbalizes understanding and agrees with plan.        Trixie Dredge, PA-C 12/03/15 1302  Bethann Berkshire, MD 12/04/15 [id number]

## 2015-12-14 ENCOUNTER — Encounter (HOSPITAL_COMMUNITY): Payer: Self-pay | Admitting: *Deleted

## 2015-12-14 ENCOUNTER — Inpatient Hospital Stay (HOSPITAL_COMMUNITY)
Admission: AD | Admit: 2015-12-14 | Discharge: 2015-12-14 | Disposition: A | Discharge status: Home / Self Care | Payer: Self-pay | Source: Ambulatory Visit | Attending: Obstetrics | Admitting: Obstetrics

## 2015-12-14 DIAGNOSIS — L292 Pruritus vulvae: Secondary | ICD-10-CM | POA: Insufficient documentation

## 2015-12-14 DIAGNOSIS — Z87891 Personal history of nicotine dependence: Secondary | ICD-10-CM | POA: Insufficient documentation

## 2015-12-14 DIAGNOSIS — B9689 Other specified bacterial agents as the cause of diseases classified elsewhere: Secondary | ICD-10-CM

## 2015-12-14 DIAGNOSIS — J45909 Unspecified asthma, uncomplicated: Secondary | ICD-10-CM | POA: Insufficient documentation

## 2015-12-14 DIAGNOSIS — N898 Other specified noninflammatory disorders of vagina: Secondary | ICD-10-CM | POA: Insufficient documentation

## 2015-12-14 DIAGNOSIS — Z91012 Allergy to eggs: Secondary | ICD-10-CM | POA: Insufficient documentation

## 2015-12-14 DIAGNOSIS — Z91040 Latex allergy status: Secondary | ICD-10-CM | POA: Insufficient documentation

## 2015-12-14 DIAGNOSIS — N760 Acute vaginitis: Secondary | ICD-10-CM | POA: Insufficient documentation

## 2015-12-14 DIAGNOSIS — F329 Major depressive disorder, single episode, unspecified: Secondary | ICD-10-CM | POA: Insufficient documentation

## 2015-12-14 DIAGNOSIS — Z8619 Personal history of other infectious and parasitic diseases: Secondary | ICD-10-CM | POA: Insufficient documentation

## 2015-12-14 DIAGNOSIS — F419 Anxiety disorder, unspecified: Secondary | ICD-10-CM | POA: Insufficient documentation

## 2015-12-14 DIAGNOSIS — A6004 Herpesviral vulvovaginitis: Secondary | ICD-10-CM

## 2015-12-14 LAB — WET PREP, GENITAL
SPERM: NONE SEEN
Trich, Wet Prep: NONE SEEN
YEAST WET PREP: NONE SEEN

## 2015-12-14 LAB — URINE MICROSCOPIC-ADD ON: RBC / HPF: NONE SEEN RBC/hpf (ref 0–5)

## 2015-12-14 LAB — URINALYSIS, ROUTINE W REFLEX MICROSCOPIC
Bilirubin Urine: NEGATIVE
Glucose, UA: NEGATIVE mg/dL
Hgb urine dipstick: NEGATIVE
Ketones, ur: NEGATIVE mg/dL
NITRITE: NEGATIVE
PH: 6.5 (ref 5.0–8.0)
Protein, ur: NEGATIVE mg/dL
SPECIFIC GRAVITY, URINE: 1.01 (ref 1.005–1.030)

## 2015-12-14 LAB — POCT PREGNANCY, URINE: Preg Test, Ur: NEGATIVE

## 2015-12-14 MED ORDER — METRONIDAZOLE 0.75 % VA GEL: 1.0000 | Freq: Every day | VAGINAL | Status: DC

## 2015-12-14 MED ORDER — VALACYCLOVIR HCL 1 G PO TABS: 1000.0000 mg | ORAL_TABLET | Freq: Two times a day (BID) | ORAL | Status: AC

## 2015-12-14 NOTE — MAU Provider Note (Signed)
None     Chief Complaint:    Lauren Conway is  24 y.o. [id number].  Patient's last menstrual period was 11/27/2015.Marland Kitchen  Her pregnancy status is negative.  She and her partner were [facility] for [facility] a month ago.  Now has yellow DC with odor for 6 days.  She had oral sex with a new partner last week, and 3 days ago developed 2 bumps on her vulva that itch and burn.  Has Paragard IUD,    Past Medical History  Diagnosis Date  . History of gonorrhea     12/2008; 09/2010;   . History of chlamydia     12/2008; 03/09/2009  . Anxiety   . Depression   . Asthma     uses inhaler, last used 8/22    Past Surgical History  Procedure Laterality Date  . No past surgeries      Family History  Problem Relation Age of Onset  . Other Neg Hx   . Hypertension Mother   . Asthma Maternal Grandmother     Social History  Substance Use Topics  . Smoking status: Former Smoker -- 0.25 packs/day  . Smokeless tobacco: Never Used  . Alcohol Use: 0.0 oz/week    0 Standard drinks or equivalent per week     Comment: Rare    Allergies:  Allergies  Allergen Reactions  . Eggs Or Egg-Derived Products Hives and Swelling  . Latex Itching and Swelling    Prescriptions prior to admission  Medication Sig Dispense Refill Last Dose  . acetaminophen (TYLENOL) 325 MG tablet Take 650 mg by mouth every 6 (six) hours as needed.   Taking  . azithromycin (ZITHROMAX) 250 MG tablet Take 4 tablets (1,000 mg total) by mouth once. 4 tablet 0   . buPROPion (WELLBUTRIN SR) 100 MG 12 hr tablet Take 1 tablet (100 mg total) by mouth 2 (two) times daily. 30 tablet 12 Taking  . cefixime (SUPRAX) 400 MG tablet Take 1 tablet (400 mg total) by mouth daily. 1 tablet 0   . dibucaine (NUPERCAINAL) 1 % OINT Place 1 application rectally as needed for hemorrhoids. (Patient not taking: Reported on 09/01/2015) 56.7 g 0 Not Taking  . FeAsp-B12-FA-C-DSS-SuccAc-Zn (FERIVA 21/7) 75-1 MG TABS Take 1 tablet by mouth daily. (Patient not taking:  Reported on 09/01/2015) 28 tablet 2 Not Taking  . hydrocortisone (ANUSOL-HC) 25 MG suppository Place 1 suppository (25 mg total) rectally 2 (two) times daily. (Patient not taking: Reported on 09/14/2015) 12 suppository 0 Not Taking  . ibuprofen (ADVIL,MOTRIN) 800 MG tablet Take 1 tablet (800 mg total) by mouth every 8 (eight) hours as needed. (Patient not taking: Reported on 09/01/2015) 90 tablet 4 Not Taking  . oxyCODONE-acetaminophen (PERCOCET/ROXICET) 5-325 MG per tablet Take 2 tablets by mouth every 4 (four) hours as needed (for pain scale greater than 7). (Patient not taking: Reported on 09/01/2015) 40 tablet 0 Not Taking  . predniSONE (STERAPRED UNI-PAK 21 TAB) 10 MG (21) TBPK tablet Take 1 tablet (10 mg total) by mouth daily. Day 1: take 6 tabs.  Day 2: 5 tabs  Day 3: 4 tabs  Day 4: 3 tabs  Day 5: 2 tabs  Day 6: 1 tab 21 tablet 0   . Prenatal Vit-Fe Fumarate-FA (PRENATAL MULTIVITAMIN) TABS tablet Take 1 tablet by mouth daily.    Not Taking  . PROVENTIL HFA 108 (90 BASE) MCG/ACT inhaler INHALE 2 PUFFS INTO THE LUNGS EVERY 6 (SIX) HOURS AS NEEDED FOR WHEEZING OR SHORTNESS OF BREATH.  6.7 Inhaler 2   . senna-docusate (SENOKOT-S) 8.6-50 MG per tablet Take 2 tablets by mouth daily. (Patient not taking: Reported on 09/01/2015) 90 tablet 2 Not Taking  . witch hazel-glycerin (TUCKS) pad Apply 1 application topically as needed for hemorrhoids. (Patient not taking: Reported on 09/01/2015) 40 each 12 Not Taking     Review of Systems   Constitutional: Negative for fever and chills Eyes: Negative for visual disturbances Respiratory: Negative for shortness of breath, dyspnea Cardiovascular: Negative for chest pain or palpitations  Gastrointestinal: Negative for vomiting, diarrhea and constipation Genitourinary: Negative for dysuria and urgency Musculoskeletal: Negative for back pain, joint pain, myalgias  Neurological: Negative for dizziness and headaches     Physical Exam   Blood pressure 121/66,  pulse 95, temperature 98.1 F (36.7 C), resp. rate 20, height 5' 2.5" (1.588 m), weight 182 lb 2 oz (82.611 kg), last menstrual period 11/27/2015, not currently breastfeeding.  General: General appearance - alert, well appearing, and in no distress Chest - normal resp effort Heart - normal rate and regular rhythm Abdomen - soft, nontender, nondistended, no masses or organomegaly Pelvic - has 2 vesicular lesions on labia majora. SSE:  Thin yellow discharge, amine odor.  HSV culture taken, but lesions mostly dry.  GC/CHL and wet prep collected Extremities - no pedal edema noted   Labs: Results for orders placed or performed during the hospital encounter of 12/14/15 (from the past 24 hour(s))  Urinalysis, Routine w reflex microscopic (not [facility])   Collection Time: 12/14/15  7:04 PM  Result Value Ref Range   Color, Urine YELLOW YELLOW   APPearance CLEAR CLEAR   Specific Gravity, Urine 1.010 1.005 - 1.030   pH 6.5 5.0 - 8.0   Glucose, UA NEGATIVE NEGATIVE mg/dL   Hgb urine dipstick NEGATIVE NEGATIVE   Bilirubin Urine NEGATIVE NEGATIVE   Ketones, ur NEGATIVE NEGATIVE mg/dL   Protein, ur NEGATIVE NEGATIVE mg/dL   Nitrite NEGATIVE NEGATIVE   Leukocytes, UA SMALL (A) NEGATIVE  Urine microscopic-add on   Collection Time: 12/14/15  7:04 PM  Result Value Ref Range   Squamous Epithelial / LPF 0-5 (A) NONE SEEN   WBC, UA 6-30 0 - 5 WBC/hpf   RBC / HPF NONE SEEN 0 - 5 RBC/hpf   Bacteria, UA FEW (A) NONE SEEN   Urine-Other MUCOUS PRESENT   Pregnancy, urine POC   Collection Time: 12/14/15  8:21 PM  Result Value Ref Range   Preg Test, Ur NEGATIVE NEGATIVE  Wet prep, genital   Collection Time: 12/14/15  8:40 PM  Result Value Ref Range   Yeast Wet Prep HPF POC NONE SEEN NONE SEEN   Trich, Wet Prep NONE SEEN NONE SEEN   Clue Cells Wet Prep HPF POC PRESENT (A) NONE SEEN   WBC, Wet Prep HPF POC MODERATE (A) NONE SEEN   Sperm NONE SEEN    Imaging Studies:  No results  found.   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

## 2015-12-14 NOTE — Discharge Instructions (Signed)

## 2015-12-14 NOTE — MAU Note (Addendum)
PT SAYS  SHE HAS  A YELLOW  SMELLY  D/C-   STARTED  ON 12-23.   HAS  LOWER  ABD  PAIN -      WAS HERE ON 11-28-   HAD STD- GOT  TX-.   SHE  IS  NOT   SURE  IF  HE  HAD  TX-        SO SHE  THINKS  HAS   STD  AGAIN       SO NOW  SHE  HAS  2     BUMPS  ON  PERINEUM    -  THAT  ITCH  AND  BURN.        ON PERIGUARD  -  BC

## 2015-12-16 LAB — GC/CHLAMYDIA PROBE AMP (~~LOC~~) NOT AT ARMC
Chlamydia: NEGATIVE
NEISSERIA GONORRHEA: NEGATIVE

## 2015-12-18 LAB — HERPES SIMPLEX VIRUS CULTURE
Culture: DETECTED
SPECIAL REQUESTS: NORMAL

## 2015-12-19 ENCOUNTER — Encounter: Payer: Self-pay | Admitting: Advanced Practice Midwife

## 2015-12-19 ENCOUNTER — Telehealth: Payer: Self-pay | Admitting: Student

## 2015-12-19 ENCOUNTER — Encounter: Payer: Self-pay | Admitting: Student

## 2015-12-19 ENCOUNTER — Telehealth: Payer: Self-pay | Admitting: *Deleted

## 2015-12-19 DIAGNOSIS — B009 Herpesviral infection, unspecified: Secondary | ICD-10-CM

## 2015-12-19 MED ORDER — VALACYCLOVIR HCL 500 MG PO TABS: 500.0000 mg | ORAL_TABLET | Freq: Every day | ORAL | Status: DC

## 2015-12-19 NOTE — Telephone Encounter (Signed)
Pt identity verified by name, DOB, & last 4 of SSN.  Informed of positive HSV result. Continue taking valtrex for current outbreak. If future rx needed contact [facility]. Questions answered.   Judeth HornErin Shirel Mallis, NP

## 2015-12-19 NOTE — Telephone Encounter (Signed)
Patient is calling to report that she was diagnosed with herpes.Patient is requesting suppressive therapy. Reviewed suppressive therapy and its use.

## 2016-01-10 ENCOUNTER — Ambulatory Visit: Payer: BLUE CROSS/BLUE SHIELD | Admitting: Certified Nurse Midwife

## 2016-01-12 ENCOUNTER — Ambulatory Visit (INDEPENDENT_AMBULATORY_CARE_PROVIDER_SITE_OTHER): Payer: BLUE CROSS/BLUE SHIELD | Admitting: Certified Nurse Midwife

## 2016-01-12 ENCOUNTER — Encounter: Payer: Self-pay | Admitting: Certified Nurse Midwife

## 2016-01-12 VITALS — BP 124/75 | HR 74 | Temp 98.2°F

## 2016-01-12 DIAGNOSIS — Z30431 Encounter for routine checking of intrauterine contraceptive device: Secondary | ICD-10-CM | POA: Diagnosis not present

## 2016-01-12 DIAGNOSIS — B373 Candidiasis of vulva and vagina: Secondary | ICD-10-CM | POA: Diagnosis not present

## 2016-01-12 DIAGNOSIS — B3731 Acute candidiasis of vulva and vagina: Secondary | ICD-10-CM

## 2016-01-12 MED ORDER — FLUCONAZOLE 100 MG PO TABS: 100.0000 mg | ORAL_TABLET | Freq: Once | ORAL | Status: DC

## 2016-01-12 NOTE — Progress Notes (Signed)
Patient ID: Lauren Conway, female   DOB: Sep 18, 1991, 25 y.o.   MRN: [id number]  Chief Complaint  Patient presents with  . Advice Only    HPI Lauren Conway is a 25 y.o. female.  Had IUD placed 09/14/15, postpartum.  Has had two new partners since the birth of her son.  Desires to have IUD removed today because she states she is not going to be sexually active.  States that she wants to return to the father of her children.  Has had chlamydia and gonorrhea in the past.  Has also had recent dx of herpes from her last boyfriend.  Recent sexual intercourse on Tuesday.  Did not use condoms.  Denies any vaginal discharge.  Denies any complications with the IUD.  Discussed removal when patient is ready to have another child.  Vaginal cultures obtained.        HPI  Past Medical History  Diagnosis Date  . History of gonorrhea     12/2008; 09/2010;   . History of chlamydia     12/2008; 03/09/2009  . Anxiety   . Depression   . Asthma     uses inhaler, last used 8/22  . 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 No past surgeries      Family History  Problem Relation Age of Onset  . Other Neg Hx   . Hypertension Mother   . Asthma Maternal Grandmother     Social History Social History  Substance Use Topics  . Smoking status: Current Every Day Smoker -- 0.25 packs/day  . Smokeless tobacco: Never Used  . Alcohol Use: 0.0 oz/week    0 Standard drinks or equivalent per week     Comment: Rare    Allergies  Allergen Reactions  . Eggs Or Egg-Derived Products Hives and Swelling  . Latex Itching and Swelling    Current Outpatient Prescriptions  Medication Sig Dispense Refill  . ibuprofen (ADVIL,MOTRIN) 800 MG tablet Take 1 tablet (800 mg total) by mouth every 8 (eight) hours as needed. 90 tablet 4  . valACYclovir (VALTREX) 500 MG tablet Take 1 tablet (500 mg total) by mouth daily. May take 500 mg twice daily for  3 days for episodic flare 36 tablet 11  . buPROPion (WELLBUTRIN SR) 100 MG 12 hr tablet Take 1 tablet (100 mg total) by mouth 2 (two) times daily. (Patient not taking: Reported on 01/12/2016) 30 tablet 12  . fluconazole (DIFLUCAN) 100 MG tablet Take 1 tablet (100 mg total) by mouth once. Repeat dose in 48-72 hour. 3 tablet 0   No current facility-administered medications for this visit.    Review of Systems Review of Systems Constitutional: negative for fatigue and weight loss Respiratory: negative for cough and wheezing Cardiovascular: negative for chest pain, fatigue and palpitations Gastrointestinal: negative for abdominal pain and change in bowel habits Genitourinary:negative Integument/breast: negative for nipple discharge Musculoskeletal:negative for myalgias Neurological: negative for gait problems and tremors Behavioral/Psych: negative for abusive relationship, depression Endocrine: negative for temperature intolerance     Blood pressure 124/75, pulse 74, temperature 98.2 F (36.8 C), last menstrual period 12/24/2015, not currently breastfeeding.  Physical Exam Physical Exam General:   alert  Skin:   no rash or abnormalities  Lungs:   clear to auscultation bilaterally  Heart:   regular rate and rhythm, S1, S2 normal, no murmur, click, rub or gallop  Breasts:   deferred  Abdomen:  normal findings: no organomegaly, soft, non-tender and no hernia  Pelvis:  External genitalia: normal general appearance Urinary system: urethral meatus normal and bladder without fullness, nontender Vaginal: normal without tenderness, induration or masses, +white, chunky vaginal discharge Cervix: normal appearance, +IUD strings present Adnexa: normal bimanual exam Uterus: anteverted and non-tender, normal size    50% of 15 min visit spent on counseling and coordination of care.   Data Reviewed Previous medical hx, meds, labs  Assessment     Vaginal candidiasis High risk sexual  behaviors IUD check up     Plan    Orders Placed This Encounter  Procedures  . SureSwab, Vaginosis/Vaginitis Plus   Meds ordered this encounter  Medications  . fluconazole (DIFLUCAN) 100 MG tablet    Sig: Take 1 tablet (100 mg total) by mouth once. Repeat dose in 48-72 hour.    Dispense:  3 tablet    Refill:  0     Follow up as needed.

## 2016-01-17 LAB — SURESWAB, VAGINOSIS/VAGINITIS PLUS
ATOPOBIUM VAGINAE: NOT DETECTED Log (cells/mL)
C. PARAPSILOSIS, DNA: NOT DETECTED
C. TROPICALIS, DNA: NOT DETECTED
C. albicans, DNA: NOT DETECTED
C. glabrata, DNA: NOT DETECTED
C. trachomatis RNA, TMA: NOT DETECTED
Gardnerella vaginalis: 5.6 Log (cells/mL)
LACTOBACILLUS SPECIES: 7.5 Log (cells/mL)
MEGASPHAERA SPECIES: NOT DETECTED Log (cells/mL)
N. GONORRHOEAE RNA, TMA: NOT DETECTED
T. vaginalis RNA, QL TMA: NOT DETECTED

## 2016-03-27 ENCOUNTER — Emergency Department (HOSPITAL_COMMUNITY): Admission: EM | Admit: 2016-03-27 | Discharge: 2016-03-27 | Discharge status: Absent without leave | Payer: Self-pay

## 2016-03-28 ENCOUNTER — Emergency Department (HOSPITAL_COMMUNITY): Payer: BLUE CROSS/BLUE SHIELD

## 2016-03-28 ENCOUNTER — Emergency Department (HOSPITAL_COMMUNITY)
Admission: EM | Admit: 2016-03-28 | Discharge: 2016-03-28 | Disposition: A | Discharge status: Home / Self Care | Payer: BLUE CROSS/BLUE SHIELD | Attending: Emergency Medicine | Admitting: Emergency Medicine

## 2016-03-28 ENCOUNTER — Encounter (HOSPITAL_COMMUNITY): Payer: Self-pay | Admitting: *Deleted

## 2016-03-28 DIAGNOSIS — R0981 Nasal congestion: Secondary | ICD-10-CM | POA: Diagnosis present

## 2016-03-28 DIAGNOSIS — J45901 Unspecified asthma with (acute) exacerbation: Secondary | ICD-10-CM | POA: Insufficient documentation

## 2016-03-28 DIAGNOSIS — F419 Anxiety disorder, unspecified: Secondary | ICD-10-CM | POA: Insufficient documentation

## 2016-03-28 DIAGNOSIS — F329 Major depressive disorder, single episode, unspecified: Secondary | ICD-10-CM | POA: Insufficient documentation

## 2016-03-28 DIAGNOSIS — Z8619 Personal history of other infectious and parasitic diseases: Secondary | ICD-10-CM | POA: Diagnosis not present

## 2016-03-28 DIAGNOSIS — F17210 Nicotine dependence, cigarettes, uncomplicated: Secondary | ICD-10-CM | POA: Diagnosis not present

## 2016-03-28 LAB — CBC
HCT: 32.9 % — ABNORMAL LOW (ref 36.0–46.0)
Hemoglobin: 11.1 g/dL — ABNORMAL LOW (ref 12.0–15.0)
MCH: 29.8 pg (ref 26.0–34.0)
MCHC: 33.7 g/dL (ref 30.0–36.0)
MCV: 88.4 fL (ref 78.0–100.0)
PLATELETS: 299 10*3/uL (ref 150–400)
RBC: 3.72 MIL/uL — ABNORMAL LOW (ref 3.87–5.11)
RDW: 14.6 % (ref 11.5–15.5)
WBC: 8.9 10*3/uL (ref 4.0–10.5)

## 2016-03-28 LAB — BASIC METABOLIC PANEL
ANION GAP: 11 (ref 5–15)
BUN: 5 mg/dL — AB (ref 6–20)
CALCIUM: 9.2 mg/dL (ref 8.9–10.3)
CO2: 25 mmol/L (ref 22–32)
Chloride: 103 mmol/L (ref 101–111)
Creatinine, Ser: 0.79 mg/dL (ref 0.44–1.00)
GFR calc Af Amer: >60 mL/min (ref >60)
GLUCOSE: 96 mg/dL (ref 65–99)
Potassium: 3.3 mmol/L — ABNORMAL LOW (ref 3.5–5.1)
SODIUM: 139 mmol/L (ref 135–145)

## 2016-03-28 MED ORDER — ALBUTEROL SULFATE HFA 108 (90 BASE) MCG/ACT IN AERS: 1.0000 | INHALATION_SPRAY | RESPIRATORY_TRACT | Status: DC | PRN

## 2016-03-28 MED ORDER — METHYLPREDNISOLONE SODIUM SUCC 125 MG IJ SOLR
125.0000 mg | Freq: Once | INTRAMUSCULAR | Status: AC
  Administered 2016-03-28: 125 mg via INTRAVENOUS
  Filled 2016-03-28: qty 2

## 2016-03-28 MED ORDER — ALBUTEROL (5 MG/ML) CONTINUOUS INHALATION SOLN
10.0000 mg/h | INHALATION_SOLUTION | RESPIRATORY_TRACT | Status: AC
  Administered 2016-03-28: 10 mg/h via RESPIRATORY_TRACT
  Filled 2016-03-28: qty 20

## 2016-03-28 MED ORDER — IPRATROPIUM BROMIDE 0.02 % IN SOLN
0.5000 mg | Freq: Once | RESPIRATORY_TRACT | Status: AC
  Administered 2016-03-28: 0.5 mg via RESPIRATORY_TRACT
  Filled 2016-03-28: qty 2.5

## 2016-03-28 MED ORDER — SODIUM CHLORIDE 0.9 % IV BOLUS (SEPSIS)
1000.0000 mL | Freq: Once | INTRAVENOUS | Status: AC
  Administered 2016-03-28: 1000 mL via INTRAVENOUS

## 2016-03-28 MED ORDER — ALBUTEROL (5 MG/ML) CONTINUOUS INHALATION SOLN: 10.0000 mg/h | INHALATION_SOLUTION | RESPIRATORY_TRACT | Status: DC

## 2016-03-28 MED ORDER — PREDNISONE 20 MG PO TABS: 40.0000 mg | ORAL_TABLET | Freq: Every day | ORAL | Status: DC

## 2016-03-28 MED ORDER — BENZONATATE 100 MG PO CAPS: 200.0000 mg | ORAL_CAPSULE | Freq: Two times a day (BID) | ORAL | Status: DC | PRN

## 2016-03-28 MED ORDER — MAGNESIUM SULFATE 2 GM/50ML IV SOLN
2.0000 g | Freq: Once | INTRAVENOUS | Status: AC
  Administered 2016-03-28: 2 g via INTRAVENOUS
  Filled 2016-03-28: qty 50

## 2016-03-28 NOTE — ED Notes (Signed)
Per pt, she has had to use her inhaler "multiple times an hour" for the last 2 days.

## 2016-03-28 NOTE — ED Notes (Signed)
Called RT to place patient on hour long neb.

## 2016-03-28 NOTE — ED Provider Notes (Signed)
CSN: [id number]     Arrival date & time 03/28/16  0008 History   First MD Initiated Contact with Patient 03/28/16 [phone]     Chief Complaint  Patient presents with  . Generalized Body Aches  . Nasal Congestion     (Consider location/radiation/quality/duration/timing/severity/associated sxs/prior Treatment) HPI   Makaylyn Sinyard is a(n) 25 y.o. female who presents to the ED with cc of URI and Asthma. Patient states that about 3 days ago she developed cough, nasal congestion, body aches. She has a history of asthma and reactive airway. Patient states that since that time she has had worsening wheezing, chest tightness, coughing and shortness of breath. She states that she has been using her inhaler multiple times an hour without significant relief of her symptoms. She denies a previous history of intubation, but has been hospitalized as a child for her asthma. She denies any fevers or chills. She says she's been taking albuterol, Delsym and ibuprofen for her symptoms. She denies any other symptoms such as vomiting, diarrhea, urinary symptoms.    Past Medical History  Diagnosis Date  . History of gonorrhea     12/2008; 09/2010;   . History of chlamydia     12/2008; 03/09/2009  . Anxiety   . Depression   . Asthma     uses inhaler, last used 8/22  . 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 No past surgeries     Family History  Problem Relation Age of Onset  . Other Neg Hx   . Hypertension Mother   . Asthma Maternal Grandmother    Social History  Substance Use Topics  . Smoking status: Current Every Day Smoker -- 0.25 packs/day  . Smokeless tobacco: Never Used  . Alcohol Use: 0.0 oz/week    0 Standard drinks or equivalent per week     Comment: Rare   OB History    Gravida Para Term Preterm AB TAB SAB Ectopic Multiple Living   0 2     Review of Systems  Ten systems reviewed and are  negative for acute change, except as noted in the HPI.    Allergies  Eggs or egg-derived products and Latex  Home Medications   Prior to Admission medications   Medication Sig Start Date End Date Taking? Authorizing Provider  buPROPion (WELLBUTRIN SR) 100 MG 12 hr tablet Take 1 tablet (100 mg total) by mouth 2 (two) times daily. Patient not taking: Reported on 01/12/2016 07/12/15   Rachelle A Denney, CNM  fluconazole (DIFLUCAN) 100 MG tablet Take 1 tablet (100 mg total) by mouth once. Repeat dose in 48-72 hour. 01/12/16   Rachelle A Denney, CNM  ibuprofen (ADVIL,MOTRIN) 800 MG tablet Take 1 tablet (800 mg total) by mouth every 8 (eight) hours as needed. 08/11/15   Rachelle A Denney, CNM  valACYclovir (VALTREX) 500 MG tablet Take 1 tablet (500 mg total) by mouth daily. May take 500 mg twice daily for 3 days for episodic flare 12/19/15   Rachelle A Denney, CNM   BP 131/79 mmHg  Pulse 90  Temp(Src) 98.1 F (36.7 C) (Oral)  Resp 22  SpO2 100%  LMP 03/10/2016 Physical Exam  Constitutional: She is oriented to person, place, and time. She appears well-developed and well-nourished. No distress.  Well appearing female. Patient is audibly wheezing, speaking in full, but labored sentences.  HENT:  Head:  Normocephalic and atraumatic.  Eyes: Conjunctivae are normal. No scleral icterus.  Neck: Normal range of motion.  Cardiovascular: Normal rate, regular rhythm and normal heart sounds.  Exam reveals no gallop and no friction rub.   No murmur heard. Pulmonary/Chest: Effort normal. No respiratory distress. She has wheezes. She has no rales.  Prolonged expiratory phase  Abdominal: Soft. Bowel sounds are normal. She exhibits no distension and no mass. There is no tenderness. There is no guarding.  Neurological: She is alert and oriented to person, place, and time.  Skin: Skin is warm and dry. She is not diaphoretic.  Nursing note and vitals reviewed.   ED Course  Procedures (including critical care  time) Labs Review Labs Reviewed  CBC - Abnormal; Notable for the following:    RBC 3.72 (*)    Hemoglobin 11.1 (*)    HCT 32.9 (*)    All other components within normal limits  BASIC METABOLIC PANEL    Imaging Review No results found. I have personally reviewed and evaluated these images and lab results as part of my medical decision-making.   EKG Interpretation None      MDM   Final diagnoses:  None    7:05 AM BP 131/79 mmHg  Pulse 90  Temp(Src) 98.1 F (36.7 C) (Oral)  Resp 22  SpO2 98%  LMP 03/10/2016 Patient with wheezing and chest tightness. The patient will receive a CAT, magnesium, methylprednisolone, and then reevaluation. Blood work, chest x-ray pending.  8:02 AM BP 143/85 mmHg  Pulse 83  Temp(Src) 98.1 F (36.7 C) (Oral)  Resp 24  SpO2 100%  LMP 03/10/2016 Patient about half way through her treatment and still has sig Wheezing,   Patient finished with treatment with insp /exp wheezing. SHe is not at baseline. She is ambulating without a drop in her 02, however still having difficulty moving air. We will repeat a CAT.    Paitent finished second hour long. She is feeling improved but shaky after albuterol. Will d/c with tessalon, prednisone and albuterol. Discussed return precautions and reasons to seek immediate medical care. Appears safe for discharge at this time.  Arthor CaptainAbigail Ichiro Chesnut, PA-C 03/31/16 1656  Rolan BuccoMelanie Belfi, MD 04/09/16 [phone]

## 2016-03-28 NOTE — ED Notes (Signed)
Pt c/o generalized body aches and nasal congestion x 3 days.  

## 2016-03-28 NOTE — Discharge Instructions (Signed)
Bronchospasm, Adult A bronchospasm is a spasm or tightening of the airways going into the lungs. During a bronchospasm breathing becomes more difficult because the airways get smaller. When this happens there can be coughing, a whistling sound when breathing (wheezing), and difficulty breathing. Bronchospasm is often associated with asthma, but not all patients who experience a bronchospasm have asthma. CAUSES  A bronchospasm is caused by inflammation or irritation of the airways. The inflammation or irritation may be triggered by:   Allergies (such as to animals, pollen, food, or mold). Allergens that cause bronchospasm may cause wheezing immediately after exposure or many hours later.   Infection. Viral infections are believed to be the most common cause of bronchospasm.   Exercise.   Irritants (such as pollution, cigarette smoke, strong odors, aerosol sprays, and paint fumes).   Weather changes. Winds increase molds and pollens in the air. Rain refreshes the air by washing irritants out. Cold air may cause inflammation.   Stress and emotional upset.  SIGNS AND SYMPTOMS   Wheezing.   Excessive nighttime coughing.   Frequent or severe coughing with a simple cold.   Chest tightness.   Shortness of breath.  DIAGNOSIS  Bronchospasm is usually diagnosed through a history and physical exam. Tests, such as chest X-rays, are sometimes done to look for other conditions. TREATMENT   Inhaled medicines can be given to open up your airways and help you breathe. The medicines can be given using either an inhaler or a nebulizer machine.  Corticosteroid medicines may be given for severe bronchospasm, usually when it is associated with asthma. HOME CARE INSTRUCTIONS   Always have a plan prepared for seeking medical care. Know when to call your health care provider and local emergency services (911 in the [place] Know where you can access local emergency care.  Only take medicines as  directed by your health care provider.  If you were prescribed an inhaler or nebulizer machine, ask your health care provider to explain how to use it correctly. Always use a spacer with your inhaler if you were given one.  It is necessary to remain calm during an attack. Try to relax and breathe more slowly.  Control your home environment in the following ways:   Change your heating and air conditioning filter at least once a month.   Limit your use of fireplaces and wood stoves.  Do not smoke and do not allow smoking in your home.   Avoid exposure to perfumes and fragrances.   Get rid of pests (such as roaches and mice) and their droppings.   Throw away plants if you see mold on them.   Keep your house clean and dust free.   Replace carpet with wood, tile, or vinyl flooring. Carpet can trap dander and dust.   Use allergy-proof pillows, mattress covers, and box spring covers.   Wash bed sheets and blankets every week in hot water and dry them in a dryer.   Use blankets that are made of polyester or cotton.   Wash hands frequently. SEEK MEDICAL CARE IF:   You have muscle aches.   You have chest pain.   The sputum changes from clear or white to yellow, green, gray, or bloody.   The sputum you cough up gets thicker.   There are problems that may be related to the medicine you are given, such as a rash, itching, swelling, or trouble breathing.  SEEK IMMEDIATE MEDICAL CARE IF:   You have worsening wheezing and coughing  even after taking your prescribed medicines.   You have increased difficulty breathing.   You develop severe chest pain. MAKE SURE YOU:   Understand these instructions.  Will watch your condition.  Will get help right away if you are not doing well or get worse.   This information is not intended to replace advice given to you by your health care provider. Make sure you discuss any questions you have with your health care  provider.   Document Released: 12/05/2003 Document Revised: 12/23/2014 Document Reviewed: 05/24/2013 Elsevier Interactive Patient Education 2016 Elsevier Inc.  Asthma, Adult Asthma is a recurring condition in which the airways tighten and narrow. Asthma can make it difficult to breathe. It can cause coughing, wheezing, and shortness of breath. Asthma episodes, also called asthma attacks, range from minor to life-threatening. Asthma cannot be cured, but medicines and lifestyle changes can help control it. CAUSES Asthma is believed to be caused by inherited (genetic) and environmental factors, but its exact cause is unknown. Asthma may be triggered by allergens, lung infections, or irritants in the air. Asthma triggers are different for each person. Common triggers include:   Animal dander.  Dust mites.  Cockroaches.  Pollen from trees or grass.  Mold.  Smoke.  Air pollutants such as dust, household cleaners, hair sprays, aerosol sprays, paint fumes, strong chemicals, or strong odors.  Cold air, weather changes, and winds (which increase molds and pollens in the air).  Strong emotional expressions such as crying or laughing hard.  Stress.  Certain medicines (such as aspirin) or types of drugs (such as beta-blockers).  Sulfites in foods and drinks. Foods and drinks that may contain sulfites include dried fruit, potato chips, and sparkling grape juice.  Infections or inflammatory conditions such as the flu, a cold, or an inflammation of the nasal membranes (rhinitis).  Gastroesophageal reflux disease (GERD).  Exercise or strenuous activity. SYMPTOMS Symptoms may occur immediately after asthma is triggered or many hours later. Symptoms include:  Wheezing.  Excessive nighttime or early morning coughing.  Frequent or severe coughing with a common cold.  Chest tightness.  Shortness of breath. DIAGNOSIS  The diagnosis of asthma is made by a review of your medical history  and a physical exam. Tests may also be performed. These may include:  Lung function studies. These tests show how much air you breathe in and out.  Allergy tests.  Imaging tests such as X-rays. TREATMENT  Asthma cannot be cured, but it can usually be controlled. Treatment involves identifying and avoiding your asthma triggers. It also involves medicines. There are 2 classes of medicine used for asthma treatment:   Controller medicines. These prevent asthma symptoms from occurring. They are usually taken every day.  Reliever or rescue medicines. These quickly relieve asthma symptoms. They are used as needed and provide short-term relief. Your health care provider will help you create an asthma action plan. An asthma action plan is a written plan for managing and treating your asthma attacks. It includes a list of your asthma triggers and how they may be avoided. It also includes information on when medicines should be taken and when their dosage should be changed. An action plan may also involve the use of a device called a peak flow meter. A peak flow meter measures how well the lungs are working. It helps you monitor your condition. HOME CARE INSTRUCTIONS   Take medicines only as directed by your health care provider. Speak with your health care provider if you have  questions about how or when to take the medicines.  Use a peak flow meter as directed by your health care provider. Record and keep track of readings.  Understand and use the action plan to help minimize or stop an asthma attack without needing to seek medical care.  Control your home environment in the following ways to help prevent asthma attacks:  Do not smoke. Avoid being exposed to secondhand smoke.  Change your heating and air conditioning filter regularly.  Limit your use of fireplaces and wood stoves.  Get rid of pests (such as roaches and mice) and their droppings.  Throw away plants if you see mold on  them.  Clean your floors and dust regularly. Use unscented cleaning products.  Try to have someone else vacuum for you regularly. Stay out of rooms while they are being vacuumed and for a short while afterward. If you vacuum, use a dust mask from a hardware store, a double-layered or microfilter vacuum cleaner bag, or a vacuum cleaner with a HEPA filter.  Replace carpet with wood, tile, or vinyl flooring. Carpet can trap dander and dust.  Use allergy-proof pillows, mattress covers, and box spring covers.  Wash bed sheets and blankets every week in hot water and dry them in a dryer.  Use blankets that are made of polyester or cotton.  Clean bathrooms and kitchens with bleach. If possible, have someone repaint the walls in these rooms with mold-resistant paint. Keep out of the rooms that are being cleaned and painted.  Wash hands frequently. SEEK MEDICAL CARE IF:   You have wheezing, shortness of breath, or a cough even if taking medicine to prevent attacks.  The colored mucus you cough up (sputum) is thicker than usual.  Your sputum changes from clear or white to yellow, green, gray, or bloody.  You have any problems that may be related to the medicines you are taking (such as a rash, itching, swelling, or trouble breathing).  You are using a reliever medicine more than 2-3 times per week.  Your peak flow is still at 50-79% of your personal best after following your action plan for 1 hour.  You have a fever. SEEK IMMEDIATE MEDICAL CARE IF:   You seem to be getting worse and are unresponsive to treatment during an asthma attack.  You are short of breath even at rest.  You get short of breath when doing very little physical activity.  You have difficulty eating, drinking, or talking due to asthma symptoms.  You develop chest pain.  You develop a fast heartbeat.  You have a bluish color to your lips or fingernails.  You are light-headed, dizzy, or faint.  Your peak flow  is less than 50% of your personal best.   This information is not intended to replace advice given to you by your health care provider. Make sure you discuss any questions you have with your health care provider.   Document Released: 12/02/2005 Document Revised: 08/23/2015 Document Reviewed: 07/01/2013 Elsevier Interactive Patient Education [employer]2016 Elsevier Inc.

## 2016-03-28 NOTE — ED Notes (Signed)
Pt ambulated to the bathroom and back. Pt reported "feeling okay." No obvious SOB. Oxygen Saturation 99% on RA, HR 120. Pt speaking full sentences and helped back into the bed.

## 2016-03-28 NOTE — ED Notes (Signed)
PA Harris at the bedside

## 2016-03-28 NOTE — ED Notes (Signed)
Patient transported to X-ray 

## 2016-03-28 NOTE — ED Notes (Signed)
PA at the bedside.

## 2016-04-25 ENCOUNTER — Telehealth: Payer: Self-pay | Admitting: *Deleted

## 2016-04-25 ENCOUNTER — Other Ambulatory Visit: Payer: Self-pay | Admitting: Certified Nurse Midwife

## 2016-04-25 DIAGNOSIS — J4520 Mild intermittent asthma, uncomplicated: Secondary | ICD-10-CM

## 2016-04-25 MED ORDER — PROVENTIL HFA 108 (90 BASE) MCG/ACT IN AERS: 1.0000 | INHALATION_SPRAY | Freq: Four times a day (QID) | RESPIRATORY_TRACT | Status: DC | PRN

## 2016-04-25 NOTE — Telephone Encounter (Signed)
Patient is requesting a refill on her inhaler. She does not have a primary provider and she needs it. (Patient last got Rx'd at ED) Told patient that I would see if Boykin ReaperRachelle could refill her and refer her to a primary care- she has [employer] now as her insurance.

## 2016-04-25 NOTE — Telephone Encounter (Signed)
Please let her know that the referral was sent and that a refill of the inhaler was also sent.  Thank you.  R.Summer Parthasarathy CNM

## 2016-05-01 ENCOUNTER — Encounter: Payer: Self-pay | Admitting: *Deleted

## 2016-05-28 ENCOUNTER — Ambulatory Visit: Payer: BLUE CROSS/BLUE SHIELD | Admitting: Allergy and Immunology

## 2016-06-18 ENCOUNTER — Encounter (HOSPITAL_COMMUNITY): Payer: Self-pay | Admitting: *Deleted

## 2016-06-18 ENCOUNTER — Inpatient Hospital Stay (HOSPITAL_COMMUNITY)
Admission: AD | Admit: 2016-06-18 | Discharge: 2016-06-18 | Disposition: A | Discharge status: Home / Self Care | Payer: BLUE CROSS/BLUE SHIELD | Source: Ambulatory Visit | Attending: Obstetrics & Gynecology | Admitting: Obstetrics & Gynecology

## 2016-06-18 DIAGNOSIS — J45909 Unspecified asthma, uncomplicated: Secondary | ICD-10-CM | POA: Diagnosis not present

## 2016-06-18 DIAGNOSIS — Z113 Encounter for screening for infections with a predominantly sexual mode of transmission: Secondary | ICD-10-CM | POA: Insufficient documentation

## 2016-06-18 DIAGNOSIS — N760 Acute vaginitis: Secondary | ICD-10-CM | POA: Diagnosis not present

## 2016-06-18 DIAGNOSIS — A499 Bacterial infection, unspecified: Secondary | ICD-10-CM | POA: Diagnosis not present

## 2016-06-18 DIAGNOSIS — F17210 Nicotine dependence, cigarettes, uncomplicated: Secondary | ICD-10-CM | POA: Diagnosis not present

## 2016-06-18 DIAGNOSIS — B9689 Other specified bacterial agents as the cause of diseases classified elsewhere: Secondary | ICD-10-CM

## 2016-06-18 DIAGNOSIS — J4520 Mild intermittent asthma, uncomplicated: Secondary | ICD-10-CM

## 2016-06-18 DIAGNOSIS — Z3202 Encounter for pregnancy test, result negative: Secondary | ICD-10-CM | POA: Insufficient documentation

## 2016-06-18 DIAGNOSIS — N898 Other specified noninflammatory disorders of vagina: Secondary | ICD-10-CM | POA: Diagnosis present

## 2016-06-18 LAB — URINALYSIS, ROUTINE W REFLEX MICROSCOPIC
Bilirubin Urine: NEGATIVE
Glucose, UA: NEGATIVE mg/dL
HGB URINE DIPSTICK: NEGATIVE
Ketones, ur: NEGATIVE mg/dL
Leukocytes, UA: NEGATIVE
Nitrite: NEGATIVE
Protein, ur: NEGATIVE mg/dL
pH: 5 (ref 5.0–8.0)

## 2016-06-18 LAB — WET PREP, GENITAL
Sperm: NONE SEEN
Trich, Wet Prep: NONE SEEN
Yeast Wet Prep HPF POC: NONE SEEN

## 2016-06-18 LAB — POCT PREGNANCY, URINE: PREG TEST UR: NEGATIVE

## 2016-06-18 MED ORDER — ALBUTEROL SULFATE HFA 108 (90 BASE) MCG/ACT IN AERS: 1.0000 | INHALATION_SPRAY | Freq: Four times a day (QID) | RESPIRATORY_TRACT | Status: DC | PRN

## 2016-06-18 MED ORDER — METRONIDAZOLE 0.75 % VA GEL: 1.0000 | Freq: Every day | VAGINAL | Status: AC

## 2016-06-18 NOTE — MAU Provider Note (Signed)
History     CSN: [id number]  Arrival date and time: 06/18/16 1742   First Provider Initiated Contact with Patient 06/18/16 1809      Chief Complaint  Patient presents with  . Vaginal Discharge   HPI  Lauren Conway is a 25 y.o. [id number] with Paragard.  She presents with change in discharge- thick yellow with fishy odor. She denies any itching or irritation. No UTI S&S. New partner, they decided not to use condoms. 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 She requests full STD screening. She also request breathing treatment- hx asthma, no inhaler. She has cough, nasal congestion- would just feel better.  She stopped smoking yesterday, is helping!   OB History    Gravida Para Term Preterm AB TAB SAB Ectopic Multiple Living   3 2 2  1  1   0 2      Past Medical History  Diagnosis Date  . History of gonorrhea     12/2008; 09/2010;   . History of chlamydia     12/2008; 03/09/2009  . Anxiety   . Depression   . Asthma     uses inhaler, last used 8/22  . 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 No past surgeries      Family History  Problem Relation Age of Onset  . Other Neg Hx   . Hypertension Mother   . Asthma Maternal Grandmother     Social History  Substance Use Topics  . Smoking status: Current Every Day Smoker -- 0.25 packs/day  . Smokeless tobacco: Never Used  . Alcohol Use: 0.0 oz/week    0 Standard drinks or equivalent per week     Comment: Rare    Allergies:  Allergies  Allergen Reactions  . Eggs Or Egg-Derived Products Hives and Swelling  . Latex Itching and Swelling    Prescriptions prior to admission  Medication Sig Dispense Refill Last Dose  . benzonatate (TESSALON) 100 MG capsule Take 2 capsules (200 mg total) by mouth 2 (two) times daily as needed for cough. 20 capsule 0   . Dextromethorphan Polistirex (DELSYM PO) Take 1 Dose by mouth every 8 (eight) hours as needed (cold  symptoms).   03/27/2016 at Unknown time  . ibuprofen (ADVIL,MOTRIN) 800 MG tablet Take 1 tablet (800 mg total) by mouth every 8 (eight) hours as needed. 90 tablet 4 03/27/2016 at Unknown time  . Phenylephrine-Ibuprofen (CONGESTION RELIEF PO) Take 1 tablet by mouth 2 (two) times daily as needed (congestion).   03/27/2016 at Unknown time  . predniSONE (DELTASONE) 20 MG tablet Take 2 tablets (40 mg total) by mouth daily. 10 tablet 0   . PROVENTIL HFA 108 (90 Base) MCG/ACT inhaler Inhale 1-2 puffs into the lungs every 6 (six) hours as needed. 18 g 2     Review of Systems  Constitutional: Negative for fever and chills.  Respiratory: Positive for cough. Negative for shortness of breath and wheezing.   Gastrointestinal: Negative for abdominal pain.  Genitourinary: Negative for dysuria, urgency and frequency.       Vaginal discharge with odor No itching or irritation   Physical Exam   Blood pressure 117/65, pulse 97, temperature 98.5 F (36.9 C), temperature source Oral, resp. rate 16, last menstrual period 06/01/2016, not currently breastfeeding.  Physical Exam  Nursing note and vitals reviewed. Constitutional: She is oriented to person, place, and time. She appears  well-developed and well-nourished. No distress.  Respiratory: Effort normal. No respiratory distress. She has wheezes (sl wheezing, clears with coughing).  GI: Soft. There is no tenderness.  Genitourinary:  Pelvic exam: Ext gen- normal anatomy, skin intact Vagina- small amt thick white discharge Cx- parous with 2 white strings through os Uterus- nl size, more tender per pt Adn- no masses palp, non tender  Musculoskeletal: Normal range of motion.  Neurological: She is alert and oriented to person, place, and time.  Skin: Skin is warm and dry.  Psychiatric: She has a normal mood and affect. Her behavior is normal.    MAU Course  Procedures  MDM Results for orders placed or performed during the hospital encounter of 06/18/16  (from the past 24 hour(s))  Urinalysis, Routine w reflex microscopic (not at [facility])     Status: Abnormal   Collection Time: 06/18/16  5:48 PM  Result Value Ref Range   Color, Urine YELLOW YELLOW   APPearance CLEAR CLEAR   Specific Gravity, Urine >1.030 (H) 1.005 - 1.030   pH 5.0 5.0 - 8.0   Glucose, UA NEGATIVE NEGATIVE mg/dL   Hgb urine dipstick NEGATIVE NEGATIVE   Bilirubin Urine NEGATIVE NEGATIVE   Ketones, ur NEGATIVE NEGATIVE mg/dL   Protein, ur NEGATIVE NEGATIVE mg/dL   Nitrite NEGATIVE NEGATIVE   Leukocytes, UA NEGATIVE NEGATIVE  Pregnancy, urine POC     Status: None   Collection Time: 06/18/16  6:09 PM  Result Value Ref Range   Preg Test, Ur NEGATIVE NEGATIVE  Wet prep, genital     Status: Abnormal   Collection Time: 06/18/16  6:27 PM  Result Value Ref Range   Yeast Wet Prep HPF POC NONE SEEN NONE SEEN   Trich, Wet Prep NONE SEEN NONE SEEN   Clue Cells Wet Prep HPF POC PRESENT (A) NONE SEEN   WBC, Wet Prep HPF POC FEW (A) NONE SEEN   Sperm NONE SEEN      Assessment and Plan  BV- tx with Metrogel Hx asthma, no inhaler, has cough but no SOB or respiratory distress- will give Rx for albuterol for her to use STD screen pending, safe sex reviewed Encouraged f/u with her PCP   Rodell PernaHARRIS, Shirlene Andaya M. 06/18/2016, 6:34 PM

## 2016-06-18 NOTE — MAU Note (Signed)
Pt states that for one week she has been having discharge with a smell.  Pt was diagnosed with HSV 1 on January 3rd.  Pt states she has had sex since then but she has been using condoms.  Recently she has been having sex with someone who they haven't been using condoms.  Pt states when she starts having new partners she commonly gets BV.  Pt denies itching but states she is having discharge.  Pt states sometimes she spots between her periods and the blood has a smell too.  Pt wants STD screening today as well.  Pt also wants more educational material about HSV 1.

## 2016-06-19 LAB — HIV ANTIBODY (ROUTINE TESTING W REFLEX): HIV Screen 4th Generation wRfx: NONREACTIVE

## 2016-06-19 LAB — GC/CHLAMYDIA PROBE AMP (~~LOC~~) NOT AT ARMC
Chlamydia: NEGATIVE
Neisseria Gonorrhea: NEGATIVE

## 2016-06-20 LAB — RPR: RPR Ser Ql: NONREACTIVE

## 2016-07-02 ENCOUNTER — Ambulatory Visit: Payer: BLUE CROSS/BLUE SHIELD | Admitting: Allergy and Immunology

## 2016-09-10 ENCOUNTER — Encounter (HOSPITAL_COMMUNITY): Payer: Self-pay | Admitting: *Deleted

## 2016-09-10 ENCOUNTER — Inpatient Hospital Stay (HOSPITAL_COMMUNITY)
Admission: AD | Admit: 2016-09-10 | Discharge: 2016-09-10 | Disposition: A | Discharge status: Home / Self Care | Payer: BLUE CROSS/BLUE SHIELD | Source: Ambulatory Visit | Attending: Family Medicine | Admitting: Family Medicine

## 2016-09-10 DIAGNOSIS — N730 Acute parametritis and pelvic cellulitis: Secondary | ICD-10-CM | POA: Diagnosis not present

## 2016-09-10 DIAGNOSIS — F17210 Nicotine dependence, cigarettes, uncomplicated: Secondary | ICD-10-CM | POA: Insufficient documentation

## 2016-09-10 DIAGNOSIS — N898 Other specified noninflammatory disorders of vagina: Secondary | ICD-10-CM | POA: Diagnosis present

## 2016-09-10 DIAGNOSIS — Z113 Encounter for screening for infections with a predominantly sexual mode of transmission: Secondary | ICD-10-CM | POA: Insufficient documentation

## 2016-09-10 LAB — POCT PREGNANCY, URINE: Preg Test, Ur: NEGATIVE

## 2016-09-10 LAB — CBC
HEMATOCRIT: 35.7 % — AB (ref 36.0–46.0)
Hemoglobin: 12.1 g/dL (ref 12.0–15.0)
MCH: 30.3 pg (ref 26.0–34.0)
MCHC: 33.9 g/dL (ref 30.0–36.0)
MCV: 89.3 fL (ref 78.0–100.0)
Platelets: 345 10*3/uL (ref 150–400)
RBC: 4 MIL/uL (ref 3.87–5.11)
RDW: 14 % (ref 11.5–15.5)
WBC: 7.5 10*3/uL (ref 4.0–10.5)

## 2016-09-10 LAB — WET PREP, GENITAL
CLUE CELLS WET PREP: NONE SEEN
Sperm: NONE SEEN
TRICH WET PREP: NONE SEEN
Yeast Wet Prep HPF POC: NONE SEEN

## 2016-09-10 LAB — URINE MICROSCOPIC-ADD ON

## 2016-09-10 LAB — URINALYSIS, ROUTINE W REFLEX MICROSCOPIC
BILIRUBIN URINE: NEGATIVE
GLUCOSE, UA: NEGATIVE mg/dL
KETONES UR: NEGATIVE mg/dL
Leukocytes, UA: NEGATIVE
Nitrite: NEGATIVE
PH: 5.5 (ref 5.0–8.0)
PROTEIN: NEGATIVE mg/dL
Specific Gravity, Urine: >1.03 — ABNORMAL HIGH (ref 1.005–1.030)

## 2016-09-10 MED ORDER — DOXYCYCLINE HYCLATE 100 MG PO TABS: 100.0000 mg | ORAL_TABLET | Freq: Two times a day (BID) | ORAL | 0 refills | Status: DC

## 2016-09-10 MED ORDER — CEFTRIAXONE SODIUM 250 MG IJ SOLR
250.0000 mg | Freq: Once | INTRAMUSCULAR | Status: AC
  Administered 2016-09-10: 250 mg via INTRAMUSCULAR
  Filled 2016-09-10: qty 250

## 2016-09-10 MED ORDER — KETOROLAC TROMETHAMINE 60 MG/2ML IM SOLN
60.0000 mg | Freq: Once | INTRAMUSCULAR | Status: AC
  Administered 2016-09-10: 60 mg via INTRAMUSCULAR
  Filled 2016-09-10: qty 2

## 2016-09-10 NOTE — MAU Note (Signed)
Had been dealing with somebody since the end of July.  [place] through  Dealing with him, started having d/c and spotting. Has increased, and is having cramping now.  Has a Paragard.

## 2016-09-10 NOTE — Discharge Instructions (Signed)
Pelvic Inflammatory Disease °Pelvic inflammatory disease (PID) refers to an infection in some or all of the female organs. The infection can be in the uterus, ovaries, fallopian tubes, or the surrounding tissues in the pelvis. PID can cause abdominal or pelvic pain that comes on suddenly (acute pelvic pain). PID is a serious infection because it can lead to lasting (chronic) pelvic pain or the inability to have children (infertility). °CAUSES °This condition is most often caused by an infection that is spread during sexual contact. However, the infection can also be caused by the normal bacteria that are found in the vaginal tissues if these bacteria travel upward into the reproductive organs. PID can also occur following: °· The birth of a baby. °· A miscarriage. °· An abortion. °· Major pelvic surgery. °· The use of an intrauterine device (IUD). °· A sexual assault. °RISK FACTORS °This condition is more likely to develop in women who: °· Are younger than 25 years of age. °· Are sexually active at a young age. °· Use nonbarrier contraception. °· Have multiple sexual partners. °· Have sex with someone who has symptoms of an STD (sexually transmitted disease). °· Use oral contraception. °At times, certain behaviors can also increase the possibility of getting PID, such as: °· Using a vaginal douche. °· Having an IUD in place. °SYMPTOMS °Symptoms of this condition include: °· Abdominal or pelvic pain. °· Fever. °· Chills. °· Abnormal vaginal discharge. °· Abnormal uterine bleeding. °· Unusual pain shortly after the end of a menstrual period. °· Painful urination. °· Pain with sexual intercourse. °· Nausea and vomiting. °DIAGNOSIS °To diagnose this condition, your health care provider will do a physical exam and take your medical history. A pelvic exam typically reveals great tenderness in the uterus and the surrounding pelvic tissues. You may also have tests, such as: °· Lab tests, including a pregnancy test, blood  tests, and urine test. °· Culture tests of the vagina and cervix to check for an STD. °· Ultrasound. °· A laparoscopic procedure to look inside the pelvis. °· Examining vaginal secretions under a microscope. °TREATMENT °Treatment for this condition may involve one or more approaches. °· Antibiotic medicines may be prescribed to be taken by mouth. °· Sexual partners may need to be treated if the infection is caused by an STD. °· For more severe cases, hospitalization may be needed to give antibiotics directly into a vein through an IV tube. °· Surgery may be needed if other treatments do not help, but this is rare. °It may take weeks until you are completely well. If you are diagnosed with PID, you should also be checked for human immunodeficiency virus (HIV). Your health care provider may test you for infection again 3 months after treatment. You should not have unprotected sex. °HOME CARE INSTRUCTIONS °· Take over-the-counter and prescription medicines only as told by your health care provider. °· If you were prescribed an antibiotic medicine, take it as told by your health care provider. Do not stop taking the antibiotic even if you start to feel better. °· Do not have sexual intercourse until treatment is completed or as told by your health care provider. If PID is confirmed, your recent sexual partners will need treatment, especially if you had unprotected sex. °· Keep all follow-up visits as told by your health care provider. This is important. °SEEK MEDICAL CARE IF: °· You have increased or abnormal vaginal discharge. °· Your pain does not improve. °· You vomit. °· You have a fever. °· You   cannot tolerate your medicines.  Your partner has an STD.  You have pain when you urinate. SEEK IMMEDIATE MEDICAL CARE IF:  You have increased abdominal or pelvic pain.  You have chills.  Your symptoms are not better in 72 hours even with treatment.   This information is not intended to replace advice given to  you by your health care provider. Make sure you discuss any questions you have with your health care provider.   Document Released: 12/02/2005 Document Revised: 08/23/2015 Document Reviewed: 01/09/2015 Elsevier Interactive Patient Education 2016 [employer] Contraception Choices Contraception (birth control) is the use of any methods or devices to prevent pregnancy. Below are some methods to help avoid pregnancy. HORMONAL METHODS   Contraceptive implant. This is a thin, plastic tube containing progesterone hormone. It does not contain estrogen hormone. Your health care provider inserts the tube in the inner part of the upper arm. The tube can remain in place for up to 3 years. After 3 years, the implant must be removed. The implant prevents the ovaries from releasing an egg (ovulation), thickens the cervical mucus to prevent sperm from entering the uterus, and thins the lining of the inside of the uterus.  Progesterone-only injections. These injections are given every 3 months by your health care provider to prevent pregnancy. This synthetic progesterone hormone stops the ovaries from releasing eggs. It also thickens cervical mucus and changes the uterine lining. This makes it harder for sperm to survive in the uterus.  Birth control pills. These pills contain estrogen and progesterone hormone. They work by preventing the ovaries from releasing eggs (ovulation). They also cause the cervical mucus to thicken, preventing the sperm from entering the uterus. Birth control pills are prescribed by a health care provider.Birth control pills can also be used to treat heavy periods.  Minipill. This type of birth control pill contains only the progesterone hormone. They are taken every day of each month and must be prescribed by your health care provider.  Birth control patch. The patch contains hormones similar to those in birth control pills. It must be changed once a week and is prescribed by a health  care provider.  Vaginal ring. The ring contains hormones similar to those in birth control pills. It is left in the vagina for 3 weeks, removed for 1 week, and then a new one is put back in place. The patient must be comfortable inserting and removing the ring from the vagina.A health care provider's prescription is necessary.  Emergency contraception. Emergency contraceptives prevent pregnancy after unprotected sexual intercourse. This pill can be taken right after sex or up to 5 days after unprotected sex. It is most effective the sooner you take the pills after having sexual intercourse. Most emergency contraceptive pills are available without a prescription. Check with your pharmacist. Do not use emergency contraception as your only form of birth control. BARRIER METHODS   Female condom. This is a thin sheath (latex or rubber) that is worn over the penis during sexual intercourse. It can be used with spermicide to increase effectiveness.  Female condom. This is a soft, loose-fitting sheath that is put into the vagina before sexual intercourse.  Diaphragm. This is a soft, latex, dome-shaped barrier that must be fitted by a health care provider. It is inserted into the vagina, along with a spermicidal jelly. It is inserted before intercourse. The diaphragm should be left in the vagina for 6 to 8 hours after intercourse.  Cervical cap. This is a round,  soft, latex or plastic cup that fits over the cervix and must be fitted by a health care provider. The cap can be left in place for up to 48 hours after intercourse.  Sponge. This is a soft, circular piece of polyurethane foam. The sponge has spermicide in it. It is inserted into the vagina after wetting it and before sexual intercourse.  Spermicides. These are chemicals that kill or block sperm from entering the cervix and uterus. They come in the form of creams, jellies, suppositories, foam, or tablets. They do not require a prescription. They are  inserted into the vagina with an applicator before having sexual intercourse. The process must be repeated every time you have sexual intercourse. INTRAUTERINE CONTRACEPTION  Intrauterine device (IUD). This is a T-shaped device that is put in a woman's uterus during a menstrual period to prevent pregnancy. There are 2 types:  Copper IUD. This type of IUD is wrapped in copper wire and is placed inside the uterus. Copper makes the uterus and fallopian tubes produce a fluid that kills sperm. It can stay in place for 10 years.  Hormone IUD. This type of IUD contains the hormone progestin (synthetic progesterone). The hormone thickens the cervical mucus and prevents sperm from entering the uterus, and it also thins the uterine lining to prevent implantation of a fertilized egg. The hormone can weaken or kill the sperm that get into the uterus. It can stay in place for 3-5 years, depending on which type of IUD is used. PERMANENT METHODS OF CONTRACEPTION  Female tubal ligation. This is when the woman's fallopian tubes are surgically sealed, tied, or blocked to prevent the egg from traveling to the uterus.  Hysteroscopic sterilization. This involves placing a small coil or insert into each fallopian tube. Your doctor uses a technique called hysteroscopy to do the procedure. The device causes scar tissue to form. This results in permanent blockage of the fallopian tubes, so the sperm cannot fertilize the egg. It takes about 3 months after the procedure for the tubes to become blocked. You must use another form of birth control for these 3 months.  Female sterilization. This is when the female has the tubes that carry sperm tied off (vasectomy).This blocks sperm from entering the vagina during sexual intercourse. After the procedure, the man can still ejaculate fluid (semen). NATURAL PLANNING METHODS  Natural family planning. This is not having sexual intercourse or using a barrier method (condom, diaphragm,  cervical cap) on days the woman could become pregnant.  Calendar method. This is keeping track of the length of each menstrual cycle and identifying when you are fertile.  Ovulation method. This is avoiding sexual intercourse during ovulation.  Symptothermal method. This is avoiding sexual intercourse during ovulation, using a thermometer and ovulation symptoms.  Post-ovulation method. This is timing sexual intercourse after you have ovulated. Regardless of which type or method of contraception you choose, it is important that you use condoms to protect against the transmission of sexually transmitted infections (STIs). Talk with your health care provider about which form of contraception is most appropriate for you.   This information is not intended to replace advice given to you by your health care provider. Make sure you discuss any questions you have with your health care provider.   Document Released: 12/02/2005 Document Revised: 12/07/2013 Document Reviewed: 05/27/2013 Elsevier Interactive Patient Education [employer]

## 2016-09-10 NOTE — MAU Provider Note (Signed)
History     CSN: [id number]  Arrival date and time: 09/10/16 1306   First Provider Initiated Contact with Patient 09/10/16 1357      Chief Complaint  Patient presents with  . Abdominal Cramping  . Vaginal Discharge  . Vaginal Bleeding   HPI  Lauren Conway is a 25 y.o. female who presents with vaginal discharge & abdominal cramping. Patient is concerned that she has an STD d/t history & recently finding out that FOB was promiscuous. Current symptoms began 3 weeks ago. Reports lower abdominal cramping that is intermittent. Rates pain 9/10. Has taken tylenol a few days ago for pain without relief. Vaginal discharge that is thick yellow & malodorous with occasional vaginal irritation. Endorses post coital spotting & dyspareunia with last 2 sexual encounters.  Has Paragard IUD that was place postpartum last year at [facility].   OB History    Gravida Para Term Preterm AB Living   3 2 2   1 2    SAB TAB Ectopic Multiple Live Births   1     0 2      Past Medical History:  Diagnosis Date  . Anxiety   . Asthma    uses inhaler, last used 8/22  . Depression   . History of chlamydia    12/2008; 03/09/2009  . History of gonorrhea    12/2008; 09/2010;   . 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 NO PAST SURGERIES      Family History  Problem Relation Age of Onset  . Hypertension Mother   . Asthma Maternal Grandmother   . Other Neg Hx     Social History  Substance Use Topics  . Smoking status: Current Every Day Smoker    Packs/day: 0.25  . Smokeless tobacco: Never Used  . Alcohol use 0.0 oz/week     Comment: Rare    Allergies:  Allergies  Allergen Reactions  . Eggs Or Egg-Derived Products Hives and Swelling  . Latex Itching and Swelling    Prescriptions Prior to Admission  Medication Sig Dispense Refill Last Dose  . acetaminophen (TYLENOL) 500 MG tablet Take 500 mg by mouth every 6 (six) hours as  needed.   Past Week at Unknown time  . albuterol (PROVENTIL HFA;VENTOLIN HFA) 108 (90 Base) MCG/ACT inhaler Inhale 1-2 puffs into the lungs every 6 (six) hours as needed for wheezing or shortness of breath. 1 Inhaler 0 Past Month at Unknown time  . cetirizine (ZYRTEC) 10 MG tablet Take 10 mg by mouth daily.   09/09/2016 at Unknown time  . PROVENTIL HFA 108 (90 Base) MCG/ACT inhaler Inhale 1-2 puffs into the lungs every 6 (six) hours as needed. 18 g 2 Past Month at Unknown time  . valACYclovir (VALTREX) 500 MG tablet TAKE 1 TABLET BY MOUTH EVERY DAY TAKE TWICE A DAY FOR 3 DAYS FOR FLARE  11 Past Month at Unknown time  . benzonatate (TESSALON) 100 MG capsule Take 2 capsules (200 mg total) by mouth 2 (two) times daily as needed for cough. (Patient not taking: Reported on 09/10/2016) 20 capsule 0 Not Taking at Unknown time    Review of Systems  Constitutional: Negative for chills and fever.  Gastrointestinal: Positive for abdominal pain. Negative for constipation, diarrhea, nausea and vomiting.  Genitourinary: Negative for dysuria and frequency.       + postcoital bleeding & dyspareunia + vaginal discharge   Physical Exam  Blood pressure 134/74, pulse 81, temperature 98.2 F (36.8 C), temperature source Oral, resp. rate 16, weight 172 lb 12 oz (78.4 kg), last menstrual period 08/19/2016, not currently breastfeeding.  Physical Exam  Nursing note and vitals reviewed. Constitutional: She is oriented to person, place, and time. She appears well-developed and well-nourished. No distress.  HENT:  Head: Normocephalic and atraumatic.  Eyes: Conjunctivae are normal. Right eye exhibits no discharge. Left eye exhibits no discharge. No scleral icterus.  Neck: Normal range of motion.  Cardiovascular: Normal rate, regular rhythm and normal heart sounds.   No murmur heard. Respiratory: Effort normal and breath sounds normal. No respiratory distress. She has no wheezes.  GI: Soft. Bowel sounds are normal.  She exhibits no distension. There is no tenderness. There is no rebound.  Genitourinary: Cervix exhibits motion tenderness, discharge and friability.  Genitourinary Comments: Small amount of purulent discharge at cervical os. IUD removed without complication   Neurological: She is alert and oriented to person, place, and time.  Skin: Skin is warm and dry. She is not diaphoretic.  Psychiatric: She has a normal mood and affect. Her behavior is normal. Judgment and thought content normal.    MAU Course  Procedures Results for orders placed or performed during the hospital encounter of 09/10/16 (from the past 24 hour(s))  Urinalysis, Routine w reflex microscopic (not at [facility])     Status: Abnormal   Collection Time: 09/10/16  1:25 PM  Result Value Ref Range   Color, Urine YELLOW YELLOW   APPearance CLEAR CLEAR   Specific Gravity, Urine >1.030 (H) 1.005 - 1.030   pH 5.5 5.0 - 8.0   Glucose, UA NEGATIVE NEGATIVE mg/dL   Hgb urine dipstick TRACE (A) NEGATIVE   Bilirubin Urine NEGATIVE NEGATIVE   Ketones, ur NEGATIVE NEGATIVE mg/dL   Protein, ur NEGATIVE NEGATIVE mg/dL   Nitrite NEGATIVE NEGATIVE   Leukocytes, UA NEGATIVE NEGATIVE  Urine microscopic-add on     Status: Abnormal   Collection Time: 09/10/16  1:25 PM  Result Value Ref Range   Squamous Epithelial / LPF 6-30 (A) NONE SEEN   WBC, UA 0-5 0 - 5 WBC/hpf   RBC / HPF 0-5 0 - 5 RBC/hpf   Bacteria, UA FEW (A) NONE SEEN   Urine-Other MUCOUS PRESENT   Pregnancy, urine POC     Status: None   Collection Time: 09/10/16  1:35 PM  Result Value Ref Range   Preg Test, Ur NEGATIVE NEGATIVE  Wet prep, genital     Status: Abnormal   Collection Time: 09/10/16  2:21 PM  Result Value Ref Range   Yeast Wet Prep HPF POC NONE SEEN NONE SEEN   Trich, Wet Prep NONE SEEN NONE SEEN   Clue Cells Wet Prep HPF POC NONE SEEN NONE SEEN   WBC, Wet Prep HPF POC MODERATE (A) NONE SEEN   Sperm NONE SEEN   CBC     Status: Abnormal   Collection Time:  09/10/16  2:48 PM  Result Value Ref Range   WBC 7.5 4.0 - 10.5 K/uL   RBC 4.00 3.87 - 5.11 MIL/uL   Hemoglobin 12.1 12.0 - 15.0 g/dL   HCT 16.1 (L) 09.6 - 04.5 %   MCV 89.3 78.0 - 100.0 fL   MCH 30.3 26.0 - 34.0 pg   MCHC 33.9 30.0 - 36.0 g/dL   RDW 40.9 81.1 - 91.4 %   Platelets 345 150 - 400 K/uL    MDM UPT negative CBC, HIV, RPR, GC/CT,  wet prep Toradol 60 mg IM IUD removed during exam d/t concern for PID & per patient request Rocephin 250 mg IM Assessment and Plan  A: 1. PID (acute pelvic inflammatory disease)   2. Screen for STD (sexually transmitted disease)     P: Discharge home Rx doxycycline GC/CT, HIV, RPR pending Call [facility] to schedule follow up appointment  & to discuss contraception management Use condoms to prevent intercourse & STDs  Judeth Hornrin Melanee Cordial 09/10/2016, 1:57 PM

## 2016-09-11 LAB — HIV ANTIBODY (ROUTINE TESTING W REFLEX): HIV Screen 4th Generation wRfx: NONREACTIVE

## 2016-09-11 LAB — RPR: RPR: NONREACTIVE

## 2016-09-11 LAB — GC/CHLAMYDIA PROBE AMP (~~LOC~~) NOT AT ARMC
Chlamydia: NEGATIVE
NEISSERIA GONORRHEA: NEGATIVE

## 2016-09-27 ENCOUNTER — Telehealth: Payer: Self-pay | Admitting: *Deleted

## 2016-09-27 DIAGNOSIS — Z30011 Encounter for initial prescription of contraceptive pills: Secondary | ICD-10-CM

## 2016-09-27 MED ORDER — NORETHIN ACE-ETH ESTRAD-FE 1-20 MG-MCG(24) PO CAPS: 1.0000 | ORAL_CAPSULE | Freq: Every day | ORAL | 0 refills | Status: DC

## 2016-09-27 NOTE — Telephone Encounter (Signed)
Patient has multiple questions about her hospital visit- she wants to know if she can get birth control until she comes in for her annual exam, she wants to discuss how often she should get HIV testing and she wants to know about taking her Valtrex.  Per Dr Clearance CootsHarper- She may be given birth control pills until she comes for her annual exam.  Reviewed HIV testing timing and how to continue her Valtrex.

## 2016-10-02 ENCOUNTER — Encounter (HOSPITAL_COMMUNITY): Payer: Self-pay

## 2016-10-02 ENCOUNTER — Inpatient Hospital Stay (HOSPITAL_COMMUNITY)
Admission: AD | Admit: 2016-10-02 | Discharge: 2016-10-02 | Disposition: A | Discharge status: Home / Self Care | Payer: BLUE CROSS/BLUE SHIELD | Source: Ambulatory Visit | Attending: Family Medicine | Admitting: Family Medicine

## 2016-10-02 DIAGNOSIS — Z202 Contact with and (suspected) exposure to infections with a predominantly sexual mode of transmission: Secondary | ICD-10-CM | POA: Diagnosis not present

## 2016-10-02 DIAGNOSIS — F17210 Nicotine dependence, cigarettes, uncomplicated: Secondary | ICD-10-CM | POA: Diagnosis not present

## 2016-10-02 DIAGNOSIS — J45909 Unspecified asthma, uncomplicated: Secondary | ICD-10-CM | POA: Diagnosis not present

## 2016-10-02 DIAGNOSIS — N739 Female pelvic inflammatory disease, unspecified: Secondary | ICD-10-CM

## 2016-10-02 DIAGNOSIS — Z79899 Other long term (current) drug therapy: Secondary | ICD-10-CM

## 2016-10-02 LAB — URINALYSIS, ROUTINE W REFLEX MICROSCOPIC
BILIRUBIN URINE: NEGATIVE
Glucose, UA: NEGATIVE mg/dL
HGB URINE DIPSTICK: NEGATIVE
KETONES UR: NEGATIVE mg/dL
Leukocytes, UA: NEGATIVE
NITRITE: NEGATIVE
PH: 6 (ref 5.0–8.0)
Protein, ur: NEGATIVE mg/dL
SPECIFIC GRAVITY, URINE: 1.015 (ref 1.005–1.030)

## 2016-10-02 LAB — POCT PREGNANCY, URINE: Preg Test, Ur: NEGATIVE

## 2016-10-02 NOTE — MAU Note (Signed)
Was here on the 26th, for pain and discomfort. All her tests came back neg. Was dx with PID.  Didn't have the money to pick it up until this last Sat.  Still having pain, esp in low back.  If she holds her pee too long, she has more pain in the front.

## 2016-10-02 NOTE — Discharge Instructions (Signed)

## 2016-10-02 NOTE — MAU Provider Note (Signed)
History     CSN: [id number]  Arrival date and time: 10/02/16 1340   None     Chief Complaint  Patient presents with  . Pelvic Inflammatory Disease  . Exposure to STD   HPI   [name] is a 25 y.o. female here in MAU with questions. She is here in MAU with concerns about her recent diagnoses of PID. She was diagnosed with PID on Sept 26, she was give Doxycycline and did not fill it until this past Saturday (10/14).  She is with the same partner who also tested negative for STI's recently. She would like to know if there is a shot she can take instead of continuing the doxycycline.  She had unprotected sex on Oct 7th and it was painful and she is concerned something is still going on.   OB History    Gravida Para Term Preterm AB Living   3 2 2   1 2    SAB TAB Ectopic Multiple Live Births   1     0 2      Past Medical History:  Diagnosis Date  . Anxiety   . Asthma    uses inhaler, last used 8/22  . Depression   . History of chlamydia    12/2008; 03/09/2009  . History of gonorrhea    12/2008; 09/2010;   . 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 NO PAST SURGERIES      Family History  Problem Relation Age of Onset  . Hypertension Mother   . Asthma Maternal Grandmother   . Other Neg Hx     Social History  Substance Use Topics  . Smoking status: Current Every Day Smoker    Packs/day: 0.25    Years: 0.50  . Smokeless tobacco: Never Used  . Alcohol use 0.0 oz/week     Comment: Rare    Allergies:  Allergies  Allergen Reactions  . Eggs Or Egg-Derived Products Hives and Swelling  . Latex Itching and Swelling    Prescriptions Prior to Admission  Medication Sig Dispense Refill Last Dose  . acetaminophen (TYLENOL) 500 MG tablet Take 1,000 mg by mouth every 6 (six) hours as needed for moderate pain.    Past Week at Unknown time  . albuterol (PROVENTIL HFA;VENTOLIN HFA) 108 (90  Base) MCG/ACT inhaler Inhale 1-2 puffs into the lungs every 6 (six) hours as needed for wheezing or shortness of breath. 1 Inhaler 0 10/02/2016 at Unknown time  . ibuprofen (ADVIL,MOTRIN) 800 MG tablet Take 800 mg by mouth every 8 (eight) hours as needed for moderate pain.   Past Week at Unknown time  . valACYclovir (VALTREX) 500 MG tablet TAKE 1 TABLET BY MOUTH EVERY DAY TAKE TWICE A DAY FOR 3 DAYS FOR FLARE  11 10/02/2016 at Unknown time  . doxycycline (VIBRA-TABS) 100 MG tablet Take 1 tablet (100 mg total) by mouth 2 (two) times daily. (Patient not taking: Reported on 10/02/2016) 28 tablet 0 Not Taking at Unknown time  . Norethin Ace-Eth Estrad-FE (TAYTULLA) 1-20 MG-MCG(24) CAPS Take 1 capsule by mouth daily. (Patient not taking: Reported on 10/02/2016) 90 capsule 0 Not Taking at Unknown time  . PROVENTIL HFA 108 (90 Base) MCG/ACT inhaler Inhale 1-2 puffs into the lungs every 6 (six) hours as needed. (Patient not taking: Reported on 10/02/2016) 18 g 2 Not Taking at Unknown time   Results for orders placed or performed  during the hospital encounter of 10/02/16 (from the past 48 hour(s))  Urinalysis, Routine w reflex microscopic (not at [facility])     Status: None   Collection Time: 10/02/16  2:05 PM  Result Value Ref Range   Color, Urine YELLOW YELLOW   APPearance CLEAR CLEAR   Specific Gravity, Urine 1.015 1.005 - 1.030   pH 6.0 5.0 - 8.0   Glucose, UA NEGATIVE NEGATIVE mg/dL   Hgb urine dipstick NEGATIVE NEGATIVE   Bilirubin Urine NEGATIVE NEGATIVE   Ketones, ur NEGATIVE NEGATIVE mg/dL   Protein, ur NEGATIVE NEGATIVE mg/dL   Nitrite NEGATIVE NEGATIVE   Leukocytes, UA NEGATIVE NEGATIVE    Comment: MICROSCOPIC NOT DONE ON URINES WITH NEGATIVE PROTEIN, BLOOD, LEUKOCYTES, NITRITE, OR GLUCOSE <1000 mg/dL.  Pregnancy, urine POC     Status: None   Collection Time: 10/02/16  2:14 PM  Result Value Ref Range   Preg Test, Ur NEGATIVE NEGATIVE    Comment:        THE SENSITIVITY OF THIS METHODOLOGY IS  >24 mIU/mL     Review of Systems  Constitutional: Negative for chills and fever.  Gastrointestinal: Negative for abdominal pain, nausea and vomiting.   Physical Exam   Blood pressure 130/78, pulse 79, temperature 97.8 F (36.6 C), temperature source Oral, resp. rate 18, weight 173 lb 8 oz (78.7 kg), last menstrual period 09/14/2016, not currently breastfeeding.  Physical Exam  Constitutional: She is oriented to person, place, and time. She appears well-developed and well-nourished. No distress.  HENT:  Head: Normocephalic.  Respiratory: Effort normal.  Musculoskeletal: Normal range of motion.  Neurological: She is alert and oriented to person, place, and time.  Skin: Skin is warm. She is not diaphoretic.  Psychiatric: Her behavior is normal.    MAU Course  Procedures  None  MDM Upon review of records on 9/26 the patient had a negative GC swab.  Discussed with the patient the importance of continuing her medication as prescribed. There is no reason at this time to re-test since it has only been 4 weeks and she is still taking the medication for treatment. She has an appointment with her GYN at the end of the month.   Assessment and Plan    A:  1. Medication management     P:  Discharge home in stable condition Continue doxycycline until it is completed Condoms always Follow up with GYN as scheduled   10/02/2016 3:50 PM 3:50 PM

## 2016-10-02 NOTE — MAU Note (Signed)
Patient presents to MAU with questions concerning her PID symptoms (sharp pains in pelvic area/ dull back pain)  However she also stated that "I tried to have sex with a condom, but it hurt so he took it off to see if it would help it not hurt as bad, and that didn't work either but I want to get checked for STD's".

## 2016-10-29 ENCOUNTER — Encounter: Payer: Self-pay | Admitting: Family Medicine

## 2016-11-06 ENCOUNTER — Encounter: Payer: Self-pay | Admitting: Family Medicine

## 2016-11-06 ENCOUNTER — Ambulatory Visit (INDEPENDENT_AMBULATORY_CARE_PROVIDER_SITE_OTHER): Payer: BLUE CROSS/BLUE SHIELD | Admitting: Family Medicine

## 2016-11-06 VITALS — BP 123/78 | HR 78 | Temp 97.1°F | Ht 63.0 in | Wt 173.8 lb

## 2016-11-06 DIAGNOSIS — Z124 Encounter for screening for malignant neoplasm of cervix: Secondary | ICD-10-CM | POA: Diagnosis not present

## 2016-11-06 DIAGNOSIS — Z113 Encounter for screening for infections with a predominantly sexual mode of transmission: Secondary | ICD-10-CM | POA: Diagnosis not present

## 2016-11-06 DIAGNOSIS — Z1151 Encounter for screening for human papillomavirus (HPV): Secondary | ICD-10-CM

## 2016-11-06 DIAGNOSIS — Z01419 Encounter for gynecological examination (general) (routine) without abnormal findings: Secondary | ICD-10-CM | POA: Diagnosis not present

## 2016-11-06 DIAGNOSIS — R634 Abnormal weight loss: Secondary | ICD-10-CM

## 2016-11-06 NOTE — Patient Instructions (Addendum)
Preventive Care 18-39 Years, Female Preventive care refers to lifestyle choices and visits with your health care provider that can promote health and wellness. What does preventive care include?  A yearly physical exam. This is also called an annual well check.  Dental exams once or twice a year.  Routine eye exams. Ask your health care provider how often you should have your eyes checked.  Personal lifestyle choices, including:  Daily care of your teeth and gums.  Regular physical activity.  Eating a healthy diet.  Avoiding tobacco and drug use.  Limiting alcohol use.  Practicing safe sex.  Taking vitamin and mineral supplements as recommended by your health care provider. What happens during an annual well check? The services and screenings done by your health care provider during your annual well check will depend on your age, overall health, lifestyle risk factors, and family history of disease. Counseling  Your health care provider may ask you questions about your:  Alcohol use.  Tobacco use.  Drug use.  Emotional well-being.  Home and relationship well-being.  Sexual activity.  Eating habits.  Work and work environment.  Method of birth control.  Menstrual cycle.  Pregnancy history. Screening  You may have the following tests or measurements:  Height, weight, and BMI.  Diabetes screening. This is done by checking your blood sugar (glucose) after you have not eaten for a while (fasting).  Blood pressure.  Lipid and cholesterol levels. These may be checked every 5 years starting at age 20.  Skin check.  Hepatitis C blood test.  Hepatitis B blood test.  Sexually transmitted disease (STD) testing.  BRCA-related cancer screening. This may be done if you have a family history of breast, ovarian, tubal, or peritoneal cancers.  Pelvic exam and Pap test. This may be done every 3 years starting at age 21. Starting at age 30, this may be done every 5  years if you have a Pap test in combination with an HPV test. Discuss your test results, treatment options, and if necessary, the need for more tests with your health care provider. Vaccines  Your health care provider may recommend certain vaccines, such as:  Influenza vaccine. This is recommended every year.  Tetanus, diphtheria, and acellular pertussis (Tdap, Td) vaccine. You may need a Td booster every 10 years.  Varicella vaccine. You may need this if you have not been vaccinated.  HPV vaccine. If you are 26 or younger, you may need three doses over 6 months.  Measles, mumps, and rubella (MMR) vaccine. You may need at least one dose of MMR. You may also need a second dose.  Pneumococcal 13-valent conjugate (PCV13) vaccine. You may need this if you have certain conditions and were not previously vaccinated.  Pneumococcal polysaccharide (PPSV23) vaccine. You may need one or two doses if you smoke cigarettes or if you have certain conditions.  Meningococcal vaccine. One dose is recommended if you are age 19-21 years and a first-year college student living in a residence hall, or if you have one of several medical conditions. You may also need additional booster doses.  Hepatitis A vaccine. You may need this if you have certain conditions or if you travel or work in places where you may be exposed to hepatitis A.  Hepatitis B vaccine. You may need this if you have certain conditions or if you travel or work in places where you may be exposed to hepatitis B.  Haemophilus influenzae type b (Hib) vaccine. You may need this   if you have certain risk factors. Talk to your health care provider about which screenings and vaccines you need and how often you need them. This information is not intended to replace advice given to you by your health care provider. Make sure you discuss any questions you have with your health care provider. Document Released: 01/28/2002 Document Revised: 08/21/2016  Document Reviewed: 10/03/2015 Elsevier Interactive Patient Education  2017 [place] Sex Safe sex is about reducing the risk of giving or getting a sexually transmitted disease (STD). STDs are spread through sexual contact involving the genitals, mouth, or rectum. Some STDs can be cured and others cannot. Safe sex can also prevent unintended pregnancies.  WHAT ARE SOME SAFE SEX PRACTICES?  Limit your sexual activity to only one partner who is having sex with only you.  Talk to your partner about his or her past partners, past STDs, and drug use.  Use a condom every time you have sexual intercourse. This includes vaginal, oral, and anal sexual activity. Both females and males should wear condoms during oral sex. Only use latex or polyurethane condoms and water-based lubricants. Using petroleum-based lubricants or oils to lubricate a condom will weaken the condom and increase the chance that it will break. The condom should be in place from the beginning to the end of sexual activity. Wearing a condom reduces, but does not completely eliminate, your risk of getting or giving an STD. STDs can be spread by contact with infected body fluids and skin.  Get vaccinated for hepatitis B and HPV.  Avoid alcohol and recreational drugs, which can affect your judgment. You may forget to use a condom or participate in high-risk sex.  For females, avoid douching after sexual intercourse. Douching can spread an infection farther into the reproductive tract.  Check your body for signs of sores, blisters, rashes, or unusual discharge. See your health care provider if you notice any of these signs.  Avoid sexual contact if you have symptoms of an infection or are being treated for an STD. If you or your partner has herpes, avoid sexual contact when blisters are present. Use condoms at all other times.  If you are at risk of being infected with HIV, it is recommended that you take a prescription medicine  daily to prevent HIV infection. This is called pre-exposure prophylaxis (PrEP). You are considered at risk if:  You are a man who has sex with other men (MSM).  You are a heterosexual man or woman who is sexually active with more than one partner.  You take drugs by injection.  You are sexually active with a partner who has HIV.  Talk with your health care provider about whether you are at high risk of being infected with HIV. If you choose to begin PrEP, you should first be tested for HIV. You should then be tested every 3 months for as long as you are taking PrEP.  See your health care provider for regular screenings, exams, and tests for other STDs. Before having sex with a new partner, each of you should be screened for STDs and should talk about the results with each other. WHAT ARE THE BENEFITS OF SAFE SEX?   There is less chance of getting or giving an STD.  You can prevent unwanted or unintended pregnancies.  By discussing safe sex concerns with your partner, you may increase feelings of intimacy, comfort, trust, and honesty between the two of you. This information is not intended to replace advice  given to you by your health care provider. Make sure you discuss any questions you have with your health care provider. Document Released: 01/09/2005 Document Revised: 12/23/2014 Document Reviewed: 10/22/2015 Elsevier Interactive Patient Education  2017 [employer].

## 2016-11-06 NOTE — Progress Notes (Signed)
Patient is in the office for annual exam. LMP 10-13-16.

## 2016-11-06 NOTE — Progress Notes (Signed)
Subjective:     Lauren Conway is a 25 y.o. female and is here for a comprehensive physical exam. The patient reports no problems.Thinks she had some problems related to her IUD. Has migraines and numbness in her hands. Never had these prior to IUD. IUD removed in ED in September.  Has failed patch due to skin issues, difficulty remembering to take her OC or spotting with nuvaring . Prefers to use condoms. Having normal cycles. Desires STD screen. Has lost weight since July, reports frequent BMs. Last pap WNL but with absent transition zone.  Social History   Social History  . Marital status: Single    Spouse name: N/A  . Number of children: N/A  . Years of education: N/A   Occupational History  . Not on file.   Social History Main Topics  . Smoking status: Current Every Day Smoker    Packs/day: 0.25    Years: 0.50  . Smokeless tobacco: Never Used  . Alcohol use 0.0 oz/week     Comment: Rare  . Drug use:     Types: Marijuana     Comment: occasionally  . Sexual activity: Yes    Partners: Male    Birth control/ protection: None   Other Topics Concern  . Not on file   Social History Narrative  . No narrative on file   Health Maintenance  Topic Date Due  . TETANUS/TDAP  02/19/2011  . PAP SMEAR  02/09/2016  . INFLUENZA VACCINE  07/16/2016  . HIV Screening  Completed    The following portions of the patient's history were reviewed and updated as appropriate: allergies, current medications, past family history, past medical history, past social history, past surgical history and problem list.  Review of Systems Pertinent items noted in HPI and remainder of comprehensive ROS otherwise negative.   Objective:    BP 123/78   Pulse 78   Temp 97.1 F (36.2 C) (Oral)   Ht 5\' 3"  (1.6 m)   Wt 173 lb 12.8 oz (78.8 kg)   LMP 10/13/2016 (Exact Date)   Breastfeeding? No   BMI 30.79 kg/m  General appearance: alert, cooperative and appears stated age Head: Normocephalic,  without obvious abnormality, atraumatic Neck: no adenopathy, supple, symmetrical, trachea midline and thyroid not enlarged, symmetric, no tenderness/mass/nodules Lungs: clear to auscultation bilaterally Breasts: normal appearance, no masses or tenderness Heart: regular rate and rhythm, S1, S2 normal, no murmur, click, rub or gallop Abdomen: soft, non-tender; bowel sounds normal; no masses,  no organomegaly Pelvic: cervix normal in appearance, external genitalia normal, no adnexal masses or tenderness, no cervical motion tenderness, uterus normal size, shape, and consistency and vagina normal without discharge Extremities: extremities normal, atraumatic, no cyanosis or edema Pulses: 2+ and symmetric Skin: Skin color, texture, turgor normal. No rashes or lesions Lymph nodes: Cervical, supraclavicular, and axillary nodes normal. Neurologic: Grossly normal    Assessment:    Healthy female exam.      Plan:      Problem List Items Addressed This Visit    None    Visit Diagnoses    Screen for STD (sexually transmitted disease)    -  Primary   Relevant Orders   HIV antibody   RPR   Screening for malignant neoplasm of cervix       Relevant Orders   Cytology - PAP   Encounter for gynecological examination without abnormal finding       Weight loss       Relevant Orders  TSH      See After Visit Summary for Counseling Recommendations

## 2016-11-07 LAB — HIV ANTIBODY (ROUTINE TESTING W REFLEX): HIV SCREEN 4TH GENERATION: NONREACTIVE

## 2016-11-07 LAB — RPR: RPR Ser Ql: NONREACTIVE

## 2016-11-07 LAB — TSH: TSH: 1.22 u[IU]/mL (ref 0.450–4.500)

## 2016-11-11 LAB — CYTOLOGY - PAP
CHLAMYDIA, DNA PROBE: NEGATIVE
DIAGNOSIS: NEGATIVE
Neisseria Gonorrhea: NEGATIVE

## 2016-12-16 NOTE — L&D Delivery Note (Signed)
Patient is a 26 y.o. now [id number] s/p NSVD at 3136w6d, who was admitted for IOL for gHTN. S/p cytotec, FB, and Pitocin. AROM at 10:25am today. GBS ne  Delivery Note At 2:38 PM a viable female was delivered via Vaginal, Spontaneous (Presentation: vertex, LOA).  APGAR: 8, 9; weight  .   Cord: 3-vessel  Head delivered LOA. No nuchal cord present. Shoulder and body delivered in usual fashion. Infant with spontaneous cry, placed on mother's abdomen, dried and bulb suctioned. Cord clamped x 2 after 1-minute delay, and cut by family member. Cord blood drawn. Umbilica cord avulsed, and placenta was manually extracted intact. Fundus firm with massage and Pitocin. Perineum inspected and found to have no laceration.  Anesthesia:  Epidural Episiotomy: None Lacerations: None Suture Repair: none Est. Blood Loss (mL): 100  Mom to postpartum.  Baby to Couplet care / Skin to Skin.  Raynelle FanningJulie P. Degele, MD OB Fellow 10/28/17, 3:06 PM

## 2017-01-03 ENCOUNTER — Encounter (HOSPITAL_COMMUNITY): Payer: Self-pay | Admitting: *Deleted

## 2017-01-03 ENCOUNTER — Inpatient Hospital Stay (HOSPITAL_COMMUNITY)
Admission: AD | Admit: 2017-01-03 | Discharge: 2017-01-03 | Disposition: A | Discharge status: Home / Self Care | Payer: Medicaid Other | Source: Ambulatory Visit | Attending: Obstetrics and Gynecology | Admitting: Obstetrics and Gynecology

## 2017-01-03 ENCOUNTER — Inpatient Hospital Stay (HOSPITAL_COMMUNITY): Payer: Medicaid Other

## 2017-01-03 DIAGNOSIS — Z3A13 13 weeks gestation of pregnancy: Secondary | ICD-10-CM | POA: Diagnosis not present

## 2017-01-03 DIAGNOSIS — J45909 Unspecified asthma, uncomplicated: Secondary | ICD-10-CM | POA: Diagnosis not present

## 2017-01-03 DIAGNOSIS — O99342 Other mental disorders complicating pregnancy, second trimester: Secondary | ICD-10-CM | POA: Diagnosis not present

## 2017-01-03 DIAGNOSIS — F17210 Nicotine dependence, cigarettes, uncomplicated: Secondary | ICD-10-CM | POA: Insufficient documentation

## 2017-01-03 DIAGNOSIS — Z3A01 Less than 8 weeks gestation of pregnancy: Secondary | ICD-10-CM | POA: Diagnosis not present

## 2017-01-03 DIAGNOSIS — Z79899 Other long term (current) drug therapy: Secondary | ICD-10-CM | POA: Diagnosis not present

## 2017-01-03 DIAGNOSIS — O26899 Other specified pregnancy related conditions, unspecified trimester: Secondary | ICD-10-CM

## 2017-01-03 DIAGNOSIS — Z825 Family history of asthma and other chronic lower respiratory diseases: Secondary | ICD-10-CM | POA: Diagnosis not present

## 2017-01-03 DIAGNOSIS — O99512 Diseases of the respiratory system complicating pregnancy, second trimester: Secondary | ICD-10-CM | POA: Insufficient documentation

## 2017-01-03 DIAGNOSIS — Z803 Family history of malignant neoplasm of breast: Secondary | ICD-10-CM | POA: Insufficient documentation

## 2017-01-03 DIAGNOSIS — O26891 Other specified pregnancy related conditions, first trimester: Secondary | ICD-10-CM | POA: Diagnosis not present

## 2017-01-03 DIAGNOSIS — O3680X0 Pregnancy with inconclusive fetal viability, not applicable or unspecified: Secondary | ICD-10-CM | POA: Diagnosis not present

## 2017-01-03 DIAGNOSIS — Z888 Allergy status to other drugs, medicaments and biological substances status: Secondary | ICD-10-CM | POA: Insufficient documentation

## 2017-01-03 DIAGNOSIS — N898 Other specified noninflammatory disorders of vagina: Secondary | ICD-10-CM | POA: Diagnosis not present

## 2017-01-03 DIAGNOSIS — R109 Unspecified abdominal pain: Secondary | ICD-10-CM | POA: Diagnosis not present

## 2017-01-03 DIAGNOSIS — Z8249 Family history of ischemic heart disease and other diseases of the circulatory system: Secondary | ICD-10-CM | POA: Diagnosis not present

## 2017-01-03 DIAGNOSIS — O99332 Smoking (tobacco) complicating pregnancy, second trimester: Secondary | ICD-10-CM | POA: Diagnosis not present

## 2017-01-03 DIAGNOSIS — O26892 Other specified pregnancy related conditions, second trimester: Secondary | ICD-10-CM | POA: Diagnosis not present

## 2017-01-03 DIAGNOSIS — F419 Anxiety disorder, unspecified: Secondary | ICD-10-CM | POA: Insufficient documentation

## 2017-01-03 LAB — URINALYSIS, ROUTINE W REFLEX MICROSCOPIC
Bilirubin Urine: NEGATIVE
Glucose, UA: NEGATIVE mg/dL
Hgb urine dipstick: NEGATIVE
KETONES UR: NEGATIVE mg/dL
Leukocytes, UA: NEGATIVE
NITRITE: NEGATIVE
PROTEIN: NEGATIVE mg/dL
Specific Gravity, Urine: 1.031 — ABNORMAL HIGH (ref 1.005–1.030)
pH: 5 (ref 5.0–8.0)

## 2017-01-03 LAB — CBC
HCT: 36.2 % (ref 36.0–46.0)
HEMOGLOBIN: 12.5 g/dL (ref 12.0–15.0)
MCH: 30.9 pg (ref 26.0–34.0)
MCHC: 34.5 g/dL (ref 30.0–36.0)
MCV: 89.6 fL (ref 78.0–100.0)
Platelets: 294 10*3/uL (ref 150–400)
RBC: 4.04 MIL/uL (ref 3.87–5.11)
RDW: 13.5 % (ref 11.5–15.5)
WBC: 8.3 10*3/uL (ref 4.0–10.5)

## 2017-01-03 LAB — WET PREP, GENITAL
SPERM: NONE SEEN
Trich, Wet Prep: NONE SEEN
YEAST WET PREP: NONE SEEN

## 2017-01-03 LAB — POCT PREGNANCY, URINE: Preg Test, Ur: POSITIVE — AB

## 2017-01-03 LAB — HCG, QUANTITATIVE, PREGNANCY: HCG, BETA CHAIN, QUANT, S: 125 m[IU]/mL — AB (ref <5)

## 2017-01-03 NOTE — MAU Provider Note (Signed)
History     CSN: [id number]  Arrival date and time: 01/03/17 7829   First Provider Initiated Contact with Patient 01/03/17 [phone]      No chief complaint on file.  [id number] @[redacted]w[redacted]d  by LMP here with lower abdominal cramping x3 days. She reports pain is intermittent. She has not used anything for the pain. She rates pain 4/10. She denies VB. She reports white ? malodorous vaginal discharge x1 week. She denies a new partner in 6 years but is not sure if he has been monogamous. 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 She reports 3 +HPT yesterday.     OB History    Gravida Para Term Preterm AB Living   5 2 2   1 2    SAB TAB Ectopic Multiple Live Births   1     0 2      Past Medical History:  Diagnosis Date  . Anxiety   . Asthma    uses inhaler, last used 8/22  . Depression   . History of chlamydia    12/2008; 03/09/2009  . History of gonorrhea    12/2008; 09/2010;   . 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 NO PAST SURGERIES      Family History  Problem Relation Age of Onset  . Hypertension Mother   . Breast cancer Paternal Grandmother   . Asthma Maternal Grandmother   . Other Neg Hx     Social History  Substance Use Topics  . Smoking status: Current Every Day Smoker    Packs/day: 0.25    Years: 0.50  . Smokeless tobacco: Never Used  . Alcohol use No     Comment: Rare    Allergies:  Allergies  Allergen Reactions  . Eggs Or Egg-Derived Products Hives and Swelling  . Latex Itching and Swelling    Prescriptions Prior to Admission  Medication Sig Dispense Refill Last Dose  . acetaminophen (TYLENOL) 500 MG tablet Take 1,000 mg by mouth every 6 (six) hours as needed for moderate pain.    Not Taking  . albuterol (PROVENTIL HFA;VENTOLIN HFA) 108 (90 Base) MCG/ACT inhaler Inhale 1-2 puffs into the lungs every 6 (six) hours as needed for wheezing or shortness of breath. (Patient not taking:  Reported on 11/06/2016) 1 Inhaler 0 Not Taking  . ibuprofen (ADVIL,MOTRIN) 800 MG tablet Take 800 mg by mouth every 8 (eight) hours as needed for moderate pain.   Not Taking  . PROVENTIL HFA 108 (90 Base) MCG/ACT inhaler Inhale 1-2 puffs into the lungs every 6 (six) hours as needed. 18 g 2 Taking  . valACYclovir (VALTREX) 500 MG tablet TAKE 1 TABLET BY MOUTH EVERY DAY TAKE TWICE A DAY FOR 3 DAYS FOR FLARE  11 Taking    Review of Systems  Constitutional: Negative for chills and fever.  Gastrointestinal: Positive for abdominal pain. Negative for constipation and diarrhea.  Genitourinary: Positive for frequency, urgency and vaginal discharge. Negative for dysuria and vaginal bleeding.   Physical Exam   Blood pressure 123/72, pulse 95, temperature 98.3 F (36.8 C), temperature source Oral, resp. rate 16, weight 79.4 kg (175 lb), last menstrual period 11/30/2016, not currently breastfeeding.  Physical Exam  Nursing note and vitals reviewed. Constitutional: She is oriented to person, place, and time. She appears well-developed and well-nourished. No distress.  HENT:  Head: Normocephalic and atraumatic.  Neck: Normal range of motion.  Cardiovascular:  Normal rate.   Respiratory: Effort normal.  GI: Soft. She exhibits no distension and no mass. There is no tenderness. There is no rebound and no guarding.  Genitourinary:  Genitourinary Comments: External: no lesions or erythema Vagina: rugated, parous, thin white discharge Uterus: non enlarged, anteverted, non tender, no CMT Adnexae: no masses, no tenderness left, + tenderness right   Musculoskeletal: Normal range of motion.  Neurological: She is alert and oriented to person, place, and time.  Skin: Skin is warm and dry.  Psychiatric: She has a normal mood and affect.   Results for orders placed or performed during the hospital encounter of 01/03/17 (from the past 24 hour(s))  Urinalysis, Routine w reflex microscopic     Status: Abnormal    Collection Time: 01/03/17  8:25 AM  Result Value Ref Range   Color, Urine YELLOW YELLOW   APPearance HAZY (A) CLEAR   Specific Gravity, Urine 1.031 (H) 1.005 - 1.030   pH 5.0 5.0 - 8.0   Glucose, UA NEGATIVE NEGATIVE mg/dL   Hgb urine dipstick NEGATIVE NEGATIVE   Bilirubin Urine NEGATIVE NEGATIVE   Ketones, ur NEGATIVE NEGATIVE mg/dL   Protein, ur NEGATIVE NEGATIVE mg/dL   Nitrite NEGATIVE NEGATIVE   Leukocytes, UA NEGATIVE NEGATIVE  Pregnancy, urine POC     Status: Abnormal   Collection Time: 01/03/17  9:15 AM  Result Value Ref Range   Preg Test, Ur POSITIVE (A) NEGATIVE  Wet prep, genital     Status: Abnormal   Collection Time: 01/03/17  9:29 AM  Result Value Ref Range   Yeast Wet Prep HPF POC NONE SEEN NONE SEEN   Trich, Wet Prep NONE SEEN NONE SEEN   Clue Cells Wet Prep HPF POC PRESENT (A) NONE SEEN   WBC, Wet Prep HPF POC FEW (A) NONE SEEN   Sperm NONE SEEN   hCG, quantitative, pregnancy     Status: Abnormal   Collection Time: 01/03/17  9:45 AM  Result Value Ref Range   hCG, Beta Chain, Quant, S 125 (H) <5 mIU/mL  CBC     Status: None   Collection Time: 01/03/17  9:45 AM  Result Value Ref Range   WBC 8.3 4.0 - 10.5 K/uL   RBC 4.04 3.87 - 5.11 MIL/uL   Hemoglobin 12.5 12.0 - 15.0 g/dL   HCT 81.1 91.4 - 78.2 %   MCV 89.6 78.0 - 100.0 fL   MCH 30.9 26.0 - 34.0 pg   MCHC 34.5 30.0 - 36.0 g/dL   RDW 95.6 21.3 - 08.6 %   Platelets 294 150 - 400 K/uL   US Ob Comp Less 14 Wks  Result Date: 01/03/2017 CLINICAL DATA:  Abdominal cramping during pregnancy. EXAM: OBSTETRIC <14 WK [place] AND TRANSVAGINAL OB US TECHNIQUE: Both transabdominal and transvaginal ultrasound examinations were performed for complete evaluation of the gestation as well as the maternal uterus, adnexal regions, and pelvic cul-de-sac. Transvaginal technique was performed to assess early pregnancy. COMPARISON:  04/25/2015 FINDINGS: Intrauterine gestational sac: None Yolk sac:  Not Visualized. Embryo:  Not  Visualized. Cardiac Activity: Not Visualized. Subchorionic hemorrhage:  None visualized. Maternal uterus/adnexae: Right ovary: Normal Left ovary: Normal Other :Normal Free fluid:  Trace IMPRESSION: 1. No intrauterine gestational sac, yolk sac, or fetal pole identified. Differential considerations include intrauterine pregnancy too early to be sonographically visualized, missed abortion, or ectopic pregnancy. Followup ultrasound is recommended in 10-14 days for further evaluation. Electronically Signed   By: Signa Kell M.D.   On: 01/03/2017 10:59  [place] Ob Transvaginal  Result Date: 01/03/2017 CLINICAL DATA:  Abdominal cramping during pregnancy. EXAM: OBSTETRIC <14 WK US AND TRANSVAGINAL OB US TECHNIQUE: Both transabdominal and transvaginal ultrasound examinations were performed for complete evaluation of the gestation as well as the maternal uterus, adnexal regions, and pelvic cul-de-sac. Transvaginal technique was performed to assess early pregnancy. COMPARISON:  04/25/2015 FINDINGS: Intrauterine gestational sac: None Yolk sac:  Not Visualized. Embryo:  Not Visualized. Cardiac Activity: Not Visualized. Subchorionic hemorrhage:  None visualized. Maternal uterus/adnexae: Right ovary: Normal Left ovary: Normal Other :Normal Free fluid:  Trace IMPRESSION: 1. No intrauterine gestational sac, yolk sac, or fetal pole identified. Differential considerations include intrauterine pregnancy too early to be sonographically visualized, missed abortion, or ectopic pregnancy. Followup ultrasound is recommended in 10-14 days for further evaluation. Electronically Signed   By: Signa Kellaylor  [place] M.D.   On: 01/03/2017 10:59   MAU Course  Procedures  MDM Labs and US ordered and reviewed. Quant low, no IUP seen on US. Cannot r/o early pregnancy, pregnancy failure, or ectopic. Will rpt quant in 2 days. Stable for discharge home.  Assessment and Plan   1. Pregnancy of unknown anatomic location   2. Cramping affecting  pregnancy, antepartum    Discharge home Follow up in 2 days in MAU for labs Ectopic/SAB precautions  Allergies as of 01/03/2017      Reactions   Eggs Or Egg-derived Products Hives, Swelling   Latex Itching, Swelling      Medication List    TAKE these medications   acetaminophen 500 MG tablet Commonly known as:  TYLENOL Take 1,000 mg by mouth every 6 (six) hours as needed for moderate pain.   PROVENTIL HFA 108 (90 Base) MCG/ACT inhaler Generic drug:  albuterol Inhale 1-2 puffs into the lungs every 6 (six) hours as needed. What changed:  reasons to take this   albuterol 108 (90 Base) MCG/ACT inhaler Commonly known as:  PROVENTIL HFA;VENTOLIN HFA Inhale 1-2 puffs into the lungs every 6 (six) hours as needed for wheezing or shortness of breath. What changed:  Another medication with the same name was changed. Make sure you understand how and when to take each.   valACYclovir 500 MG tablet Commonly known as:  VALTREX TAKE 1 TABLET BY MOUTH EVERY DAY TAKE TWICE A DAY FOR 3 DAYS FOR FLARE      Donette LarryMelanie Geffrey Michaelsen, CNM 01/03/2017, 9:19 AM

## 2017-01-03 NOTE — MAU Note (Signed)
3+preg test. Has been having this cramping feeling like period was going to come on, but it's not.

## 2017-01-03 NOTE — Discharge Instructions (Signed)

## 2017-01-06 LAB — GC/CHLAMYDIA PROBE AMP (~~LOC~~) NOT AT ARMC
CHLAMYDIA, DNA PROBE: NEGATIVE
Neisseria Gonorrhea: NEGATIVE

## 2017-01-21 ENCOUNTER — Encounter (HOSPITAL_COMMUNITY): Payer: Self-pay

## 2017-01-21 ENCOUNTER — Inpatient Hospital Stay (HOSPITAL_COMMUNITY): Payer: Medicaid Other

## 2017-01-21 ENCOUNTER — Inpatient Hospital Stay (HOSPITAL_COMMUNITY)
Admission: AD | Admit: 2017-01-21 | Discharge: 2017-01-22 | Disposition: A | Discharge status: Home / Self Care | Payer: Medicaid Other | Source: Ambulatory Visit | Attending: Family Medicine | Admitting: Family Medicine

## 2017-01-21 DIAGNOSIS — R001 Bradycardia, unspecified: Secondary | ICD-10-CM | POA: Diagnosis not present

## 2017-01-21 DIAGNOSIS — O26899 Other specified pregnancy related conditions, unspecified trimester: Secondary | ICD-10-CM

## 2017-01-21 DIAGNOSIS — Z79899 Other long term (current) drug therapy: Secondary | ICD-10-CM | POA: Diagnosis not present

## 2017-01-21 DIAGNOSIS — Z3A01 Less than 8 weeks gestation of pregnancy: Secondary | ICD-10-CM | POA: Diagnosis not present

## 2017-01-21 DIAGNOSIS — O209 Hemorrhage in early pregnancy, unspecified: Secondary | ICD-10-CM | POA: Diagnosis not present

## 2017-01-21 DIAGNOSIS — O26891 Other specified pregnancy related conditions, first trimester: Secondary | ICD-10-CM

## 2017-01-21 DIAGNOSIS — R109 Unspecified abdominal pain: Secondary | ICD-10-CM | POA: Diagnosis not present

## 2017-01-21 LAB — HCG, QUANTITATIVE, PREGNANCY: hCG, Beta Chain, Quant, S: 4363 m[IU]/mL — ABNORMAL HIGH (ref <5)

## 2017-01-21 NOTE — MAU Note (Signed)
IN B-ROOM   COLLECTING UA

## 2017-01-21 NOTE — MAU Note (Signed)
PT  SAYS SHE WAS HERE  2 WEEKS  AGO-    FOR  CRAMPING  - NO   BLEEDING.     STILL FEELS  CRAMPING   AND   NOW IN UPPER  ABD  - NO BLEEDING   LAST SEX-   TODAY

## 2017-01-21 NOTE — MAU Provider Note (Signed)
History    First Provider Initiated Contact with Patient 01/21/17 2338      Chief Complaint:  Abdominal Cramping   Lauren Conway is  26 y.o. [id number] Patient's last menstrual period was 12/04/2016.Marland Kitchen. Patient is here for Continued abdominal pain that has now spread more to the upper abdomen and ongoing surveillance of pregnancy status.   She is 10815w6d weeks gestation  by LMP.    Since her last visit, the patient is with new complaint.     ROS Abdomin Pain: Same intensity but has spread to the upper abdomen Vaginal bleeding: none now.   Passage of clots or tissue: None Dizziness: None  Her previous Quantitative HCG values are:  Results for Lauren Conway, Lauren Conway (MRN [id number]) as of 01/21/2017 23:10  Ref. Range 01/03/2017 09:45  HCG, Beta Chain, Quant, S Latest Ref Range: <5 mIU/mL 125 (H)    Physical Exam   BP 112/66 (BP Location: Right Arm)   Pulse 92   Temp 98.9 F (37.2 C) (Oral)   Resp 20   LMP 12/04/2016  Constitutional: Well-nourished female in no apparent distress. No pallor Neuro: Alert and oriented 4 Cardiovascular: Normal rate Respiratory: Normal effort and rate Abdomen: Soft, nontender Gynecological Exam: exam declined by the patient  Labs: Results for orders placed or performed during the hospital encounter of 01/21/17 (from the past 24 hour(s))  hCG, quantitative, pregnancy   Collection Time: 01/21/17 10:09 PM  Result Value Ref Range   hCG, Beta Chain, Quant, S 4,363 (H) <5 mIU/mL    Ultrasound Studies:   [place] Ob Comp Less 14 Wks  Result Date: 01/03/2017 CLINICAL DATA:  Abdominal cramping during pregnancy. EXAM: OBSTETRIC <14 WK US AND TRANSVAGINAL OB US TECHNIQUE: Both transabdominal and transvaginal ultrasound examinations were performed for complete evaluation of the gestation as well as the maternal uterus, adnexal regions, and pelvic cul-de-sac. Transvaginal technique was performed to assess early pregnancy. COMPARISON:  04/25/2015 FINDINGS: Intrauterine  gestational sac: None Yolk sac:  Not Visualized. Embryo:  Not Visualized. Cardiac Activity: Not Visualized. Subchorionic hemorrhage:  None visualized. Maternal uterus/adnexae: Right ovary: Normal Left ovary: Normal Other :Normal Free fluid:  Trace IMPRESSION: 1. No intrauterine gestational sac, yolk sac, or fetal pole identified. Differential considerations include intrauterine pregnancy too early to be sonographically visualized, missed abortion, or ectopic pregnancy. Followup ultrasound is recommended in 10-14 days for further evaluation. Electronically Signed   By: Signa Kellaylor  Stroud M.D.   On: 01/03/2017 10:59   [place] Ob Transvaginal  Result Date: 01/21/2017 CLINICAL DATA:  Cramping. Follow-up from 01/03/2017. Pregnancy of unknown location. Estimated gestational age by LMP is 6 weeks 6 days. No current quantitative beta HCG obtained. EXAM: TRANSVAGINAL OB ULTRASOUND TECHNIQUE: Transvaginal ultrasound was performed for complete evaluation of the gestation as well as the maternal uterus, adnexal regions, and pelvic cul-de-sac. COMPARISON:  01/03/2017 FINDINGS: Intrauterine gestational sac: A single intrauterine gestational sac is identified. Yolk sac:  Yolk sac is present. Embryo:  Fetal pole is identified. Cardiac Activity: Fetal cardiac activity is observed. Heart Rate: Ranging from 55-86 bpm CRL:   3.9  mm   6 w 0 d                  US EDC: 09/16/2017 Subchorionic hemorrhage:  None visualized. Maternal uterus/adnexae: Uterus is mildly anteverted. Heterogeneous myometrium with suggestion of small fibroid measuring about 1.2 cm diameter. Both ovaries are visualized. Corpus luteal cyst on the right. No abnormal adnexal masses. Small amount of free fluid in the pelvis. IMPRESSION:  Single intrauterine pregnancy. Estimated gestational age based on crown-rump length is 6 weeks 0 days. Fetal cardiac activity is observed with fetal heart rate measuring 55-86 beats per minute. Electronically Signed   By: Burman Nieves  M.D.   On: 01/21/2017 23:07   US Ob Transvaginal  Result Date: 01/03/2017 CLINICAL DATA:  Abdominal cramping during pregnancy. EXAM: OBSTETRIC <14 WK [place] AND TRANSVAGINAL OB US TECHNIQUE: Both transabdominal and transvaginal ultrasound examinations were performed for complete evaluation of the gestation as well as the maternal uterus, adnexal regions, and pelvic cul-de-sac. Transvaginal technique was performed to assess early pregnancy. COMPARISON:  04/25/2015 FINDINGS: Intrauterine gestational sac: None Yolk sac:  Not Visualized. Embryo:  Not Visualized. Cardiac Activity: Not Visualized. Subchorionic hemorrhage:  None visualized. Maternal uterus/adnexae: Right ovary: Normal Left ovary: Normal Other :Normal Free fluid:  Trace IMPRESSION: 1. No intrauterine gestational sac, yolk sac, or fetal pole identified. Differential considerations include intrauterine pregnancy too early to be sonographically visualized, missed abortion, or ectopic pregnancy. Followup ultrasound is recommended in 10-14 days for further evaluation. Electronically Signed   By: Signa Kell M.D.   On: 01/03/2017 10:59    MAU course/MDM: Ultrasound, Quantitative hCG ordered  Abdominal pain in early pregnancy with normal rise in Quant and live IUP seen on ultrasound. No clear etiology for abdominal pain, but patient does not have an acute abdomen or evidence of other emergent condition.  Assessment: 6.6 week live IUP. Bradycardia noted.  Plan: Discharge home in stable condition. First trimester precautions List of providers given. Pregnancy verification letter given. Over-the-counter prenatal vitamin. Follow-up Information    Obstetrician of your choice Follow up.        [facility] [place] MATERNITY ADMISSIONS Follow up.   Why:  as needed in emergencies Contact information: [address] [id number] mc [place] [address] [phone]         Allergies as of 01/21/2017       Reactions   Eggs Or Egg-derived Products Hives, Swelling   Latex Itching, Swelling      Medication List    TAKE these medications   acetaminophen 500 MG tablet Commonly known as:  TYLENOL Take 1,000 mg by mouth every 6 (six) hours as needed for moderate pain.   PROVENTIL HFA 108 (90 Base) MCG/ACT inhaler Generic drug:  albuterol Inhale 1-2 puffs into the lungs every 6 (six) hours as needed. What changed:  reasons to take this   albuterol 108 (90 Base) MCG/ACT inhaler Commonly known as:  PROVENTIL HFA;VENTOLIN HFA Inhale 1-2 puffs into the lungs every 6 (six) hours as needed for wheezing or shortness of breath. What changed:  Another medication with the same name was changed. Make sure you understand how and when to take each.   valACYclovir 500 MG tablet Commonly known as:  VALTREX TAKE 1 TABLET BY MOUTH EVERY DAY TAKE TWICE A DAY FOR 3 DAYS FOR FLARE       Dorathy Kinsman, CNM 01/21/2017, 11:52 PM  2/3

## 2017-01-21 NOTE — Discharge Instructions (Signed)
Abdominal Pain During Pregnancy  Abdominal pain is common in pregnancy. Most of the time, it does not cause harm. There are many causes of abdominal pain. Some causes are more serious than others and sometimes the cause is not known. Abdominal pain can be a sign that something is very wrong with the pregnancy or the pain may have nothing to do with the pregnancy. Always tell your health care provider if you have any abdominal pain.  Follow these instructions at home:  · Do not have sex or put anything in your vagina until your symptoms go away completely.  · Watch your abdominal pain for any changes.  · Get plenty of rest until your pain improves.  · Drink enough fluid to keep your urine clear or pale yellow.  · Take over-the-counter or prescription medicines only as told by your health care provider.  · Keep all follow-up visits as told by your health care provider. This is important.  Contact a health care provider if:  · You have a fever.  · Your pain gets worse or you have cramping.  · Your pain continues after resting.  Get help right away if:  · You are bleeding, leaking fluid, or passing tissue from the vagina.  · You have vomiting or diarrhea that does not go away.  · You have painful or bloody urination.  · You notice a decrease in your baby's movements.  · You feel very weak or faint.  · You have shortness of breath.  · You develop a severe headache with abdominal pain.  · You have abnormal vaginal discharge with abdominal pain.  This information is not intended to replace advice given to you by your health care provider. Make sure you discuss any questions you have with your health care provider.  Document Released: 12/02/2005 Document Revised: 09/12/2016 Document Reviewed: 07/01/2013  Elsevier Interactive Patient Education © 2017 [employer]vier Inc.

## 2017-02-04 ENCOUNTER — Inpatient Hospital Stay (HOSPITAL_COMMUNITY): Payer: Medicaid Other

## 2017-02-04 ENCOUNTER — Encounter (HOSPITAL_COMMUNITY): Payer: Self-pay

## 2017-02-04 ENCOUNTER — Inpatient Hospital Stay (HOSPITAL_COMMUNITY)
Admission: AD | Admit: 2017-02-04 | Discharge: 2017-02-04 | Disposition: A | Discharge status: Home / Self Care | Payer: Medicaid Other | Source: Ambulatory Visit | Attending: Family Medicine | Admitting: Family Medicine

## 2017-02-04 DIAGNOSIS — Z3A09 9 weeks gestation of pregnancy: Secondary | ICD-10-CM | POA: Diagnosis not present

## 2017-02-04 DIAGNOSIS — R109 Unspecified abdominal pain: Secondary | ICD-10-CM | POA: Diagnosis present

## 2017-02-04 DIAGNOSIS — O039 Complete or unspecified spontaneous abortion without complication: Secondary | ICD-10-CM | POA: Diagnosis not present

## 2017-02-04 DIAGNOSIS — F17210 Nicotine dependence, cigarettes, uncomplicated: Secondary | ICD-10-CM | POA: Insufficient documentation

## 2017-02-04 DIAGNOSIS — O99331 Smoking (tobacco) complicating pregnancy, first trimester: Secondary | ICD-10-CM | POA: Diagnosis not present

## 2017-02-04 DIAGNOSIS — O209 Hemorrhage in early pregnancy, unspecified: Secondary | ICD-10-CM

## 2017-02-04 DIAGNOSIS — O26899 Other specified pregnancy related conditions, unspecified trimester: Secondary | ICD-10-CM

## 2017-02-04 LAB — URINALYSIS, ROUTINE W REFLEX MICROSCOPIC
BILIRUBIN URINE: NEGATIVE
GLUCOSE, UA: NEGATIVE mg/dL
HGB URINE DIPSTICK: NEGATIVE
Ketones, ur: NEGATIVE mg/dL
Leukocytes, UA: NEGATIVE
Nitrite: NEGATIVE
PROTEIN: NEGATIVE mg/dL
Specific Gravity, Urine: 1.024 (ref 1.005–1.030)
pH: 6 (ref 5.0–8.0)

## 2017-02-04 LAB — CBC
HCT: 33.4 % — ABNORMAL LOW (ref 36.0–46.0)
Hemoglobin: 11.6 g/dL — ABNORMAL LOW (ref 12.0–15.0)
MCH: 31.1 pg (ref 26.0–34.0)
MCHC: 34.7 g/dL (ref 30.0–36.0)
MCV: 89.5 fL (ref 78.0–100.0)
PLATELETS: 312 10*3/uL (ref 150–400)
RBC: 3.73 MIL/uL — AB (ref 3.87–5.11)
RDW: 13.2 % (ref 11.5–15.5)
WBC: 6.9 10*3/uL (ref 4.0–10.5)

## 2017-02-04 MED ORDER — ONDANSETRON 8 MG PO TBDP
8.0000 mg | ORAL_TABLET | Freq: Once | ORAL | Status: AC
  Administered 2017-02-04: 8 mg via ORAL
  Filled 2017-02-04: qty 1

## 2017-02-04 MED ORDER — OXYCODONE-ACETAMINOPHEN 5-325 MG PO TABS: 1.0000 | ORAL_TABLET | Freq: Four times a day (QID) | ORAL | 0 refills | Status: DC | PRN

## 2017-02-04 MED ORDER — MISOPROSTOL 200 MCG PO TABS
800.0000 ug | ORAL_TABLET | Freq: Once | ORAL | Status: AC
  Administered 2017-02-04: 800 ug via VAGINAL
  Filled 2017-02-04: qty 4

## 2017-02-04 MED ORDER — PROMETHAZINE HCL 25 MG PO TABS: 25.0000 mg | ORAL_TABLET | Freq: Four times a day (QID) | ORAL | 0 refills | Status: DC | PRN
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
## 2017-02-04 NOTE — Discharge Instructions (Signed)
Miscarriage A miscarriage is the sudden loss of an unborn baby (fetus) before the 20th week of pregnancy. Most miscarriages happen in the first 3 months of pregnancy. Sometimes, it happens before a woman even knows she is pregnant. A miscarriage is also called a "spontaneous miscarriage" or "early pregnancy loss." Having a miscarriage can be an emotional experience. Talk with your caregiver about any questions you may have about miscarrying, the grieving process, and your future pregnancy plans. What are the causes?  Problems with the fetal chromosomes that make it impossible for the baby to develop normally. Problems with the baby's genes or chromosomes are most often the result of errors that occur, by chance, as the embryo divides and grows. The problems are not inherited from the parents.  Infection of the cervix or uterus.  Hormone problems.  Problems with the cervix, such as having an incompetent cervix. This is when the tissue in the cervix is not strong enough to hold the pregnancy.  Problems with the uterus, such as an abnormally shaped uterus, uterine fibroids, or congenital abnormalities.  Certain medical conditions.  Smoking, drinking alcohol, or taking illegal drugs.  Trauma. Often, the cause of a miscarriage is unknown. What are the signs or symptoms?  Vaginal bleeding or spotting, with or without cramps or pain.  Pain or cramping in the abdomen or lower back.  Passing fluid, tissue, or blood clots from the vagina. How is this diagnosed? Your caregiver will perform a physical exam. You may also have an ultrasound to confirm the miscarriage. Blood or urine tests may also be ordered. How is this treated?  Sometimes, treatment is not necessary if you naturally pass all the fetal tissue that was in the uterus. If some of the fetus or placenta remains in the body (incomplete miscarriage), tissue left behind may become infected and must be removed. Usually, a dilation and  curettage (D and C) procedure is performed. During a D and C procedure, the cervix is widened (dilated) and any remaining fetal or placental tissue is gently removed from the uterus.  Antibiotic medicines are prescribed if there is an infection. Other medicines may be given to reduce the size of the uterus (contract) if there is a lot of bleeding.  If you have Rh negative blood and your baby was Rh positive, you will need a Rh immunoglobulin shot. This shot will protect any future baby from having Rh blood problems in future pregnancies. Follow these instructions at home:  Your caregiver may order bed rest or may allow you to continue light activity. Resume activity as directed by your caregiver.  Have someone help with home and family responsibilities during this time.  Keep track of the number of sanitary pads you use each day and how soaked (saturated) they are. Write down this information.  Do not use tampons. Do not douche or have sexual intercourse until approved by your caregiver.  Only take over-the-counter or prescription medicines for pain or discomfort as directed by your caregiver.  Do not take aspirin. Aspirin can cause bleeding.  Keep all follow-up appointments with your caregiver.  If you or your partner have problems with grieving, talk to your caregiver or seek counseling to help cope with the pregnancy loss. Allow enough time to grieve before trying to get pregnant again. Get help right away if:  You have severe cramps or pain in your back or abdomen.  You have a fever.  You pass large blood clots (walnut-sized or larger) ortissue from your  vagina. Save any tissue for your caregiver to inspect.  Your bleeding increases.  You have a thick, bad-smelling vaginal discharge.  You become lightheaded, weak, or you faint.  You have chills. This information is not intended to replace advice given to you by your health care provider. Make sure you discuss any questions  you have with your health care provider. Document Released: 05/28/2001 Document Revised: 05/09/2016 Document Reviewed: 01/21/2012 Elsevier Interactive Patient Education  2017 Elsevier Inc.     Pelvic Rest Introduction Pelvic rest may be recommended if:  Your placenta is partially or completely covering the opening of your cervix (placenta previa).  There is bleeding between the wall of the uterus and the amniotic sac in the first trimester of pregnancy (subchorionic hemorrhage).  You went into labor too early (preterm labor). Based on your overall health and the health of your baby, your health care provider will decide if pelvic rest is right for you. How do I rest my pelvis? For as long as told by your health care provider:  Do not have sex, sexual stimulation, or an orgasm.  Do not use tampons. Do not douche. Do not put anything in your vagina.  Do not lift anything that is heavier than 10 lb (4.5 kg).  Avoid activities that take a lot of effort (are strenuous).  Avoid any activity in which your pelvic muscles could become strained. When should I seek medical care? Seek medical care if you have:  Cramping pain in your lower abdomen.  Vaginal discharge.  A low, dull backache.  Regular contractions.  Uterine tightening. When should I seek immediate medical care? Seek immediate medical care if:  You have vaginal bleeding and you are pregnant. This information is not intended to replace advice given to you by your health care provider. Make sure you discuss any questions you have with your health care provider. Document Released: 03/29/2011 Document Revised: 05/09/2016 Document Reviewed: 06/05/2015  2017 Elsevier
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
## 2017-02-05 LAB — WET PREP, GENITAL
SPERM: NONE SEEN
Trich, Wet Prep: NONE SEEN
Yeast Wet Prep HPF POC: NONE SEEN

## 2017-02-05 LAB — GC/CHLAMYDIA PROBE AMP (~~LOC~~) NOT AT ARMC
Chlamydia: NEGATIVE
NEISSERIA GONORRHEA: NEGATIVE

## 2017-02-19 ENCOUNTER — Ambulatory Visit: Payer: Medicaid Other | Admitting: Obstetrics

## 2017-02-21 ENCOUNTER — Encounter: Payer: Self-pay | Admitting: Obstetrics

## 2017-02-21 ENCOUNTER — Ambulatory Visit (INDEPENDENT_AMBULATORY_CARE_PROVIDER_SITE_OTHER): Payer: Medicaid Other | Admitting: Obstetrics

## 2017-02-21 VITALS — BP 114/75 | HR 87 | Ht 63.0 in | Wt 180.0 lb

## 2017-02-21 DIAGNOSIS — Z30011 Encounter for initial prescription of contraceptive pills: Secondary | ICD-10-CM

## 2017-02-21 DIAGNOSIS — O039 Complete or unspecified spontaneous abortion without complication: Secondary | ICD-10-CM

## 2017-02-21 DIAGNOSIS — A6004 Herpesviral vulvovaginitis: Secondary | ICD-10-CM | POA: Diagnosis not present

## 2017-02-21 DIAGNOSIS — N946 Dysmenorrhea, unspecified: Secondary | ICD-10-CM | POA: Diagnosis not present

## 2017-02-21 MED ORDER — NORGESTIMATE-ETH ESTRADIOL 0.25-35 MG-MCG PO TABS: 1.0000 | ORAL_TABLET | Freq: Every day | ORAL | 11 refills | Status: DC

## 2017-02-21 MED ORDER — ALBUTEROL SULFATE HFA 108 (90 BASE) MCG/ACT IN AERS: 1.0000 | INHALATION_SPRAY | Freq: Four times a day (QID) | RESPIRATORY_TRACT | 99 refills | Status: DC | PRN

## 2017-02-21 MED ORDER — VALACYCLOVIR HCL 1 G PO TABS: ORAL_TABLET | ORAL | 99 refills | Status: DC

## 2017-02-21 MED ORDER — IBUPROFEN 800 MG PO TABS: 800.0000 mg | ORAL_TABLET | Freq: Three times a day (TID) | ORAL | 5 refills | Status: DC | PRN

## 2017-02-21 NOTE — Progress Notes (Signed)
Pt requests rx for Oxycodone. It is the only medicine that will subside pain. Pt also request Valtrex and Albuterol rf.

## 2017-02-23 ENCOUNTER — Encounter: Payer: Self-pay | Admitting: Obstetrics

## 2017-02-23 NOTE — Progress Notes (Signed)
Patient ID: Lauren Conway, female   DOB: 06/21/1991, 26 y.o.   MRN: [id number]  Chief Complaint  Patient presents with  . Miscarriage    HPI Lauren Conway is a 26 y.o. female.  Presents for follow up for IUFD follow and Cytotec induced termination of pregnancy. HPI  Past Medical History:  Diagnosis Date  . Anxiety   . Asthma    uses inhaler, last used 8/22  . Depression   . History of chlamydia    12/2008; 03/09/2009  . History of gonorrhea    12/2008; 09/2010;   . 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 NO PAST SURGERIES      Family History  Problem Relation Age of Onset  . Hypertension Mother   . Breast cancer Paternal Grandmother   . Asthma Maternal Grandmother   . Other Neg Hx     Social History Social History  Substance Use Topics  . Smoking status: Current Every Day Smoker    Packs/day: 0.25    Years: 0.50  . Smokeless tobacco: Never Used  . Alcohol use No     Comment: Rare    Allergies  Allergen Reactions  . Eggs Or Egg-Derived Products Hives and Swelling  . Latex Itching and Swelling    Current Outpatient Prescriptions  Medication Sig Dispense Refill  . albuterol (PROVENTIL HFA;VENTOLIN HFA) 108 (90 Base) MCG/ACT inhaler Inhale 1-2 puffs into the lungs every 6 (six) hours as needed for wheezing or shortness of breath. 1 Inhaler prn  . oxyCODONE-acetaminophen (PERCOCET/ROXICET) 5-325 MG tablet Take 1-2 tablets by mouth every 6 (six) hours as needed. 20 tablet 0  . Prenatal Vit-Fe Fumarate-FA (PRENATAL MULTIVITAMIN) TABS tablet Take 1 tablet by mouth daily at 12 noon.    Marland Kitchen. acetaminophen (TYLENOL) 500 MG tablet Take 1,000 mg by mouth every 6 (six) hours as needed for moderate pain.     Marland Kitchen. ibuprofen (ADVIL,MOTRIN) 800 MG tablet Take 1 tablet (800 mg total) by mouth every 8 (eight) hours as needed. 30 tablet 5  . norgestimate-ethinyl estradiol (ORTHO-CYCLEN,SPRINTEC,PREVIFEM)  0.25-35 MG-MCG tablet Take 1 tablet by mouth daily. 1 Package 11  . promethazine (PHENERGAN) 12.5 MG tablet Take 1 tablet (12.5 mg total) by mouth every 6 (six) hours as needed for nausea. 30 tablet 0  . promethazine (PHENERGAN) 25 MG tablet Take 1 tablet (25 mg total) by mouth every 6 (six) hours as needed for nausea or vomiting. (Patient not taking: Reported on 02/21/2017) 20 tablet 0  . valACYclovir (VALTREX) 1000 MG tablet Take 1 tab po bid x 3 days prn each outbreak 30 tablet prn   No current facility-administered medications for this visit.     Review of Systems Review of Systems Constitutional: negative for fatigue and weight loss Respiratory: negative for cough and wheezing Cardiovascular: negative for chest pain, fatigue and palpitations Gastrointestinal: negative for abdominal pain and change in bowel habits Genitourinary:negative Integument/breast: negative for nipple discharge Musculoskeletal:negative for myalgias Neurological: negative for gait problems and tremors Behavioral/Psych: negative for abusive relationship, depression Endocrine: negative for temperature intolerance      Blood pressure 114/75, pulse 87, height 5\' 3"  (1.6 m), weight 180 lb (81.6 kg), last menstrual period 12/04/2016, unknown if currently breastfeeding.  Physical Exam Physical Exam General:   alert  Skin:   no rash or abnormalities  Lungs:   clear to auscultation bilaterally  Heart:   regular rate and rhythm, S1,  S2 normal, no murmur, click, rub or gallop  Breasts:   normal without suspicious masses, skin or nipple changes or axillary nodes  Abdomen:  normal findings: no organomegaly, soft, non-tender and no hernia  Pelvis:  External genitalia: normal general appearance Urinary system: urethral meatus normal and bladder without fullness, nontender Vaginal: normal without tenderness, induration or masses Cervix: normal appearance Adnexa: normal bimanual exam Uterus: anteverted and non-tender,  normal size    50% of 15 min visit spent on counseling and coordination of care.    Data Reviewed Labs  Assessment     IUFD S/P Cytotec induced abortion, complete Contraceptive Counseling and Advice.  Wants OCP's. History of Genital Herpes.  On Valtrex prn History of Asthma, stable.  Needs inhaler.    Plan    Sprintec 28 Rx  Valtrex Rx Ventolin inhaler Rx   No orders of the defined types were placed in this encounter.  Meds ordered this encounter  Medications  . albuterol (PROVENTIL HFA;VENTOLIN HFA) 108 (90 Base) MCG/ACT inhaler    Sig: Inhale 1-2 puffs into the lungs every 6 (six) hours as needed for wheezing or shortness of breath.    Dispense:  1 Inhaler    Refill:  prn  . valACYclovir (VALTREX) 1000 MG tablet    Sig: Take 1 tab po bid x 3 days prn each outbreak    Dispense:  30 tablet    Refill:  prn  . ibuprofen (ADVIL,MOTRIN) 800 MG tablet    Sig: Take 1 tablet (800 mg total) by mouth every 8 (eight) hours as needed.    Dispense:  30 tablet    Refill:  5  . norgestimate-ethinyl estradiol (ORTHO-CYCLEN,SPRINTEC,PREVIFEM) 0.25-35 MG-MCG tablet    Sig: Take 1 tablet by mouth daily.    Dispense:  1 Package    Refill:  11

## 2017-03-13 ENCOUNTER — Ambulatory Visit (INDEPENDENT_AMBULATORY_CARE_PROVIDER_SITE_OTHER): Payer: Medicaid Other

## 2017-03-13 VITALS — BP 109/73 | HR 80 | Wt 184.0 lb

## 2017-03-13 DIAGNOSIS — Z3200 Encounter for pregnancy test, result unknown: Secondary | ICD-10-CM

## 2017-03-13 DIAGNOSIS — Z3201 Encounter for pregnancy test, result positive: Secondary | ICD-10-CM

## 2017-03-13 LAB — POCT URINE PREGNANCY: Preg Test, Ur: POSITIVE — AB
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
## 2017-03-14 LAB — BETA HCG QUANT (REF LAB): hCG Quant: 2479 m[IU]/mL

## 2017-03-17 ENCOUNTER — Other Ambulatory Visit: Payer: Self-pay | Admitting: Certified Nurse Midwife

## 2017-03-17 ENCOUNTER — Telehealth: Payer: Self-pay | Admitting: *Deleted

## 2017-03-17 ENCOUNTER — Other Ambulatory Visit: Payer: Self-pay | Admitting: *Deleted

## 2017-03-17 DIAGNOSIS — Z8759 Personal history of other complications of pregnancy, childbirth and the puerperium: Secondary | ICD-10-CM

## 2017-03-17 DIAGNOSIS — Z3A01 Less than 8 weeks gestation of pregnancy: Secondary | ICD-10-CM

## 2017-03-17 NOTE — Telephone Encounter (Signed)
Patient informed of results and that we will order [place] for viability and placement of pregnancy
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
## 2017-03-17 NOTE — Telephone Encounter (Signed)
-----   Message from Olevia Bowens sent at 03/17/2017 10:19 AM EDT ----- Regarding: Lab Results Contact: [phone] Pt called wants lab results from Beta HCG she states she had on Thursday 04/13/2017.

## 2017-03-21 ENCOUNTER — Ambulatory Visit (HOSPITAL_COMMUNITY)
Admission: RE | Admit: 2017-03-21 | Discharge: 2017-03-21 | Disposition: A | Discharge status: Home / Self Care | Payer: Medicaid Other | Source: Ambulatory Visit | Attending: Certified Nurse Midwife | Admitting: Certified Nurse Midwife

## 2017-03-21 DIAGNOSIS — Z3A01 Less than 8 weeks gestation of pregnancy: Secondary | ICD-10-CM

## 2017-03-21 DIAGNOSIS — Z8759 Personal history of other complications of pregnancy, childbirth and the puerperium: Secondary | ICD-10-CM | POA: Diagnosis present

## 2017-03-25 ENCOUNTER — Other Ambulatory Visit (HOSPITAL_COMMUNITY)
Admission: RE | Admit: 2017-03-25 | Discharge: 2017-03-25 | Disposition: A | Discharge status: Home / Self Care | Payer: Medicaid Other | Source: Ambulatory Visit | Attending: Obstetrics | Admitting: Obstetrics

## 2017-03-25 ENCOUNTER — Ambulatory Visit (INDEPENDENT_AMBULATORY_CARE_PROVIDER_SITE_OTHER): Payer: Medicaid Other | Admitting: Obstetrics

## 2017-03-25 VITALS — BP 113/72 | HR 84 | Temp 98.1°F | Wt 184.3 lb

## 2017-03-25 DIAGNOSIS — Z113 Encounter for screening for infections with a predominantly sexual mode of transmission: Secondary | ICD-10-CM | POA: Diagnosis present

## 2017-03-25 DIAGNOSIS — N898 Other specified noninflammatory disorders of vagina: Secondary | ICD-10-CM

## 2017-03-25 DIAGNOSIS — B9689 Other specified bacterial agents as the cause of diseases classified elsewhere: Secondary | ICD-10-CM

## 2017-03-25 DIAGNOSIS — N760 Acute vaginitis: Secondary | ICD-10-CM

## 2017-03-25 MED ORDER — METRONIDAZOLE 0.75 % VA GEL: 1.0000 | Freq: Two times a day (BID) | VAGINAL | 2 refills | Status: DC

## 2017-03-25 NOTE — Progress Notes (Signed)
Patient is in the office for [place] results, reports feeling mild pressure.  Ultrasound on 03/21/2017:  6 weeks 2 days.  FHR = 94bpm.  EDC 11-12-2017  Charles A. Clearance Coots MD

## 2017-03-26 LAB — CERVICOVAGINAL ANCILLARY ONLY
Chlamydia: NEGATIVE
Neisseria Gonorrhea: NEGATIVE
Trichomonas: NEGATIVE

## 2017-04-09 ENCOUNTER — Encounter (HOSPITAL_COMMUNITY): Payer: Self-pay

## 2017-04-09 ENCOUNTER — Inpatient Hospital Stay (HOSPITAL_COMMUNITY)
Admission: AD | Admit: 2017-04-09 | Discharge: 2017-04-10 | Disposition: A | Discharge status: Home / Self Care | Payer: Medicaid Other | Source: Ambulatory Visit | Attending: Obstetrics & Gynecology | Admitting: Obstetrics & Gynecology

## 2017-04-09 DIAGNOSIS — Z79899 Other long term (current) drug therapy: Secondary | ICD-10-CM | POA: Diagnosis not present

## 2017-04-09 DIAGNOSIS — O219 Vomiting of pregnancy, unspecified: Secondary | ICD-10-CM

## 2017-04-09 DIAGNOSIS — R110 Nausea: Secondary | ICD-10-CM | POA: Diagnosis present

## 2017-04-09 DIAGNOSIS — R102 Pelvic and perineal pain: Secondary | ICD-10-CM | POA: Diagnosis not present

## 2017-04-09 DIAGNOSIS — R1030 Lower abdominal pain, unspecified: Secondary | ICD-10-CM | POA: Diagnosis present

## 2017-04-09 DIAGNOSIS — O468X1 Other antepartum hemorrhage, first trimester: Secondary | ICD-10-CM

## 2017-04-09 DIAGNOSIS — O99332 Smoking (tobacco) complicating pregnancy, second trimester: Secondary | ICD-10-CM | POA: Diagnosis not present

## 2017-04-09 DIAGNOSIS — O26891 Other specified pregnancy related conditions, first trimester: Secondary | ICD-10-CM

## 2017-04-09 DIAGNOSIS — Z3A08 8 weeks gestation of pregnancy: Secondary | ICD-10-CM | POA: Diagnosis not present

## 2017-04-09 DIAGNOSIS — O418X10 Other specified disorders of amniotic fluid and membranes, first trimester, not applicable or unspecified: Secondary | ICD-10-CM

## 2017-04-09 LAB — URINALYSIS, ROUTINE W REFLEX MICROSCOPIC
Bilirubin Urine: NEGATIVE
GLUCOSE, UA: NEGATIVE mg/dL
Hgb urine dipstick: NEGATIVE
Ketones, ur: NEGATIVE mg/dL
Leukocytes, UA: NEGATIVE
NITRITE: NEGATIVE
PH: 5 (ref 5.0–8.0)
Protein, ur: NEGATIVE mg/dL
SPECIFIC GRAVITY, URINE: 1.03 (ref 1.005–1.030)

## 2017-04-09 NOTE — MAU Note (Signed)
States she found out at end of March she was pregnant. Over last several days she has had a lot of lower abdominal cramping-rates 5/10. Denies bleeding. Has a lot of nausea, denies vomiting, some dry heaving.

## 2017-04-10 ENCOUNTER — Inpatient Hospital Stay (HOSPITAL_COMMUNITY): Payer: Medicaid Other

## 2017-04-10 ENCOUNTER — Encounter (HOSPITAL_COMMUNITY): Payer: Self-pay | Admitting: Advanced Practice Midwife

## 2017-04-10 DIAGNOSIS — O219 Vomiting of pregnancy, unspecified: Secondary | ICD-10-CM | POA: Diagnosis not present

## 2017-04-10 MED ORDER — ONDANSETRON 4 MG PO TBDP
4.0000 mg | ORAL_TABLET | Freq: Once | ORAL | Status: AC
Start: 2017-04-10 — End: 2017-04-10
  Administered 2017-04-10: 4 mg via ORAL
  Filled 2017-04-10: qty 1

## 2017-04-10 MED ORDER — ONDANSETRON 4 MG PO TBDP: 4.0000 mg | ORAL_TABLET | Freq: Four times a day (QID) | ORAL | 0 refills | Status: DC | PRN

## 2017-04-10 NOTE — Discharge Instructions (Signed)
Abdominal Pain During Pregnancy °Belly (abdominal) pain is common during pregnancy. Most of the time, it is not a serious problem. Other times, it can be a sign that something is wrong with the pregnancy. Always tell your doctor if you have belly pain. °Follow these instructions at home: °Monitor your belly pain for any changes. The following actions may help you feel better: °· Do not have sex (intercourse) or put anything in your vagina until you feel better. °· Rest until your pain stops. °· Drink clear fluids if you feel sick to your stomach (nauseous). Do not eat solid food until you feel better. °· Only take medicine as told by your doctor. °· Keep all doctor visits as told. °Get help right away if: °· You are bleeding, leaking fluid, or pieces of tissue come out of your vagina. °· You have more pain or cramping. °· You keep throwing up (vomiting). °· You have pain when you pee (urinate) or have blood in your pee. °· You have a fever. °· You do not feel your baby moving as much. °· You feel very weak or feel like passing out. °· You have trouble breathing, with or without belly pain. °· You have a very bad headache and belly pain. °· You have fluid leaking from your vagina and belly pain. °· You keep having watery poop (diarrhea). °· Your belly pain does not go away after resting, or the pain gets worse. °This information is not intended to replace advice given to you by your health care provider. Make sure you discuss any questions you have with your health care provider. °Document Released: 11/20/2009 Document Revised: 07/10/2016 Document Reviewed: 07/01/2013 °Elsevier Interactive Patient Education © 2017 [employer] ° °Morning Sickness °Morning sickness is when you feel sick to your stomach (nauseous) during pregnancy. You may feel sick to your stomach and throw up (vomit). You may feel sick in the morning, but you can feel this way any time of day. Some women feel very sick to their stomach and cannot  stop throwing up (hyperemesis gravidarum). °Follow these instructions at home: °· Only take medicines as told by your doctor. °· Take multivitamins as told by your doctor. Taking multivitamins before getting pregnant can stop or lessen the harshness of morning sickness. °· Eat dry toast or unsalted crackers before getting out of bed. °· Eat 5 to 6 small meals a day. °· Eat dry and bland foods like rice and baked potatoes. °· Do not drink liquids with meals. Drink between meals. °· Do not eat greasy, fatty, or spicy foods. °· Have someone cook for you if the smell of food causes you to feel sick or throw up. °· If you feel sick to your stomach after taking prenatal vitamins, take them at night or with a snack. °· Eat protein when you need a snack (nuts, yogurt, cheese). °· Eat unsweetened gelatins for dessert. °· Wear a bracelet used for sea sickness (acupressure wristband). °· Go to a doctor that puts thin needles into certain body points (acupuncture) to improve how you feel. °· Do not smoke. °· Use a humidifier to keep the air in your house free of odors. °· Get lots of fresh air. °Contact a doctor if: °· You need medicine to feel better. °· You feel dizzy or lightheaded. °· You are losing weight. °Get help right away if: °· You feel very sick to your stomach and cannot stop throwing up. °· You pass out (faint). °This information is not intended   to replace advice given to you by your health care provider. Make sure you discuss any questions you have with your health care provider. °Document Released: 01/09/2005 Document Revised: 05/09/2016 Document Reviewed: 05/19/2013 °Elsevier Interactive Patient Education © 2017 [employer]. ° °

## 2017-04-10 NOTE — MAU Provider Note (Signed)
Chief Complaint: Nausea   First Provider Initiated Contact with Patient 04/10/17 0009        SUBJECTIVE HPI: Lauren Conway is a 26 y.o. [id number] at [redacted]w[redacted]d by LMP who presents to maternity admissions reporting lower abdominal pain. Also has nausea, without vomiting.  . She denies vaginal bleeding, vaginal itching/burning, urinary symptoms, h/a, dizziness, or fever/chills.    Had an [place] in office on 03/25/17 showing a [redacted]w[redacted]d fetus with HR 94bpm.  Just had a miscarriage on 02/05/17.   Abdominal Pain  This is a new problem. The current episode started in the past 7 days. The onset quality is gradual. The problem occurs intermittently. The problem has been waxing and waning. The pain is located in the suprapubic region. The abdominal pain does not radiate. Associated symptoms include nausea. Pertinent negatives include no constipation, diarrhea, dysuria, fever, myalgias or vomiting. Nothing aggravates the pain. The pain is relieved by nothing. She has tried nothing for the symptoms.    RN Note: States she found out at end of March she was pregnant. Over last several days she has had a lot of lower abdominal cramping-rates 5/10. Denies bleeding. Has a lot of nausea, denies vomiting, some dry heaving.     Electronically signed by Brand Males, RN at 04/09/2017 10:14 PM      Past Medical History:  Diagnosis Date  . Anxiety   . Asthma    uses inhaler, last used 8/22  . Depression   . History of chlamydia    12/2008; 03/09/2009  . History of gonorrhea    12/2008; 09/2010;   . 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 NO PAST SURGERIES     Social History   Social History  . Marital status: Single    Spouse name: N/A  . Number of children: N/A  . Years of education: N/A   Occupational History  . Not on file.   Social History Main Topics  . Smoking status: Current Every Day Smoker    Packs/day: 0.25    Years:  0.50  . Smokeless tobacco: Never Used  . Alcohol use No     Comment: Rare  . Drug use: No     Comment: occasionally  . Sexual activity: Yes    Partners: Male    Birth control/ protection: None   Other Topics Concern  . Not on file   Social History Narrative  . No narrative on file   No current facility-administered medications on file prior to encounter.    Current Outpatient Prescriptions on File Prior to Encounter  Medication Sig Dispense Refill  . acetaminophen (TYLENOL) 500 MG tablet Take 1,000 mg by mouth every 6 (six) hours as needed for moderate pain.     Marland Kitchen albuterol (PROVENTIL HFA;VENTOLIN HFA) 108 (90 Base) MCG/ACT inhaler Inhale 1-2 puffs into the lungs every 6 (six) hours as needed for wheezing or shortness of breath. (Patient not taking: Reported on 03/25/2017) 1 Inhaler prn  . cetirizine (ZYRTEC) 10 MG chewable tablet Chew 10 mg by mouth daily.    Marland Kitchen ibuprofen (ADVIL,MOTRIN) 800 MG tablet Take 1 tablet (800 mg total) by mouth every 8 (eight) hours as needed. (Patient not taking: Reported on 03/25/2017) 30 tablet 5  . metroNIDAZOLE (METROGEL VAGINAL) 0.75 % vaginal gel Place 1 Applicatorful vaginally 2 (two) times daily. 70 g 2  . norgestimate-ethinyl estradiol (ORTHO-CYCLEN,SPRINTEC,PREVIFEM) 0.25-35 MG-MCG tablet Take 1 tablet by  mouth daily. (Patient not taking: Reported on 03/25/2017) 1 Package 11  . oxyCODONE-acetaminophen (PERCOCET/ROXICET) 5-325 MG tablet Take 1-2 tablets by mouth every 6 (six) hours as needed. (Patient not taking: Reported on 03/25/2017) 20 tablet 0  . Prenatal Vit-Fe Fumarate-FA (PRENATAL MULTIVITAMIN) TABS tablet Take 1 tablet by mouth daily at 12 noon.    . promethazine (PHENERGAN) 12.5 MG tablet Take 1 tablet (12.5 mg total) by mouth every 6 (six) hours as needed for nausea. 30 tablet 0  . promethazine (PHENERGAN) 25 MG tablet Take 1 tablet (25 mg total) by mouth every 6 (six) hours as needed for nausea or vomiting. (Patient not taking: Reported on  02/21/2017) 20 tablet 0  . valACYclovir (VALTREX) 1000 MG tablet Take 1 tab po bid x 3 days prn each outbreak 30 tablet prn   Allergies  Allergen Reactions  . Eggs Or Egg-Derived Products Hives and Swelling  . Latex Itching and Swelling    I have reviewed patient's Past Medical Hx, Surgical Hx, Family Hx, Social Hx, medications and allergies.   ROS:  Review of Systems  Constitutional: Negative for fever.  Gastrointestinal: Positive for abdominal pain and nausea. Negative for constipation, diarrhea and vomiting.  Genitourinary: Negative for dysuria.  Musculoskeletal: Negative for myalgias.   Review of Systems  Other systems negative   Physical Exam  Physical Exam Patient Vitals for the past 24 hrs:  BP Temp Temp src Pulse Resp SpO2 Height Weight  04/09/17 2214 125/71 98.4 F (36.9 C) Oral 92 18 100 % 5' 3.5" (1.613 m) 184 lb (83.5 kg)   Constitutional: Well-developed, well-nourished female in no acute distress.  Cardiovascular: normal rate Respiratory: normal effort GI: Abd soft, non-tender. Pos BS x 4 MS: Extremities nontender, no edema, normal ROM Neurologic: Alert and oriented x 4.  GU: Neg CVAT.  PELVIC EXAM: Cervix 0/long/high, firm, anterior, neg CMT, uterus nontender, enlarged to 8-9 wk size, adnexa without tenderness, enlargement, or mass   LAB RESULTS Results for orders placed or performed during the hospital encounter of 04/09/17 (from the past 24 hour(s))  Urinalysis, Routine w reflex microscopic     Status: Abnormal   Collection Time: 04/09/17 10:17 PM  Result Value Ref Range   Color, Urine YELLOW YELLOW   APPearance HAZY (A) CLEAR   Specific Gravity, Urine 1.030 1.005 - 1.030   pH 5.0 5.0 - 8.0   Glucose, UA NEGATIVE NEGATIVE mg/dL   Hgb urine dipstick NEGATIVE NEGATIVE   Bilirubin Urine NEGATIVE NEGATIVE   Ketones, ur NEGATIVE NEGATIVE mg/dL   Protein, ur NEGATIVE NEGATIVE mg/dL   Nitrite NEGATIVE NEGATIVE   Leukocytes, UA NEGATIVE NEGATIVE        IMAGING US Ob Comp Less 14 Wks  Result Date: 03/21/2017 CLINICAL DATA:  Recent spontaneous abortion. New first-trimester pregnancy. Uncertain LMP. Assess viability and dating. EXAM: OBSTETRIC <14 WK [place] AND TRANSVAGINAL OB US TECHNIQUE: Both transabdominal and transvaginal ultrasound examinations were performed for complete evaluation of the gestation as well as the maternal uterus, adnexal regions, and pelvic cul-de-sac. Transvaginal technique was performed to assess early pregnancy. COMPARISON:  02/04/2017 FINDINGS: Intrauterine gestational sac: Single Yolk sac:  Visualized. Embryo:  Visualized. Cardiac Activity: Visualized. Heart Rate: 94  bpm CRL:  6  mm   6 w   2 d                  [place] EDC: 11/12/2017 Subchorionic hemorrhage:  None visualized. Maternal uterus/adnexae: Small right ovarian corpus luteum. Normal appearance of left  ovary. No adnexal mass or abnormal free fluid identified. IMPRESSION: Single living IUP measuring 6 weeks 2 days, with [place] EDC of 11/12/2017. No significant maternal uterine or adnexal abnormality identified. Electronically Signed   By: Myles Rosenthal M.D.   On: 03/21/2017 16:01   US Ob Transvaginal  Result Date: 04/10/2017 CLINICAL DATA:  Acute onset of pelvic pain.  Initial encounter. EXAM: TRANSVAGINAL OB ULTRASOUND TECHNIQUE: Transvaginal ultrasound was performed for complete evaluation of the gestation as well as the maternal uterus, adnexal regions, and pelvic cul-de-sac. COMPARISON:  Pelvic ultrasound performed 03/21/2017 FINDINGS: Intrauterine gestational sac: Single; visualized and normal in shape. Yolk sac:  Yes Embryo:  Yes Cardiac Activity: Yes Heart Rate: 161 bpm CRL:   2.08 cm   8 w 4 d                  [place] EDC: 11/16/2017 Subchorionic hemorrhage: A small amount of subchorionic hemorrhage is noted. Maternal uterus/adnexae: The uterus is otherwise unremarkable. The right ovary is not well characterized due to overlying bowel gas. The left ovary is unremarkable, measuring  4.0 x 1.9 x 2.8 cm. No suspicious adnexal masses are seen; there is no evidence for ovarian torsion. No free fluid is seen within the pelvic cul-de-sac. IMPRESSION: 1. Single live intrauterine pregnancy noted, with a crown-rump length of 2.1 cm, corresponding to a gestational age of [redacted] weeks 4 days. This matches the gestational age of [redacted] weeks 1 day by LMP, reflecting an estimated date of delivery of November 12, 2017. 2. Small amount of subchorionic hemorrhage noted. Electronically Signed   By: Roanna Raider M.D.   On: 04/10/2017 01:25   US Ob Transvaginal  Result Date: 03/21/2017 CLINICAL DATA:  Recent spontaneous abortion. New first-trimester pregnancy. Uncertain LMP. Assess viability and dating. EXAM: OBSTETRIC <14 WK [place] AND TRANSVAGINAL OB US TECHNIQUE: Both transabdominal and transvaginal ultrasound examinations were performed for complete evaluation of the gestation as well as the maternal uterus, adnexal regions, and pelvic cul-de-sac. Transvaginal technique was performed to assess early pregnancy. COMPARISON:  02/04/2017 FINDINGS: Intrauterine gestational sac: Single Yolk sac:  Visualized. Embryo:  Visualized. Cardiac Activity: Visualized. Heart Rate: 94  bpm CRL:  6  mm   6 w   2 d                  [place] EDC: 11/12/2017 Subchorionic hemorrhage:  None visualized. Maternal uterus/adnexae: Small right ovarian corpus luteum. Normal appearance of left ovary. No adnexal mass or abnormal free fluid identified. IMPRESSION: Single living IUP measuring 6 weeks 2 days, with [place] EDC of 11/12/2017. No significant maternal uterine or adnexal abnormality identified. Electronically Signed   By: Myles Rosenthal M.D.   On: 03/21/2017 16:01    MAU Management/MDM: Will check repeat Ultrasound to rule out SAB due to pain and borderline low HR on last [place] .  Cultures were done to rule out pelvic infection Blood drawn for Quant HCG, CBC, ABO/Rh  This bleeding/pain can represent a normal pregnancy with bleeding, spontaneous  abortion.  The process as listed above helps to determine which of these is present.  [place] confirmed single live IUP.  Patient informed of small [facility]. May see spotting from time to time but baby is growing well.   ASSESSMENT 1. Pelvic pain affecting pregnancy in first trimester, antepartum   2. Pelvic pain affecting pregnancy in first trimester, antepartum   3.      Live single IUP  PLAN Discharge home Bleeding precautions Encouraged to  start prenatal care  Pt stable at time of discharge. Encouraged to return here or to other Urgent Care/ED if she develops worsening of symptoms, increase in pain, fever, or other concerning symptoms.    Wynelle Bourgeois CNM, MSN Certified Nurse-Midwife 04/10/2017  1:36 AM

## 2017-04-29 ENCOUNTER — Telehealth: Payer: Self-pay | Admitting: *Deleted

## 2017-04-29 ENCOUNTER — Encounter: Payer: Medicaid Other | Admitting: Obstetrics

## 2017-04-29 NOTE — Telephone Encounter (Signed)
Pt called to office stating she missed her NOB appt today. Pt is awaiting phone call to reschedule appt.  Pt states that she was given Zofran at hospital visit for nausea but states that staff urged her to only use if necessary due to possible effects on fetus.  Pt states that she has only taken a few times and seems to vomit worse once she has taken it. Pt would like to know if there is any thing else she could be given for the severe nausea she is having. Pt advised message would be sent to provider regarding Rx, ?Diclegis.   Please advise on new Rx for nausea.

## 2017-04-30 ENCOUNTER — Other Ambulatory Visit: Payer: Self-pay | Admitting: Obstetrics

## 2017-04-30 DIAGNOSIS — O219 Vomiting of pregnancy, unspecified: Secondary | ICD-10-CM

## 2017-04-30 MED ORDER — DOXYLAMINE-PYRIDOXINE 10-10 MG PO TBEC: DELAYED_RELEASE_TABLET | ORAL | 5 refills | Status: DC

## 2017-04-30 NOTE — Telephone Encounter (Signed)
Diclegis Rx

## 2017-05-01 ENCOUNTER — Encounter: Payer: Self-pay | Admitting: Certified Nurse Midwife

## 2017-05-01 ENCOUNTER — Other Ambulatory Visit (HOSPITAL_COMMUNITY)
Admission: RE | Admit: 2017-05-01 | Discharge: 2017-05-01 | Disposition: A | Discharge status: Home / Self Care | Payer: Medicaid Other | Source: Ambulatory Visit | Attending: Obstetrics | Admitting: Obstetrics

## 2017-05-01 ENCOUNTER — Ambulatory Visit (INDEPENDENT_AMBULATORY_CARE_PROVIDER_SITE_OTHER): Payer: Medicaid Other | Admitting: Certified Nurse Midwife

## 2017-05-01 VITALS — BP 101/68 | HR 76 | Wt 182.0 lb

## 2017-05-01 DIAGNOSIS — Z3481 Encounter for supervision of other normal pregnancy, first trimester: Secondary | ICD-10-CM

## 2017-05-01 DIAGNOSIS — Z3480 Encounter for supervision of other normal pregnancy, unspecified trimester: Secondary | ICD-10-CM | POA: Insufficient documentation

## 2017-05-01 DIAGNOSIS — O219 Vomiting of pregnancy, unspecified: Secondary | ICD-10-CM

## 2017-05-01 DIAGNOSIS — A609 Anogenital herpesviral infection, unspecified: Secondary | ICD-10-CM | POA: Insufficient documentation

## 2017-05-01 MED ORDER — PRENATE PIXIE 10-0.6-0.4-200 MG PO CAPS: 1.0000 | ORAL_CAPSULE | Freq: Every day | ORAL | 12 refills | Status: DC

## 2017-05-01 MED ORDER — DOXYLAMINE-PYRIDOXINE ER 20-20 MG PO TBCR: 1.0000 | EXTENDED_RELEASE_TABLET | Freq: Two times a day (BID) | ORAL | 6 refills | Status: DC

## 2017-05-01 NOTE — Telephone Encounter (Signed)
Pt aware and Rx has been approved with [employer]

## 2017-05-01 NOTE — Progress Notes (Signed)
Subjective:    Lauren Conway is being seen today for her first obstetrical visit.  This is a planned pregnancy. She is at [redacted]w[redacted]d gestation. Her obstetrical history is significant for none, does have a hx of marijuana use. Relationship with FOB: significant other, not living together. Patient does intend to breast feed. Pregnancy history fully reviewed.  The information documented in the HPI was reviewed and verified.  Menstrual History: OB History    Gravida Para Term Preterm AB Living   5 2 2   2 2    SAB TAB Ectopic Multiple Live Births   2     0 2       Patient's last menstrual period was 12/04/2016 (lmp unknown).    Past Medical History:  Diagnosis Date  . Anxiety   . Asthma    uses inhaler, last used 8/22  . Depression   . History of chlamydia    12/2008; 03/09/2009  . History of gonorrhea    12/2008; 09/2010;   . 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 NO PAST SURGERIES       (Not in a hospital admission) Allergies  Allergen Reactions  . Eggs Or Egg-Derived Products Hives and Swelling  . Latex Itching and Swelling    Social History  Substance Use Topics  . Smoking status: Former Smoker    Packs/day: 0.25    Years: 0.50    Quit date: 03/16/2017  . Smokeless tobacco: Never Used  . Alcohol use No     Comment: Rare    Family History  Problem Relation Age of Onset  . Hypertension Mother   . Breast cancer Paternal Grandmother   . Asthma Maternal Grandmother   . Other Neg Hx      Review of Systems Constitutional: negative for weight loss Gastrointestinal: + for nausea & vomiting Genitourinary:negative for genital lesions and vaginal discharge and dysuria Musculoskeletal:negative for back pain Behavioral/Psych: negative for abusive relationship, depression, illegal drug usage and tobacco use    Objective:    BP 101/68   Pulse 76   Wt 182 lb (82.6 kg)   LMP 12/04/2016 (LMP  Unknown)   BMI 31.73 kg/m  General Appearance:    Alert, cooperative, no distress, appears stated age  Head:    Normocephalic, without obvious abnormality, atraumatic  Eyes:    PERRL, conjunctiva/corneas clear, EOM's intact, fundi    benign, both eyes  Ears:    Normal TM's and external ear canals, both ears  Nose:   Nares normal, septum midline, mucosa normal, no drainage    or sinus tenderness  Throat:   Lips, mucosa, and tongue normal; teeth and gums normal  Neck:   Supple, symmetrical, trachea midline, no adenopathy;    thyroid:  no enlargement/tenderness/nodules; no carotid   bruit or JVD  Back:     Symmetric, no curvature, ROM normal, no CVA tenderness  Lungs:     Clear to auscultation bilaterally, respirations unlabored  Chest Wall:    No tenderness or deformity   Heart:    Regular rate and rhythm, S1 and S2 normal, no murmur, rub   or gallop  Breast Exam:    No tenderness, masses, or nipple abnormality  Abdomen:     Soft, non-tender, bowel sounds active all four quadrants,    no masses, no organomegaly  Genitalia:    Normal female without lesion, discharge or tenderness  Extremities:  Extremities normal, atraumatic, no cyanosis or edema  Pulses:   2+ and symmetric all extremities  Skin:   Skin color, texture, turgor normal, no rashes or lesions  Lymph nodes:   Cervical, supraclavicular, and axillary nodes normal  Neurologic:   CNII-XII intact, normal strength, sensation and reflexes    throughout           Cervix: long, thick, closed and posterior.  FHR: 160's by doppler.  FH: c/w dates    Lab Review Urine pregnancy test Labs reviewed yes Radiologic studies reviewed yes Assessment:    Pregnancy at [redacted]w[redacted]d weeks    Plan:      Prenatal vitamins.  Counseling provided regarding continued use of seat belts, cessation of alcohol consumption, smoking or use of illicit drugs; infection precautions i.e., influenza/TDAP immunizations, toxoplasmosis,CMV, parvovirus, listeria and  varicella; workplace safety, exercise during pregnancy; routine dental care, safe medications, sexual activity, hot tubs, saunas, pools, travel, caffeine use, fish and methlymercury, potential toxins, hair treatments, varicose veins Weight gain recommendations per IOM guidelines reviewed: underweight/BMI< 18.5--> gain 28 - 40 lbs; normal weight/BMI 18.5 - 24.9--> gain 25 - 35 lbs; overweight/BMI 25 - 29.9--> gain 15 - 25 lbs; obese/BMI >30->gain  11 - 20 lbs Problem list reviewed and updated. FIRST/CF mutation testing/NIPT/QUAD SCREEN/fragile X/Ashkenazi Jewish population testing/Spinal muscular atrophy discussed: ordered. Role of ultrasound in pregnancy discussed; fetal survey: ordered. Amniocentesis discussed: not indicated.  Meds ordered this encounter  Medications  . Doxylamine-Pyridoxine ER (BONJESTA) 20-20 MG TBCR    Sig: Take 1 tablet by mouth 2 (two) times daily.    Dispense:  60 tablet    Refill:  6  . Prenat-FeAsp-Meth-FA-DHA w/o A (PRENATE PIXIE) 10-0.6-0.4-200 MG CAPS    Sig: Take 1 tablet by mouth daily.    Dispense:  30 capsule    Refill:  12    Please process coupon: Rx BIN: [id number], RxPCN: [id number], RxGRP: [id number], RxID: [id number]  SUF: 01   Orders Placed This Encounter  Procedures  . Culture, OB Urine  . [place] MFM OB COMP + 14 WK    Standing Status:   Future    Standing Expiration Date:   07/01/2018    Order Specific Question:   Reason for Exam (SYMPTOM  OR DIAGNOSIS REQUIRED)    Answer:   fetal anatomy scan    Order Specific Question:   Preferred imaging location?    Answer:   [facility]-Ultrasound  . Varicella zoster antibody, IgG  . MaterniT21 PLUS Core+SCA    Order Specific Question:   Is the patient insulin dependent?    Answer:   No    Order Specific Question:   Please enter gestational age. This should be expressed as weeks AND days, i.e. 16w 6d. Enter weeks here. Enter days in next question.    Answer:   26    Order Specific Question:   Please enter gestational age.  This should be expressed as weeks AND days, i.e. 16w 6d. Enter days here. Enter weeks in previous question.    Answer:   1    Order Specific Question:   How was gestational age calculated?    Answer:   Ultrasound    Order Specific Question:   Please give the date of LMP OR Ultrasound OR Estimated date of delivery.    Answer:   11/12/2017    Order Specific Question:   Number of Fetuses (Type of Pregnancy):    Answer:   1    Order Specific Question:  Indications for performing the test? (please choose all that apply):    Answer:   Routine screening    Order Specific Question:   Other Indications? (Y=Yes, N=No)    Answer:   N    Order Specific Question:   If this is a repeat specimen, please indicate the reason:    Answer:   Not indicated    Order Specific Question:   Please specify the patient's race: (C=White/Caucasion, B=Black, I=Native American, A=Asian, H=Hispanic, O=Other, U=Unknown)    Answer:   B    Order Specific Question:   Donor Egg - indicate if the egg was obtained from in vitro fertilization.    Answer:   N    Order Specific Question:   Age of Egg Donor.    Answer:   [age]    Order Specific Question:   Prior Down Syndrome/ONTD screening during current pregnancy.    Answer:   N    Order Specific Question:   Prior First Trimester Testing    Answer:   N    Order Specific Question:   Prior Second Trimester Testing    Answer:   N    Order Specific Question:   Family History of Neural Tube Defects    Answer:   N    Order Specific Question:   Prior Pregnancy with Down Syndrome    Answer:   N    Order Specific Question:   Please give the patient's weight (in pounds)    Answer:   182  . Hemoglobinopathy evaluation  . Hemoglobin A1c  . Obstetric Panel, Including HIV  . Cystic Fibrosis Mutation 97  . Informed Consent Details: Transcribe to consent form and obtain patient signature    This order was created through External Result Entry    Follow up in 4 weeks. 50% of 30 min  visit spent on counseling and coordination of care.

## 2017-05-01 NOTE — Progress Notes (Signed)
Patient is in the office for initial ob visit, reports feeling pelvic pain occasionally, denies contractions bleeding.

## 2017-05-05 LAB — OBSTETRIC PANEL, INCLUDING HIV
Antibody Screen: NEGATIVE
BASOS ABS: 0 10*3/uL (ref 0.0–0.2)
Basos: 0 %
EOS (ABSOLUTE): 0.1 10*3/uL (ref 0.0–0.4)
Eos: 2 %
HEP B S AG: NEGATIVE
HIV Screen 4th Generation wRfx: NONREACTIVE
Hematocrit: 37.9 % (ref 34.0–46.6)
Hemoglobin: 12.4 g/dL (ref 11.1–15.9)
IMMATURE GRANULOCYTES: 0 %
Immature Grans (Abs): 0 10*3/uL (ref 0.0–0.1)
LYMPHS: 27 %
Lymphocytes Absolute: 2.4 10*3/uL (ref 0.7–3.1)
MCH: 29.5 pg (ref 26.6–33.0)
MCHC: 32.7 g/dL (ref 31.5–35.7)
MCV: 90 fL (ref 79–97)
MONOCYTES: 3 %
Monocytes Absolute: 0.3 10*3/uL (ref 0.1–0.9)
NEUTROS PCT: 68 %
Neutrophils Absolute: 6.1 10*3/uL (ref 1.4–7.0)
PLATELETS: 356 10*3/uL (ref 150–379)
RBC: 4.21 x10E6/uL (ref 3.77–5.28)
RDW: 13.5 % (ref 12.3–15.4)
RPR: NONREACTIVE
RUBELLA: 3.89 {index} (ref >0.99)
Rh Factor: POSITIVE
WBC: 8.9 10*3/uL (ref 3.4–10.8)

## 2017-05-05 LAB — HEMOGLOBINOPATHY EVALUATION
HGB A: 97.5 % (ref 96.4–98.8)
HGB C: 0 %
HGB S: 0 %
HGB VARIANT: 0 %
Hemoglobin A2 Quantitation: 2.5 % (ref 1.8–3.2)
Hemoglobin F Quantitation: 0 % (ref 0.0–2.0)

## 2017-05-05 LAB — CULTURE, OB URINE

## 2017-05-05 LAB — HEMOGLOBIN A1C
ESTIMATED AVERAGE GLUCOSE: 111 mg/dL
HEMOGLOBIN A1C: 5.5 % (ref 4.8–5.6)

## 2017-05-05 LAB — URINE CULTURE, OB REFLEX

## 2017-05-05 LAB — VARICELLA ZOSTER ANTIBODY, IGG

## 2017-05-06 ENCOUNTER — Other Ambulatory Visit: Payer: Self-pay | Admitting: Certified Nurse Midwife

## 2017-05-06 DIAGNOSIS — Z283 Underimmunization status: Principal | ICD-10-CM

## 2017-05-06 DIAGNOSIS — O09899 Supervision of other high risk pregnancies, unspecified trimester: Secondary | ICD-10-CM | POA: Insufficient documentation

## 2017-05-06 DIAGNOSIS — Z2839 Other underimmunization status: Secondary | ICD-10-CM

## 2017-05-06 LAB — CERVICOVAGINAL ANCILLARY ONLY
BACTERIAL VAGINITIS: POSITIVE — AB
CANDIDA VAGINITIS: NEGATIVE
Chlamydia: NEGATIVE
Neisseria Gonorrhea: NEGATIVE
Trichomonas: NEGATIVE

## 2017-05-07 LAB — MATERNIT21 PLUS CORE+SCA
Chromosome 13: NEGATIVE
Chromosome 18: NEGATIVE
Chromosome 21: NEGATIVE
Y CHROMOSOME: NOT DETECTED

## 2017-05-07 LAB — CYSTIC FIBROSIS MUTATION 97: Interpretation: NOT DETECTED

## 2017-05-08 ENCOUNTER — Encounter: Payer: Medicaid Other | Admitting: Obstetrics

## 2017-05-08 ENCOUNTER — Other Ambulatory Visit: Payer: Self-pay | Admitting: Certified Nurse Midwife

## 2017-05-08 DIAGNOSIS — N760 Acute vaginitis: Principal | ICD-10-CM

## 2017-05-08 DIAGNOSIS — Z3480 Encounter for supervision of other normal pregnancy, unspecified trimester: Secondary | ICD-10-CM

## 2017-05-08 DIAGNOSIS — B9689 Other specified bacterial agents as the cause of diseases classified elsewhere: Secondary | ICD-10-CM

## 2017-05-08 MED ORDER — METRONIDAZOLE 0.75 % VA GEL: 1.0000 | Freq: Two times a day (BID) | VAGINAL | 0 refills | Status: DC

## 2017-05-11 ENCOUNTER — Encounter: Payer: Self-pay | Admitting: Obstetrics & Gynecology

## 2017-05-11 DIAGNOSIS — O99320 Drug use complicating pregnancy, unspecified trimester: Secondary | ICD-10-CM | POA: Insufficient documentation

## 2017-05-16 ENCOUNTER — Other Ambulatory Visit: Payer: Self-pay | Admitting: Certified Nurse Midwife

## 2017-05-16 DIAGNOSIS — B9689 Other specified bacterial agents as the cause of diseases classified elsewhere: Secondary | ICD-10-CM

## 2017-05-16 DIAGNOSIS — N760 Acute vaginitis: Principal | ICD-10-CM

## 2017-05-24 ENCOUNTER — Encounter (HOSPITAL_COMMUNITY): Payer: Self-pay | Admitting: Certified Nurse Midwife

## 2017-05-24 ENCOUNTER — Inpatient Hospital Stay (HOSPITAL_COMMUNITY)
Admission: AD | Admit: 2017-05-24 | Discharge: 2017-05-24 | Disposition: A | Discharge status: Home / Self Care | Payer: Medicaid Other | Source: Ambulatory Visit | Attending: Obstetrics and Gynecology | Admitting: Obstetrics and Gynecology

## 2017-05-24 DIAGNOSIS — O99320 Drug use complicating pregnancy, unspecified trimester: Secondary | ICD-10-CM

## 2017-05-24 DIAGNOSIS — O99611 Diseases of the digestive system complicating pregnancy, first trimester: Secondary | ICD-10-CM | POA: Insufficient documentation

## 2017-05-24 DIAGNOSIS — Z87891 Personal history of nicotine dependence: Secondary | ICD-10-CM | POA: Diagnosis not present

## 2017-05-24 DIAGNOSIS — F419 Anxiety disorder, unspecified: Secondary | ICD-10-CM | POA: Diagnosis not present

## 2017-05-24 DIAGNOSIS — O99512 Diseases of the respiratory system complicating pregnancy, second trimester: Secondary | ICD-10-CM | POA: Insufficient documentation

## 2017-05-24 DIAGNOSIS — K219 Gastro-esophageal reflux disease without esophagitis: Secondary | ICD-10-CM | POA: Insufficient documentation

## 2017-05-24 DIAGNOSIS — O99342 Other mental disorders complicating pregnancy, second trimester: Secondary | ICD-10-CM | POA: Diagnosis not present

## 2017-05-24 DIAGNOSIS — Z3A15 15 weeks gestation of pregnancy: Secondary | ICD-10-CM | POA: Insufficient documentation

## 2017-05-24 DIAGNOSIS — J45909 Unspecified asthma, uncomplicated: Secondary | ICD-10-CM | POA: Diagnosis not present

## 2017-05-24 DIAGNOSIS — O26891 Other specified pregnancy related conditions, first trimester: Secondary | ICD-10-CM | POA: Diagnosis present

## 2017-05-24 DIAGNOSIS — O9989 Other specified diseases and conditions complicating pregnancy, childbirth and the puerperium: Secondary | ICD-10-CM | POA: Diagnosis not present

## 2017-05-24 DIAGNOSIS — O219 Vomiting of pregnancy, unspecified: Secondary | ICD-10-CM

## 2017-05-24 LAB — URINALYSIS, ROUTINE W REFLEX MICROSCOPIC
BILIRUBIN URINE: NEGATIVE
Glucose, UA: NEGATIVE mg/dL
Hgb urine dipstick: NEGATIVE
KETONES UR: NEGATIVE mg/dL
LEUKOCYTES UA: NEGATIVE
Nitrite: NEGATIVE
PH: 5 (ref 5.0–8.0)
Protein, ur: NEGATIVE mg/dL
SPECIFIC GRAVITY, URINE: 1.023 (ref 1.005–1.030)

## 2017-05-24 MED ORDER — ONDANSETRON 8 MG PO TBDP
8.0000 mg | ORAL_TABLET | Freq: Once | ORAL | Status: AC
  Administered 2017-05-24: 8 mg via ORAL
  Filled 2017-05-24: qty 1

## 2017-05-24 MED ORDER — GI COCKTAIL ~~LOC~~
30.0000 mL | Freq: Once | ORAL | Status: AC
  Administered 2017-05-24: 30 mL via ORAL
  Filled 2017-05-24: qty 30

## 2017-05-24 MED ORDER — FAMOTIDINE 20 MG PO TABS: 20.0000 mg | ORAL_TABLET | Freq: Two times a day (BID) | ORAL | 2 refills | Status: DC

## 2017-05-24 NOTE — Discharge Instructions (Signed)
Food Choices for Gastroesophageal Reflux Disease, Adult When you have gastroesophageal reflux disease (GERD), the foods you eat and your eating habits are very important. Choosing the right foods can help ease your discomfort. What guidelines do I need to follow?  Choose fruits, vegetables, whole grains, and low-fat dairy products.  Choose low-fat meat, fish, and poultry.  Limit fats such as oils, salad dressings, butter, nuts, and avocado.  Keep a food diary. This helps you identify foods that cause symptoms.  Avoid foods that cause symptoms. These may be different for everyone.  Eat small meals often instead of 3 large meals a day.  Eat your meals slowly, in a place where you are relaxed.  Limit fried foods.  Cook foods using methods other than frying.  Avoid drinking alcohol.  Avoid drinking large amounts of liquids with your meals.  Avoid bending over or lying down until 2-3 hours after eating. What foods are not recommended? These are some foods and drinks that may make your symptoms worse: Vegetables  Tomatoes. Tomato juice. Tomato and spaghetti sauce. Chili peppers. Onion and garlic. Horseradish. Fruits  Oranges, grapefruit, and lemon (fruit and juice). Meats  High-fat meats, fish, and poultry. This includes hot dogs, ribs, ham, sausage, salami, and bacon. Dairy  Whole milk and chocolate milk. Sour cream. Cream. Butter. Ice cream. Cream cheese. Drinks  Coffee and tea. Bubbly (carbonated) drinks or energy drinks. Condiments  Hot sauce. Barbecue sauce. Sweets/Desserts  Chocolate and cocoa. Donuts. Peppermint and spearmint. Fats and Oils  High-fat foods. This includes French fries and potato chips. Other  Vinegar. Strong spices. This includes black pepper, white pepper, red pepper, cayenne, curry powder, cloves, ginger, and chili powder. The items listed above may not be a complete list of foods and drinks to avoid. Contact your dietitian for more information.    This information is not intended to replace advice given to you by your health care provider. Make sure you discuss any questions you have with your health care provider. Document Released: 06/02/2012 Document Revised: 05/09/2016 Document Reviewed: 10/06/2013 Elsevier Interactive Patient Education  2017 [employer]  

## 2017-05-24 NOTE — MAU Note (Signed)
Patient presents to MAU with c/o of epigastric pain, heartburn, nausea and diarrhea. Diarrhea for 3 occurences yesterday but none today. States she has not been around anyone that has been sick. Nausea has been occurring since found out she was pregnant. Denies bleeding and lower abdominal cramping. Has recently been treated for yeast infection but believes she may still have it.

## 2017-05-24 NOTE — MAU Provider Note (Signed)
History     CSN: [id number]  Arrival date and time: 05/24/17 0011   First Provider Initiated Contact with Patient 05/24/17 0056      Chief Complaint  Patient presents with  . Heartburn  . Nausea  . Diarrhea   HPI  [name] is a 26 y.o. [id number] at [redacted]w[redacted]d who presents to MAU today with complaint of epigastric and LUQ abdominal pain for the last few days. The patient also states that she has been having N/V throughout the pregnancy, but also had some loose stools x 3 yesterday. None today. She denies lower abdominal pain, vaginal bleeding, discharge, UTI symptoms, fever or sick contacts. The patient was taking Bonjesta BID and stopped taking it a few days ago. She does also have heartburn, but is not taken anything for it.   OB History    Gravida Para Term Preterm AB Living   5 2 2   2 2    SAB TAB Ectopic Multiple Live Births   2     0 2      Past Medical History:  Diagnosis Date  . Anxiety   . Asthma    uses inhaler, last used 8/22  . Depression   . History of chlamydia    12/2008; 03/09/2009  . History of gonorrhea    12/2008; 09/2010;   . 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 NO PAST SURGERIES      Family History  Problem Relation Age of Onset  . Hypertension Mother   . Breast cancer Paternal Grandmother   . Asthma Maternal Grandmother   . Other Neg Hx     Social History  Substance Use Topics  . Smoking status: Former Smoker    Packs/day: 0.25    Years: 0.50    Quit date: 03/16/2017  . Smokeless tobacco: Never Used  . Alcohol use No     Comment: Rare    Allergies:  Allergies  Allergen Reactions  . Eggs Or Egg-Derived Products Hives and Swelling  . Latex Itching and Swelling    No prescriptions prior to admission.    Review of Systems  Constitutional: Negative for fever.  Gastrointestinal: Positive for abdominal pain and nausea. Negative for constipation,  diarrhea and vomiting.       + heartburn  Genitourinary: Negative for dysuria, frequency, urgency, vaginal bleeding and vaginal discharge.   Physical Exam   Blood pressure (!) 120/55, pulse 87, temperature 98.1 F (36.7 C), temperature source Oral, resp. rate 16, last menstrual period 12/04/2016, not currently breastfeeding.  Physical Exam  Nursing note and vitals reviewed. Constitutional: She is oriented to person, place, and time. She appears well-developed and well-nourished. No distress.  HENT:  Head: Normocephalic and atraumatic.  Cardiovascular: Normal rate.   Respiratory: Effort normal.  GI: Soft. She exhibits no distension and no mass. There is tenderness (very mild LUQ and epigastric tenderness just below the xyphoid process). There is no rebound and no guarding.  Neurological: She is alert and oriented to person, place, and time.  Skin: Skin is warm and dry. No erythema.  Psychiatric: She has a normal mood and affect.    Results for orders placed or performed during the hospital encounter of 05/24/17 (from the past 24 hour(s))  Urinalysis, Routine w reflex microscopic     Status: None   Collection Time: 05/24/17 12:20 AM  Result Value Ref Range   Color, Urine  YELLOW YELLOW   APPearance CLEAR CLEAR   Specific Gravity, Urine 1.023 1.005 - 1.030   pH 5.0 5.0 - 8.0   Glucose, UA NEGATIVE NEGATIVE mg/dL   Hgb urine dipstick NEGATIVE NEGATIVE   Bilirubin Urine NEGATIVE NEGATIVE   Ketones, ur NEGATIVE NEGATIVE mg/dL   Protein, ur NEGATIVE NEGATIVE mg/dL   Nitrite NEGATIVE NEGATIVE   Leukocytes, UA NEGATIVE NEGATIVE    MAU Course  Procedures None  MDM FHR - 155 bpm with doppler UA today GI cocktail and ODT Zofran given - patient reports improvement.  Assessment and Plan  A: SIUP at 7336w3d GERD  P:  Discharge home Rx for Pepcid given to patient to take BID Discussed restarting Bonjesta BID until at least her next office visit GERD diet discussed and included  on AVS Second trimester precautions discussed Patient advised to follow-up with [facility] as scheduled for routine prenatal care or sooner PRN Patient may return to MAU as needed or if her condition were to change or worsen  Vonzella NippleJulie Maksym Pfiffner, PA-C 05/24/2017, 2:44 AM

## 2017-05-29 ENCOUNTER — Ambulatory Visit (INDEPENDENT_AMBULATORY_CARE_PROVIDER_SITE_OTHER): Payer: Medicaid Other | Admitting: Certified Nurse Midwife

## 2017-05-29 VITALS — BP 111/68 | HR 90 | Wt 190.0 lb

## 2017-05-29 DIAGNOSIS — O99612 Diseases of the digestive system complicating pregnancy, second trimester: Secondary | ICD-10-CM

## 2017-05-29 DIAGNOSIS — Z283 Underimmunization status: Secondary | ICD-10-CM

## 2017-05-29 DIAGNOSIS — Z3482 Encounter for supervision of other normal pregnancy, second trimester: Secondary | ICD-10-CM

## 2017-05-29 DIAGNOSIS — Z2839 Other underimmunization status: Secondary | ICD-10-CM

## 2017-05-29 DIAGNOSIS — O09899 Supervision of other high risk pregnancies, unspecified trimester: Secondary | ICD-10-CM

## 2017-05-29 DIAGNOSIS — A609 Anogenital herpesviral infection, unspecified: Secondary | ICD-10-CM

## 2017-05-29 DIAGNOSIS — O09892 Supervision of other high risk pregnancies, second trimester: Secondary | ICD-10-CM

## 2017-05-29 DIAGNOSIS — K219 Gastro-esophageal reflux disease without esophagitis: Secondary | ICD-10-CM

## 2017-05-29 DIAGNOSIS — Z3480 Encounter for supervision of other normal pregnancy, unspecified trimester: Secondary | ICD-10-CM

## 2017-05-29 MED ORDER — OMEPRAZOLE 20 MG PO CPDR: 20.0000 mg | DELAYED_RELEASE_CAPSULE | Freq: Two times a day (BID) | ORAL | 5 refills | Status: DC

## 2017-05-29 NOTE — Progress Notes (Signed)
   PRENATAL VISIT NOTE  Subjective:  Lauren Conway is a 26 y.o. [id number] at 6141w1d being seen today for ongoing prenatal care.  She is currently monitored for the following issues for this low-risk pregnancy and has Chlamydia trachomatis infection; Supervision of other normal pregnancy, antepartum; HSV (herpes simplex virus) anogenital infection; Maternal varicella, non-immune; and Substance abuse affecting pregnancy, antepartum on her problem list.  Patient reports no complaints.  Contractions: Not present. Vag. Bleeding: None.  Movement: Present. Denies leaking of fluid.   The following portions of the patient's history were reviewed and updated as appropriate: allergies, current medications, past family history, past medical history, past social history, past surgical history and problem list. Problem list updated.  Objective:   Vitals:   05/29/17 0916  BP: 111/68  Pulse: 90  Weight: 190 lb (86.2 kg)    Fetal Status: Fetal Heart Rate (bpm): 152   Movement: Present     General:  Alert, oriented and cooperative. Patient is in no acute distress.  Skin: Skin is warm and dry. No rash noted.   Cardiovascular: Normal heart rate noted  Respiratory: Normal respiratory effort, no problems with respiration noted  Abdomen: Soft, gravid, appropriate for gestational age. Pain/Pressure: Present     Pelvic:  Cervical exam deferred        Extremities: Normal range of motion.  Edema: None  Mental Status: Normal mood and affect. Normal behavior. Normal judgment and thought content.   Assessment and Plan:  Pregnancy: [id number] at 5541w1d  1. Supervision of other normal pregnancy, antepartum      - AFP, Serum, Open Spina Bifida  2. Maternal varicella, non-immune     Varicella postpartum  3. HSV (herpes simplex virus) anogenital infection     Valtrex @36  weeks  4. Gastroesophageal reflux during pregnancy, antepartum, second trimester     Has tried Zantac - omeprazole (PRILOSEC) 20 MG capsule;  Take 1 capsule (20 mg total) by mouth 2 (two) times daily before a meal.  Dispense: 60 capsule; Refill: 5  Preterm labor symptoms and general obstetric precautions including but not limited to vaginal bleeding, contractions, leaking of fluid and fetal movement were reviewed in detail with the patient. Please refer to After Visit Summary for other counseling recommendations.  Return in about 4 weeks (around 06/26/2017) for ROB.   Roe Coombsachelle A Xochitl Egle, CNM

## 2017-06-04 ENCOUNTER — Other Ambulatory Visit: Payer: Self-pay | Admitting: Certified Nurse Midwife

## 2017-06-04 DIAGNOSIS — Z3480 Encounter for supervision of other normal pregnancy, unspecified trimester: Secondary | ICD-10-CM

## 2017-06-04 LAB — AFP, SERUM, OPEN SPINA BIFIDA
AFP MoM: 1.16
AFP VALUE AFPOSL: 36.5 ng/mL
Gest. Age on Collection Date: 16.1 weeks
Maternal Age At EDD: 25.7 yr
OSBR Risk 1 IN: 10000
TEST RESULTS AFP: NEGATIVE
Weight: 190 [lb_av]

## 2017-06-07 ENCOUNTER — Inpatient Hospital Stay (HOSPITAL_COMMUNITY)
Admission: AD | Admit: 2017-06-07 | Discharge: 2017-06-07 | Disposition: A | Discharge status: Home / Self Care | Payer: Medicaid Other | Source: Ambulatory Visit | Attending: Obstetrics and Gynecology | Admitting: Obstetrics and Gynecology

## 2017-06-07 ENCOUNTER — Encounter (HOSPITAL_COMMUNITY): Payer: Self-pay | Admitting: *Deleted

## 2017-06-07 DIAGNOSIS — Z3A17 17 weeks gestation of pregnancy: Secondary | ICD-10-CM | POA: Diagnosis not present

## 2017-06-07 DIAGNOSIS — O99512 Diseases of the respiratory system complicating pregnancy, second trimester: Secondary | ICD-10-CM | POA: Diagnosis not present

## 2017-06-07 DIAGNOSIS — J302 Other seasonal allergic rhinitis: Secondary | ICD-10-CM | POA: Diagnosis not present

## 2017-06-07 DIAGNOSIS — O99342 Other mental disorders complicating pregnancy, second trimester: Secondary | ICD-10-CM | POA: Insufficient documentation

## 2017-06-07 DIAGNOSIS — F419 Anxiety disorder, unspecified: Secondary | ICD-10-CM | POA: Diagnosis not present

## 2017-06-07 DIAGNOSIS — Z87891 Personal history of nicotine dependence: Secondary | ICD-10-CM | POA: Diagnosis not present

## 2017-06-07 DIAGNOSIS — F329 Major depressive disorder, single episode, unspecified: Secondary | ICD-10-CM | POA: Diagnosis not present

## 2017-06-07 DIAGNOSIS — J45901 Unspecified asthma with (acute) exacerbation: Secondary | ICD-10-CM

## 2017-06-07 HISTORY — DX: Female pelvic inflammatory disease, unspecified: N73.9

## 2017-06-07 HISTORY — DX: Dermatitis, unspecified: L30.9

## 2017-06-07 HISTORY — DX: Anemia, unspecified: D64.9

## 2017-06-07 MED ORDER — CETIRIZINE HCL 10 MG PO TABS: 10.0000 mg | ORAL_TABLET | Freq: Every day | ORAL | 1 refills | Status: DC

## 2017-06-07 MED ORDER — ALBUTEROL SULFATE (2.5 MG/3ML) 0.083% IN NEBU
2.5000 mg | INHALATION_SOLUTION | Freq: Once | RESPIRATORY_TRACT | Status: AC
  Administered 2017-06-07: 2.5 mg via RESPIRATORY_TRACT
  Filled 2017-06-07: qty 3

## 2017-06-07 MED ORDER — IBUPROFEN 600 MG PO TABS
600.0000 mg | ORAL_TABLET | Freq: Once | ORAL | Status: AC
  Administered 2017-06-07: 600 mg via ORAL
  Filled 2017-06-07: qty 1

## 2017-06-07 MED ORDER — BECLOMETHASONE DIPROP HFA 80 MCG/ACT IN AERB
2.0000 | INHALATION_SPRAY | Freq: Two times a day (BID) | RESPIRATORY_TRACT | 3 refills | Status: DC
Start: 2017-06-07 — End: 2017-06-27

## 2017-06-07 MED ORDER — ALBUTEROL SULFATE HFA 108 (90 BASE) MCG/ACT IN AERS: 1.0000 | INHALATION_SPRAY | Freq: Four times a day (QID) | RESPIRATORY_TRACT | 2 refills | Status: DC | PRN

## 2017-06-07 NOTE — MAU Provider Note (Signed)
History     CSN: [id number]  Arrival date and time: 06/07/17 [id number]   First Provider Initiated Contact with Patient 06/07/17 0850      No chief complaint on file.  HPI   [name] is a 26 y.o. female [phone] @ 3610w3d with a history of asthma here in MAU with worsening of her asthma symptoms and worsening allergy symptoms. States she was diagnosed with asthma at age 89.  She used one aerosol can of Proventil in one month. She has not taken anything for her allergies during this pregnnacy. She has never been given an inhaler other than her rescue inhaler. Pregnancy makes her asthma symptoms worsen.   OB History    Gravida Para Term Preterm AB Living   5 2 2   2 2    SAB TAB Ectopic Multiple Live Births   2     0 2      Past Medical History:  Diagnosis Date  . Anemia   . Anxiety   . Asthma    uses inhaler, last used 8/22  . Depression   . Eczema   . History of chlamydia    12/2008; 03/09/2009  . History of gonorrhea    12/2008; 09/2010;   . 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 Ovarian cyst   . PID (pelvic inflammatory disease)   . Preterm labor     Past Surgical History:  Procedure Laterality Date  . NO PAST SURGERIES      Family History  Problem Relation Age of Onset  . Hypertension Mother   . Depression Mother   . Breast cancer Paternal Grandmother   . Cancer Paternal Grandmother        breast  . Diabetes Paternal Grandmother   . Asthma Maternal Grandmother   . Other Neg Hx     Social History  Substance Use Topics  . Smoking status: Former Smoker    Packs/day: 0.00    Years: 0.50    Types: Cigarettes    Quit date: 03/16/2017  . Smokeless tobacco: Never Used  . Alcohol use No     Comment: Rare    Allergies:  Allergies  Allergen Reactions  . Eggs Or Egg-Derived Products Hives and Swelling  . Latex Itching and Swelling    Prescriptions Prior to Admission  Medication Sig Dispense Refill Last Dose  . acetaminophen  (TYLENOL) 500 MG tablet Take 1,000 mg by mouth every 6 (six) hours as needed for moderate pain.    Not Taking  . albuterol (PROVENTIL HFA;VENTOLIN HFA) 108 (90 Base) MCG/ACT inhaler Inhale 1-2 puffs into the lungs every 6 (six) hours as needed for wheezing or shortness of breath. 1 Inhaler prn 05/23/2017 at Unknown time  . cetirizine (ZYRTEC) 10 MG chewable tablet Chew 10 mg by mouth daily.   Not Taking  . Doxylamine-Pyridoxine ER (BONJESTA) 20-20 MG TBCR Take 1 tablet by mouth 2 (two) times daily. 60 tablet 6 05/24/2017 at Unknown time  . famotidine (PEPCID) 20 MG tablet Take 1 tablet (20 mg total) by mouth 2 (two) times daily. 60 tablet 2   . omeprazole (PRILOSEC) 20 MG capsule Take 1 capsule (20 mg total) by mouth 2 (two) times daily before a meal. 60 capsule 5   . Prenat-FeAsp-Meth-FA-DHA w/o A (PRENATE PIXIE) 10-0.6-0.4-200 MG CAPS Take 1 tablet by mouth daily. 30 capsule 12   . Prenatal Vit-Fe Fumarate-FA (PRENATAL MULTIVITAMIN) TABS tablet Take 1 tablet by mouth daily at 12  noon.   Taking  . promethazine (PHENERGAN) 25 MG tablet Take 1 tablet (25 mg total) by mouth every 6 (six) hours as needed for nausea or vomiting. 20 tablet 0 Taking  . valACYclovir (VALTREX) 1000 MG tablet Take 1 tab po bid x 3 days prn each outbreak (Patient not taking: Reported on 05/01/2017) 30 tablet prn Not Taking   No results found for this or any previous visit (from the past 48 hour(s)).  Review of Systems  HENT: Positive for sinus pain, sinus pressure and sneezing. Negative for congestion.   Neurological: Positive for headaches.   Physical Exam   Blood pressure 107/69, pulse 90, temperature 97.6 F (36.4 C), temperature source Oral, resp. rate 18, weight 186 lb 12 oz (84.7 kg), last menstrual period 12/04/2016, SpO2 99 %, not currently breastfeeding.  Physical Exam  Constitutional: She is oriented to person, place, and time. She appears well-developed and well-nourished.  Non-toxic appearance. She does not have  a sickly appearance. She does not appear ill. No distress.  HENT:  Head: Normocephalic.  Respiratory: Effort normal and breath sounds normal. She has no decreased breath sounds. She has no wheezes. She has no rhonchi. She has no rales.  Musculoskeletal: Normal range of motion.  Neurological: She is alert and oriented to person, place, and time.  Skin: Skin is warm. She is not diaphoretic.  Psychiatric: Her behavior is normal.    MAU Course  Procedures  None  MDM  + fetal heart tones via doppler.  Breathing treatment as requested by the patient.  Ibuprofen given 600 mg PO; HA/sinus pain down from 7/10-0/10.   Assessment and Plan   A:  1. Mild asthma with exacerbation, unspecified whether persistent   2. Seasonal allergic rhinitis, unspecified trigger     P:  Discharge home in stable condition Rx: Qvar, Zytrec  Follow up with PCP Return to MAU if symptoms worsen Discussed the importance of using the QVAR as directed and as needed. Continue Proventil as needed only.  Duane Lope, NP 06/07/2017 12:17 PM

## 2017-06-07 NOTE — Discharge Instructions (Signed)
Allergies An allergy is when your body reacts to a substance in a way that is not normal. An allergic reaction can happen after you:  Eat something.  Breathe in something.  Touch something.  You can be allergic to:  Things that are only around during certain seasons, like molds and pollens.  Foods.  Drugs.  Insects.  Animal dander.  What are the signs or symptoms?  Puffiness (swelling). This may happen on the lips, face, tongue, mouth, or throat.  Sneezing.  Coughing.  Breathing loudly (wheezing).  Stuffy nose.  Tingling in the mouth.  A rash.  Itching.  Itchy, red, puffy areas of skin (hives).  Watery eyes.  Throwing up (vomiting).  Watery poop (diarrhea).  Dizziness.  Feeling faint or fainting.  Trouble breathing or swallowing.  A tight feeling in the chest.  A fast heartbeat. How is this diagnosed? Allergies can be diagnosed with:  A medical and family history.  Skin tests.  Blood tests.  A food diary. A food diary is a record of all the foods, drinks, and symptoms you have each day.  The results of an elimination diet. This diet involves making sure not to eat certain foods and then seeing what happens when you start eating them again.  How is this treated? There is no cure for allergies, but allergic reactions can be treated with medicine. Severe reactions usually need to be treated at a hospital. How is this prevented? The best way to prevent an allergic reaction is to avoid the thing you are allergic to. Allergy shots and medicines can also help prevent reactions in some cases. This information is not intended to replace advice given to you by your health care provider. Make sure you discuss any questions you have with your health care provider. Document Released: 03/29/2013 Document Revised: 07/29/2016 Document Reviewed: 09/13/2014 Elsevier Interactive Patient Education  2018 [employer] Asthma, Adult Asthma is a condition of the  lungs in which the airways tighten and narrow. Asthma can make it hard to breathe. Asthma cannot be cured, but medicine and lifestyle changes can help control it. Asthma may be started (triggered) by:  Animal skin flakes (dander).  Dust.  Cockroaches.  Pollen.  Mold.  Smoke.  Cleaning products.  Hair sprays or aerosol sprays.  Paint fumes or strong smells.  Cold air, weather changes, and winds.  Crying or laughing hard.  Stress.  Certain medicines or drugs.  Foods, such as dried fruit, potato chips, and sparkling grape juice.  Infections or conditions (colds, flu).  Exercise.  Certain medical conditions or diseases.  Exercise or tiring activities.  Follow these instructions at home:  Take medicine as told by your doctor.  Use a peak flow meter as told by your doctor. A peak flow meter is a tool that measures how well the lungs are working.  Record and keep track of the peak flow meter's readings.  Understand and use the asthma action plan. An asthma action plan is a written plan for taking care of your asthma and treating your attacks.  To help prevent asthma attacks: ? Do not smoke. Stay away from secondhand smoke. ? Change your heating and air conditioning filter often. ? Limit your use of fireplaces and wood stoves. ? Get rid of pests (such as roaches and mice) and their droppings. ? Throw away plants if you see mold on them. ? Clean your floors. Dust regularly. Use cleaning products that do not smell. ? Have someone vacuum when you   you are not home. Use a vacuum cleaner with a HEPA filter if possible. ? Replace carpet with wood, tile, or vinyl flooring. Carpet can trap animal skin flakes and dust. ? Use allergy-proof pillows, mattress covers, and box spring covers. ? Wash bed sheets and blankets every week in hot water and dry them in a dryer. ? Use blankets that are made of polyester or cotton. ? Clean bathrooms and kitchens with bleach. If possible,  have someone repaint the walls in these rooms with mold-resistant paint. Keep out of the rooms that are being cleaned and painted. ? Wash hands often. Contact a doctor if:  You have make a whistling sound when breaking (wheeze), have shortness of breath, or have a cough even if taking medicine to prevent attacks.  The colored mucus you cough up (sputum) is thicker than usual.  The colored mucus you cough up changes from clear or white to yellow, green, gray, or bloody.  You have problems from the medicine you are taking such as: ? A rash. ? Itching. ? Swelling. ? Trouble breathing.  You need reliever medicines more than 2-3 times a week.  Your peak flow measurement is still at 50-79% of your personal best after following the action plan for 1 hour.  You have a fever. Get help right away if:  You seem to be worse and are not responding to medicine during an asthma attack.  You are short of breath even at rest.  You get short of breath when doing very little activity.  You have trouble eating, drinking, or talking.  You have chest pain.  You have a fast heartbeat.  Your lips or fingernails start to turn blue.  You are light-headed, dizzy, or faint.  Your peak flow is less than 50% of your personal best. This information is not intended to replace advice given to you by your health care provider. Make sure you discuss any questions you have with your health care provider. Document Released: 05/20/2008 Document Revised: 05/09/2016 Document Reviewed: 07/01/2013 Elsevier Interactive Patient Education  2017 [employer]

## 2017-06-07 NOTE — MAU Note (Signed)
Basically it's her asthma.  Woke up like 4 times during the night, had to use inhaler. ? Allergies,  Runny nose. Wants to know what is safe to take.  Wheezing, little cough, non-productive, denies fever.  Headache last few days- frontal/sinus/temples

## 2017-06-12 ENCOUNTER — Telehealth: Payer: Self-pay | Admitting: *Deleted

## 2017-06-12 NOTE — Telephone Encounter (Signed)
Fax received from pharmacy regarding Qvar Rx. Qvar Redihaler is not covered by ins. Flovent FHA or Pulmicort Respules are covered by her current plan.   Please change Rx and send to pharmacy.

## 2017-06-13 ENCOUNTER — Other Ambulatory Visit: Payer: Self-pay | Admitting: Certified Nurse Midwife

## 2017-06-13 DIAGNOSIS — J45909 Unspecified asthma, uncomplicated: Secondary | ICD-10-CM

## 2017-06-13 DIAGNOSIS — O99519 Diseases of the respiratory system complicating pregnancy, unspecified trimester: Principal | ICD-10-CM

## 2017-06-13 MED ORDER — FLUTICASONE PROPIONATE HFA 110 MCG/ACT IN AERO: 2.0000 | INHALATION_SPRAY | Freq: Every day | RESPIRATORY_TRACT | 12 refills | Status: DC

## 2017-06-16 ENCOUNTER — Ambulatory Visit (HOSPITAL_COMMUNITY): Payer: Medicaid Other

## 2017-06-20 ENCOUNTER — Ambulatory Visit (HOSPITAL_COMMUNITY)
Admission: RE | Admit: 2017-06-20 | Discharge: 2017-06-20 | Disposition: A | Discharge status: Home / Self Care | Payer: Medicaid Other | Source: Ambulatory Visit | Attending: Certified Nurse Midwife | Admitting: Certified Nurse Midwife

## 2017-06-20 ENCOUNTER — Other Ambulatory Visit: Payer: Self-pay | Admitting: Certified Nurse Midwife

## 2017-06-20 DIAGNOSIS — Z3480 Encounter for supervision of other normal pregnancy, unspecified trimester: Secondary | ICD-10-CM

## 2017-06-20 DIAGNOSIS — Z3A19 19 weeks gestation of pregnancy: Secondary | ICD-10-CM | POA: Diagnosis not present

## 2017-06-20 DIAGNOSIS — Z363 Encounter for antenatal screening for malformations: Secondary | ICD-10-CM

## 2017-06-20 DIAGNOSIS — Z3482 Encounter for supervision of other normal pregnancy, second trimester: Secondary | ICD-10-CM | POA: Insufficient documentation

## 2017-06-23 ENCOUNTER — Other Ambulatory Visit: Payer: Self-pay | Admitting: Certified Nurse Midwife

## 2017-06-23 DIAGNOSIS — Z3480 Encounter for supervision of other normal pregnancy, unspecified trimester: Secondary | ICD-10-CM

## 2017-06-26 ENCOUNTER — Encounter: Payer: Medicaid Other | Admitting: Certified Nurse Midwife

## 2017-06-27 ENCOUNTER — Ambulatory Visit (INDEPENDENT_AMBULATORY_CARE_PROVIDER_SITE_OTHER): Payer: Medicaid Other | Admitting: Certified Nurse Midwife

## 2017-06-27 ENCOUNTER — Encounter: Payer: Self-pay | Admitting: Certified Nurse Midwife

## 2017-06-27 ENCOUNTER — Other Ambulatory Visit: Payer: Self-pay | Admitting: Certified Nurse Midwife

## 2017-06-27 VITALS — BP 120/75 | HR 96 | Wt 192.0 lb

## 2017-06-27 DIAGNOSIS — F419 Anxiety disorder, unspecified: Secondary | ICD-10-CM

## 2017-06-27 DIAGNOSIS — Z3483 Encounter for supervision of other normal pregnancy, third trimester: Secondary | ICD-10-CM

## 2017-06-27 DIAGNOSIS — O09899 Supervision of other high risk pregnancies, unspecified trimester: Secondary | ICD-10-CM

## 2017-06-27 DIAGNOSIS — O26893 Other specified pregnancy related conditions, third trimester: Secondary | ICD-10-CM

## 2017-06-27 DIAGNOSIS — O26899 Other specified pregnancy related conditions, unspecified trimester: Secondary | ICD-10-CM

## 2017-06-27 DIAGNOSIS — A609 Anogenital herpesviral infection, unspecified: Secondary | ICD-10-CM

## 2017-06-27 DIAGNOSIS — Z283 Underimmunization status: Secondary | ICD-10-CM

## 2017-06-27 DIAGNOSIS — Z3480 Encounter for supervision of other normal pregnancy, unspecified trimester: Secondary | ICD-10-CM

## 2017-06-27 DIAGNOSIS — R102 Pelvic and perineal pain: Secondary | ICD-10-CM

## 2017-06-27 DIAGNOSIS — Z2839 Other underimmunization status: Secondary | ICD-10-CM

## 2017-06-27 DIAGNOSIS — O09893 Supervision of other high risk pregnancies, third trimester: Secondary | ICD-10-CM

## 2017-06-27 DIAGNOSIS — B009 Herpesviral infection, unspecified: Secondary | ICD-10-CM

## 2017-06-27 LAB — POCT URINALYSIS DIPSTICK
BILIRUBIN UA: NEGATIVE
Blood, UA: NEGATIVE
GLUCOSE UA: NEGATIVE
LEUKOCYTES UA: NEGATIVE
Nitrite, UA: NEGATIVE
PH UA: 6.5 (ref 5.0–8.0)
Protein, UA: NEGATIVE
Spec Grav, UA: 1.015 (ref 1.010–1.025)
Urobilinogen, UA: NEGATIVE E.U./dL — AB

## 2017-06-27 MED ORDER — OB COMPLETE PETITE 35-5-1-200 MG PO CAPS: 1.0000 | ORAL_CAPSULE | Freq: Every day | ORAL | 12 refills | Status: DC

## 2017-06-27 MED ORDER — HYDROXYZINE PAMOATE 50 MG PO CAPS: 50.0000 mg | ORAL_CAPSULE | Freq: Three times a day (TID) | ORAL | 4 refills | Status: DC | PRN

## 2017-06-27 MED ORDER — COMFORT FIT MATERNITY SUPP LG MISC: 1.0000 [IU] | Freq: Every day | 0 refills | Status: DC

## 2017-06-27 NOTE — Progress Notes (Signed)
Patient reports she has had more pressure recently. Patient may have UTI- she complains of odor.

## 2017-06-27 NOTE — Progress Notes (Signed)
   PRENATAL VISIT NOTE  Subjective:  Lauren Conway is a 26 y.o. [id number] at 5755w2d being seen today for ongoing prenatal care.  She is currently monitored for the following issues for this low-risk pregnancy and has Chlamydia trachomatis infection; Supervision of other normal pregnancy, antepartum; HSV (herpes simplex virus) anogenital infection; Maternal varicella, non-immune; Substance abuse affecting pregnancy, antepartum; Pain of round ligament affecting pregnancy, antepartum; and Anxiety on her problem list.  Patient reports no bleeding, no contractions, no cramping, no leaking and anxiety over life stressors.  Contractions: Not present. Vag. Bleeding: None.  Movement: Present. Denies leaking of fluid.   The following portions of the patient's history were reviewed and updated as appropriate: allergies, current medications, past family history, past medical history, past social history, past surgical history and problem list. Problem list updated.  Objective:   Vitals:   06/27/17 0954  BP: 120/75  Pulse: 96  Weight: 192 lb (87.1 kg)    Fetal Status: Fetal Heart Rate (bpm): 158 Fundal Height: 20 cm Movement: Present     General:  Alert, oriented and cooperative. Patient is in no acute distress.  Skin: Skin is warm and dry. No rash noted.   Cardiovascular: Normal heart rate noted  Respiratory: Normal respiratory effort, no problems with respiration noted  Abdomen: Soft, gravid, appropriate for gestational age. Pain/Pressure: Present     Pelvic:  Cervical exam deferred        Extremities: Normal range of motion.  Edema: None  Mental Status: Normal mood and affect. Normal behavior. Normal judgment and thought content.   Assessment and Plan:  Pregnancy: [id number] at 6555w2d  1. Supervision of other normal pregnancy, antepartum     - Prenat-FeCbn-FeAspGl-FA-Omega (OB COMPLETE PETITE) 35-5-1-200 MG CAPS; Take 1 tablet by mouth daily.  Dispense: 30 capsule; Refill: 12 - POCT urinalysis  dipstick  2. Pain of round ligament affecting pregnancy, antepartum     - Elastic Bandages & Supports (COMFORT FIT MATERNITY SUPP LG) MISC; 1 Units by Does not apply route daily.  Dispense: 1 each; Refill: 0  3. HSV (herpes simplex virus) anogenital infection     Valtrex @36  weeks  4. Maternal varicella, non-immune     Varicella postpartum  5. Anxiety       - hydrOXYzine (VISTARIL) 50 MG capsule; Take 1 capsule (50 mg total) by mouth 3 (three) times daily as needed.  Dispense: 90 capsule; Refill: 4 - Ambulatory referral to [facility]  Preterm labor symptoms and general obstetric precautions including but not limited to vaginal bleeding, contractions, leaking of fluid and fetal movement were reviewed in detail with the patient. Please refer to After Visit Summary for other counseling recommendations.  Return in about 4 weeks (around 07/25/2017) for ROB.   Roe Coombsachelle A Iyari Hagner, CNM

## 2017-07-03 ENCOUNTER — Telehealth: Payer: Self-pay | Admitting: *Deleted

## 2017-07-03 NOTE — Telephone Encounter (Signed)
Patient is concerned that her UA at her last visit had a trace of ketones and she wants to know the significance and does she need to be tested again?

## 2017-07-04 ENCOUNTER — Encounter: Payer: Self-pay | Admitting: *Deleted

## 2017-07-04 NOTE — Telephone Encounter (Signed)
Patient notified in MyChart.

## 2017-07-04 NOTE — Telephone Encounter (Signed)
No significance, normal in pregnancy, no retest indicated.  Thank you. Lauren Conway

## 2017-07-05 ENCOUNTER — Encounter (HOSPITAL_COMMUNITY): Payer: Self-pay

## 2017-07-05 ENCOUNTER — Inpatient Hospital Stay (HOSPITAL_COMMUNITY)
Admission: AD | Admit: 2017-07-05 | Discharge: 2017-07-05 | Disposition: A | Discharge status: Home / Self Care | Payer: Medicaid Other | Source: Ambulatory Visit | Attending: Obstetrics & Gynecology | Admitting: Obstetrics & Gynecology

## 2017-07-05 DIAGNOSIS — Z3A21 21 weeks gestation of pregnancy: Secondary | ICD-10-CM | POA: Diagnosis not present

## 2017-07-05 DIAGNOSIS — O9989 Other specified diseases and conditions complicating pregnancy, childbirth and the puerperium: Secondary | ICD-10-CM

## 2017-07-05 DIAGNOSIS — Z87891 Personal history of nicotine dependence: Secondary | ICD-10-CM | POA: Diagnosis not present

## 2017-07-05 DIAGNOSIS — O26892 Other specified pregnancy related conditions, second trimester: Secondary | ICD-10-CM | POA: Diagnosis not present

## 2017-07-05 DIAGNOSIS — O26899 Other specified pregnancy related conditions, unspecified trimester: Secondary | ICD-10-CM

## 2017-07-05 DIAGNOSIS — R109 Unspecified abdominal pain: Secondary | ICD-10-CM | POA: Diagnosis not present

## 2017-07-05 LAB — WET PREP, GENITAL
CLUE CELLS WET PREP: NONE SEEN
Sperm: NONE SEEN
TRICH WET PREP: NONE SEEN
YEAST WET PREP: NONE SEEN

## 2017-07-05 LAB — URINALYSIS, ROUTINE W REFLEX MICROSCOPIC
Bilirubin Urine: NEGATIVE
GLUCOSE, UA: NEGATIVE mg/dL
Hgb urine dipstick: NEGATIVE
Ketones, ur: NEGATIVE mg/dL
LEUKOCYTES UA: NEGATIVE
NITRITE: NEGATIVE
PROTEIN: NEGATIVE mg/dL
Specific Gravity, Urine: 1.013 (ref 1.005–1.030)
pH: 6 (ref 5.0–8.0)

## 2017-07-05 MED ORDER — SERTRALINE HCL 25 MG PO TABS: 50.0000 mg | ORAL_TABLET | Freq: Every day | ORAL | 0 refills | Status: DC

## 2017-07-05 NOTE — MAU Provider Note (Signed)
History     CSN: [id number]  Arrival date and time: 07/05/17 7829   First Provider Initiated Contact with Patient 07/05/17 0522    Chief Complaint  Patient presents with  . Flank Pain  . Decreased Fetal Movement   HPI Lauren Conway is a 26 y.o. [id number] at [redacted]w[redacted]d who presents with intermittent right flank pain since 0230 this morning. She states the pain is a 6/10 when it comes and has not tried anything for the pain. She currently has no pain upon arrival. She is also concerned because she felt less fetal movement when the pain started but is feeling frequent movement now. She states she is feeling more depressed at home due to her social and family situations. She is concerned because she feels like her urine has a strong odor. Denies any abdominal pain or vaginal bleeding or leaking. Requesting to be tested for vaginal infections.  OB History    Gravida Para Term Preterm AB Living   5 2 2   2 2    SAB TAB Ectopic Multiple Live Births   2     0 2      Past Medical History:  Diagnosis Date  . Anemia   . Anxiety   . Asthma    uses inhaler, last used 8/22  . Depression   . Eczema   . History of chlamydia    12/2008; 03/09/2009  . History of gonorrhea    12/2008; 09/2010;   . 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 Ovarian cyst   . PID (pelvic inflammatory disease)   . Preterm labor     Past Surgical History:  Procedure Laterality Date  . NO PAST SURGERIES      Family History  Problem Relation Age of Onset  . Hypertension Mother   . Depression Mother   . Breast cancer Paternal Grandmother   . Cancer Paternal Grandmother        breast  . Diabetes Paternal Grandmother   . Asthma Maternal Grandmother   . Other Neg Hx     Social History  Substance Use Topics  . Smoking status: Former Smoker    Packs/day: 0.00    Years: 0.50    Types: Cigarettes    Quit date: 03/16/2017  . Smokeless tobacco: Never Used  . Alcohol use No     Comment: Rare    Allergies:  Allergies  Allergen Reactions  . Eggs Or Egg-Derived Products Hives and Swelling  . Latex Itching and Swelling   Prescriptions Prior to Admission  Medication Sig Dispense Refill Last Dose  . albuterol (PROVENTIL HFA;VENTOLIN HFA) 108 (90 Base) MCG/ACT inhaler Inhale 1-2 puffs into the lungs every 6 (six) hours as needed for wheezing or shortness of breath. 1 Inhaler 2 07/04/2017 at Unknown time  . cetirizine (ZYRTEC ALLERGY) 10 MG tablet Take 1 tablet (10 mg total) by mouth daily. 60 tablet 1 Past Week at Unknown time  . Doxylamine-Pyridoxine ER (BONJESTA) 20-20 MG TBCR Take 1 tablet by mouth 2 (two) times daily. 60 tablet 6 Past Week at Unknown time  . Elastic Bandages & Supports (COMFORT FIT MATERNITY SUPP LG) MISC 1 Units by Does not apply route daily. 1 each 0 07/04/2017 at Unknown time  . famotidine (PEPCID) 20 MG tablet Take 1 tablet (20 mg total) by mouth 2 (two) times daily. 60 tablet 2 07/04/2017 at Unknown time  . fluticasone (FLOVENT HFA) 110 MCG/ACT inhaler Inhale 2 puffs into the lungs  once.   07/04/2017 at Unknown time  . hydrOXYzine (VISTARIL) 50 MG capsule Take 1 capsule (50 mg total) by mouth 3 (three) times daily as needed. 90 capsule 4 Past Week at Unknown time  . Prenat-FeCbn-FeAspGl-FA-Omega (OB COMPLETE PETITE) 35-5-1-200 MG CAPS Take 1 tablet by mouth daily. 30 capsule 12 Past Week at Unknown time  . acetaminophen (TYLENOL) 500 MG tablet Take 1,000 mg by mouth every 6 (six) hours as needed for moderate pain.    Taking  . valACYclovir (VALTREX) 1000 MG tablet Take 1 tab po bid x 3 days prn each outbreak (Patient not taking: Reported on 05/01/2017) 30 tablet prn Not Taking  . valACYclovir (VALTREX) 500 MG tablet TAKE 1 TABLET BY MOUTH EVERY DAY TAKE TWICE A DAY FOR 3 DAYS FOR FLARE 36 tablet 6    Review of Systems  Constitutional: Negative.  Negative for chills and fever.  HENT: Negative.   Respiratory: Negative.  Negative for shortness of breath.    Cardiovascular: Negative.  Negative for chest pain.  Gastrointestinal: Negative for abdominal pain, constipation, diarrhea, nausea and vomiting.  Genitourinary: Positive for flank pain. Negative for dysuria, frequency, urgency, vaginal bleeding and vaginal discharge.  Neurological: Negative.  Negative for dizziness and headaches.  Psychiatric/Behavioral: Negative.    Physical Exam   Blood pressure 114/66, pulse 92, temperature 98.4 F (36.9 C), temperature source Oral, resp. rate 16, height 5\' 3"  (1.6 m), weight 192 lb (87.1 kg), last menstrual period 12/04/2016, SpO2 99 %, not currently breastfeeding.  Physical Exam  Nursing note and vitals reviewed. Constitutional: She appears well-developed and well-nourished.  HENT:  Head: Normocephalic and atraumatic.  Eyes: Conjunctivae are normal. No scleral icterus.  Cardiovascular: Normal rate, regular rhythm and normal heart sounds.   Respiratory: Effort normal and breath sounds normal. No respiratory distress.  GI: Soft. She exhibits no distension. There is no tenderness. There is no guarding and no CVA tenderness.  Neurological: She is alert.  Skin: Skin is warm and dry.  Psychiatric: She has a normal mood and affect. Her behavior is normal. Judgment and thought content normal.   Patient refused speculum exam Bimanual exam: Cervix 0/long/high, firm, anterior, neg CMT, uterus nontender, adnexa without tenderness, enlargement, or mass  FHT: 130 MAU Course  Procedures Results for orders placed or performed during the hospital encounter of 07/05/17 (from the past 24 hour(s))  Urinalysis, Routine w reflex microscopic     Status: Abnormal   Collection Time: 07/05/17  4:40 AM  Result Value Ref Range   Color, Urine YELLOW YELLOW   APPearance HAZY (A) CLEAR   Specific Gravity, Urine 1.013 1.005 - 1.030   pH 6.0 5.0 - 8.0   Glucose, UA NEGATIVE NEGATIVE mg/dL   Hgb urine dipstick NEGATIVE NEGATIVE   Bilirubin Urine NEGATIVE NEGATIVE    Ketones, ur NEGATIVE NEGATIVE mg/dL   Protein, ur NEGATIVE NEGATIVE mg/dL   Nitrite NEGATIVE NEGATIVE   Leukocytes, UA NEGATIVE NEGATIVE  Wet prep, genital     Status: Abnormal   Collection Time: 07/05/17  5:40 AM  Result Value Ref Range   Yeast Wet Prep HPF POC NONE SEEN NONE SEEN   Trich, Wet Prep NONE SEEN NONE SEEN   Clue Cells Wet Prep HPF POC NONE SEEN NONE SEEN   WBC, Wet Prep HPF POC FEW (A) NONE SEEN   Sperm NONE SEEN    MDM UA Wet prep and gc/chlamydia Patient declines pain medication in MAU Assessment and Plan   1. Pregnancy with flank  pain, antepartum   2. [redacted] weeks gestation of pregnancy    -Discharge patient home in stable condition. -Prescription for zoloft sent to patient's pharmacy -Encouraged patient to increase fluid intake daily -Follow up at [facility] as [facility] for prenatal [facility] -Encouraged to return here or to other Urgent Care/ED if she develops worsening of symptoms, increase in pain, fever, or other concerning symptoms.   Cleone SlimCaroline Neill SNM 07/05/2017, 6:09 AM   I confirm that I have verified the information documented in the nurse midwife student's note and that I have also personally reperformed the physical exam and all medical decision making activities.   Thressa ShellerHeather Mercedes Fort 6:20 AM 07/05/17

## 2017-07-05 NOTE — Discharge Instructions (Signed)
Second Trimester of Pregnancy The second trimester is from week 14 through week 27 (months 4 through 6). The second trimester is often a time when you feel your best. Your body has adjusted to being pregnant, and you begin to feel better physically. Usually, morning sickness has lessened or quit completely, you may have more energy, and you may have an increase in appetite. The second trimester is also a time when the fetus is growing rapidly. At the end of the sixth month, the fetus is about 9 inches long and weighs about 1 pounds. You will likely begin to feel the baby move (quickening) between 16 and 20 weeks of pregnancy. Body changes during your second trimester Your body continues to go through many changes during your second trimester. The changes vary from woman to woman.  Your weight will continue to increase. You will notice your lower abdomen bulging out.  You may begin to get stretch marks on your hips, abdomen, and breasts.  You may develop headaches that can be relieved by medicines. The medicines should be approved by your health care provider.  You may urinate more often because the fetus is pressing on your bladder.  You may develop or continue to have heartburn as a result of your pregnancy.  You may develop constipation because certain hormones are causing the muscles that push waste through your intestines to slow down.  You may develop hemorrhoids or swollen, bulging veins (varicose veins).  You may have back pain. This is caused by: ? Weight gain. ? Pregnancy hormones that are relaxing the joints in your pelvis. ? A shift in weight and the muscles that support your balance.  Your breasts will continue to grow and they will continue to become tender.  Your gums may bleed and may be sensitive to brushing and flossing.  Dark spots or blotches (chloasma, mask of pregnancy) may develop on your face. This will likely fade after the baby is born.  A dark line from your  belly button to the pubic area (linea nigra) may appear. This will likely fade after the baby is born.  You may have changes in your hair. These can include thickening of your hair, rapid growth, and changes in texture. Some women also have hair loss during or after pregnancy, or hair that feels dry or thin. Your hair will most likely return to normal after your baby is born.  What to expect at prenatal visits During a routine prenatal visit:  You will be weighed to make sure you and the fetus are growing normally.  Your blood pressure will be taken.  Your abdomen will be measured to track your baby's growth.  The fetal heartbeat will be listened to.  Any test results from the previous visit will be discussed.  Your health care provider may ask you:  How you are feeling.  If you are feeling the baby move.  If you have had any abnormal symptoms, such as leaking fluid, bleeding, severe headaches, or abdominal cramping.  If you are using any tobacco products, including cigarettes, chewing tobacco, and electronic cigarettes.  If you have any questions.  Other tests that may be performed during your second trimester include:  Blood tests that check for: ? Low iron levels (anemia). ? High blood sugar that affects pregnant women (gestational diabetes) between 13 and 28 weeks. ? Rh antibodies. This is to check for a protein on red blood cells (Rh factor).  Urine tests to check for infections, diabetes, or  protein in the urine.  An ultrasound to confirm the proper growth and development of the baby.  An amniocentesis to check for possible genetic problems.  Fetal screens for spina bifida and Down syndrome.  HIV (human immunodeficiency virus) testing. Routine prenatal testing includes screening for HIV, unless you choose not to have this test.  Follow these instructions at home: Medicines  Follow your health care provider's instructions regarding medicine use. Specific  medicines may be either safe or unsafe to take during pregnancy.  Take a prenatal vitamin that contains at least 600 micrograms (mcg) of folic acid.  If you develop constipation, try taking a stool softener if your health care provider approves. Eating and drinking  Eat a balanced diet that includes fresh fruits and vegetables, whole grains, good sources of protein such as meat, eggs, or tofu, and low-fat dairy. Your health care provider will help you determine the amount of weight gain that is right for you.  Avoid raw meat and uncooked cheese. These carry germs that can cause birth defects in the baby.  If you have low calcium intake from food, talk to your health care provider about whether you should take a daily calcium supplement.  Limit foods that are high in fat and processed sugars, such as fried and sweet foods.  To prevent constipation: ? Drink enough fluid to keep your urine clear or pale yellow. ? Eat foods that are high in fiber, such as fresh fruits and vegetables, whole grains, and beans. Activity  Exercise only as directed by your health care provider. Most women can continue their usual exercise routine during pregnancy. Try to exercise for 30 minutes at least 5 days a week. Stop exercising if you experience uterine contractions.  Avoid heavy lifting, wear low heel shoes, and practice good posture.  A sexual relationship may be continued unless your health care provider directs you otherwise. Relieving pain and discomfort  Wear a good support bra to prevent discomfort from breast tenderness.  Take warm sitz baths to soothe any pain or discomfort caused by hemorrhoids. Use hemorrhoid cream if your health care provider approves.  Rest with your legs elevated if you have leg cramps or low back pain.  If you develop varicose veins, wear support hose. Elevate your feet for 15 minutes, 3-4 times a day. Limit salt in your diet. Prenatal Care  Write down your questions.  Take them to your prenatal visits.  Keep all your prenatal visits as told by your health care provider. This is important. Safety  Wear your seat belt at all times when driving.  Make a list of emergency phone numbers, including numbers for family, friends, the hospital, and police and fire departments. General instructions  Ask your health care provider for a referral to a local prenatal education class. Begin classes no later than the beginning of month 6 of your pregnancy.  Ask for help if you have counseling or nutritional needs during pregnancy. Your health care provider can offer advice or refer you to specialists for help with various needs.  Do not use hot tubs, steam rooms, or saunas.  Do not douche or use tampons or scented sanitary pads.  Do not cross your legs for long periods of time.  Avoid cat litter boxes and soil used by cats. These carry germs that can cause birth defects in the baby and possibly loss of the fetus by miscarriage or stillbirth.  Avoid all smoking, herbs, alcohol, and unprescribed drugs. Chemicals in these products can  affect the formation and growth of the baby.  Do not use any products that contain nicotine or tobacco, such as cigarettes and e-cigarettes. If you need help quitting, ask your health care provider.  Visit your dentist if you have not gone yet during your pregnancy. Use a soft toothbrush to brush your teeth and be gentle when you floss. Contact a health care provider if:  You have dizziness.  You have mild pelvic cramps, pelvic pressure, or nagging pain in the abdominal area.  You have persistent nausea, vomiting, or diarrhea.  You have a bad smelling vaginal discharge.  You have pain when you urinate. Get help right away if:  You have a fever.  You are leaking fluid from your vagina.  You have spotting or bleeding from your vagina.  You have severe abdominal cramping or pain.  You have rapid weight gain or weight  loss.  You have shortness of breath with chest pain.  You notice sudden or extreme swelling of your face, hands, ankles, feet, or legs.  You have not felt your baby move in over an hour.  You have severe headaches that do not go away when you take medicine.  You have vision changes. Summary  The second trimester is from week 14 through week 27 (months 4 through 6). It is also a time when the fetus is growing rapidly.  Your body goes through many changes during pregnancy. The changes vary from woman to woman.  Avoid all smoking, herbs, alcohol, and unprescribed drugs. These chemicals affect the formation and growth your baby.  Do not use any tobacco products, such as cigarettes, chewing tobacco, and e-cigarettes. If you need help quitting, ask your health care provider.  Contact your health care provider if you have any questions. Keep all prenatal visits as told by your health care provider. This is important. This information is not intended to replace advice given to you by your health care provider. Make sure you discuss any questions you have with your health care provider. Document Released: 11/26/2001 Document Revised: 05/09/2016 Document Reviewed: 02/02/2013 Elsevier Interactive Patient Education  2017 [employer] Abdominal Pain During Pregnancy Abdominal pain is common in pregnancy. Most of the time, it does not cause harm. There are many causes of abdominal pain. Some causes are more serious than others and sometimes the cause is not known. Abdominal pain can be a sign that something is very wrong with the pregnancy or the pain may have nothing to do with the pregnancy. Always tell your health care provider if you have any abdominal pain. Follow these instructions at home:  Do not have sex or put anything in your vagina until your symptoms go away completely.  Watch your abdominal pain for any changes.  Get plenty of rest until your pain improves.  Drink enough fluid to  keep your urine clear or pale yellow.  Take over-the-counter or prescription medicines only as told by your health care provider.  Keep all follow-up visits as told by your health care provider. This is important. Contact a health care provider if:  You have a fever.  Your pain gets worse or you have cramping.  Your pain continues after resting. Get help right away if:  You are bleeding, leaking fluid, or passing tissue from the vagina.  You have vomiting or diarrhea that does not go away.  You have painful or bloody urination.  You notice a decrease in your baby's movements.  You feel very weak or faint.  You have shortness of breath.  You develop a severe headache with abdominal pain.  You have abnormal vaginal discharge with abdominal pain. This information is not intended to replace advice given to you by your health care provider. Make sure you discuss any questions you have with your health care provider. Document Released: 12/02/2005 Document Revised: 09/12/2016 Document Reviewed: 07/01/2013 Elsevier Interactive Patient Education  [employer].

## 2017-07-05 NOTE — MAU Note (Signed)
Sharp right flank pain since 0230 comes and goes. No leaking. Mucus discharge. No bleeding. Baby moving less than normal since 11pm.

## 2017-07-07 LAB — GC/CHLAMYDIA PROBE AMP (~~LOC~~) NOT AT ARMC
CHLAMYDIA, DNA PROBE: NEGATIVE
NEISSERIA GONORRHEA: NEGATIVE

## 2017-07-11 ENCOUNTER — Encounter: Payer: Self-pay | Admitting: *Deleted

## 2017-07-15 ENCOUNTER — Inpatient Hospital Stay (HOSPITAL_COMMUNITY)
Admission: EM | Admit: 2017-07-15 | Discharge: 2017-07-16 | Disposition: A | Discharge status: Home / Self Care | Payer: No Typology Code available for payment source | Attending: Family Medicine | Admitting: Family Medicine

## 2017-07-15 ENCOUNTER — Encounter (HOSPITAL_COMMUNITY): Payer: Self-pay | Admitting: Emergency Medicine

## 2017-07-15 DIAGNOSIS — Y92414 Local residential or business street as the place of occurrence of the external cause: Secondary | ICD-10-CM | POA: Insufficient documentation

## 2017-07-15 DIAGNOSIS — O26893 Other specified pregnancy related conditions, third trimester: Secondary | ICD-10-CM | POA: Insufficient documentation

## 2017-07-15 DIAGNOSIS — Z91040 Latex allergy status: Secondary | ICD-10-CM | POA: Insufficient documentation

## 2017-07-15 DIAGNOSIS — Z87891 Personal history of nicotine dependence: Secondary | ICD-10-CM | POA: Diagnosis not present

## 2017-07-15 DIAGNOSIS — Z3A23 23 weeks gestation of pregnancy: Secondary | ICD-10-CM | POA: Diagnosis not present

## 2017-07-15 MED ORDER — CYCLOBENZAPRINE HCL 10 MG PO TABS
10.0000 mg | ORAL_TABLET | Freq: Once | ORAL | Status: AC
Start: 2017-07-15 — End: 2017-07-15
  Administered 2017-07-15: 10 mg via ORAL
  Filled 2017-07-15: qty 1

## 2017-07-15 MED ORDER — ACETAMINOPHEN 500 MG PO TABS
1000.0000 mg | ORAL_TABLET | Freq: Once | ORAL | Status: AC
  Administered 2017-07-15: 1000 mg via ORAL
  Filled 2017-07-15: qty 2

## 2017-07-15 NOTE — ED Notes (Signed)
OB nurse at the bedside. 

## 2017-07-15 NOTE — ED Notes (Signed)
OB RR at bedside 
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
## 2017-07-15 NOTE — ED Notes (Signed)
[facility] called for transport 
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
## 2017-07-15 NOTE — MAU Note (Signed)
Per Dr. Loma Boston, MD; patient only needs Tocometer monitoring until 4 hour requirement is met for observation of patient.

## 2017-07-15 NOTE — ED Notes (Signed)
[facility]

## 2017-07-15 NOTE — ED Notes (Signed)
OB rapid called 
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
## 2017-07-15 NOTE — ED Notes (Signed)
Patient transported to X-ray 

## 2017-07-16 ENCOUNTER — Institutional Professional Consult (permissible substitution): Payer: Medicaid Other

## 2017-07-16 MED ORDER — IBUPROFEN 600 MG PO TABS: 600.0000 mg | ORAL_TABLET | Freq: Four times a day (QID) | ORAL | 0 refills | Status: DC | PRN

## 2017-07-16 NOTE — BH Specialist Note (Deleted)
Integrated Behavioral Health Initial Visit  MRN: [id number] Name: Lauren Conway   Session Start time: *** Session End [id number]: *** Total time: {IBH Total Time:21014050}  Type of Service: Integrated Behavioral Health- Individual/Family Interpretor:No. Interpretor Name and Language: n/a   Warm Hand Off Completed.       SUBJECTIVE: Lauren Conway is a 26 y.o. female accompanied by {Persons; PED relatives w/patient:19415}. Patient was referred by Orvilla Cornwallachelle Denney, CNM for anxiety. Patient reports the following symptoms/concerns: *** Duration of problem: ***; Severity of problem: {Mild/Moderate/Severe:20260}  OBJECTIVE: Mood: {BHH MOOD:22306} and Affect: {BHH AFFECT:22307} Risk of harm to self or others: {CHL AMB BH Suicide Current Mental Status:21022748}   LIFE CONTEXT: Family and Social: *** School/Work: *** Self-Care: *** Life Changes: Current pregnancy ***  GOALS ADDRESSED: Patient will reduce symptoms of: {IBH Symptoms:21014056} and increase knowledge and/or ability of: {IBH Patient Tools:21014057} and also: {IBH Goals:21014053}   INTERVENTIONS: {IBH Interventions:21014054}  Standardized Assessments completed: GAD-7 and PHQ 9  ASSESSMENT: Patient currently experiencing ***. Patient may benefit from psychoeducation and brief therapeutic interventions regarding coping with ***.  PLAN: 1. Follow up with behavioral health clinician on : *** 2. Behavioral recommendations: *** 3. Referral(s): {IBH Referrals:21014055} 4. "From scale of 1-10, how likely are you to follow plan?": ***  Rae LipsJamie C Alexandros Ewan, LCSWA  Depression screen [facility] 2/9 06/16/2015 06/16/2015 04/25/2015 04/11/2015 03/03/2015  Decreased Interest 3 3 0 0 0  Down, Depressed, Hopeless 2 2 0 0 0  PHQ - 2 Score 5 5 0 0 0  Altered sleeping 3 3 - - -  Tired, decreased energy 2 1 - - -  Change in appetite 3 3 - - -  Feeling bad or failure about yourself  3 - - - -  Trouble concentrating 2 2 - - -  Moving slowly or  fidgety/restless 0 0 - - -  Suicidal thoughts 3 3 - - -  PHQ-9 Score 21 17 - - -  '
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
## 2017-07-17 ENCOUNTER — Telehealth: Payer: Self-pay | Admitting: Clinical

## 2017-07-17 NOTE — Telephone Encounter (Signed)
Left HIPPA-compliant message to return [facility] at [phone].   Pt no-showed on 07-16-17; pt may call to reschedule at [phone].

## 2017-07-21 ENCOUNTER — Other Ambulatory Visit: Payer: Self-pay

## 2017-07-21 MED ORDER — SERTRALINE HCL 25 MG PO TABS: 50.0000 mg | ORAL_TABLET | Freq: Every day | ORAL | 5 refills | Status: DC

## 2017-07-24 ENCOUNTER — Ambulatory Visit (HOSPITAL_COMMUNITY): Payer: Medicaid Other

## 2017-07-25 ENCOUNTER — Encounter: Payer: Medicaid Other | Admitting: Certified Nurse Midwife

## 2017-07-28 ENCOUNTER — Ambulatory Visit (INDEPENDENT_AMBULATORY_CARE_PROVIDER_SITE_OTHER): Payer: Medicaid Other | Admitting: Certified Nurse Midwife

## 2017-07-28 ENCOUNTER — Encounter: Payer: Self-pay | Admitting: Certified Nurse Midwife

## 2017-07-28 VITALS — BP 126/70 | HR 101 | Wt 193.4 lb

## 2017-07-28 DIAGNOSIS — Z2839 Other underimmunization status: Secondary | ICD-10-CM

## 2017-07-28 DIAGNOSIS — Z3480 Encounter for supervision of other normal pregnancy, unspecified trimester: Secondary | ICD-10-CM

## 2017-07-28 DIAGNOSIS — A609 Anogenital herpesviral infection, unspecified: Secondary | ICD-10-CM

## 2017-07-28 DIAGNOSIS — O09899 Supervision of other high risk pregnancies, unspecified trimester: Secondary | ICD-10-CM

## 2017-07-28 DIAGNOSIS — O09892 Supervision of other high risk pregnancies, second trimester: Secondary | ICD-10-CM

## 2017-07-28 DIAGNOSIS — Z283 Underimmunization status: Secondary | ICD-10-CM
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
## 2017-07-29 ENCOUNTER — Ambulatory Visit (HOSPITAL_COMMUNITY)
Admission: RE | Admit: 2017-07-29 | Discharge: 2017-07-29 | Disposition: A | Discharge status: Home / Self Care | Payer: Medicaid Other | Source: Ambulatory Visit | Attending: Certified Nurse Midwife | Admitting: Certified Nurse Midwife

## 2017-07-29 DIAGNOSIS — Z3480 Encounter for supervision of other normal pregnancy, unspecified trimester: Secondary | ICD-10-CM

## 2017-07-29 DIAGNOSIS — Z362 Encounter for other antenatal screening follow-up: Secondary | ICD-10-CM | POA: Diagnosis not present

## 2017-07-29 DIAGNOSIS — Z3A24 24 weeks gestation of pregnancy: Secondary | ICD-10-CM | POA: Insufficient documentation

## 2017-07-29 NOTE — Addendum Note (Signed)
Encounter addended by: Drue NovelKeatts, Violanda Bobeck J, RDMS on: 07/29/2017  4:47 PM<BR>    Actions taken: Imaging Exam ended

## 2017-07-31 ENCOUNTER — Other Ambulatory Visit: Payer: Self-pay | Admitting: Certified Nurse Midwife

## 2017-07-31 DIAGNOSIS — Z3480 Encounter for supervision of other normal pregnancy, unspecified trimester: Secondary | ICD-10-CM

## 2017-08-13 ENCOUNTER — Encounter (HOSPITAL_COMMUNITY): Payer: Self-pay

## 2017-08-13 ENCOUNTER — Inpatient Hospital Stay (HOSPITAL_COMMUNITY)
Admission: AD | Admit: 2017-08-13 | Discharge: 2017-08-13 | Disposition: A | Discharge status: Home / Self Care | Payer: Medicaid Other | Source: Ambulatory Visit | Attending: Obstetrics and Gynecology | Admitting: Obstetrics and Gynecology

## 2017-08-13 DIAGNOSIS — F419 Anxiety disorder, unspecified: Secondary | ICD-10-CM | POA: Insufficient documentation

## 2017-08-13 DIAGNOSIS — O99342 Other mental disorders complicating pregnancy, second trimester: Secondary | ICD-10-CM | POA: Diagnosis not present

## 2017-08-13 DIAGNOSIS — O99512 Diseases of the respiratory system complicating pregnancy, second trimester: Secondary | ICD-10-CM | POA: Insufficient documentation

## 2017-08-13 DIAGNOSIS — O99012 Anemia complicating pregnancy, second trimester: Secondary | ICD-10-CM | POA: Diagnosis not present

## 2017-08-13 DIAGNOSIS — K6289 Other specified diseases of anus and rectum: Secondary | ICD-10-CM | POA: Insufficient documentation

## 2017-08-13 DIAGNOSIS — J45909 Unspecified asthma, uncomplicated: Secondary | ICD-10-CM | POA: Insufficient documentation

## 2017-08-13 DIAGNOSIS — N83209 Unspecified ovarian cyst, unspecified side: Secondary | ICD-10-CM | POA: Diagnosis not present

## 2017-08-13 DIAGNOSIS — R102 Pelvic and perineal pain: Secondary | ICD-10-CM | POA: Diagnosis not present

## 2017-08-13 DIAGNOSIS — O3482 Maternal care for other abnormalities of pelvic organs, second trimester: Secondary | ICD-10-CM | POA: Diagnosis not present

## 2017-08-13 DIAGNOSIS — Z87891 Personal history of nicotine dependence: Secondary | ICD-10-CM | POA: Insufficient documentation

## 2017-08-13 DIAGNOSIS — Z3A27 27 weeks gestation of pregnancy: Secondary | ICD-10-CM

## 2017-08-13 DIAGNOSIS — Z3689 Encounter for other specified antenatal screening: Secondary | ICD-10-CM

## 2017-08-13 DIAGNOSIS — O26892 Other specified pregnancy related conditions, second trimester: Secondary | ICD-10-CM

## 2017-08-13 DIAGNOSIS — O26899 Other specified pregnancy related conditions, unspecified trimester: Secondary | ICD-10-CM

## 2017-08-13 LAB — WET PREP, GENITAL
Clue Cells Wet Prep HPF POC: NONE SEEN
Sperm: NONE SEEN
Trich, Wet Prep: NONE SEEN
Yeast Wet Prep HPF POC: NONE SEEN

## 2017-08-13 LAB — URINALYSIS, ROUTINE W REFLEX MICROSCOPIC
BILIRUBIN URINE: NEGATIVE
GLUCOSE, UA: NEGATIVE mg/dL
HGB URINE DIPSTICK: NEGATIVE
KETONES UR: NEGATIVE mg/dL
Leukocytes, UA: NEGATIVE
Nitrite: NEGATIVE
PROTEIN: NEGATIVE mg/dL
Specific Gravity, Urine: 1.012 (ref 1.005–1.030)
pH: 7 (ref 5.0–8.0)

## 2017-08-13 NOTE — Progress Notes (Signed)
D/c instructions discussed & given.  Pt c/o of rash on ankle.  Report given to CNM.

## 2017-08-13 NOTE — MAU Provider Note (Signed)
History     CSN: [id number]  Arrival date and time: 08/13/17 1749   First Provider Initiated Contact with Patient 08/13/17 1838      Chief Complaint  Patient presents with  . Abdominal Pain  . pelvic pressure   [id number] @27  wks here with pelvic pressure and rectal pain since last night. Has not tried anything for the pain. Reports hard stool at times but had soft BM yesterday. Denies hemorrhoids. Denies VB, vaginal discharge, ctx, and LOF.     OB History    Gravida Para Term Preterm AB Living   5 2 2   2 2    SAB TAB Ectopic Multiple Live Births   2     0 2      Past Medical History:  Diagnosis Date  . Anemia   . Anxiety   . Asthma    uses inhaler, last used 8/22  . Depression   . Eczema   . History of chlamydia    12/2008; 03/09/2009  . History of gonorrhea    12/2008; 09/2010;   . 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 Ovarian cyst   . PID (pelvic inflammatory disease)     Past Surgical History:  Procedure Laterality Date  . NO PAST SURGERIES      Family History  Problem Relation Age of Onset  . Hypertension Mother   . Depression Mother   . Breast cancer Paternal Grandmother   . Cancer Paternal Grandmother        breast  . Diabetes Paternal Grandmother   . Asthma Maternal Grandmother   . Other Neg Hx     Social History  Substance Use Topics  . Smoking status: Former Smoker    Packs/day: 0.00    Years: 0.50    Types: Cigarettes    Quit date: 03/16/2017  . Smokeless tobacco: Never Used  . Alcohol use No     Comment: Rare    Allergies:  Allergies  Allergen Reactions  . Eggs Or Egg-Derived Products Hives and Swelling  . Latex Itching and Swelling    Prescriptions Prior to Admission  Medication Sig Dispense Refill Last Dose  . acetaminophen (TYLENOL) 500 MG tablet Take 1,000 mg by mouth every 6 (six) hours as needed for moderate pain.    Taking  . albuterol (PROVENTIL HFA;VENTOLIN HFA) 108 (90 Base) MCG/ACT inhaler  Inhale 1-2 puffs into the lungs every 6 (six) hours as needed for wheezing or shortness of breath. 1 Inhaler 2 07/04/2017 at Unknown time  . cetirizine (ZYRTEC ALLERGY) 10 MG tablet Take 1 tablet (10 mg total) by mouth daily. 60 tablet 1 Past Week at Unknown time  . Doxylamine-Pyridoxine ER (BONJESTA) 20-20 MG TBCR Take 1 tablet by mouth 2 (two) times daily. 60 tablet 6 Past Week at Unknown time  . Elastic Bandages & Supports (COMFORT FIT MATERNITY SUPP LG) MISC 1 Units by Does not apply route daily. 1 each 0 07/04/2017 at Unknown time  . famotidine (PEPCID) 20 MG tablet Take 1 tablet (20 mg total) by mouth 2 (two) times daily. 60 tablet 2 07/04/2017 at Unknown time  . fluticasone (FLOVENT HFA) 110 MCG/ACT inhaler Inhale 2 puffs into the lungs once.   07/04/2017 at Unknown time  . hydrOXYzine (VISTARIL) 50 MG capsule Take 1 capsule (50 mg total) by mouth 3 (three) times daily as needed. 90 capsule 4 Past Week at Unknown time  . ibuprofen (ADVIL,MOTRIN) 600 MG tablet Take 1 tablet (  600 mg total) by mouth every 6 (six) hours as needed. 10 tablet 0   . Prenat-FeCbn-FeAspGl-FA-Omega (OB COMPLETE PETITE) 35-5-1-200 MG CAPS Take 1 tablet by mouth daily. 30 capsule 12 Past Week at Unknown time  . sertraline (ZOLOFT) 25 MG tablet Take 2 tablets (50 mg total) by mouth daily. 30 tablet 5   . valACYclovir (VALTREX) 500 MG tablet TAKE 1 TABLET BY MOUTH EVERY DAY TAKE TWICE A DAY FOR 3 DAYS FOR FLARE 36 tablet 6     Review of Systems  Gastrointestinal: Negative for abdominal pain.  Genitourinary: Positive for pelvic pain. Negative for vaginal bleeding and vaginal discharge.   Physical Exam   Blood pressure 134/62, pulse 100, temperature 98.3 F (36.8 C), temperature source Oral, resp. rate 18, last menstrual period 12/04/2016.  Physical Exam  Constitutional: She is oriented to person, place, and time. She appears well-developed and well-nourished. No distress.  HENT:  Head: Normocephalic and atraumatic.   Neck: Normal range of motion.  Cardiovascular: Normal rate.   Respiratory: Effort normal. No respiratory distress.  GI: Soft. She exhibits no distension. There is no tenderness.  gravid  Genitourinary:  Genitourinary Comments: Cervix closed/thick  Musculoskeletal: Normal range of motion.  Neurological: She is alert and oriented to person, place, and time.  Skin: Skin is warm and dry.  Psychiatric: She has a normal mood and affect.  EFM: 125 bpm, mod variability, + accels, no decels Toco: none  Results for orders placed or performed during the hospital encounter of 08/13/17 (from the past 24 hour(s))  Urinalysis, Routine w reflex microscopic     Status: None   Collection Time: 08/13/17  6:06 PM  Result Value Ref Range   Color, Urine YELLOW YELLOW   APPearance CLEAR CLEAR   Specific Gravity, Urine 1.012 1.005 - 1.030   pH 7.0 5.0 - 8.0   Glucose, UA NEGATIVE NEGATIVE mg/dL   Hgb urine dipstick NEGATIVE NEGATIVE   Bilirubin Urine NEGATIVE NEGATIVE   Ketones, ur NEGATIVE NEGATIVE mg/dL   Protein, ur NEGATIVE NEGATIVE mg/dL   Nitrite NEGATIVE NEGATIVE   Leukocytes, UA NEGATIVE NEGATIVE  Wet prep, genital     Status: Abnormal   Collection Time: 08/13/17  6:50 PM  Result Value Ref Range   Yeast Wet Prep HPF POC NONE SEEN NONE SEEN   Trich, Wet Prep NONE SEEN NONE SEEN   Clue Cells Wet Prep HPF POC NONE SEEN NONE SEEN   WBC, Wet Prep HPF POC FEW (A) NONE SEEN   Sperm NONE SEEN    MAU Course  Procedures  MDM Labs ordered and reviewed. No evidence of UTI, vaginal infection, or PTL. Pressure likely d/t fetal position and/or multip status. Recommend comfort measures. Stable for discharge home.  Assessment and Plan   1. [redacted] weeks gestation of pregnancy   2. NST (non-stress test) reactive   3. Pelvic pressure in pregnancy    Discharge home Follow up in OB office as scheduled PTL precautions Maternity support belt- already has Tylenol, warm baths prn  Allergies as of  08/13/2017      Reactions   Eggs Or Egg-derived Products Hives, Swelling   Latex Itching, Swelling      Medication List    TAKE these medications   acetaminophen 500 MG tablet Commonly known as:  TYLENOL Take 1,000 mg by mouth every 6 (six) hours as needed for moderate pain.   albuterol 108 (90 Base) MCG/ACT inhaler Commonly known as:  PROVENTIL HFA;VENTOLIN HFA Inhale 1-2 puffs  into the lungs every 6 (six) hours as needed for wheezing or shortness of breath.   cetirizine 10 MG tablet Commonly known as:  ZYRTEC ALLERGY Take 1 tablet (10 mg total) by mouth daily.   COMFORT FIT MATERNITY SUPP LG Misc 1 Units by Does not apply route daily.   Doxylamine-Pyridoxine ER 20-20 MG Tbcr Commonly known as:  BONJESTA Take 1 tablet by mouth 2 (two) times daily.   famotidine 20 MG tablet Commonly known as:  PEPCID Take 1 tablet (20 mg total) by mouth 2 (two) times daily.   fluticasone 110 MCG/ACT inhaler Commonly known as:  FLOVENT HFA Inhale 2 puffs into the lungs once.   hydrOXYzine 50 MG capsule Commonly known as:  VISTARIL Take 1 capsule (50 mg total) by mouth 3 (three) times daily as needed.   ibuprofen 600 MG tablet Commonly known as:  ADVIL,MOTRIN Take 1 tablet (600 mg total) by mouth every 6 (six) hours as needed.   OB COMPLETE PETITE 35-5-1-200 MG Caps Take 1 tablet by mouth daily.   sertraline 25 MG tablet Commonly known as:  ZOLOFT Take 2 tablets (50 mg total) by mouth daily.   valACYclovir 500 MG tablet Commonly known as:  VALTREX TAKE 1 TABLET BY MOUTH EVERY DAY TAKE TWICE A DAY FOR 3 DAYS FOR FLARE            Discharge Care Instructions        Start     Ordered   08/13/17 0000  Discharge patient    Question Answer Comment  Discharge disposition 01-Home or Self Care   Discharge patient date 08/13/2017      08/13/17 [id number]     Lauren Conway, Ina HomesCNM 08/13/2017, 6:50 PM

## 2017-08-13 NOTE — MAU Note (Signed)
Pt states she began feeling constant lower abdm pressure last night that has got worse approx 1400 today.  States that pain is "bearable" now.  States she feels constant rectal pressure now.

## 2017-08-13 NOTE — Discharge Instructions (Signed)

## 2017-08-13 NOTE — MAU Note (Signed)
Pt C/O pelvic pressure & abd pain since last night, thought it was gas at first.  Pain is more tight today, pulling sensation in vagina & rectum.  Denies bleeding or LOF.

## 2017-08-13 NOTE — Progress Notes (Signed)
CNM aware. Pt [facility]  Pt verb understanding.

## 2017-08-14 LAB — GC/CHLAMYDIA PROBE AMP (~~LOC~~) NOT AT ARMC
CHLAMYDIA, DNA PROBE: NEGATIVE
Neisseria Gonorrhea: NEGATIVE

## 2017-08-25 ENCOUNTER — Encounter: Payer: Medicaid Other | Admitting: Certified Nurse Midwife

## 2017-08-29 ENCOUNTER — Other Ambulatory Visit: Payer: Medicaid Other

## 2017-08-29 ENCOUNTER — Encounter: Payer: Self-pay | Admitting: Certified Nurse Midwife

## 2017-08-29 ENCOUNTER — Ambulatory Visit (INDEPENDENT_AMBULATORY_CARE_PROVIDER_SITE_OTHER): Payer: Medicaid Other | Admitting: Certified Nurse Midwife

## 2017-08-29 VITALS — BP 124/77 | HR 98 | Wt 193.5 lb

## 2017-08-29 DIAGNOSIS — L309 Dermatitis, unspecified: Secondary | ICD-10-CM | POA: Insufficient documentation

## 2017-08-29 DIAGNOSIS — Z2839 Other underimmunization status: Secondary | ICD-10-CM

## 2017-08-29 DIAGNOSIS — Z283 Underimmunization status: Secondary | ICD-10-CM

## 2017-08-29 DIAGNOSIS — Z3480 Encounter for supervision of other normal pregnancy, unspecified trimester: Secondary | ICD-10-CM

## 2017-08-29 DIAGNOSIS — O09899 Supervision of other high risk pregnancies, unspecified trimester: Secondary | ICD-10-CM

## 2017-08-29 DIAGNOSIS — H6123 Impacted cerumen, bilateral: Secondary | ICD-10-CM

## 2017-08-29 DIAGNOSIS — R102 Pelvic and perineal pain: Secondary | ICD-10-CM

## 2017-08-29 DIAGNOSIS — O09893 Supervision of other high risk pregnancies, third trimester: Secondary | ICD-10-CM

## 2017-08-29 DIAGNOSIS — J4520 Mild intermittent asthma, uncomplicated: Secondary | ICD-10-CM | POA: Insufficient documentation

## 2017-08-29 DIAGNOSIS — A609 Anogenital herpesviral infection, unspecified: Secondary | ICD-10-CM

## 2017-08-29 DIAGNOSIS — Z3483 Encounter for supervision of other normal pregnancy, third trimester: Secondary | ICD-10-CM

## 2017-08-29 DIAGNOSIS — O26899 Other specified pregnancy related conditions, unspecified trimester: Secondary | ICD-10-CM

## 2017-08-29 NOTE — Progress Notes (Signed)
Patient reports good fetal movement and occasional pressure, declines contractions.

## 2017-08-29 NOTE — Progress Notes (Signed)
   PRENATAL VISIT NOTE  Subjective:  Lauren Conway is a 26 y.o. [id number] at [redacted]w[redacted]d being seen today for ongoing prenatal care.  She is currently monitored for the following issues for this low-risk pregnancy and has Supervision of other normal pregnancy, antepartum; HSV (herpes simplex virus) anogenital infection; Maternal varicella, non-immune; Substance abuse affecting pregnancy, antepartum; Pain of round ligament affecting pregnancy, antepartum; Anxiety; Asthma, chronic, mild intermittent, uncomplicated; and Eczema on her problem list.  Patient reports no bleeding, no contractions, no cramping, no leaking and ear ache, muffled sounds with hearing.  Contractions: Not present. Vag. Bleeding: None.  Movement: Present. Denies leaking of fluid.   The following portions of the patient's history were reviewed and updated as appropriate: allergies, current medications, past family history, past medical history, past social history, past surgical history and problem list. Problem list updated.  Objective:   Vitals:   08/29/17 0823  BP: 124/77  Pulse: 98  Weight: 193 lb 8 oz (87.8 kg)    Fetal Status: Fetal Heart Rate (bpm): 135; doppler Fundal Height: 28 cm Movement: Present     General:  Alert, oriented and cooperative. Patient is in no acute distress.  Skin: Skin is warm and dry. No rash noted.   Cardiovascular: Normal heart rate noted  Respiratory: Normal respiratory effort, no problems with respiration noted  Abdomen: Soft, gravid, appropriate for gestational age.  Pain/Pressure: Present     Pelvic: Cervical exam deferred        Extremities: Normal range of motion.  Edema: None  EENT:   Bilateral cerumen impaction  Mental Status:  Normal mood and affect. Normal behavior. Normal judgment and thought content.   Assessment and Plan:  Pregnancy: A5W0981 at [redacted]w[redacted]d  1. Supervision of other normal pregnancy, antepartum      Doing well.  Ear impaction with cerumen, OTC Debrox drops.   -  Glucose Tolerance, 2 Hours w/1 Hour - HIV antibody - RPR - CBC  2. HSV (herpes simplex virus) anogenital infection      Taking Valtrex  3. Maternal varicella, non-immune     Varicella postpartum  4. Pain of round ligament affecting pregnancy, antepartum     Has maternity support belt  5. Eczema, unspecified type     Stable currently.  Preterm labor symptoms and general obstetric precautions including but not limited to vaginal bleeding, contractions, leaking of fluid and fetal movement were reviewed in detail with the patient. Please refer to After Visit Summary for other counseling recommendations.  Return in about 2 weeks (around 09/12/2017) for ROB.   Roe Coombs, CNM

## 2017-08-30 LAB — RPR: RPR: NONREACTIVE

## 2017-08-30 LAB — CBC
Hematocrit: 29.5 % — ABNORMAL LOW (ref 34.0–46.6)
Hemoglobin: 9.9 g/dL — ABNORMAL LOW (ref 11.1–15.9)
MCH: 29.6 pg (ref 26.6–33.0)
MCHC: 33.6 g/dL (ref 31.5–35.7)
MCV: 88 fL (ref 79–97)
Platelets: 353 10*3/uL (ref 150–379)
RBC: 3.35 x10E6/uL — ABNORMAL LOW (ref 3.77–5.28)
RDW: 13 % (ref 12.3–15.4)
WBC: 8.5 10*3/uL (ref 3.4–10.8)

## 2017-08-30 LAB — GLUCOSE TOLERANCE, 2 HOURS W/ 1HR
GLUCOSE, 2 HOUR: 110 mg/dL (ref 65–152)
GLUCOSE, FASTING: 79 mg/dL (ref 65–91)
Glucose, 1 hour: 128 mg/dL (ref 65–179)

## 2017-08-30 LAB — HIV ANTIBODY (ROUTINE TESTING W REFLEX): HIV SCREEN 4TH GENERATION: NONREACTIVE

## 2017-09-01 ENCOUNTER — Other Ambulatory Visit: Payer: Self-pay | Admitting: Certified Nurse Midwife

## 2017-09-01 ENCOUNTER — Encounter: Payer: Self-pay | Admitting: *Deleted

## 2017-09-01 DIAGNOSIS — Z3480 Encounter for supervision of other normal pregnancy, unspecified trimester: Secondary | ICD-10-CM

## 2017-09-01 DIAGNOSIS — O99013 Anemia complicating pregnancy, third trimester: Secondary | ICD-10-CM | POA: Insufficient documentation

## 2017-09-01 MED ORDER — CITRANATAL BLOOM 90-1 MG PO TABS: 1.0000 | ORAL_TABLET | Freq: Every day | ORAL | 12 refills | Status: DC

## 2017-09-12 ENCOUNTER — Encounter: Payer: Self-pay | Admitting: *Deleted

## 2017-09-12 ENCOUNTER — Ambulatory Visit (INDEPENDENT_AMBULATORY_CARE_PROVIDER_SITE_OTHER): Payer: Medicaid Other | Admitting: Certified Nurse Midwife

## 2017-09-12 VITALS — BP 118/73 | HR 103 | Wt 195.3 lb

## 2017-09-12 DIAGNOSIS — Z283 Underimmunization status: Secondary | ICD-10-CM

## 2017-09-12 DIAGNOSIS — L309 Dermatitis, unspecified: Secondary | ICD-10-CM

## 2017-09-12 DIAGNOSIS — O99013 Anemia complicating pregnancy, third trimester: Secondary | ICD-10-CM

## 2017-09-12 DIAGNOSIS — J069 Acute upper respiratory infection, unspecified: Secondary | ICD-10-CM

## 2017-09-12 DIAGNOSIS — Z3480 Encounter for supervision of other normal pregnancy, unspecified trimester: Secondary | ICD-10-CM

## 2017-09-12 DIAGNOSIS — Z3483 Encounter for supervision of other normal pregnancy, third trimester: Secondary | ICD-10-CM

## 2017-09-12 DIAGNOSIS — Z2839 Other underimmunization status: Secondary | ICD-10-CM

## 2017-09-12 DIAGNOSIS — O09899 Supervision of other high risk pregnancies, unspecified trimester: Secondary | ICD-10-CM

## 2017-09-12 MED ORDER — BENZONATATE 100 MG PO CAPS: 100.0000 mg | ORAL_CAPSULE | Freq: Three times a day (TID) | ORAL | 0 refills | Status: DC | PRN

## 2017-09-12 MED ORDER — TRIAMCINOLONE ACETONIDE 0.5 % EX OINT: 1.0000 "application " | TOPICAL_OINTMENT | Freq: Two times a day (BID) | CUTANEOUS | 0 refills | Status: DC

## 2017-09-12 MED ORDER — AMOXICILLIN-POT CLAVULANATE 875-125 MG PO TABS: 1.0000 | ORAL_TABLET | Freq: Two times a day (BID) | ORAL | 0 refills | Status: DC

## 2017-09-12 MED ORDER — FLUTICASONE PROPIONATE 50 MCG/ACT NA SUSP: 2.0000 | Freq: Every day | NASAL | 2 refills | Status: DC

## 2017-09-12 NOTE — Progress Notes (Signed)
Patient reports good fetal movement with pressure, and irregular contractions earlier in the week.

## 2017-09-12 NOTE — Progress Notes (Signed)
   PRENATAL VISIT NOTE  Subjective:  Lauren Conway is a 26 y.o. Z6X0960 at [redacted]w[redacted]d being seen today for ongoing prenatal care.  She is currently monitored for the following issues for this low-risk pregnancy and has Supervision of other normal pregnancy, antepartum; HSV (herpes simplex virus) anogenital infection; Maternal varicella, non-immune; Substance abuse affecting pregnancy, antepartum; Pain of round ligament affecting pregnancy, antepartum; Anxiety; Asthma, chronic, mild intermittent, uncomplicated; Eczema; and Anemia affecting pregnancy in third trimester on her problem list.  Patient reports no bleeding, no contractions, no cramping, no leaking and acute uri. URI symptoms, denies fever, reports nasal congestion, cough, + sputum production, sinus pressure   . Vag. Bleeding: None.  Movement: Present. Denies leaking of fluid.   The following portions of the patient's history were reviewed and updated as appropriate: allergies, current medications, past family history, past medical history, past social history, past surgical history and problem list. Problem list updated.  Objective:   Vitals:   09/12/17 1114  BP: 118/73  Pulse: (!) 103  Weight: 195 lb 4.8 oz (88.6 kg)    Fetal Status: Fetal Heart Rate (bpm): 143; doppler Fundal Height: 31 cm Movement: Present     General:  Alert, oriented and cooperative. Patient is in no acute distress.  Skin: Skin is warm and dry. No rash noted.   Cardiovascular: Normal heart rate noted  Respiratory: Normal respiratory effort, no problems with respiration noted  Abdomen: Soft, gravid, appropriate for gestational age.  Pain/Pressure: Present     Pelvic: Cervical exam deferred        Extremities: Normal range of motion.  Edema: Trace  HEENT: Right ear impacted with cerumen.  Left ear: fluid noted, no erythema   Mental Status:  Normal mood and affect. Normal behavior. Normal judgment and thought content.   Assessment and Plan:  Pregnancy:  [id number] at [redacted]w[redacted]d  1. Supervision of other normal pregnancy, antepartum       2. Maternal varicella, non-immune     Varicella postpartum  3. Anemia affecting pregnancy in third trimester     Taking Bloom  4. Acute URI    VS. Bronchitis, fluid in left ear - benzonatate (TESSALON PERLES) 100 MG capsule; Take 1 capsule (100 mg total) by mouth 3 (three) times daily as needed for cough.  Dispense: 30 capsule; Refill: 0 - amoxicillin-clavulanate (AUGMENTIN) 875-125 MG tablet; Take 1 tablet by mouth 2 (two) times daily.  Dispense: 14 tablet; Refill: 0 - fluticasone (FLONASE) 50 MCG/ACT nasal spray; Place 2 sprays into both nostrils daily.  Dispense: 16 g; Refill: 2  5. Eczema, unspecified type     Versus PUPP: if worse obtain LFTs/bile acids next ROB - triamcinolone ointment (KENALOG) 0.5 %; Apply 1 application topically 2 (two) times daily.  Dispense: 30 g; Refill: 0  Preterm labor symptoms and general obstetric precautions including but not limited to vaginal bleeding, contractions, leaking of fluid and fetal movement were reviewed in detail with the patient. Please refer to After Visit Summary for other counseling recommendations.  Return in about 2 weeks (around 09/26/2017) for ROB.   Roe Coombs, CNM

## 2017-09-17 ENCOUNTER — Encounter (HOSPITAL_COMMUNITY): Payer: Self-pay | Admitting: *Deleted

## 2017-09-17 ENCOUNTER — Inpatient Hospital Stay (HOSPITAL_COMMUNITY)
Admission: AD | Admit: 2017-09-17 | Discharge: 2017-09-17 | Disposition: A | Discharge status: Home / Self Care | Payer: Medicaid Other | Source: Ambulatory Visit | Attending: Obstetrics & Gynecology | Admitting: Obstetrics & Gynecology

## 2017-09-17 ENCOUNTER — Telehealth: Payer: Self-pay

## 2017-09-17 DIAGNOSIS — Z3A32 32 weeks gestation of pregnancy: Secondary | ICD-10-CM

## 2017-09-17 DIAGNOSIS — F17210 Nicotine dependence, cigarettes, uncomplicated: Secondary | ICD-10-CM | POA: Insufficient documentation

## 2017-09-17 DIAGNOSIS — R109 Unspecified abdominal pain: Secondary | ICD-10-CM | POA: Diagnosis present

## 2017-09-17 DIAGNOSIS — O99333 Smoking (tobacco) complicating pregnancy, third trimester: Secondary | ICD-10-CM | POA: Diagnosis not present

## 2017-09-17 DIAGNOSIS — R102 Pelvic and perineal pain: Secondary | ICD-10-CM | POA: Diagnosis not present

## 2017-09-17 DIAGNOSIS — Z3689 Encounter for other specified antenatal screening: Secondary | ICD-10-CM

## 2017-09-17 DIAGNOSIS — O26893 Other specified pregnancy related conditions, third trimester: Secondary | ICD-10-CM | POA: Diagnosis not present

## 2017-09-17 DIAGNOSIS — O26899 Other specified pregnancy related conditions, unspecified trimester: Secondary | ICD-10-CM

## 2017-09-17 DIAGNOSIS — O99320 Drug use complicating pregnancy, unspecified trimester: Secondary | ICD-10-CM

## 2017-09-17 LAB — URINALYSIS, ROUTINE W REFLEX MICROSCOPIC
Bilirubin Urine: NEGATIVE
Glucose, UA: NEGATIVE mg/dL
HGB URINE DIPSTICK: NEGATIVE
Ketones, ur: NEGATIVE mg/dL
Leukocytes, UA: NEGATIVE
Nitrite: NEGATIVE
PROTEIN: NEGATIVE mg/dL
Specific Gravity, Urine: 1.005 (ref 1.005–1.030)
pH: 6 (ref 5.0–8.0)

## 2017-09-17 NOTE — Telephone Encounter (Signed)
Contacted patient about fmla documents and payment, advised that they would be ready at front office for her, pt stated that she would pick up today before closing.

## 2017-09-17 NOTE — MAU Provider Note (Signed)
History     CSN: [id number]  Arrival date and time: 09/17/17 1009   First Provider Initiated Contact with Patient 09/17/17 1116      Chief Complaint  Patient presents with  . Abdominal Pain   [id number] .0 weeks here with pelvic pain and pressure. Describes pain as intermittent and is ongoing but worse after working last night. Also reports anal spasms. Denies VB, LOF, and ctx. Reports good FM. She is using a maternity belt at times for the pressure but is not helping. Has not taken anything for it.   OB History    Gravida Para Term Preterm AB Living   SAB TAB Ectopic Multiple Live Births   2     0 2      Past Medical History:  Diagnosis Date  . Anemia   . Anxiety   . Asthma    uses inhaler, last used 8/22  . Depression   . Eczema   . History of chlamydia    12/2008; 03/09/2009  . History of gonorrhea    12/2008; 09/2010;   . 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 PID (pelvic inflammatory disease)     Past Surgical History:  Procedure Laterality Date  . NO PAST SURGERIES      Family History  Problem Relation Age of Onset  . Hypertension Mother   . Depression Mother   . Breast cancer Paternal Grandmother   . Cancer Paternal Grandmother        breast  . Diabetes Paternal Grandmother   . Asthma Maternal Grandmother   . Other Neg Hx     Social History  Substance Use Topics  . Smoking status: Current Some Day Smoker    Packs/day: 0.00    Years: 0.50    Types: Cigarettes    Last attempt to quit: 03/16/2017  . Smokeless tobacco: Never Used  . Alcohol use No     Comment: Rare    Allergies:  Allergies  Allergen Reactions  . Eggs Or Egg-Derived Products Hives and Swelling  . Latex Itching and Swelling    Prescriptions Prior to Admission  Medication Sig Dispense Refill Last Dose  . acetaminophen (TYLENOL) 500 MG tablet Take 1,000 mg by mouth every 6 (six) hours as needed for moderate pain.    09/16/2017 at Unknown  time  . amoxicillin-clavulanate (AUGMENTIN) 875-125 MG tablet Take 1 tablet by mouth 2 (two) times daily. 14 tablet 0 09/16/2017 at Unknown time  . benzonatate (TESSALON PERLES) 100 MG capsule Take 1 capsule (100 mg total) by mouth 3 (three) times daily as needed for cough. 30 capsule 0 09/16/2017 at Unknown time  . Doxylamine-Pyridoxine ER (BONJESTA) 20-20 MG TBCR Take 1 tablet by mouth 2 (two) times daily. 60 tablet 6 09/17/2017 at Unknown time  . famotidine (PEPCID) 20 MG tablet Take 1 tablet (20 mg total) by mouth 2 (two) times daily. 60 tablet 2 09/17/2017 at Unknown time  . fluticasone (FLOVENT HFA) 110 MCG/ACT inhaler Inhale 2 puffs into the lungs once.   09/16/2017 at Unknown time  . Prenatal-DSS-FeCb-FeGl-FA (CITRANATAL BLOOM) 90-1 MG TABS Take 1 tablet by mouth daily. 30 tablet 12 09/16/2017 at Unknown time  . sertraline (ZOLOFT) 25 MG tablet Take 2 tablets (50 mg total) by mouth daily. 30 tablet 5 09/16/2017 at Unknown time  . triamcinolone ointment (KENALOG) 0.5 % Apply 1 application topically 2 (two) times daily. 30 g 0  09/16/2017 at Unknown time  . valACYclovir (VALTREX) 500 MG tablet TAKE 1 TABLET BY MOUTH EVERY DAY TAKE TWICE A DAY FOR 3 DAYS FOR FLARE 36 tablet 6 09/16/2017 at Unknown time  . albuterol (PROVENTIL HFA;VENTOLIN HFA) 108 (90 Base) MCG/ACT inhaler Inhale 1-2 puffs into the lungs every 6 (six) hours as needed for wheezing or shortness of breath. 1 Inhaler 2 Taking  . Elastic Bandages & Supports (COMFORT FIT MATERNITY SUPP LG) MISC 1 Units by Does not apply route daily. 1 each 0 Taking  . fluticasone (FLONASE) 50 MCG/ACT nasal spray Place 2 sprays into both nostrils daily. 16 g 2 not started  . hydrOXYzine (VISTARIL) 50 MG capsule Take 1 capsule (50 mg total) by mouth 3 (three) times daily as needed. (Patient not taking: Reported on 09/12/2017) 90 capsule 4 Not Taking    Review of Systems  Gastrointestinal: Negative for abdominal pain.  Genitourinary: Positive for pelvic pain.  Negative for vaginal bleeding.   Physical Exam   Blood pressure 120/66, pulse 98, temperature 98.3 F (36.8 C), temperature source Oral, resp. rate 18, weight 200 lb 4 oz (90.8 kg), last menstrual period 12/04/2016, SpO2 100 %.  Physical Exam  Constitutional: She is oriented to person, place, and time. She appears well-developed and well-nourished. No distress.  HENT:  Head: Normocephalic.  Neck: Normal range of motion.  Cardiovascular: Normal rate.   Respiratory: Effort normal.  GI: Soft. She exhibits no distension. There is no tenderness.  gravid  Genitourinary:  Genitourinary Comments: Cervix closed/thick  Musculoskeletal: Normal range of motion.  Neurological: She is alert and oriented to person, place, and time.  Skin: Skin is warm and dry.  Psychiatric: She has a normal mood and affect.  EFM: 125 bpm, mod variability, + accels, no decels Toco: none  Results for orders placed or performed during the hospital encounter of 09/17/17 (from the past 24 hour(s))  Urinalysis, Routine w reflex microscopic     Status: Abnormal   Collection Time: 09/17/17 10:34 AM  Result Value Ref Range   Color, Urine STRAW (A) YELLOW   APPearance CLEAR CLEAR   Specific Gravity, Urine 1.005 1.005 - 1.030   pH 6.0 5.0 - 8.0   Glucose, UA NEGATIVE NEGATIVE mg/dL   Hgb urine dipstick NEGATIVE NEGATIVE   Bilirubin Urine NEGATIVE NEGATIVE   Ketones, ur NEGATIVE NEGATIVE mg/dL   Protein, ur NEGATIVE NEGATIVE mg/dL   Nitrite NEGATIVE NEGATIVE   Leukocytes, UA NEGATIVE NEGATIVE   MAU Course  Procedures  MDM Labs ordered and reviewed. No evidence of UTI or PTL. Pain and pressure likely physiologic to 3rd trimester pregnancy and worsened by multip status. Discussed comfort measures. Stable for discharge home.  Assessment and Plan  [redacted] weeks gestation Reactive NST Pelvic pain in pregnancy Discharge home Follow up in OB office as scheduled PTL precautions  Allergies as of 09/17/2017       Reactions   Eggs Or Egg-derived Products Hives, Swelling   Latex Itching, Swelling      Medication List    STOP taking these medications   hydrOXYzine 50 MG capsule Commonly known as:  VISTARIL     TAKE these medications   acetaminophen 500 MG tablet Commonly known as:  TYLENOL Take 1,000 mg by mouth every 6 (six) hours as needed for moderate pain.   albuterol 108 (90 Base) MCG/ACT inhaler Commonly known as:  PROVENTIL HFA;VENTOLIN HFA Inhale 1-2 puffs into the lungs every 6 (six) hours as needed for wheezing or shortness  of breath.   amoxicillin-clavulanate 875-125 MG tablet Commonly known as:  AUGMENTIN Take 1 tablet by mouth 2 (two) times daily.   benzonatate 100 MG capsule Commonly known as:  TESSALON PERLES Take 1 capsule (100 mg total) by mouth 3 (three) times daily as needed for cough.   CITRANATAL BLOOM 90-1 MG Tabs Take 1 tablet by mouth daily.   COMFORT FIT MATERNITY SUPP LG Misc 1 Units by Does not apply route daily.   Doxylamine-Pyridoxine ER 20-20 MG Tbcr Commonly known as:  BONJESTA Take 1 tablet by mouth 2 (two) times daily.   famotidine 20 MG tablet Commonly known as:  PEPCID Take 1 tablet (20 mg total) by mouth 2 (two) times daily.   fluticasone 110 MCG/ACT inhaler Commonly known as:  FLOVENT HFA Inhale 2 puffs into the lungs once.   fluticasone 50 MCG/ACT nasal spray Commonly known as:  FLONASE Place 2 sprays into both nostrils daily.   sertraline 25 MG tablet Commonly known as:  ZOLOFT Take 2 tablets (50 mg total) by mouth daily.   triamcinolone ointment 0.5 % Commonly known as:  KENALOG Apply 1 application topically 2 (two) times daily.   valACYclovir 500 MG tablet Commonly known as:  VALTREX TAKE 1 TABLET BY MOUTH EVERY DAY TAKE TWICE A DAY FOR 3 DAYS FOR FLARE      Donette Larry, CNM 09/17/2017, 11:37 AM

## 2017-09-17 NOTE — Discharge Instructions (Signed)

## 2017-09-17 NOTE — MAU Note (Signed)
Been having a lot of pain and pressure.  Just got off work.  Pains have been getting worse.  Butt spasms as well

## 2017-09-19 ENCOUNTER — Ambulatory Visit (INDEPENDENT_AMBULATORY_CARE_PROVIDER_SITE_OTHER): Payer: Medicaid Other | Admitting: Certified Nurse Midwife

## 2017-09-19 VITALS — BP 113/72 | HR 97 | Wt 198.0 lb

## 2017-09-19 DIAGNOSIS — O99343 Other mental disorders complicating pregnancy, third trimester: Secondary | ICD-10-CM

## 2017-09-19 DIAGNOSIS — K219 Gastro-esophageal reflux disease without esophagitis: Secondary | ICD-10-CM

## 2017-09-19 DIAGNOSIS — Z3483 Encounter for supervision of other normal pregnancy, third trimester: Secondary | ICD-10-CM

## 2017-09-19 DIAGNOSIS — O99613 Diseases of the digestive system complicating pregnancy, third trimester: Secondary | ICD-10-CM

## 2017-09-19 DIAGNOSIS — Z3480 Encounter for supervision of other normal pregnancy, unspecified trimester: Secondary | ICD-10-CM

## 2017-09-19 DIAGNOSIS — F329 Major depressive disorder, single episode, unspecified: Secondary | ICD-10-CM

## 2017-09-19 MED ORDER — FAMOTIDINE 20 MG PO TABS: 20.0000 mg | ORAL_TABLET | Freq: Two times a day (BID) | ORAL | 2 refills | Status: DC

## 2017-09-19 MED ORDER — SERTRALINE HCL 25 MG PO TABS: 50.0000 mg | ORAL_TABLET | Freq: Every day | ORAL | 5 refills | Status: DC

## 2017-09-19 NOTE — Progress Notes (Signed)
   PRENATAL VISIT NOTE  Subjective:  Lauren Conway is a 26 y.o. [id number] at [redacted]w[redacted]d being seen today for ongoing prenatal care.  She is currently monitored for the following issues for this low-risk pregnancy and has Supervision of other normal pregnancy, antepartum; HSV (herpes simplex virus) anogenital infection; Maternal varicella, non-immune; Substance abuse affecting pregnancy, antepartum; Pain of round ligament affecting pregnancy, antepartum; Anxiety; Asthma, chronic, mild intermittent, uncomplicated; Eczema; and Anemia affecting pregnancy in third trimester on her problem list.  Patient reports no bleeding, no contractions, no cramping, no leaking and increased water intake encouraged, elevated of legs.  Contractions: Irritability. Vag. Bleeding: None.  Movement: Present. Denies leaking of fluid.   The following portions of the patient's history were reviewed and updated as appropriate: allergies, current medications, past family history, past medical history, past social history, past surgical history and problem list. Problem list updated.  Objective:   Vitals:   09/19/17 1105  BP: 113/72  Pulse: 97  Weight: 198 lb (89.8 kg)    Fetal Status: Fetal Heart Rate (bpm): 141; doppler Fundal Height: 34 cm Movement: Present     General:  Alert, oriented and cooperative. Patient is in no acute distress.  Skin: Skin is warm and dry. No rash noted.   Cardiovascular: Normal heart rate noted  Respiratory: Normal respiratory effort, no problems with respiration noted  Abdomen: Soft, gravid, appropriate for gestational age.  Pain/Pressure: Present     Pelvic: Cervical exam deferred        Extremities: Normal range of motion.  Edema: Moderate pitting, indentation subsides rapidly  Mental Status:  Normal mood and affect. Normal behavior. Normal judgment and thought content.   Assessment and Plan:  Pregnancy: [id number] at [redacted]w[redacted]d  1. Supervision of other normal pregnancy, antepartum     Lower  leg edema d/t activity, limited water intake and salt intake  2. 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  - sertraline (ZOLOFT) 25 MG tablet; Take 2 tablets (50 mg total) by mouth daily.  Dispense: 60 tablet; Refill: 5  3. Gastroesophageal reflux during pregnancy, antepartum, third trimester       - famotidine (PEPCID) 20 MG tablet; Take 1 tablet (20 mg total) by mouth 2 (two) times daily.  Dispense: 60 tablet; Refill: 2  Preterm labor symptoms and general obstetric precautions including but not limited to vaginal bleeding, contractions, leaking of fluid and fetal movement were reviewed in detail with the patient. Please refer to After Visit Summary for other counseling recommendations.  Return in about 2 weeks (around 10/03/2017) for ROB.   Roe Coombs, CNM

## 2017-09-19 NOTE — Progress Notes (Signed)
Pt c/o extreme ankle and foot swelling, started yesterday.

## 2017-09-19 NOTE — Addendum Note (Signed)
Addended by: Orvilla Cornwall A on: 09/19/2017 11:29 AM   Modules accepted: Orders

## 2017-09-26 ENCOUNTER — Encounter: Payer: Medicaid Other | Admitting: Certified Nurse Midwife

## 2017-10-03 ENCOUNTER — Ambulatory Visit (INDEPENDENT_AMBULATORY_CARE_PROVIDER_SITE_OTHER): Payer: Medicaid Other | Admitting: Certified Nurse Midwife

## 2017-10-03 VITALS — BP 120/76 | HR 95 | Wt 199.6 lb

## 2017-10-03 DIAGNOSIS — Z3480 Encounter for supervision of other normal pregnancy, unspecified trimester: Secondary | ICD-10-CM

## 2017-10-03 DIAGNOSIS — Z23 Encounter for immunization: Secondary | ICD-10-CM | POA: Diagnosis not present

## 2017-10-03 DIAGNOSIS — Z3483 Encounter for supervision of other normal pregnancy, third trimester: Secondary | ICD-10-CM

## 2017-10-03 DIAGNOSIS — Z283 Underimmunization status: Secondary | ICD-10-CM

## 2017-10-03 DIAGNOSIS — O09899 Supervision of other high risk pregnancies, unspecified trimester: Secondary | ICD-10-CM

## 2017-10-03 DIAGNOSIS — Z2839 Other underimmunization status: Secondary | ICD-10-CM

## 2017-10-03 NOTE — Progress Notes (Signed)
Patient reports good fetal movement with irregular contractions.  

## 2017-10-03 NOTE — Patient Instructions (Signed)
AREA PEDIATRIC/FAMILY PRACTICE PHYSICIANS  [facility] [address], [address] [place], [place]  [address] Phone - [phone]   Fax - [phone]  [facility] [address] [place], [place] [address] Phone - [phone]   Fax - [phone]  [facility] [address] [place], [place]  [address] Phone - [phone]   Fax - [phone]  [facility] [address] [place], [place]  [address] Phone - [phone]   Fax - [phone]  [facility] [address] [place], [place]  [address] Phone - [phone]   Fax - [phone]  [facility] [address] [place], [place]  [address] Phone - [phone]   Fax - [phone]  [facility] [address] [place], [place]  [address] Phone - [phone]   Fax - [phone]  [facility] [address] [place], [place]  [address] Phone - [phone]   Fax - [phone]  [facility] [address] [place], [place]  [address] Phone - [phone]   Fax - [phone] [facility] [address] [place], [place]  [address] Phone - [phone]   Fax - [phone]  [facility] [address] [place], [place]  [address] Phone - [phone]   Fax - [phone]  [facility] [address] [place], [place]  [address] Phone - [phone]   Fax - [phone]  [facility] [address] [place], [place]  [address] Phone - [phone]   Fax - [phone]  [facility] [address] [place], [place]  [address] Phone - [phone]  [facility] [address] [place], [place]  [address] Phone - [phone]   Fax - [phone]  [facility] [address] [place], [place]  [address] Phone - [phone]   Fax - [phone]  [facility] [address] [place], [place]  [address] Phone - [phone]   Fax - [phone]  [facility] [address] [place], [place]  [address] Phone - [phone]   Fax - [phone]   [facility] [address] [place], [place]  [address] Phone - [phone]   Fax - [phone]  [facility] [address] [place], [place]  [address] Phone - [phone]   Fax - [phone]  [facility] [address] [place], [place]  [address] Phone - [phone]   Fax - [phone]  [facility] [address] [place], [place]  [address] Phone - [phone]   Fax - [phone]  [facility] [address] , [place]  [address] Phone - [phone]   Fax - [phone] [facility] [address] [place], [place]  [address] Phone - [phone]   Fax - [phone]  [facility] [address] [place], [place]  [address] Phone - [phone]   Fax - [phone]  [facility] [address] [place],   [address] Phone - [phone]   Fax - [phone]  [facility] [place] [facility] Charlene Flemming MD [address] [place]  [address] Phone [phone]  Fax [phone]   

## 2017-10-03 NOTE — Progress Notes (Signed)
   PRENATAL VISIT NOTE  Subjective:  Lauren Conway is a 26 y.o. [id number] at 4553w2d being seen today for ongoing prenatal care.  She is currently monitored for the following issues for this low-risk pregnancy and has Supervision of other normal pregnancy, antepartum; HSV (herpes simplex virus) anogenital infection; Maternal varicella, non-immune; Substance abuse affecting pregnancy, antepartum; Pain of round ligament affecting pregnancy, antepartum; Anxiety; Asthma, chronic, mild intermittent, uncomplicated; Eczema; and Anemia affecting pregnancy in third trimester on her problem list.  Patient reports no bleeding, no leaking and occasional contractions.  Contractions: Irregular. Vag. Bleeding: None.  Movement: Present. Denies leaking of fluid.   The following portions of the patient's history were reviewed and updated as appropriate: allergies, current medications, past family history, past medical history, past social history, past surgical history and problem list. Problem list updated.  Objective:   Vitals:   10/03/17 1123  BP: 120/76  Pulse: 95  Weight: 199 lb 9.6 oz (90.5 kg)    Fetal Status: Fetal Heart Rate (bpm): 127; doppler Fundal Height: 36 cm Movement: Present     General:  Alert, oriented and cooperative. Patient is in no acute distress.  Skin: Skin is warm and dry. No rash noted.   Cardiovascular: Normal heart rate noted  Respiratory: Normal respiratory effort, no problems with respiration noted  Abdomen: Soft, gravid, appropriate for gestational age.  Pain/Pressure: Present     Pelvic: Cervical exam deferred        Extremities: Normal range of motion.  Edema: Mild pitting, slight indentation  Mental Status:  Normal mood and affect. Normal behavior. Normal judgment and thought content.   Assessment and Plan:  Pregnancy: [id number] at 5353w2d  1. Supervision of other normal pregnancy, antepartum      Doing well.  BTL planned postpartum.  TDaP today.   2. Maternal  varicella, non-immune      Varicella postpartum.   Preterm labor symptoms and general obstetric precautions including but not limited to vaginal bleeding, contractions, leaking of fluid and fetal movement were reviewed in detail with the patient. Please refer to After Visit Summary for other counseling recommendations.  Return in about 1 week (around 10/10/2017) for ROB.   Roe Coombsachelle A Dom [place], CNM

## 2017-10-05 ENCOUNTER — Inpatient Hospital Stay (HOSPITAL_COMMUNITY)
Admission: AD | Admit: 2017-10-05 | Discharge: 2017-10-05 | Disposition: A | Discharge status: Home / Self Care | Payer: Medicaid Other | Source: Ambulatory Visit | Attending: Obstetrics & Gynecology | Admitting: Obstetrics & Gynecology

## 2017-10-05 ENCOUNTER — Encounter (HOSPITAL_COMMUNITY): Payer: Self-pay

## 2017-10-05 DIAGNOSIS — F17210 Nicotine dependence, cigarettes, uncomplicated: Secondary | ICD-10-CM | POA: Diagnosis not present

## 2017-10-05 DIAGNOSIS — O479 False labor, unspecified: Secondary | ICD-10-CM

## 2017-10-05 DIAGNOSIS — Z3A34 34 weeks gestation of pregnancy: Secondary | ICD-10-CM | POA: Diagnosis not present

## 2017-10-05 DIAGNOSIS — O9989 Other specified diseases and conditions complicating pregnancy, childbirth and the puerperium: Secondary | ICD-10-CM

## 2017-10-05 DIAGNOSIS — O4703 False labor before 37 completed weeks of gestation, third trimester: Secondary | ICD-10-CM

## 2017-10-05 DIAGNOSIS — O99333 Smoking (tobacco) complicating pregnancy, third trimester: Secondary | ICD-10-CM | POA: Insufficient documentation

## 2017-10-05 DIAGNOSIS — O99891 Other specified diseases and conditions complicating pregnancy: Secondary | ICD-10-CM

## 2017-10-05 DIAGNOSIS — R102 Pelvic and perineal pain: Secondary | ICD-10-CM | POA: Diagnosis present

## 2017-10-05 DIAGNOSIS — M549 Dorsalgia, unspecified: Secondary | ICD-10-CM

## 2017-10-05 LAB — URINALYSIS, ROUTINE W REFLEX MICROSCOPIC
Bilirubin Urine: NEGATIVE
GLUCOSE, UA: NEGATIVE mg/dL
HGB URINE DIPSTICK: NEGATIVE
KETONES UR: NEGATIVE mg/dL
NITRITE: NEGATIVE
PH: 6 (ref 5.0–8.0)
PROTEIN: 30 mg/dL — AB
Specific Gravity, Urine: 1.015 (ref 1.005–1.030)

## 2017-10-05 MED ORDER — CYCLOBENZAPRINE HCL 10 MG PO TABS: 10.0000 mg | ORAL_TABLET | Freq: Two times a day (BID) | ORAL | 0 refills | Status: DC | PRN

## 2017-10-05 NOTE — MAU Note (Signed)
Reports pelvic pressure and pain since yesterday. Has been intermittent. Feeling some ctx in back and abdomen. No vag bleeding, no LOF. Some mucus discharge. +FM.

## 2017-10-05 NOTE — Discharge Instructions (Signed)
Third Trimester of Pregnancy The third trimester is from week 28 through week 40 (months 7 through 9). The third trimester is a time when the unborn baby (fetus) is growing rapidly. At the end of the ninth month, the fetus is about 20 inches in length and weighs 6-10 pounds. Body changes during your third trimester Your body will continue to go through many changes during pregnancy. The changes vary from woman to woman. During the third trimester:  Your weight will continue to increase. You can expect to gain 25-35 pounds (11-16 kg) by the end of the pregnancy.  You may begin to get stretch marks on your hips, abdomen, and breasts.  You may urinate more often because the fetus is moving lower into your pelvis and pressing on your bladder.  You may develop or continue to have heartburn. This is caused by increased hormones that slow down muscles in the digestive tract.  You may develop or continue to have constipation because increased hormones slow digestion and cause the muscles that push waste through your intestines to relax.  You may develop hemorrhoids. These are swollen veins (varicose veins) in the rectum that can itch or be painful.  You may develop swollen, bulging veins (varicose veins) in your legs.  You may have increased body aches in the pelvis, back, or thighs. This is due to weight gain and increased hormones that are relaxing your joints.  You may have changes in your hair. These can include thickening of your hair, rapid growth, and changes in texture. Some women also have hair loss during or after pregnancy, or hair that feels dry or thin. Your hair will most likely return to normal after your baby is born.  Your breasts will continue to grow and they will continue to become tender. A yellow fluid (colostrum) may leak from your breasts. This is the first milk you are producing for your baby.  Your belly button may stick out.  You may notice more swelling in your hands,  face, or ankles.  You may have increased tingling or numbness in your hands, arms, and legs. The skin on your belly may also feel numb.  You may feel short of breath because of your expanding uterus.  You may have more problems sleeping. This can be caused by the size of your belly, increased need to urinate, and an increase in your body's metabolism.  You may notice the fetus "dropping," or moving lower in your abdomen (lightening).  You may have increased vaginal discharge.  You may notice your joints feel loose and you may have pain around your pelvic bone.  What to expect at prenatal visits You will have prenatal exams every 2 weeks until week 36. Then you will have weekly prenatal exams. During a routine prenatal visit:  You will be weighed to make sure you and the baby are growing normally.  Your blood pressure will be taken.  Your abdomen will be measured to track your baby's growth.  The fetal heartbeat will be listened to.  Any test results from the previous visit will be discussed.  You may have a cervical check near your due date to see if your cervix has softened or thinned (effaced).  You will be tested for Group B streptococcus. This happens between 35 and 37 weeks.  Your health care provider may ask you:  What your birth plan is.  How you are feeling.  If you are feeling the baby move.  If you have had   any abnormal symptoms, such as leaking fluid, bleeding, severe headaches, or abdominal cramping.  If you are using any tobacco products, including cigarettes, chewing tobacco, and electronic cigarettes.  If you have any questions.  Other tests or screenings that may be performed during your third trimester include:  Blood tests that check for low iron levels (anemia).  Fetal testing to check the health, activity level, and growth of the fetus. Testing is done if you have certain medical conditions or if there are problems during the  pregnancy.  Nonstress test (NST). This test checks the health of your baby to make sure there are no signs of problems, such as the baby not getting enough oxygen. During this test, a belt is placed around your belly. The baby is made to move, and its heart rate is monitored during movement.  What is false labor? False labor is a condition in which you feel small, irregular tightenings of the muscles in the womb (contractions) that usually go away with rest, changing position, or drinking water. These are called Braxton Hicks contractions. Contractions may last for hours, days, or even weeks before true labor sets in. If contractions come at regular intervals, become more frequent, increase in intensity, or become painful, you should see your health care provider. What are the signs of labor?  Abdominal cramps.  Regular contractions that start at 10 minutes apart and become stronger and more frequent with time.  Contractions that start on the top of the uterus and spread down to the lower abdomen and back.  Increased pelvic pressure and dull back pain.  A watery or bloody mucus discharge that comes from the vagina.  Leaking of amniotic fluid. This is also known as your "water breaking." It could be a slow trickle or a gush. Let your health care provider know if it has a color or strange odor. If you have any of these signs, call your health care provider right away, even if it is before your due date. Follow these instructions at home: Medicines  Follow your health care provider's instructions regarding medicine use. Specific medicines may be either safe or unsafe to take during pregnancy.  Take a prenatal vitamin that contains at least 600 micrograms (mcg) of folic acid.  If you develop constipation, try taking a stool softener if your health care provider approves. Eating and drinking  Eat a balanced diet that includes fresh fruits and vegetables, whole grains, good sources of protein  such as meat, eggs, or tofu, and low-fat dairy. Your health care provider will help you determine the amount of weight gain that is right for you.  Avoid raw meat and uncooked cheese. These carry germs that can cause birth defects in the baby.  If you have low calcium intake from food, talk to your health care provider about whether you should take a daily calcium supplement.  Eat four or five small meals rather than three large meals a day.  Limit foods that are high in fat and processed sugars, such as fried and sweet foods.  To prevent constipation: ? Drink enough fluid to keep your urine clear or pale yellow. ? Eat foods that are high in fiber, such as fresh fruits and vegetables, whole grains, and beans. Activity  Exercise only as directed by your health care provider. Most women can continue their usual exercise routine during pregnancy. Try to exercise for 30 minutes at least 5 days a week. Stop exercising if you experience uterine contractions.  Avoid heavy   lifting.  Do not exercise in extreme heat or humidity, or at high altitudes.  Wear low-heel, comfortable shoes.  Practice good posture.  You may continue to have sex unless your health care provider tells you otherwise. Relieving pain and discomfort  Take frequent breaks and rest with your legs elevated if you have leg cramps or low back pain.  Take warm sitz baths to soothe any pain or discomfort caused by hemorrhoids. Use hemorrhoid cream if your health care provider approves.  Wear a good support bra to prevent discomfort from breast tenderness.  If you develop varicose veins: ? Wear support pantyhose or compression stockings as told by your healthcare provider. ? Elevate your feet for 15 minutes, 3-4 times a day. Prenatal care  Write down your questions. Take them to your prenatal visits.  Keep all your prenatal visits as told by your health care provider. This is important. Safety  Wear your seat belt at  all times when driving.  Make a list of emergency phone numbers, including numbers for family, friends, the hospital, and police and fire departments. General instructions  Avoid cat litter boxes and soil used by cats. These carry germs that can cause birth defects in the baby. If you have a cat, ask someone to clean the litter box for you.  Do not travel far distances unless it is absolutely necessary and only with the approval of your health care provider.  Do not use hot tubs, steam rooms, or saunas.  Do not drink alcohol.  Do not use any products that contain nicotine or tobacco, such as cigarettes and e-cigarettes. If you need help quitting, ask your health care provider.  Do not use any medicinal herbs or unprescribed drugs. These chemicals affect the formation and growth of the baby.  Do not douche or use tampons or scented sanitary pads.  Do not cross your legs for long periods of time.  To prepare for the arrival of your baby: ? Take prenatal classes to understand, practice, and ask questions about labor and delivery. ? Make a trial run to the hospital. ? Visit the hospital and tour the maternity area. ? Arrange for maternity or paternity leave through employers. ? Arrange for family and friends to take care of pets while you are in the hospital. ? Purchase a rear-facing car seat and make sure you know how to install it in your car. ? Pack your hospital bag. ? Prepare the baby's nursery. Make sure to remove all pillows and stuffed animals from the baby's crib to prevent suffocation.  Visit your dentist if you have not gone during your pregnancy. Use a soft toothbrush to brush your teeth and be gentle when you floss. Contact a health care provider if:  You are unsure if you are in labor or if your water has broken.  You become dizzy.  You have mild pelvic cramps, pelvic pressure, or nagging pain in your abdominal area.  You have lower back pain.  You have persistent  nausea, vomiting, or diarrhea.  You have an unusual or bad smelling vaginal discharge.  You have pain when you urinate. Get help right away if:  Your water breaks before 37 weeks.  You have regular contractions less than 5 minutes apart before 37 weeks.  You have a fever.  You are leaking fluid from your vagina.  You have spotting or bleeding from your vagina.  You have severe abdominal pain or cramping.  You have rapid weight loss or weight gain.    You have shortness of breath with chest pain.  You notice sudden or extreme swelling of your face, hands, ankles, feet, or legs.  Your baby makes fewer than 10 movements in 2 hours.  You have severe headaches that do not go away when you take medicine.  You have vision changes. Summary  The third trimester is from week 28 through week 40, months 7 through 9. The third trimester is a time when the unborn baby (fetus) is growing rapidly.  During the third trimester, your discomfort may increase as you and your baby continue to gain weight. You may have abdominal, leg, and back pain, sleeping problems, and an increased need to urinate.  During the third trimester your breasts will keep growing and they will continue to become tender. A yellow fluid (colostrum) may leak from your breasts. This is the first milk you are producing for your baby.  False labor is a condition in which you feel small, irregular tightenings of the muscles in the womb (contractions) that eventually go away. These are called Braxton Hicks contractions. Contractions may last for hours, days, or even weeks before true labor sets in.  Signs of labor can include: abdominal cramps; regular contractions that start at 10 minutes apart and become stronger and more frequent with time; watery or bloody mucus discharge that comes from the vagina; increased pelvic pressure and dull back pain; and leaking of amniotic fluid. This information is not intended to replace advice  given to you by your health care provider. Make sure you discuss any questions you have with your health care provider. Document Released: 11/26/2001 Document Revised: 05/09/2016 Document Reviewed: 02/02/2013 [employer] Interactive Patient Education  2017 [employer]  

## 2017-10-05 NOTE — MAU Provider Note (Signed)
History     CSN: [id number]  Arrival date and time: 10/05/17 7829   First Provider Initiated Contact with Patient 10/05/17 [id number]      Chief Complaint  Patient presents with  . Pelvic Pain   Lauren Conway is a 26 y.o. [id number] at [redacted]w[redacted]d who presents today with contractions, pelvic pain and back pain since yesterday. She states that she may have been having 6 ctx per hour for most of the day yesterday. She rested, took a hot bath, and she was able to sleep last night. When she woke up this morning the pain had returned. She has had preterm contractions with her pregnancy, but has had full term deliveries. She denies any VB or LOF. She reports normal fetal movement.    Pelvic Pain  The patient's primary symptoms include pelvic pain. The patient's pertinent negatives include no vaginal discharge. This is a new problem. The current episode started yesterday. The problem occurs intermittently. The problem has been unchanged. Pain severity now: 7/10. The problem affects both sides. She is pregnant. Associated symptoms include back pain, frequency and nausea. Pertinent negatives include no chills, dysuria, fever, urgency or vomiting. The vaginal discharge was normal. There has been no bleeding. Nothing aggravates the symptoms. She has tried warm baths for the symptoms. The treatment provided mild relief. Sexual activity: No intercourse in the last 48 hours.     Past Medical History:  Diagnosis Date  . Anemia   . Anxiety   . Asthma    uses inhaler, last used 8/22  . Depression   . Eczema   . History of chlamydia    12/2008; 03/09/2009  . History of gonorrhea    12/2008; 09/2010;   . 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 PID (pelvic inflammatory disease)     Past Surgical History:  Procedure Laterality Date  . NO PAST SURGERIES      Family History  Problem Relation Age of Onset  . Hypertension Mother   . Depression Mother   . Breast cancer Paternal  Grandmother   . Cancer Paternal Grandmother        breast  . Diabetes Paternal Grandmother   . Asthma Maternal Grandmother   . Other Neg Hx     Social History  Substance Use Topics  . Smoking status: Current Some Day Smoker    Packs/day: 0.00    Years: 0.50    Types: Cigarettes    Last attempt to quit: 03/16/2017  . Smokeless tobacco: Never Used  . Alcohol use No     Comment: not since pregnancy    Allergies:  Allergies  Allergen Reactions  . Eggs Or Egg-Derived Products Hives and Swelling  . Latex Itching and Swelling    Prescriptions Prior to Admission  Medication Sig Dispense Refill Last Dose  . acetaminophen (TYLENOL) 500 MG tablet Take 1,000 mg by mouth every 6 (six) hours as needed for moderate pain.    Past Week at Unknown time  . albuterol (PROVENTIL HFA;VENTOLIN HFA) 108 (90 Base) MCG/ACT inhaler Inhale 1-2 puffs into the lungs every 6 (six) hours as needed for wheezing or shortness of breath. 1 Inhaler 2 10/04/2017 at Unknown time  . Doxylamine-Pyridoxine ER (BONJESTA) 20-20 MG TBCR Take 1 tablet by mouth 2 (two) times daily. (Patient taking differently: Take 1 tablet by mouth 2 (two) times daily as needed (nausea and vomiting). ) 60 tablet 6 10/04/2017 at Unknown time  . famotidine (PEPCID) 20 MG tablet  Take 1 tablet (20 mg total) by mouth 2 (two) times daily. 60 tablet 2 10/05/2017 at Unknown time  . fluticasone (FLOVENT HFA) 110 MCG/ACT inhaler Inhale 2 puffs into the lungs daily.    10/04/2017 at Unknown time  . Prenatal-DSS-FeCb-FeGl-FA (CITRANATAL BLOOM) 90-1 MG TABS Take 1 tablet by mouth daily. 30 tablet 12 10/04/2017 at Unknown time  . sertraline (ZOLOFT) 25 MG tablet Take 2 tablets (50 mg total) by mouth daily. 60 tablet 5 10/05/2017 at Unknown time  . triamcinolone ointment (KENALOG) 0.5 % Apply 1 application topically 2 (two) times daily. 30 g 0 10/05/2017 at Unknown time  . valACYclovir (VALTREX) 500 MG tablet TAKE 1 TABLET BY MOUTH EVERY DAY TAKE TWICE A DAY  FOR 3 DAYS FOR FLARE (Patient taking differently: TAKE 1 TABLET (500 MG) BY MOUTH EVERY DAY TAKE TWICE A DAY FOR 3 DAYS FOR FLARE) 36 tablet 6 Past Week at Unknown time  . amoxicillin-clavulanate (AUGMENTIN) 875-125 MG tablet Take 1 tablet by mouth 2 (two) times daily. (Patient not taking: Reported on 10/05/2017) 14 tablet 0 Not Taking at Unknown time  . benzonatate (TESSALON PERLES) 100 MG capsule Take 1 capsule (100 mg total) by mouth 3 (three) times daily as needed for cough. (Patient not taking: Reported on 10/05/2017) 30 capsule 0 Not Taking at Unknown time  . Elastic Bandages & Supports (COMFORT FIT MATERNITY SUPP LG) MISC 1 Units by Does not apply route daily. 1 each 0 Taking  . fluticasone (FLONASE) 50 MCG/ACT nasal spray Place 2 sprays into both nostrils daily. (Patient not taking: Reported on 10/05/2017) 16 g 2 Not Taking at Unknown time    Review of Systems  Constitutional: Negative for chills and fever.  Gastrointestinal: Positive for nausea. Negative for vomiting.  Genitourinary: Positive for frequency and pelvic pain. Negative for dysuria, urgency, vaginal bleeding and vaginal discharge.  Musculoskeletal: Positive for back pain.   Physical Exam   Blood pressure 125/73, pulse 94, temperature 98.2 F (36.8 C), temperature source Oral, resp. rate 20, last menstrual period 12/04/2016.  Physical Exam  Nursing note and vitals reviewed. Constitutional: She is oriented to person, place, and time. She appears well-developed and well-nourished. No distress.  HENT:  Head: Normocephalic.  Cardiovascular: Normal rate.   Respiratory: Effort normal.  GI: Soft. There is no tenderness. There is no rebound.  Genitourinary:  Genitourinary Comments: External: no lesion  Cervix: 1 at ext os/closed at int. Os/thick/ballotable.   Neurological: She is alert and oriented to person, place, and time.  Skin: Skin is warm and dry.  Psychiatric: She has a normal mood and affect.   Results for orders  placed or performed during the hospital encounter of 10/05/17 (from the past 24 hour(s))  Urinalysis, Routine w reflex microscopic     Status: Abnormal   Collection Time: 10/05/17  8:00 AM  Result Value Ref Range   Color, Urine YELLOW YELLOW   APPearance HAZY (A) CLEAR   Specific Gravity, Urine 1.015 1.005 - 1.030   pH 6.0 5.0 - 8.0   Glucose, UA NEGATIVE NEGATIVE mg/dL   Hgb urine dipstick NEGATIVE NEGATIVE   Bilirubin Urine NEGATIVE NEGATIVE   Ketones, ur NEGATIVE NEGATIVE mg/dL   Protein, ur 30 (A) NEGATIVE mg/dL   Nitrite NEGATIVE NEGATIVE   Leukocytes, UA TRACE (A) NEGATIVE   RBC / HPF 0-5 0 - 5 RBC/hpf   WBC, UA 0-5 0 - 5 WBC/hpf   Bacteria, UA MANY (A) NONE SEEN   Squamous Epithelial / LPF  0-5 (A) NONE SEEN   Mucus PRESENT    FHT: 120, moderate with 15x15 accels, no decels Toco: occasional UC, and some UI. No regular pattern.   MAU Course  Procedures  MDM   Assessment and Plan   1. Threatened premature labor in third trimester   2. Braxton Hicks contractions   3. Back pain affecting pregnancy in third trimester   4. [redacted] weeks gestation of pregnancy    DC home Comfort measures reviewed  3rd Trimester precautions  PTL precautions  Fetal kick counts RX: flexeril PRN #20  Return to MAU as needed FU with OB as planned  Follow-up Information    [facility] [place] Follow up.   Specialty:  Obstetrics and Gynecology Contact information: [address] [place] [address] [phone]           Thressa Sheller 10/05/2017, 9:02 AM

## 2017-10-06 LAB — CULTURE, OB URINE: Culture: NO GROWTH

## 2017-10-13 ENCOUNTER — Ambulatory Visit (INDEPENDENT_AMBULATORY_CARE_PROVIDER_SITE_OTHER): Payer: Medicaid Other | Admitting: Certified Nurse Midwife

## 2017-10-13 ENCOUNTER — Other Ambulatory Visit (HOSPITAL_COMMUNITY)
Admission: RE | Admit: 2017-10-13 | Discharge: 2017-10-13 | Disposition: A | Discharge status: Home / Self Care | Payer: Medicaid Other | Source: Ambulatory Visit | Attending: Certified Nurse Midwife | Admitting: Certified Nurse Midwife

## 2017-10-13 ENCOUNTER — Encounter: Payer: Medicaid Other | Admitting: Certified Nurse Midwife

## 2017-10-13 VITALS — BP 140/74 | HR 102 | Wt 204.2 lb

## 2017-10-13 DIAGNOSIS — O09899 Supervision of other high risk pregnancies, unspecified trimester: Secondary | ICD-10-CM

## 2017-10-13 DIAGNOSIS — Z283 Underimmunization status: Secondary | ICD-10-CM

## 2017-10-13 DIAGNOSIS — Z3483 Encounter for supervision of other normal pregnancy, third trimester: Secondary | ICD-10-CM

## 2017-10-13 DIAGNOSIS — O09893 Supervision of other high risk pregnancies, third trimester: Secondary | ICD-10-CM

## 2017-10-13 DIAGNOSIS — Z3A00 Weeks of gestation of pregnancy not specified: Secondary | ICD-10-CM | POA: Diagnosis not present

## 2017-10-13 DIAGNOSIS — Z3480 Encounter for supervision of other normal pregnancy, unspecified trimester: Secondary | ICD-10-CM | POA: Insufficient documentation

## 2017-10-13 DIAGNOSIS — O99013 Anemia complicating pregnancy, third trimester: Secondary | ICD-10-CM

## 2017-10-13 DIAGNOSIS — Z2839 Other underimmunization status: Secondary | ICD-10-CM

## 2017-10-13 DIAGNOSIS — B009 Herpesviral infection, unspecified: Secondary | ICD-10-CM

## 2017-10-13 DIAGNOSIS — O163 Unspecified maternal hypertension, third trimester: Secondary | ICD-10-CM

## 2017-10-13 MED ORDER — VALACYCLOVIR HCL 500 MG PO TABS: ORAL_TABLET | ORAL | 2 refills | Status: DC

## 2017-10-13 NOTE — Progress Notes (Signed)
ROB/GBS. Complains of swelling in hands and feet

## 2017-10-13 NOTE — Progress Notes (Signed)
   PRENATAL VISIT NOTE  Subjective:  Lauren Conway is a 26 y.o. [id number] at 37w5dbeing seen today for ongoing prenatal care.  She is currently monitored for the following issues for this low-risk pregnancy and has Supervision of other normal pregnancy, antepartum; HSV (herpes simplex virus) anogenital infection; Maternal varicella, non-immune; Substance abuse affecting pregnancy, antepartum; Pain of round ligament affecting pregnancy, antepartum; Anxiety; Asthma, chronic, mild intermittent, uncomplicated; Eczema; and Anemia affecting pregnancy in third trimester on her problem list.  Patient reports no bleeding, no contractions, no cramping, no leaking and edema in hands and feet, no HA, new onset.  Contractions: Irritability. Vag. Bleeding: None.  Movement: Present. Denies leaking of fluid.   The following portions of the patient's history were reviewed and updated as appropriate: allergies, current medications, past family history, past medical history, past social history, past surgical history and problem list. Problem list updated.  Objective:   Vitals:   10/13/17 1443  BP: 140/74  Pulse: (!) 102  Weight: 204 lb 3.2 oz (92.6 kg)    Fetal Status: Fetal Heart Rate (bpm): 140; doppler Fundal Height: 37 cm Movement: Present  Presentation: Vertex  General:  Alert, oriented and cooperative. Patient is in no acute distress.  Skin: Skin is warm and dry. No rash noted.   Cardiovascular: Normal heart rate noted  Respiratory: Normal respiratory effort, no problems with respiration noted  Abdomen: Soft, gravid, appropriate for gestational age.  Pain/Pressure: Present     Pelvic: Cervical exam performed Dilation: 1 Effacement (%): Thick Station: Ballotable  Extremities: Normal range of motion.  Edema: Mild pitting, slight indentation  Mental Status:  Normal mood and affect. Normal behavior. Normal judgment and thought content.   Assessment and Plan:  Pregnancy: [id number] 318w5d1.  Herpes     Prophylaxis ordered - valACYclovir (VALTREX) 500 MG tablet; TAKE 1 TABLET (500 MG) BY MOUTH EVERY DAY.  Dispense: 30 tablet; Refill: 2  2. Supervision of other normal pregnancy, antepartum       - Cervicovaginal ancillary only - Strep Gp B NAA  3. Anemia affecting pregnancy in third trimester     Taking Bloom  4. Elevated blood pressure affecting pregnancy in third trimester, antepartum      140, usually normotensive could be d/t stress, no hx of Pre-E - CBC - Comp Met (CMET) - Protein / creatinine ratio, urine  5. Maternal varicella, non-immune     Varicella postpartum  Preterm labor symptoms and general obstetric precautions including but not limited to vaginal bleeding, contractions, leaking of fluid and fetal movement were reviewed in detail with the patient. Please refer to After Visit Summary for other counseling recommendations.  Return in about 1 week (around 10/20/2017) for ROB, blood pressure check Wednesday.   RaMorene CrockerCNM

## 2017-10-14 ENCOUNTER — Other Ambulatory Visit: Payer: Self-pay | Admitting: Certified Nurse Midwife

## 2017-10-14 LAB — COMPREHENSIVE METABOLIC PANEL
ALBUMIN: 3 g/dL — AB (ref 3.5–5.5)
ALK PHOS: 202 IU/L — AB (ref 39–117)
ALT: 14 IU/L (ref 0–32)
AST: 24 IU/L (ref 0–40)
Albumin/Globulin Ratio: 1 — ABNORMAL LOW (ref 1.2–2.2)
BILIRUBIN TOTAL: 0.4 mg/dL (ref 0.0–1.2)
BUN / CREAT RATIO: 8 — AB (ref 9–23)
BUN: 4 mg/dL — AB (ref 6–20)
CHLORIDE: 102 mmol/L (ref 96–106)
CO2: 21 mmol/L (ref 20–29)
CREATININE: 0.48 mg/dL — AB (ref 0.57–1.00)
Calcium: 8.8 mg/dL (ref 8.7–10.2)
GFR calc Af Amer: 158 mL/min/{1.73_m2} (ref >59)
GFR calc non Af Amer: 137 mL/min/{1.73_m2} (ref >59)
GLUCOSE: 87 mg/dL (ref 65–99)
Globulin, Total: 2.9 g/dL (ref 1.5–4.5)
Potassium: 3.9 mmol/L (ref 3.5–5.2)
Sodium: 138 mmol/L (ref 134–144)
Total Protein: 5.9 g/dL — ABNORMAL LOW (ref 6.0–8.5)

## 2017-10-14 LAB — PROTEIN / CREATININE RATIO, URINE
CREATININE, UR: 66.5 mg/dL
PROTEIN UR: 10.4 mg/dL
Protein/Creat Ratio: 156 mg/g creat (ref 0–200)

## 2017-10-14 LAB — CBC
HEMOGLOBIN: 8.7 g/dL — AB (ref 11.1–15.9)
Hematocrit: 26.5 % — ABNORMAL LOW (ref 34.0–46.6)
MCH: 27 pg (ref 26.6–33.0)
MCHC: 32.8 g/dL (ref 31.5–35.7)
MCV: 82 fL (ref 79–97)
PLATELETS: 313 10*3/uL (ref 150–379)
RBC: 3.22 x10E6/uL — ABNORMAL LOW (ref 3.77–5.28)
RDW: 14.1 % (ref 12.3–15.4)
WBC: 8.6 10*3/uL (ref 3.4–10.8)

## 2017-10-14 LAB — OB RESULTS CONSOLE GBS: STREP GROUP B AG: NEGATIVE

## 2017-10-14 LAB — CERVICOVAGINAL ANCILLARY ONLY
Chlamydia: NEGATIVE
Neisseria Gonorrhea: NEGATIVE

## 2017-10-15 ENCOUNTER — Ambulatory Visit (INDEPENDENT_AMBULATORY_CARE_PROVIDER_SITE_OTHER): Payer: Medicaid Other | Admitting: *Deleted

## 2017-10-15 ENCOUNTER — Encounter: Payer: Self-pay | Admitting: *Deleted

## 2017-10-15 VITALS — BP 117/76 | HR 114

## 2017-10-15 DIAGNOSIS — O99013 Anemia complicating pregnancy, third trimester: Secondary | ICD-10-CM

## 2017-10-15 DIAGNOSIS — Z3480 Encounter for supervision of other normal pregnancy, unspecified trimester: Secondary | ICD-10-CM

## 2017-10-15 DIAGNOSIS — D649 Anemia, unspecified: Secondary | ICD-10-CM

## 2017-10-15 DIAGNOSIS — Z3483 Encounter for supervision of other normal pregnancy, third trimester: Secondary | ICD-10-CM

## 2017-10-15 LAB — STREP GP B NAA: Strep Gp B NAA: NEGATIVE

## 2017-10-15 MED ORDER — CITRANATAL BLOOM 90-1 MG PO TABS: 1.0000 | ORAL_TABLET | Freq: Every day | ORAL | 12 refills | Status: DC

## 2017-10-15 NOTE — Progress Notes (Signed)
Agree with nursing staff's documentation of this patient's clinic encounter.  Rachelle A Denney, CNM    

## 2017-10-15 NOTE — Progress Notes (Signed)
Pt is in office today for BP check. Pt had elevated BP at last visit.   BP in office is reassuring.   Reviewed BP with R.Denney, CNM- pt approved to return to work and keep appt next week. Letter was composed at pt request to cover missing days of work since last appt. Pt ask about letter to cover other missing days from work.  Pt advised if she was seen in office for those days a letter can be given. Pt advised to get dates missing from work and discuss at next appt.  Pt states understanding. Pt has no other concerns today.  BP 117/76   Pulse (!) 114   LMP 12/04/2016 (LMP Unknown)

## 2017-10-16 ENCOUNTER — Other Ambulatory Visit: Payer: Self-pay | Admitting: Certified Nurse Midwife

## 2017-10-16 ENCOUNTER — Encounter (HOSPITAL_COMMUNITY): Payer: Self-pay

## 2017-10-16 ENCOUNTER — Inpatient Hospital Stay (HOSPITAL_COMMUNITY)
Admission: AD | Admit: 2017-10-16 | Discharge: 2017-10-17 | Disposition: A | Discharge status: Home / Self Care | Payer: Medicaid Other | Source: Ambulatory Visit | Attending: Obstetrics and Gynecology | Admitting: Obstetrics and Gynecology

## 2017-10-16 DIAGNOSIS — M545 Low back pain: Secondary | ICD-10-CM | POA: Insufficient documentation

## 2017-10-16 DIAGNOSIS — O163 Unspecified maternal hypertension, third trimester: Secondary | ICD-10-CM | POA: Diagnosis present

## 2017-10-16 DIAGNOSIS — O99013 Anemia complicating pregnancy, third trimester: Secondary | ICD-10-CM | POA: Diagnosis not present

## 2017-10-16 DIAGNOSIS — J45909 Unspecified asthma, uncomplicated: Secondary | ICD-10-CM | POA: Diagnosis not present

## 2017-10-16 DIAGNOSIS — Z3A36 36 weeks gestation of pregnancy: Secondary | ICD-10-CM | POA: Insufficient documentation

## 2017-10-16 DIAGNOSIS — O99513 Diseases of the respiratory system complicating pregnancy, third trimester: Secondary | ICD-10-CM | POA: Insufficient documentation

## 2017-10-16 DIAGNOSIS — D509 Iron deficiency anemia, unspecified: Secondary | ICD-10-CM | POA: Diagnosis not present

## 2017-10-16 DIAGNOSIS — O99343 Other mental disorders complicating pregnancy, third trimester: Secondary | ICD-10-CM | POA: Insufficient documentation

## 2017-10-16 DIAGNOSIS — O9989 Other specified diseases and conditions complicating pregnancy, childbirth and the puerperium: Secondary | ICD-10-CM | POA: Diagnosis not present

## 2017-10-16 DIAGNOSIS — F329 Major depressive disorder, single episode, unspecified: Secondary | ICD-10-CM | POA: Diagnosis not present

## 2017-10-16 DIAGNOSIS — F419 Anxiety disorder, unspecified: Secondary | ICD-10-CM | POA: Diagnosis not present

## 2017-10-16 DIAGNOSIS — Z79899 Other long term (current) drug therapy: Secondary | ICD-10-CM | POA: Insufficient documentation

## 2017-10-16 DIAGNOSIS — R51 Headache: Secondary | ICD-10-CM | POA: Insufficient documentation

## 2017-10-16 DIAGNOSIS — O99333 Smoking (tobacco) complicating pregnancy, third trimester: Secondary | ICD-10-CM | POA: Diagnosis not present

## 2017-10-16 DIAGNOSIS — F17210 Nicotine dependence, cigarettes, uncomplicated: Secondary | ICD-10-CM | POA: Insufficient documentation

## 2017-10-16 DIAGNOSIS — O26893 Other specified pregnancy related conditions, third trimester: Secondary | ICD-10-CM | POA: Insufficient documentation

## 2017-10-16 DIAGNOSIS — G44209 Tension-type headache, unspecified, not intractable: Secondary | ICD-10-CM

## 2017-10-16 LAB — URINALYSIS, ROUTINE W REFLEX MICROSCOPIC
BILIRUBIN URINE: NEGATIVE
GLUCOSE, UA: NEGATIVE mg/dL
HGB URINE DIPSTICK: NEGATIVE
Ketones, ur: NEGATIVE mg/dL
Leukocytes, UA: NEGATIVE
Nitrite: NEGATIVE
PH: 5 (ref 5.0–8.0)
Protein, ur: NEGATIVE mg/dL
SPECIFIC GRAVITY, URINE: 1.018 (ref 1.005–1.030)

## 2017-10-16 LAB — IRON AND TIBC
IRON SATURATION: 8 % — AB (ref 15–55)
IRON: 42 ug/dL (ref 27–159)
TIBC: 560 ug/dL — AB (ref 250–450)
UIBC: 518 ug/dL — AB (ref 131–425)

## 2017-10-16 LAB — SPECIMEN STATUS REPORT

## 2017-10-16 LAB — FERRITIN: Ferritin: 11 ng/mL — ABNORMAL LOW (ref 15–150)

## 2017-10-16 MED ORDER — BUTALBITAL-APAP-CAFFEINE 50-325-40 MG PO TABS
1.0000 | ORAL_TABLET | Freq: Once | ORAL | Status: AC
  Administered 2017-10-17: 1 via ORAL
  Filled 2017-10-16: qty 1

## 2017-10-16 NOTE — MAU Note (Signed)
PT c/o HA that started tonight. States she took tylenol and it has helped some. States she has been monitored for HBP in the office. Did not take BP tonight-unable to find BP cuff. Pt also reports seeing some spots.

## 2017-10-16 NOTE — MAU Provider Note (Signed)
Chief Complaint:  Hypertension; Back Pain; and Headache   First Provider Initiated Contact with Patient 10/16/17 2340     HPI: Lauren Conway is a 26 y.o. [id number] at 76w1dwho presents to maternity admissions reporting headache which started this evening.  Did get some relief from Tylenol at 4pm.   States has high BP but office notes show only one episode of 140 systolic with no elevated diastolics.  . She reports good fetal movement, denies LOF, vaginal bleeding, vaginal itching/burning, urinary symptoms, h/a, dizziness, n/v, diarrhea, constipation or fever/chills.  She denies headache, visual changes or RUQ abdominal pain.  Hypertension  The current episode started 1 to 4 weeks ago. The problem has been resolved since onset. Associated symptoms include headaches and peripheral edema. Pertinent negatives include no anxiety, blurred vision, malaise/fatigue or neck pain. There are no associated agents to hypertension. There are no known risk factors for coronary artery disease. Past treatments include nothing.  Back Pain  This is a recurrent problem. The current episode started today. The problem occurs intermittently. The problem is unchanged. The pain is present in the lumbar spine. The quality of the pain is described as aching. The pain is the same all the time. Stiffness is present all day. Associated symptoms include abdominal pain, headaches and pelvic pain. Pertinent negatives include no dysuria, fever, numbness, tingling or weakness. She has tried analgesics for the symptoms. The treatment provided no relief.  Headache   This is a recurrent problem. The current episode started today. The problem occurs intermittently. The problem has been gradually improving. The pain does not radiate. The pain quality is similar to prior headaches. The quality of the pain is described as aching. Associated symptoms include abdominal pain and back pain. Pertinent negatives include no blurred vision, fever,  muscle aches, nausea, neck pain, numbness, photophobia, sinus pressure, tingling, visual change or weakness. Nothing aggravates the symptoms. She has tried acetaminophen for the symptoms. The treatment provided moderate relief. Her past medical history is significant for hypertension.    RN Note: PT c/o HA that started tonight. States she took tylenol and it has helped some. States she has been monitored for HBP in the office. Did not take BP tonight-unable to find BP cuff. Pt also reports seeing some spots.     Electronically signed by Brand Males, RN at 11     Past Medical History: Past Medical History:  Diagnosis Date  . Anemia   . Anxiety   . Asthma    uses inhaler, last used 8/22  . Depression   . Eczema   . History of chlamydia    12/2008; 03/09/2009  . History of gonorrhea    12/2008; 09/2010;   . 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 PID (pelvic inflammatory disease)     Past obstetric history: OB History  Gravida Para Term Preterm AB Living  5 2 2   2 2   SAB TAB Ectopic Multiple Live Births  2     0 2    # Outcome Date GA Lbr Len/2nd Weight Sex Delivery Anes PTL Lv  5 Current           4 SAB 01/2017 [redacted]w[redacted]d            Birth Comments: no complication  3 Term 08/09/15 [redacted]w[redacted]d 51:15 / 00:18 7 lb 11.6 oz (3.504 kg) M Vag-Spont EPI  LIV  2 Term 03/19/13 [redacted]w[redacted]d 14:40 / 00:34 6 lb 5.6 oz (2.88 kg)  F Vag-Spont EPI  LIV     Birth Comments: umbilical hernia  1 SAB 2013 8173w0d             Past Surgical History: Past Surgical History:  Procedure Laterality Date  . NO PAST SURGERIES      Family History: Family History  Problem Relation Age of Onset  . Hypertension Mother   . Depression Mother   . Breast cancer Paternal Grandmother   . Cancer Paternal Grandmother        breast  . Diabetes Paternal Grandmother   . Asthma Maternal Grandmother   . Other Neg Hx     Social History: Social History  Substance Use Topics  . Smoking status:  Current Some Day Smoker    Packs/day: 0.00    Years: 0.50    Types: Cigarettes    Last attempt to quit: 03/16/2017  . Smokeless tobacco: Never Used  . Alcohol use No     Comment: not since pregnancy    Allergies:  Allergies  Allergen Reactions  . Eggs Or Egg-Derived Products Hives and Swelling  . Latex Itching and Swelling    Meds:  Prescriptions Prior to Admission  Medication Sig Dispense Refill Last Dose  . acetaminophen (TYLENOL) 500 MG tablet Take 1,000 mg by mouth every 6 (six) hours as needed for moderate pain.    Past Week at Unknown time  . albuterol (PROVENTIL HFA;VENTOLIN HFA) 108 (90 Base) MCG/ACT inhaler Inhale 1-2 puffs into the lungs every 6 (six) hours as needed for wheezing or shortness of breath. 1 Inhaler 2 10/04/2017 at Unknown time  . cyclobenzaprine (FLEXERIL) 10 MG tablet Take 1 tablet (10 mg total) by mouth 2 (two) times daily as needed for muscle spasms. 20 tablet 0   . Doxylamine-Pyridoxine ER (BONJESTA) 20-20 MG TBCR Take 1 tablet by mouth 2 (two) times daily. (Patient taking differently: Take 1 tablet by mouth 2 (two) times daily as needed (nausea and vomiting). ) 60 tablet 6 10/04/2017 at Unknown time  . Elastic Bandages & Supports (COMFORT FIT MATERNITY SUPP LG) MISC 1 Units by Does not apply route daily. 1 each 0 Taking  . famotidine (PEPCID) 20 MG tablet Take 1 tablet (20 mg total) by mouth 2 (two) times daily. 60 tablet 2 10/05/2017 at Unknown time  . fluticasone (FLOVENT HFA) 110 MCG/ACT inhaler Inhale 2 puffs into the lungs daily.    10/04/2017 at Unknown time  . Prenatal-DSS-FeCb-FeGl-FA (CITRANATAL BLOOM) 90-1 MG TABS Take 1 tablet by mouth daily. 30 tablet 12   . sertraline (ZOLOFT) 25 MG tablet Take 2 tablets (50 mg total) by mouth daily. 60 tablet 5 10/05/2017 at Unknown time  . triamcinolone ointment (KENALOG) 0.5 % Apply 1 application topically 2 (two) times daily. 30 g 0 10/05/2017 at Unknown time  . valACYclovir (VALTREX) 500 MG tablet TAKE 1  TABLET (500 MG) BY MOUTH EVERY DAY. 30 tablet 2     I have reviewed patient's Past Medical Hx, Surgical Hx, Family Hx, Social Hx, medications and allergies.   ROS:  Review of Systems  Constitutional: Negative for fever and malaise/fatigue.  HENT: Negative for sinus pressure.   Eyes: Negative for blurred vision and photophobia.  Gastrointestinal: Positive for abdominal pain. Negative for nausea.  Genitourinary: Positive for pelvic pain. Negative for dysuria.  Musculoskeletal: Positive for back pain. Negative for neck pain.  Neurological: Positive for headaches. Negative for tingling, weakness and numbness.   Other systems negative  Physical Exam  Patient Vitals for  the past 24 hrs:  BP Temp Temp src Pulse Resp  10/16/17 2333 129/70 98.2 F (36.8 C) Oral 97 18   Vitals:   10/17/17 0031 10/17/17 0046 10/17/17 0101 10/17/17 0131  BP: 129/77 131/75 (!) 117/59 125/71  Pulse: 91 87 91 84  Resp:      Temp:      TempSrc:        Constitutional: Well-developed, well-nourished female in no acute distress.  Cardiovascular: normal rate and rhythm Respiratory: normal effort, clear to auscultation bilaterally GI: Abd soft, non-tender, gravid appropriate for gestational age.   No rebound or guarding. MS: Extremities nontender, no edema, normal ROM Neurologic: Alert and oriented x 4.  GU: Neg CVAT.  PELVIC EXAM: deferred    FHT:  Baseline 140 , moderate variability, accelerations present, no decelerations Contractions:  Irregular     Labs: . Results for orders placed or performed during the hospital encounter of 10/16/17 (from the past 24 hour(s))  Urinalysis, Routine w reflex microscopic     Status: Abnormal   Collection Time: 10/16/17 11:17 PM  Result Value Ref Range   Color, Urine YELLOW YELLOW   APPearance HAZY (A) CLEAR   Specific Gravity, Urine 1.018 1.005 - 1.030   pH 5.0 5.0 - 8.0   Glucose, UA NEGATIVE NEGATIVE mg/dL   Hgb urine dipstick NEGATIVE NEGATIVE   Bilirubin  Urine NEGATIVE NEGATIVE   Ketones, ur NEGATIVE NEGATIVE mg/dL   Protein, ur NEGATIVE NEGATIVE mg/dL   Nitrite NEGATIVE NEGATIVE   Leukocytes, UA NEGATIVE NEGATIVE    O/Positive/-- (05/17 1710)  Imaging:  No results found.  MAU Course/MDM: I have ordered labs and reviewed results. Urine clear NST reviewed, reactive.  Treatments in MAU included Fioricet x 1 which partially relieved H/A.  Second tablet got it down to a "3".   BP's all within normal range Assessment: Single IUP at [redacted]w[redacted]d Headache, mostly resolved No evidence of hypertension  Plan: Discharge home Labor precautions and fetal kick counts Follow up in Office for prenatal visits and recheck of status  Encouraged to return here or to other Urgent Care/ED if she develops worsening of symptoms, increase in pain, fever, or other concerning symptoms.   Pt stable at time of discharge.  Wynelle Bourgeois CNM, MSN Certified Nurse-Midwife 10/16/2017 11:40 PM

## 2017-10-17 ENCOUNTER — Inpatient Hospital Stay (EMERGENCY_DEPARTMENT_HOSPITAL)
Admission: AD | Admit: 2017-10-17 | Discharge: 2017-10-17 | Disposition: A | Discharge status: Home / Self Care | Payer: Medicaid Other | Source: Ambulatory Visit | Attending: Obstetrics & Gynecology | Admitting: Obstetrics & Gynecology

## 2017-10-17 ENCOUNTER — Telehealth: Payer: Self-pay | Admitting: Pediatrics

## 2017-10-17 ENCOUNTER — Encounter (HOSPITAL_COMMUNITY): Payer: Self-pay

## 2017-10-17 ENCOUNTER — Other Ambulatory Visit: Payer: Self-pay | Admitting: Certified Nurse Midwife

## 2017-10-17 DIAGNOSIS — O99013 Anemia complicating pregnancy, third trimester: Secondary | ICD-10-CM

## 2017-10-17 DIAGNOSIS — O9989 Other specified diseases and conditions complicating pregnancy, childbirth and the puerperium: Secondary | ICD-10-CM

## 2017-10-17 DIAGNOSIS — O99019 Anemia complicating pregnancy, unspecified trimester: Principal | ICD-10-CM

## 2017-10-17 DIAGNOSIS — D509 Iron deficiency anemia, unspecified: Secondary | ICD-10-CM

## 2017-10-17 MED ORDER — SODIUM CHLORIDE 0.9 % IV SOLN
INTRAVENOUS | Status: DC
  Administered 2017-10-17: 16:00:00 via INTRAVENOUS

## 2017-10-17 MED ORDER — BUTALBITAL-APAP-CAFFEINE 50-325-40 MG PO CAPS: 1.0000 | ORAL_CAPSULE | Freq: Four times a day (QID) | ORAL | 0 refills | Status: DC | PRN

## 2017-10-17 MED ORDER — BUTALBITAL-APAP-CAFFEINE 50-325-40 MG PO TABS
1.0000 | ORAL_TABLET | Freq: Once | ORAL | Status: AC
  Administered 2017-10-17: 1 via ORAL
  Filled 2017-10-17: qty 1

## 2017-10-17 MED ORDER — SODIUM CHLORIDE 0.9 % IV SOLN
510.0000 mg | INTRAVENOUS | Status: DC
  Administered 2017-10-17: 510 mg via INTRAVENOUS
  Filled 2017-10-17: qty 17

## 2017-10-17 NOTE — Progress Notes (Signed)
Pt rec'd d/c instructions & denies ques/concerns.  Pt d/c home in stable condition.

## 2017-10-17 NOTE — Telephone Encounter (Signed)
-----   Message from Lauren Coombsachelle A Denney, CNM sent at 10/16/2017  1:21 PM EDT ----- Please send patient to hospital for iron infusion, orders placed.  Once this week and repeat next week.  Thank you.   R.Denney

## 2017-10-17 NOTE — Telephone Encounter (Signed)
Pt advised. She voiced understanding and agreed with plan.

## 2017-10-17 NOTE — Discharge Instructions (Signed)
Iron Deficiency Anemia, Adult Iron deficiency anemia is a condition in which the concentration of red blood cells or hemoglobin in the blood is below normal because of too little iron. Hemoglobin is a substance in red blood cells that carries oxygen to the body's tissues. When the concentration of red blood cells or hemoglobin is too low, not enough oxygen reaches these tissues. Iron deficiency anemia is usually long-lasting (chronic) and it develops over time. It may or may not cause symptoms. It is a common type of anemia. What are the causes? This condition may be caused by:  Not enough iron in the diet.  Blood loss caused by bleeding in the intestine.  Blood loss from a gastrointestinal condition like Crohn disease.  Frequent blood draws, such as from blood donation.  Abnormal absorption in the gut.  Heavy menstrual periods in women.  Cancers of the gastrointestinal system, such as colon cancer.  What are the signs or symptoms? Symptoms of this condition may include:  Fatigue.  Headache.  Pale skin, lips, and nail beds.  Poor appetite.  Weakness.  Shortness of breath.  Dizziness.  Cold hands and feet.  Fast or irregular heartbeat.  Irritability. This is more common in severe anemia.  Rapid breathing. This is more common in severe anemia.  Mild anemia may not cause any symptoms. How is this diagnosed? This condition is diagnosed based on:  Your medical history.  A physical exam.  Blood tests.  You may have additional tests to find the underlying cause of your anemia, such as:  Testing for blood in the stool (fecal occult blood test).  A procedure to see inside your colon and rectum (colonoscopy).  A procedure to see inside your esophagus and stomach (endoscopy).  A test in which cells are removed from bone marrow (bone marrow aspiration) or fluid is removed from the bone marrow to be examined (biopsy). This is rarely needed.  How is this  treated? This condition is treated by correcting the cause of your iron deficiency. Treatment may involve:  Adding iron-rich foods to your diet.  Taking iron supplements. If you are pregnant or breastfeeding, you may need to take extra iron because your normal diet usually does not provide the amount of iron that you need.  Increasing vitamin C intake. Vitamin C helps your body absorb iron. Your health care provider may recommend that you take iron supplements along with a glass of orange juice or a vitamin C supplement.  Medicines to make heavy menstrual flow lighter.  Surgery.  You may need repeat blood tests to determine whether treatment is working. Depending on the underlying cause, the anemia should be corrected within 2 months of starting treatment. If the treatment does not seem to be working, you may need more testing. Follow these instructions at home: Medicines  Take over-the-counter and prescription medicines only as told by your health care provider. This includes iron supplements and vitamins.  If you cannot tolerate taking iron supplements by mouth, talk with your health care provider about taking them through a vein (intravenously) or an injection into a muscle.  For the best iron absorption, you should take iron supplements when your stomach is empty. If you cannot tolerate them on an empty stomach, you may need to take them with food.  Do not drink milk or take antacids at the same time as your iron supplements. Milk and antacids may interfere with iron absorption.  Iron supplements can cause constipation. To prevent constipation, include fiber   in your diet as told by your health care provider. A stool softener may also be recommended. Eating and drinking  Talk with your health care provider before changing your diet. He or she may recommend that you eat foods that contain a lot of iron, such as: ? Liver. ? Low-fat (lean) beef. ? Breads and cereals that have iron  added to them (are fortified). ? Eggs. ? Dried fruit. ? Dark green, leafy vegetables.  To help your body use the iron from iron-rich foods, eat those foods at the same time as fresh fruits and vegetables that are high in vitamin C. Foods that are high in vitamin C include: ? Oranges. ? Peppers. ? Tomatoes. ? Mangoes.  Drinkenoughfluid to keep your urine clear or pale yellow. General instructions  Return to your normal activities as told by your health care provider. Ask your health care provider what activities are safe for you.  Practice good hygiene. Anemia can make you more prone to illness and infection.  Keep all follow-up visits as told by your health care provider. This is important. Contact a health care provider if:  You feel nauseous or you vomit.  You feel weak.  You have unexplained sweating.  You develop symptoms of constipation, such as: ? Having fewer than three bowel movements a week. ? Straining to have a bowel movement. ? Having stools that are hard, dry, or larger than normal. ? Feeling full or bloated. ? Pain in the lower abdomen. ? Not feeling relief after having a bowel movement. Get help right away if:  You faint. If this happens, do not drive yourself to the hospital. Call your local emergency services (911 in the [place]  You have chest pain.  You have shortness of breath that: ? Is severe. ? Gets worse with physical activity.  You have a rapid heartbeat.  You become light-headed when getting up from a sitting or lying down position. This information is not intended to replace advice given to you by your health care provider. Make sure you discuss any questions you have with your health care provider. Document Released: 11/29/2000 Document Revised: 08/21/2016 Document Reviewed: 08/21/2016 Elsevier Interactive Patient Education  2018 Elsevier Inc.  

## 2017-10-17 NOTE — Telephone Encounter (Signed)
I contacted patient in regards to Fe infusion.  I gave results and recommendations.  She voiced understanding and agreed with plan.  Pt requested I let you know [facility]. She states her BP has been fluctuating again.  She states it was "super" high at the hospital.  Pt asking how you would like to proceed and what she should do to handle the situation.  Please advise.

## 2017-10-17 NOTE — Telephone Encounter (Signed)
Please tell her to monitor if elevated again, go to MAU over the weekend.  Her blood pressures recorded were normal except for 1 that was 140.

## 2017-10-17 NOTE — Discharge Instructions (Signed)

## 2017-10-17 NOTE — MAU Provider Note (Signed)
History     CSN: [id number] Arrival date and time: 10/17/17 1340  None     Chief Complaint  Patient presents with  . feraheme    HPI: Lauren Conway is a 26 y.o. [phone] with IUP at 106w2d who presents to maternity admissions for iron infusion. Seen in MAU yesterday for headache, noted to be anemia with hgb of 8.7, and iron studies. She was called to day to come for iron infusion. Denies any lightheadedness, dizzines  Reports she was having some back pain last night, and thinks she was having Braxton-Hicks contractions. Denies any regular contractions. Also denies leakage of fluid or vaginal bleeding. Reports good fetal movement.   She receives [facility] at [employer]. Pregnancy complicated by anemia, asthma, anxiety, THC use, and hx fo HSV.  Past obstetric history: OB History  Gravida Para Term Preterm AB Living  5 2 2   2 2   SAB TAB Ectopic Multiple Live Births  2     0 2    # Outcome Date GA Lbr Len/2nd Weight Sex Delivery Anes PTL Lv  5 Current           4 SAB 01/2017 [redacted]w[redacted]d            Birth Comments: no complication  3 Term 08/09/15 [redacted]w[redacted]d 51:15 / 00:18 7 lb 11.6 oz (3.504 kg) M Vag-Spont EPI  LIV  2 Term 03/19/13 [redacted]w[redacted]d 14:40 / 00:34 6 lb 5.6 oz (2.88 kg) F Vag-Spont EPI  LIV     Birth Comments: umbilical hernia  1 SAB 2013 [redacted]w[redacted]d             Past Medical History:  Diagnosis Date  . Anemia   . Anxiety   . Asthma    uses inhaler, last used 8/22  . Depression   . Eczema   . History of chlamydia    12/2008; 03/09/2009  . History of gonorrhea    12/2008; 09/2010;   . 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 PID (pelvic inflammatory disease)     Past Surgical History:  Procedure Laterality Date  . NO PAST SURGERIES      Family History  Problem Relation Age of Onset  . Hypertension Mother   . Depression Mother   . Breast cancer Paternal Grandmother   . Cancer Paternal Grandmother        breast  . Diabetes Paternal Grandmother   . Asthma  Maternal Grandmother   . Other Neg Hx     Social History  Substance Use Topics  . Smoking status: Former Smoker    Packs/day: 0.00    Years: 0.50    Types: Cigarettes    Quit date: 03/16/2017  . Smokeless tobacco: Never Used  . Alcohol use No     Comment: not since pregnancy    Allergies:  Allergies  Allergen Reactions  . Eggs Or Egg-Derived Products Hives and Swelling  . Latex Itching and Swelling    Prescriptions Prior to Admission  Medication Sig Dispense Refill Last Dose  . acetaminophen (TYLENOL) 500 MG tablet Take 1,000 mg by mouth every 6 (six) hours as needed for moderate pain.    10/16/2017 at Unknown time  . albuterol (PROVENTIL HFA;VENTOLIN HFA) 108 (90 Base) MCG/ACT inhaler Inhale 1-2 puffs into the lungs every 6 (six) hours as needed for wheezing or shortness of breath. 1 Inhaler 2 10/16/2017 at Unknown time  . Butalbital-APAP-Caffeine 50-325-40 MG capsule Take 1-2 capsules by mouth every  6 (six) hours as needed for headache. 20 capsule 0   . cyclobenzaprine (FLEXERIL) 10 MG tablet Take 1 tablet (10 mg total) by mouth 2 (two) times daily as needed for muscle spasms. 20 tablet 0 Past Week at Unknown time  . Doxylamine-Pyridoxine ER (BONJESTA) 20-20 MG TBCR Take 1 tablet by mouth 2 (two) times daily. (Patient taking differently: Take 1 tablet by mouth 2 (two) times daily as needed (nausea and vomiting). ) 60 tablet 6 Past Month at Unknown time  . Elastic Bandages & Supports (COMFORT FIT MATERNITY SUPP LG) MISC 1 Units by Does not apply route daily. 1 each 0 10/15/2017 at Unknown time  . famotidine (PEPCID) 20 MG tablet Take 1 tablet (20 mg total) by mouth 2 (two) times daily. 60 tablet 2 10/16/2017 at Unknown time  . fluticasone (FLOVENT HFA) 110 MCG/ACT inhaler Inhale 2 puffs into the lungs daily.    Past Month at Unknown time  . Prenatal-DSS-FeCb-FeGl-FA (CITRANATAL BLOOM) 90-1 MG TABS Take 1 tablet by mouth daily. 30 tablet 12 10/15/2017 at Unknown time  . sertraline  (ZOLOFT) 25 MG tablet Take 2 tablets (50 mg total) by mouth daily. 60 tablet 5 10/16/2017 at Unknown time  . triamcinolone ointment (KENALOG) 0.5 % Apply 1 application topically 2 (two) times daily. 30 g 0 10/15/2017 at Unknown time  . valACYclovir (VALTREX) 500 MG tablet TAKE 1 TABLET (500 MG) BY MOUTH EVERY DAY. 30 tablet 2 10/15/2017 at Unknown time    Review of Systems - Negative except for what is mentioned in HPI.  Physical Exam   Blood pressure 118/73, pulse 85, temperature 98 F (36.7 C), temperature source Oral, resp. rate 18, last menstrual period 12/04/2016.  Constitutional: Well-developed, well-nourished female in no acute distress.  HENT: Crucible/AT, normal oropharynx mucosa. MMM Eyes: normal conjunctivae, no scleral icterus Cardiovascular: normal rate, regular rhythm Respiratory: normal effort, lungs CTAB.  GI: Abd soft, non-tender, gravid appropriate for gestational age.   GU: Neg CVAT. MSK: Extremities nontender, no edema, normal ROM Neurologic: Alert and oriented x 4. Psych: Normal mood and affect Skin: warm and dry   FHT:  Baseline 120 , moderate variability, accelerations present, no decelerations Toco: none  MAU Course  Procedures  MDM Pt here for iron infusion, ordered.  Reactive NST  Assessment and Plan  Assessment: 1. Iron deficiency anemia during pregnancy     Plan: --Discharge home in stable condition s/p ferriheme given  --Information given to go to infusion center next week for second done --Follow PNV as scheduled on Monday.  [name], Kandra NicolasJulie P, MD 10/17/2017 3:50 PM

## 2017-10-17 NOTE — MAU Note (Signed)
Pt here for feraheme infusion.  Pt C/O lower back pain, was seen in MAU last night for HA.  Was having the back pain last night.  Denies contractions, bleeding or LOF.

## 2017-10-20 ENCOUNTER — Encounter: Payer: Medicaid Other | Admitting: Certified Nurse Midwife

## 2017-10-20 ENCOUNTER — Ambulatory Visit (INDEPENDENT_AMBULATORY_CARE_PROVIDER_SITE_OTHER): Payer: Medicaid Other | Admitting: Certified Nurse Midwife

## 2017-10-20 DIAGNOSIS — Z3480 Encounter for supervision of other normal pregnancy, unspecified trimester: Secondary | ICD-10-CM

## 2017-10-20 DIAGNOSIS — Z3483 Encounter for supervision of other normal pregnancy, third trimester: Secondary | ICD-10-CM

## 2017-10-20 NOTE — Progress Notes (Signed)
Patient reports good fetal movement with pressure and irregular contractions. Pt complains of swelling in feet when she stands.

## 2017-10-20 NOTE — Progress Notes (Signed)
   PRENATAL VISIT NOTE  Subjective:  Lauren Conway is a 26 y.o. [id number] at 266w5d being seen today for ongoing prenatal care.  She is currently monitored for the following issues for this low-risk pregnancy and has Supervision of other normal pregnancy, antepartum; HSV (herpes simplex virus) anogenital infection; Maternal varicella, non-immune; Substance abuse affecting pregnancy, antepartum; Pain of round ligament affecting pregnancy, antepartum; Anxiety; Asthma, chronic, mild intermittent, uncomplicated; Eczema; and Anemia affecting pregnancy in third trimester on their problem list.  Patient reports no bleeding, no cramping, no leaking, occasional contractions and lower edema in feet.  Contractions: Irregular. Vag. Bleeding: None.  Movement: Present. Denies leaking of fluid.   The following portions of the patient's history were reviewed and updated as appropriate: allergies, current medications, past family history, past medical history, past social history, past surgical history and problem list. Problem list updated.  Objective:   Vitals:   10/20/17 1359  BP: 135/80  Pulse: 92  Weight: 206 lb 6.4 oz (93.6 kg)    Fetal Status: Fetal Heart Rate (bpm): 149; dopplert Fundal Height: 37 cm Movement: Present  Presentation: Vertex  General:  Alert, oriented and cooperative. Patient is in no acute distress.  Skin: Skin is warm and dry. No rash noted.   Cardiovascular: Normal heart rate noted  Respiratory: Normal respiratory effort, no problems with respiration noted  Abdomen: Soft, gravid, appropriate for gestational age.  Pain/Pressure: Present     Pelvic: Cervical exam performed Dilation: 1 Effacement (%): Thick Station: -3  Extremities: Normal range of motion.  Edema: Mild pitting, slight indentation  Mental Status:  Normal mood and affect. Normal behavior. Normal judgment and thought content.   Assessment and Plan:  Pregnancy: [id number] at [redacted]w[redacted]d  1. Supervision of other normal  pregnancy, antepartum     Unchanged cervix from previous exam.    Preterm labor symptoms and general obstetric precautions including but not limited to vaginal bleeding, contractions, leaking of fluid and fetal movement were reviewed in detail with the patient. Please refer to After Visit Summary for other counseling recommendations.  Return in about 1 week (around 10/27/2017) for ROB.   Roe Coombsachelle A Daaiel Starlin, CNM

## 2017-10-24 ENCOUNTER — Other Ambulatory Visit: Payer: Self-pay

## 2017-10-24 ENCOUNTER — Inpatient Hospital Stay (HOSPITAL_COMMUNITY)
Admission: AD | Admit: 2017-10-24 | Discharge: 2017-10-25 | Disposition: A | Discharge status: Home / Self Care | Payer: Medicaid Other | Source: Ambulatory Visit | Attending: Obstetrics & Gynecology | Admitting: Obstetrics & Gynecology

## 2017-10-24 DIAGNOSIS — O471 False labor at or after 37 completed weeks of gestation: Secondary | ICD-10-CM

## 2017-10-24 DIAGNOSIS — Z3A37 37 weeks gestation of pregnancy: Secondary | ICD-10-CM | POA: Insufficient documentation

## 2017-10-24 DIAGNOSIS — D509 Iron deficiency anemia, unspecified: Secondary | ICD-10-CM | POA: Diagnosis not present

## 2017-10-24 DIAGNOSIS — D649 Anemia, unspecified: Secondary | ICD-10-CM | POA: Diagnosis not present

## 2017-10-24 DIAGNOSIS — O99013 Anemia complicating pregnancy, third trimester: Secondary | ICD-10-CM | POA: Diagnosis present

## 2017-10-24 DIAGNOSIS — O99320 Drug use complicating pregnancy, unspecified trimester: Secondary | ICD-10-CM

## 2017-10-24 MED ORDER — FERUMOXYTOL INJECTION 510 MG/17 ML
510.0000 mg | Freq: Once | INTRAVENOUS | Status: AC
  Administered 2017-10-24: 510 mg via INTRAVENOUS
  Filled 2017-10-24: qty 17

## 2017-10-24 MED ORDER — SODIUM CHLORIDE 0.9 % IV SOLN
INTRAVENOUS | Status: DC
  Administered 2017-10-24: 23:00:00 via INTRAVENOUS

## 2017-10-24 NOTE — MAU Provider Note (Signed)
Ms. Deri FuellingJatavia Maruyama is a 26 y.o. [phone] at 2931w2d who presents to MAU today for iron infusion. She felt she was having regular painful contractions and was evaluated for labor, but cervix did not change during her visit in MAU.   Medical screening exam complete  Patient stable for discharge.   Marny LowensteinWenzel, Art Levan N, PA-C 10/24/2017 11:57 PM

## 2017-10-24 NOTE — MAU Note (Signed)
Here for IV iron infusion. Some swelling in feet and ankles. Occ ctxs and would like to have cervix checked.

## 2017-10-24 NOTE — Discharge Instructions (Signed)
Fetal Movement Counts °Patient Name: ________________________________________________ Patient Due Date: ____________________ °What is a fetal movement count? °A fetal movement count is the number of times that you feel your baby move during a certain amount of time. This may also be called a fetal kick count. A fetal movement count is recommended for every pregnant woman. You may be asked to start counting fetal movements as early as week 28 of your pregnancy. °Pay attention to when your baby is most active. You may notice your baby's sleep and wake cycles. You may also notice things that make your baby move more. You should do a fetal movement count: °· When your baby is normally most active. °· At the same time each day. ° °A good time to count movements is while you are resting, after having something to eat and drink. °How do I count fetal movements? °1. Find a quiet, comfortable area. Sit, or lie down on your side. °2. Write down the date, the start time and stop time, and the number of movements that you felt between those two times. Take this information with you to your health care visits. °3. For 2 hours, count kicks, flutters, swishes, rolls, and jabs. You should feel at least 10 movements during 2 hours. °4. You may stop counting after you have felt 10 movements. °5. If you do not feel 10 movements in 2 hours, have something to eat and drink. Then, keep resting and counting for 1 hour. If you feel at least 4 movements during that hour, you may stop counting. °Contact a health care provider if: °· You feel fewer than 4 movements in 2 hours. °· Your baby is not moving like he or she usually does. °Date: ____________ Start time: ____________ Stop time: ____________ Movements: ____________ °Date: ____________ Start time: ____________ Stop time: ____________ Movements: ____________ °Date: ____________ Start time: ____________ Stop time: ____________ Movements: ____________ °Date: ____________ Start time:  ____________ Stop time: ____________ Movements: ____________ °Date: ____________ Start time: ____________ Stop time: ____________ Movements: ____________ °Date: ____________ Start time: ____________ Stop time: ____________ Movements: ____________ °Date: ____________ Start time: ____________ Stop time: ____________ Movements: ____________ °Date: ____________ Start time: ____________ Stop time: ____________ Movements: ____________ °Date: ____________ Start time: ____________ Stop time: ____________ Movements: ____________ °This information is not intended to replace advice given to you by your health care provider. Make sure you discuss any questions you have with your health care provider. °Document Released: 01/01/2007 Document Revised: 07/31/2016 Document Reviewed: 01/11/2016 °Elsevier Interactive Patient Education © 2018 Elsevier Inc. °Braxton Hicks Contractions °Contractions of the uterus can occur throughout pregnancy, but they are not always a sign that you are in labor. You may have practice contractions called Braxton Hicks contractions. These false labor contractions are sometimes confused with true labor. °What are Braxton Hicks contractions? °Braxton Hicks contractions are tightening movements that occur in the muscles of the uterus before labor. Unlike true labor contractions, these contractions do not result in opening (dilation) and thinning of the cervix. Toward the end of pregnancy (32-34 weeks), Braxton Hicks contractions can happen more often and may become stronger. These contractions are sometimes difficult to tell apart from true labor because they can be very uncomfortable. You should not feel embarrassed if you go to the hospital with false labor. °Sometimes, the only way to tell if you are in true labor is for your health care provider to look for changes in the cervix. The health care provider will do a physical exam and may monitor your contractions. If   you are not in true labor, the exam  should show that your cervix is not dilating and your water has not broken. °If there are no prenatal problems or other health problems associated with your pregnancy, it is completely safe for you to be sent home with false labor. You may continue to have Braxton Hicks contractions until you go into true labor. °How can I tell the difference between true labor and false labor? °· Differences °? False labor °? Contractions last 30-70 seconds.: Contractions are usually shorter and not as strong as true labor contractions. °? Contractions become very regular.: Contractions are usually irregular. °? Discomfort is usually felt in the top of the uterus, and it spreads to the lower abdomen and low back.: Contractions are often felt in the front of the lower abdomen and in the groin. °? Contractions do not go away with walking.: Contractions may go away when you walk around or change positions while lying down. °? Contractions usually become more intense and increase in frequency.: Contractions get weaker and are shorter-lasting as time goes on. °? The cervix dilates and gets thinner.: The cervix usually does not dilate or become thin. °Follow these instructions at home: °· Take over-the-counter and prescription medicines only as told by your health care provider. °· Keep up with your usual exercises and follow other instructions from your health care provider. °· Eat and drink lightly if you think you are going into labor. °· If Braxton Hicks contractions are making you uncomfortable: °? Change your position from lying down or resting to walking, or change from walking to resting. °? Sit and rest in a tub of warm water. °? Drink enough fluid to keep your urine clear or pale yellow. Dehydration may cause these contractions. °? Do slow and deep breathing several times an hour. °· Keep all follow-up prenatal visits as told by your health care provider. This is important. °Contact a health care provider if: °· You have a  fever. °· You have continuous pain in your abdomen. °Get help right away if: °· Your contractions become stronger, more regular, and closer together. °· You have fluid leaking or gushing from your vagina. °· You pass blood-tinged mucus (bloody show). °· You have bleeding from your vagina. °· You have low back pain that you never had before. °· You feel your baby’s head pushing down and causing pelvic pressure. °· Your baby is not moving inside you as much as it used to. °Summary °· Contractions that occur before labor are called Braxton Hicks contractions, false labor, or practice contractions. °· Braxton Hicks contractions are usually shorter, weaker, farther apart, and less regular than true labor contractions. True labor contractions usually become progressively stronger and regular and they become more frequent. °· Manage discomfort from Braxton Hicks contractions by changing position, resting in a warm bath, drinking plenty of water, or practicing deep breathing. °This information is not intended to replace advice given to you by your health care provider. Make sure you discuss any questions you have with your health care provider. °Document Released: 12/02/2005 Document Revised: 10/21/2016 Document Reviewed: 10/21/2016 °Elsevier Interactive Patient Education © 2017 Elsevier Inc. ° °

## 2017-10-27 ENCOUNTER — Inpatient Hospital Stay (HOSPITAL_COMMUNITY)
Admission: AD | Admit: 2017-10-27 | Discharge: 2017-10-30 | DRG: 797 | Disposition: A | Discharge status: Home / Self Care | Payer: Medicaid Other | Source: Ambulatory Visit | Attending: Family Medicine | Admitting: Family Medicine

## 2017-10-27 ENCOUNTER — Encounter (HOSPITAL_COMMUNITY): Payer: Self-pay | Admitting: Certified Registered Nurse Anesthetist

## 2017-10-27 ENCOUNTER — Encounter (HOSPITAL_COMMUNITY): Payer: Self-pay

## 2017-10-27 ENCOUNTER — Other Ambulatory Visit: Payer: Self-pay

## 2017-10-27 DIAGNOSIS — Z87891 Personal history of nicotine dependence: Secondary | ICD-10-CM

## 2017-10-27 DIAGNOSIS — J45909 Unspecified asthma, uncomplicated: Secondary | ICD-10-CM | POA: Diagnosis present

## 2017-10-27 DIAGNOSIS — O139 Gestational [pregnancy-induced] hypertension without significant proteinuria, unspecified trimester: Secondary | ICD-10-CM | POA: Diagnosis present

## 2017-10-27 DIAGNOSIS — O9832 Other infections with a predominantly sexual mode of transmission complicating childbirth: Secondary | ICD-10-CM | POA: Diagnosis present

## 2017-10-27 DIAGNOSIS — D649 Anemia, unspecified: Secondary | ICD-10-CM | POA: Diagnosis present

## 2017-10-27 DIAGNOSIS — O134 Gestational [pregnancy-induced] hypertension without significant proteinuria, complicating childbirth: Principal | ICD-10-CM | POA: Diagnosis present

## 2017-10-27 DIAGNOSIS — F1910 Other psychoactive substance abuse, uncomplicated: Secondary | ICD-10-CM | POA: Diagnosis present

## 2017-10-27 DIAGNOSIS — A6000 Herpesviral infection of urogenital system, unspecified: Secondary | ICD-10-CM | POA: Diagnosis present

## 2017-10-27 DIAGNOSIS — Z302 Encounter for sterilization: Secondary | ICD-10-CM

## 2017-10-27 DIAGNOSIS — Z3A37 37 weeks gestation of pregnancy: Secondary | ICD-10-CM | POA: Diagnosis not present

## 2017-10-27 DIAGNOSIS — O133 Gestational [pregnancy-induced] hypertension without significant proteinuria, third trimester: Secondary | ICD-10-CM

## 2017-10-27 DIAGNOSIS — O99320 Drug use complicating pregnancy, unspecified trimester: Secondary | ICD-10-CM

## 2017-10-27 DIAGNOSIS — O9952 Diseases of the respiratory system complicating childbirth: Secondary | ICD-10-CM | POA: Diagnosis present

## 2017-10-27 DIAGNOSIS — R51 Headache: Secondary | ICD-10-CM | POA: Diagnosis not present

## 2017-10-27 DIAGNOSIS — Z91040 Latex allergy status: Secondary | ICD-10-CM

## 2017-10-27 DIAGNOSIS — Z9851 Tubal ligation status: Secondary | ICD-10-CM

## 2017-10-27 DIAGNOSIS — O99324 Drug use complicating childbirth: Secondary | ICD-10-CM | POA: Diagnosis present

## 2017-10-27 DIAGNOSIS — O9902 Anemia complicating childbirth: Secondary | ICD-10-CM | POA: Diagnosis present

## 2017-10-27 LAB — COMPREHENSIVE METABOLIC PANEL
ALK PHOS: 216 U/L — AB (ref 38–126)
ALT: 25 U/L (ref 14–54)
AST: 35 U/L (ref 15–41)
Albumin: 2.6 g/dL — ABNORMAL LOW (ref 3.5–5.0)
Anion gap: 8 (ref 5–15)
BILIRUBIN TOTAL: 0.6 mg/dL (ref 0.3–1.2)
CALCIUM: 8.6 mg/dL — AB (ref 8.9–10.3)
CO2: 25 mmol/L (ref 22–32)
CREATININE: 0.55 mg/dL (ref 0.44–1.00)
Chloride: 103 mmol/L (ref 101–111)
Glucose, Bld: 98 mg/dL (ref 65–99)
Potassium: 3.3 mmol/L — ABNORMAL LOW (ref 3.5–5.1)
Sodium: 136 mmol/L (ref 135–145)
TOTAL PROTEIN: 6.2 g/dL — AB (ref 6.5–8.1)

## 2017-10-27 LAB — CBC
HCT: 30.5 % — ABNORMAL LOW (ref 36.0–46.0)
Hemoglobin: 9.5 g/dL — ABNORMAL LOW (ref 12.0–15.0)
MCH: 27.3 pg (ref 26.0–34.0)
MCHC: 31.1 g/dL (ref 30.0–36.0)
MCV: 87.6 fL (ref 78.0–100.0)
PLATELETS: 271 10*3/uL (ref 150–400)
RBC: 3.48 MIL/uL — ABNORMAL LOW (ref 3.87–5.11)
RDW: 17.1 % — AB (ref 11.5–15.5)
WBC: 8 10*3/uL (ref 4.0–10.5)

## 2017-10-27 LAB — PROTEIN / CREATININE RATIO, URINE
CREATININE, URINE: 62 mg/dL
Protein Creatinine Ratio: 0.16 mg/mg{Cre} — ABNORMAL HIGH (ref 0.00–0.15)
TOTAL PROTEIN, URINE: 10 mg/dL

## 2017-10-27 LAB — TYPE AND SCREEN
ABO/RH(D): O POS
Antibody Screen: NEGATIVE

## 2017-10-27 MED ORDER — BUTALBITAL-APAP-CAFFEINE 50-325-40 MG PO TABS: 2.0000 | ORAL_TABLET | Freq: Once | ORAL | Status: DC

## 2017-10-27 MED ORDER — DIPHENHYDRAMINE HCL 50 MG/ML IJ SOLN: 12.5000 mg | INTRAMUSCULAR | Status: DC | PRN

## 2017-10-27 MED ORDER — LACTATED RINGERS IV SOLN
INTRAVENOUS | Status: DC
  Administered 2017-10-27 – 2017-10-28 (×3): via INTRAVENOUS

## 2017-10-27 MED ORDER — DIPHENHYDRAMINE HCL 25 MG PO CAPS
25.0000 mg | ORAL_CAPSULE | Freq: Once | ORAL | Status: AC
  Administered 2017-10-27: 25 mg via ORAL
  Filled 2017-10-27: qty 1

## 2017-10-27 MED ORDER — LIDOCAINE HCL (PF) 1 % IJ SOLN
30.0000 mL | INTRAMUSCULAR | Status: DC | PRN
  Filled 2017-10-27: qty 30

## 2017-10-27 MED ORDER — ACETAMINOPHEN 325 MG PO TABS: 650.0000 mg | ORAL_TABLET | ORAL | Status: DC | PRN

## 2017-10-27 MED ORDER — LACTATED RINGERS IV SOLN: 500.0000 mL | INTRAVENOUS | Status: DC | PRN

## 2017-10-27 MED ORDER — OXYTOCIN BOLUS FROM INFUSION
500.0000 mL | Freq: Once | INTRAVENOUS | Status: AC
  Administered 2017-10-28: 500 mL via INTRAVENOUS

## 2017-10-27 MED ORDER — FENTANYL CITRATE (PF) 100 MCG/2ML IJ SOLN
100.0000 ug | INTRAMUSCULAR | Status: DC | PRN
  Administered 2017-10-27 (×2): 100 ug via INTRAVENOUS
  Filled 2017-10-27 (×2): qty 2

## 2017-10-27 MED ORDER — ONDANSETRON HCL 4 MG/2ML IJ SOLN: 4.0000 mg | Freq: Four times a day (QID) | INTRAMUSCULAR | Status: DC | PRN

## 2017-10-27 MED ORDER — OXYCODONE-ACETAMINOPHEN 5-325 MG PO TABS
1.0000 | ORAL_TABLET | ORAL | Status: DC | PRN
  Administered 2017-10-28: 1 via ORAL
  Filled 2017-10-27: qty 1

## 2017-10-27 MED ORDER — PHENYLEPHRINE 40 MCG/ML (10ML) SYRINGE FOR IV PUSH (FOR BLOOD PRESSURE SUPPORT)
80.0000 ug | PREFILLED_SYRINGE | INTRAVENOUS | Status: DC | PRN
  Filled 2017-10-27: qty 5
  Filled 2017-10-27: qty 10

## 2017-10-27 MED ORDER — SOD CITRATE-CITRIC ACID 500-334 MG/5ML PO SOLN: 30.0000 mL | ORAL | Status: DC | PRN

## 2017-10-27 MED ORDER — METOCLOPRAMIDE HCL 10 MG PO TABS
10.0000 mg | ORAL_TABLET | Freq: Once | ORAL | Status: AC
  Administered 2017-10-27: 10 mg via ORAL
  Filled 2017-10-27: qty 1

## 2017-10-27 MED ORDER — PHENYLEPHRINE 40 MCG/ML (10ML) SYRINGE FOR IV PUSH (FOR BLOOD PRESSURE SUPPORT)
80.0000 ug | PREFILLED_SYRINGE | INTRAVENOUS | Status: DC | PRN
  Filled 2017-10-27: qty 5

## 2017-10-27 MED ORDER — LACTATED RINGERS IV SOLN
500.0000 mL | Freq: Once | INTRAVENOUS | Status: AC
  Administered 2017-10-28: 500 mL via INTRAVENOUS

## 2017-10-27 MED ORDER — EPHEDRINE 5 MG/ML INJ
10.0000 mg | INTRAVENOUS | Status: DC | PRN
  Filled 2017-10-27: qty 2

## 2017-10-27 MED ORDER — OXYCODONE-ACETAMINOPHEN 5-325 MG PO TABS: 2.0000 | ORAL_TABLET | ORAL | Status: DC | PRN

## 2017-10-27 MED ORDER — BUTALBITAL-APAP-CAFFEINE 50-325-40 MG PO TABS
2.0000 | ORAL_TABLET | Freq: Once | ORAL | Status: AC
  Administered 2017-10-27: 2 via ORAL
  Filled 2017-10-27: qty 2

## 2017-10-27 MED ORDER — FENTANYL 2.5 MCG/ML BUPIVACAINE 1/10 % EPIDURAL INFUSION (WH - ANES)
14.0000 mL/h | INTRAMUSCULAR | Status: DC | PRN
  Administered 2017-10-28 (×2): 14 mL/h via EPIDURAL
  Filled 2017-10-27 (×2): qty 100

## 2017-10-27 MED ORDER — OXYTOCIN 40 UNITS IN LACTATED RINGERS INFUSION - SIMPLE MED
2.5000 [IU]/h | INTRAVENOUS | Status: DC
  Filled 2017-10-27: qty 1000

## 2017-10-27 MED ORDER — MISOPROSTOL 200 MCG PO TABS
50.0000 ug | ORAL_TABLET | ORAL | Status: DC | PRN
  Administered 2017-10-27: 50 ug via ORAL
  Filled 2017-10-27: qty 1

## 2017-10-27 NOTE — Anesthesia Pain Management Evaluation Note (Signed)
  CRNA Pain Management Visit Note  Patient: Lauren Conway, 26 y.o., female  "Hello I am a [facility] anesthesia [facility] at [facility]. We have an anesthesia team available at all times to provide care throughout the hospital, including epidural management and anesthesia for C-section. I don't know your plan for the delivery whether it a natural birth, water birth, IV sedation, nitrous supplementation, doula or epidural, but we want to meet your pain goals."   1.Was your pain managed to your expectations on prior hospitalizations?   Yes   2.What is your expectation for pain management during this hospitalization?     Epidural  3.How can we help you reach that goal? Epidural when ready.  Record the patient's initial score and the patient's pain goal.   Pain: 4  Pain Goal: 7 [facility] wants you to be able to say your pain was always managed very well.  Lauren Conway L 10/27/2017

## 2017-10-27 NOTE — MAU Note (Signed)
Pt came in by EMS with c/o headache. Denies any bleeding or leaking of fluid. Having occasional contractions. Reports positive fetal movement. GBS negative

## 2017-10-27 NOTE — MAU Note (Addendum)
Headache began at 2100 on 0320. Took 2 Fiorecet Headache continued. Reports blurry vision right eye. No leaking of fluid; no bleeding. +Fetal movement.

## 2017-10-27 NOTE — H&P (Signed)
Lauren Conway is a 26 y.o. female presenting for headache.  Had one BP in office with 140 systolic Took Fioricet and did not get relief.   Patient Active Problem List   Diagnosis Date Noted  . Anemia affecting pregnancy in third trimester 09/01/2017  . Asthma, chronic, mild intermittent, uncomplicated 08/29/2017  . Eczema 08/29/2017  . Pain of round ligament affecting pregnancy, antepartum 06/27/2017  . Anxiety 06/27/2017  . Substance abuse affecting pregnancy, antepartum 05/11/2017  . Maternal varicella, non-immune 05/06/2017  . Supervision of other normal pregnancy, antepartum 05/01/2017  . HSV (herpes simplex virus) anogenital infection 05/01/2017    . OB History    Gravida Para Term Preterm AB Living   5 2 2   2 2    SAB TAB Ectopic Multiple Live Births   2     0 2     Past Medical History:  Diagnosis Date  . Anemia   . Anxiety   . Asthma    uses inhaler, last used 8/22  . Depression   . Eczema   . History of chlamydia    12/2008; 03/09/2009  . History of gonorrhea    12/2008; 09/2010;   . 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 PID (pelvic inflammatory disease)    Past Surgical History:  Procedure Laterality Date  . NO PAST SURGERIES     Family History: family history includes Asthma in her maternal grandmother; Breast cancer in her paternal grandmother; Cancer in her paternal grandmother; Depression in her mother; Diabetes in her paternal grandmother; Hypertension in her mother. Social History:  reports that she quit smoking about 7 months ago. Her smoking use included cigarettes. She smoked 0.00 packs per day for 0.50 years. she has never used smokeless tobacco. She reports that she does not drink alcohol or use drugs.     Maternal Diabetes: No Genetic Screening: Normal Maternal Ultrasounds/Referrals: Normal Fetal Ultrasounds or other Referrals:  None Maternal Substance Abuse:  No Significant Maternal Medications:  None Significant  Maternal Lab Results:  Lab values include: Group B Strep negative Other Comments:  None  Review of Systems  Constitutional: Negative for chills and fever.  Eyes: Negative for blurred vision.  Respiratory: Negative for shortness of breath.   Cardiovascular: Positive for leg swelling.  Gastrointestinal: Positive for abdominal pain. Negative for constipation, diarrhea, heartburn, nausea and vomiting.  Genitourinary: Negative for dysuria.  Neurological: Positive for headaches. Negative for sensory change, speech change and focal weakness.   Maternal Medical History:  Reason for admission: Nausea. Hypertension with headache  Contractions: Frequency: irregular.   Perceived severity is mild.    Fetal activity: Perceived fetal activity is normal.   Last perceived fetal movement was within the past hour.    Prenatal complications: PIH and substance abuse.   No pre-eclampsia or preterm labor.   Prenatal Complications - Diabetes: none.      Blood pressure 134/77, pulse 86, temperature 98.3 F (36.8 C), temperature source Oral, resp. rate 18, height 5\' 4"  (1.626 m), weight 205 lb (93 kg), last menstrual period 12/04/2016, SpO2 98 %. Maternal Exam:  Uterine Assessment: Contraction strength is mild.  Contraction frequency is irregular.   Abdomen: Patient reports no abdominal tenderness. Fetal presentation: vertex  Introitus: Normal vulva. Normal vagina.  Ferning test: not done.  Nitrazine test: not done. Amniotic fluid character: not assessed.  Pelvis: adequate for delivery.   Cervix: Cervix evaluated by digital exam.     Fetal Exam Fetal Monitor  Review: Mode: ultrasound.   Baseline rate: 140.  Variability: moderate (6-25 bpm).   Pattern: accelerations present and no decelerations.    Fetal State Assessment: Category I - tracings are normal.     Physical Exam  Constitutional: She is oriented to person, place, and time. She appears well-developed and well-nourished. No  distress.  HENT:  Head: Normocephalic.  Cardiovascular: Normal rate and regular rhythm.  Respiratory: Effort normal. No respiratory distress.  GI: Soft. She exhibits no distension. There is no tenderness. There is no rebound and no guarding.  Musculoskeletal: Normal range of motion.  Neurological: She is alert and oriented to person, place, and time. She has normal reflexes. She exhibits normal muscle tone.  Skin: Skin is warm and dry.  Psychiatric: She has a normal mood and affect.    Prenatal labs: ABO, Rh: O/Positive/-- (05/17 1710) Antibody: Negative (05/17 1710) Rubella: 3.89 (05/17 1710) RPR: Non Reactive (09/14 1018)  HBsAg: Negative (05/17 1710)  HIV:    GBS: Negative (10/29 1518)   Assessment/Plan: Single IUP at 4784w5d Gestational Hypertension Headache, mild Normal labs  [facility] Routine orders Induction  Of labor    Wynelle BourgeoisMarie Kloey Cazarez 10/27/2017, 5:56 AM

## 2017-10-27 NOTE — Progress Notes (Signed)
Labor Progress Note Deri FuellingJatavia Sane is a 26 y.o. [id number] at 4161w5d who presented to the [facility] for headaches and gestational hypertension.  S: Ms. Jenelle MagesHairston is resting with some discomfort but no acute complaints. Patient reports a minor headache but no visual disturbances or RUQ pain.   O:  BP (!) 142/84 (BP Location: Left Arm)   Pulse 80   Temp 98.7 F (37.1 C) (Oral)   Resp 18   Ht 5\' 4"  (1.626 m)   Wt 93 kg (205 lb)   LMP 12/04/2016 (LMP Unknown)   SpO2 100%   BMI 35.19 kg/m  EFM: Baseline:120 with moderate variability. Accelerations: Absent. Decelerations: Absent  CVE: Dilation: 3 Effacement (%): 50 Station: -3 Presentation: Vertex Exam by:: Dr. Rachelle HoraMoss   A&P:Delois Jenelle MagesHairston is a 26 y.o. [id number] 4061w5d who presents due to gestational hypertension and headaches.  #Labor: Progressing well. Foley bulb placed. Cytotec q 4hrsprn #Pain: Epidural #FWB: Category 1 #GBS negative  Sanjuana LettersMohamed Madilyn Cephas, Medical Student

## 2017-10-28 ENCOUNTER — Inpatient Hospital Stay (HOSPITAL_COMMUNITY): Payer: Medicaid Other | Admitting: Anesthesiology

## 2017-10-28 ENCOUNTER — Encounter (HOSPITAL_COMMUNITY): Payer: Self-pay | Admitting: *Deleted

## 2017-10-28 ENCOUNTER — Encounter: Payer: Medicaid Other | Admitting: Certified Nurse Midwife

## 2017-10-28 DIAGNOSIS — Z302 Encounter for sterilization: Secondary | ICD-10-CM

## 2017-10-28 DIAGNOSIS — O134 Gestational [pregnancy-induced] hypertension without significant proteinuria, complicating childbirth: Secondary | ICD-10-CM

## 2017-10-28 DIAGNOSIS — Z3A37 37 weeks gestation of pregnancy: Secondary | ICD-10-CM

## 2017-10-28 LAB — CBC
HCT: 30.1 % — ABNORMAL LOW (ref 36.0–46.0)
HEMOGLOBIN: 9.4 g/dL — AB (ref 12.0–15.0)
MCH: 27.2 pg (ref 26.0–34.0)
MCHC: 31.2 g/dL (ref 30.0–36.0)
MCV: 87.2 fL (ref 78.0–100.0)
Platelets: 245 10*3/uL (ref 150–400)
RBC: 3.45 MIL/uL — ABNORMAL LOW (ref 3.87–5.11)
RDW: 17 % — ABNORMAL HIGH (ref 11.5–15.5)
WBC: 8.2 10*3/uL (ref 4.0–10.5)

## 2017-10-28 LAB — RPR: RPR Ser Ql: NONREACTIVE

## 2017-10-28 LAB — HIV ANTIBODY (ROUTINE TESTING W REFLEX): HIV SCREEN 4TH GENERATION: NONREACTIVE

## 2017-10-28 MED ORDER — EPHEDRINE 5 MG/ML INJ
10.0000 mg | INTRAVENOUS | Status: DC | PRN
  Filled 2017-10-28: qty 2

## 2017-10-28 MED ORDER — COCONUT OIL OIL: 1.0000 "application " | TOPICAL_OIL | Status: DC | PRN

## 2017-10-28 MED ORDER — PRENATAL MULTIVITAMIN CH
1.0000 | ORAL_TABLET | Freq: Every day | ORAL | Status: DC
  Administered 2017-10-29 – 2017-10-30 (×2): 1 via ORAL
  Filled 2017-10-28 (×2): qty 1

## 2017-10-28 MED ORDER — SENNOSIDES-DOCUSATE SODIUM 8.6-50 MG PO TABS
2.0000 | ORAL_TABLET | ORAL | Status: DC
  Administered 2017-10-28 – 2017-10-29 (×2): 2 via ORAL
  Filled 2017-10-28 (×2): qty 2

## 2017-10-28 MED ORDER — TETANUS-DIPHTH-ACELL PERTUSSIS 5-2.5-18.5 LF-MCG/0.5 IM SUSP: 0.5000 mL | Freq: Once | INTRAMUSCULAR | Status: DC

## 2017-10-28 MED ORDER — WITCH HAZEL-GLYCERIN EX PADS: 1.0000 "application " | MEDICATED_PAD | CUTANEOUS | Status: DC | PRN

## 2017-10-28 MED ORDER — LIDOCAINE HCL (PF) 1 % IJ SOLN
INTRAMUSCULAR | Status: DC | PRN
  Administered 2017-10-28: 5 mL
  Administered 2017-10-28: 2 mL
  Administered 2017-10-28: 3 mL

## 2017-10-28 MED ORDER — IBUPROFEN 600 MG PO TABS
600.0000 mg | ORAL_TABLET | Freq: Four times a day (QID) | ORAL | Status: DC
  Administered 2017-10-28 – 2017-10-30 (×7): 600 mg via ORAL
  Filled 2017-10-28 (×7): qty 1

## 2017-10-28 MED ORDER — ZOLPIDEM TARTRATE 5 MG PO TABS: 5.0000 mg | ORAL_TABLET | Freq: Every evening | ORAL | Status: DC | PRN

## 2017-10-28 MED ORDER — LACTATED RINGERS IV SOLN: 500.0000 mL | Freq: Once | INTRAVENOUS | Status: DC

## 2017-10-28 MED ORDER — OXYTOCIN 40 UNITS IN LACTATED RINGERS INFUSION - SIMPLE MED
1.0000 m[IU]/min | INTRAVENOUS | Status: DC
  Administered 2017-10-28: 2 m[IU]/min via INTRAVENOUS

## 2017-10-28 MED ORDER — CEFAZOLIN SODIUM-DEXTROSE 2-4 GM/100ML-% IV SOLN
2.0000 g | Freq: Once | INTRAVENOUS | Status: AC
  Administered 2017-10-28: 2 g via INTRAVENOUS
  Filled 2017-10-28: qty 100

## 2017-10-28 MED ORDER — ACETAMINOPHEN 325 MG PO TABS
650.0000 mg | ORAL_TABLET | ORAL | Status: DC | PRN
Start: 2017-10-28 — End: 2017-10-30
  Administered 2017-10-28 – 2017-10-29 (×2): 650 mg via ORAL
  Filled 2017-10-28 (×2): qty 2

## 2017-10-28 MED ORDER — ONDANSETRON HCL 4 MG/2ML IJ SOLN: 4.0000 mg | INTRAMUSCULAR | Status: DC | PRN

## 2017-10-28 MED ORDER — TERBUTALINE SULFATE 1 MG/ML IJ SOLN
0.2500 mg | Freq: Once | INTRAMUSCULAR | Status: DC | PRN
  Filled 2017-10-28: qty 1

## 2017-10-28 MED ORDER — DIBUCAINE 1 % RE OINT: 1.0000 "application " | TOPICAL_OINTMENT | RECTAL | Status: DC | PRN

## 2017-10-28 MED ORDER — SIMETHICONE 80 MG PO CHEW: 80.0000 mg | CHEWABLE_TABLET | ORAL | Status: DC | PRN

## 2017-10-28 MED ORDER — BENZOCAINE-MENTHOL 20-0.5 % EX AERO: 1.0000 "application " | INHALATION_SPRAY | CUTANEOUS | Status: DC | PRN

## 2017-10-28 MED ORDER — PHENYLEPHRINE 40 MCG/ML (10ML) SYRINGE FOR IV PUSH (FOR BLOOD PRESSURE SUPPORT)
80.0000 ug | PREFILLED_SYRINGE | INTRAVENOUS | Status: DC | PRN
  Filled 2017-10-28: qty 5

## 2017-10-28 MED ORDER — DIPHENHYDRAMINE HCL 25 MG PO CAPS: 25.0000 mg | ORAL_CAPSULE | Freq: Four times a day (QID) | ORAL | Status: DC | PRN

## 2017-10-28 MED ORDER — ONDANSETRON HCL 4 MG PO TABS: 4.0000 mg | ORAL_TABLET | ORAL | Status: DC | PRN

## 2017-10-28 NOTE — Progress Notes (Signed)
Labor Progress Note Deri FuellingJatavia Akerson is a 26 y.o. [id number] at 4978w6d presented for IOL for gHTN S: Pt is resting comfortably in bed. She reports that she feels increased pressure. She states she had a headache earlier, but was resolved with tylenol. She also report "floaters and blurry vision" occasionally which she states has been going on for a month. Not associated with Headaches. Does not have the visual disturbances at the moment.  O:  BP 118/75   Pulse 77   Temp 98 F (36.7 C) (Axillary)   Resp 16   Ht 5\' 4"  (1.626 m)   Wt 205 lb (93 kg)   LMP 12/04/2016 (LMP Unknown)   SpO2 99%   BMI 35.19 kg/m  EFM: 140/mod vari/reactive  CVE: Dilation: 4.5 Effacement (%): 60 Cervical Position: Middle Station: -2, -1 Presentation: Vertex Exam by:: (Kenady Doxtater)   A&P: 26 y.o. [id number] 6778w6d IOL for gHTN #Labor: Feeling increased pressure. No change in cervical exam. Continue with pitocin #Pain: epidural #FWB: cat 1 #GBS negative #gHTN: BP well controlled at 118/75. Continue to monitor  Suella BroadKeriann S [place]ane[place] Spees, MD 10:06 AM

## 2017-10-28 NOTE — Anesthesia Preprocedure Evaluation (Addendum)
Anesthesia Evaluation  Patient identified by MRN, date of birth, ID band Patient awake    Reviewed: Allergy & Precautions, Patient's Chart, lab work & pertinent test results  Airway Mallampati: III  TM Distance: >3 FB     Dental   Pulmonary asthma , former smoker,    Pulmonary exam normal        Cardiovascular hypertension, Normal cardiovascular exam     Neuro/Psych negative neurological ROS     GI/Hepatic negative GI ROS, Neg liver ROS,   Endo/Other  negative endocrine ROS  Renal/GU negative Renal ROS     Musculoskeletal   Abdominal   Peds  Hematology  (+) anemia ,   Anesthesia Other Findings   Reproductive/Obstetrics (+) Pregnancy                              Lab Results  Component Value Date   WBC 8.2 10/28/2017   HGB 9.4 (L) 10/28/2017   HCT 30.1 (L) 10/28/2017   MCV 87.2 10/28/2017   PLT 245 10/28/2017   Lab Results  Component Value Date   CREATININE 0.55 10/27/2017   BUN <5 (L) 10/27/2017   NA 136 10/27/2017   K 3.3 (L) 10/27/2017   CL 103 10/27/2017   CO2 25 10/27/2017    Anesthesia Physical Anesthesia Plan  ASA: III  Anesthesia Plan: Epidural   Post-op Pain Management:    Induction:   PONV Risk Score and Plan: Treatment may vary due to age or medical condition  Airway Management Planned: Natural Airway  Additional Equipment:   Intra-op Plan:   Post-operative Plan:   Informed Consent: I have reviewed the patients History and Physical, chart, labs and discussed the procedure including the risks, benefits and alternatives for the proposed anesthesia with the patient or authorized representative who has indicated his/her understanding and acceptance.   Dental advisory given  Plan Discussed with: CRNA  Anesthesia Plan Comments:        Anesthesia Quick Evaluation

## 2017-10-28 NOTE — Progress Notes (Signed)
POSTPARTUM PROGRESS NOTE  Post Partum Day 1 Subjective:  Lauren Conway is a 26 y.o. [id number] 6855w6d s/p NSVD, complicated by manual extraction of placenta.  No acute events overnight.  Pt denies problems with ambulating, voiding or po intake.  She denies nausea or vomiting.  Pain is well controlled. Lochia Minimal.   Objective: Blood pressure 128/65, pulse 76, temperature 97.8 F (36.6 C), temperature source Oral, resp. rate 18, height 5\' 4"  (1.626 m), weight 205 lb (93 kg), last menstrual period 12/04/2016, SpO2 100 %.  Physical Exam:  General: alert, cooperative and no distress Lochia:normal flow Chest: no respiratory distress Heart:regular rate, distal pulses intact Abdomen: soft, nontender,  Uterine Fundus: firm, appropriately tender DVT Evaluation: No calf swelling or tenderness Extremities: no edema  Recent Labs    10/27/17 0440 10/28/17 0133  HGB 9.5* 9.4*  HCT 30.5* 30.1*    Assessment/Plan:  ASSESSMENT: Lauren Conway is a 26 y.o. [id number] 1855w6d s/p NSVD.  Plan for discharge tomorrow   LOS: 2 days   Malin Sambrano MossMD 10/29/2017, 8:13 AM

## 2017-10-28 NOTE — Progress Notes (Signed)
LABOR PROGRESS NOTE  Lauren FuellingJatavia Conway is a 26 y.o. [id number] at 3577w6d  admitted for IOL for gHTN  Subjective: Doing well. Denies any concerns at this time.  Objective: BP 120/81   Pulse 83   Temp 98 F (36.7 C) (Axillary)   Resp 16   Ht 5\' 4"  (1.626 m)   Wt 205 lb (93 kg)   LMP 12/04/2016 (LMP Unknown)   SpO2 99%   BMI 35.19 kg/m  or  Vitals:   10/28/17 1001 10/28/17 1031 10/28/17 1101 10/28/17 1131  BP: 124/70 120/77 120/64 120/81  Pulse: 85 79 90 83  Resp: 16 16 16 16   Temp:      TempSrc:      SpO2:      Weight:      Height:        SVE Dilation: 4.5 Effacement (%): 60 Cervical Position: Middle Station: -2 Presentation: Vertex Exam by:: Dr Nira Retortegele FHT: baseline rate 130, moderate varibility, +acel, no decel Toco: ctx not tracing well   Assessment / Plan: 26 y.o. [id number] at 7577w6d here for IOL for gHTN  Labor: AROM and IUPC placed at 10:30. Continue to titrate Pit to adequt MVUs Fetal Wellbeing:  Cat I Pain Control:  Epidural Anticipated MOD:  SVD  Frederik PearJulie P Kathline Banbury, MD 10/28/2017, 12:04 PM

## 2017-10-28 NOTE — Progress Notes (Signed)
Dr Hyacinth MeekerMiller notified that pt feeling contractions. Pt redosed.

## 2017-10-28 NOTE — Anesthesia Procedure Notes (Signed)
Epidural Patient location during procedure: OB Start time: 10/28/2017 2:00 AM End time: 10/28/2017 2:09 AM  Staffing Anesthesiologist: Marcene DuosFitzgerald, Oktober Glazer, MD Performed: anesthesiologist   Preanesthetic Checklist Completed: patient identified, site marked, surgical consent, pre-op evaluation, timeout performed, IV checked, risks and benefits discussed and monitors and equipment checked  Epidural Patient position: sitting Prep: site prepped and draped and DuraPrep Patient monitoring: continuous pulse ox and blood pressure Approach: midline Location: L4-L5 Injection technique: LOR air  Needle:  Needle type: Tuohy  Needle gauge: 17 G Needle length: 9 cm and 9 Needle insertion depth: 8 cm Catheter type: closed end flexible Catheter size: 19 Gauge Catheter at skin depth: 16 cm Test dose: negative  Assessment Events: blood not aspirated, injection not painful, no injection resistance, negative IV test and no paresthesia

## 2017-10-29 ENCOUNTER — Encounter (HOSPITAL_COMMUNITY): Payer: Self-pay | Admitting: Obstetrics and Gynecology

## 2017-10-29 ENCOUNTER — Encounter (HOSPITAL_COMMUNITY)
Admission: AD | Disposition: A | Discharge status: Home / Self Care | Payer: Self-pay | Source: Ambulatory Visit | Attending: Family Medicine

## 2017-10-29 DIAGNOSIS — Z9851 Tubal ligation status: Secondary | ICD-10-CM

## 2017-10-29 HISTORY — PX: TUBAL LIGATION: SHX77

## 2017-10-29 SURGERY — LIGATION, FALLOPIAN TUBE, POSTPARTUM
Anesthesia: Epidural | Site: Abdomen | Wound class: Clean Contaminated

## 2017-10-29 MED ORDER — BUPIVACAINE HCL (PF) 0.5 % IJ SOLN
INTRAMUSCULAR | Status: DC | PRN
  Administered 2017-10-29: 30 mL

## 2017-10-29 MED ORDER — MIDAZOLAM HCL 2 MG/2ML IJ SOLN
INTRAMUSCULAR | Status: AC
  Filled 2017-10-29: qty 2

## 2017-10-29 MED ORDER — OXYCODONE-ACETAMINOPHEN 5-325 MG PO TABS
2.0000 | ORAL_TABLET | ORAL | Status: DC | PRN
  Administered 2017-10-29 – 2017-10-30 (×5): 2 via ORAL
  Filled 2017-10-29 (×5): qty 2

## 2017-10-29 MED ORDER — FAMOTIDINE 20 MG PO TABS: 40.0000 mg | ORAL_TABLET | Freq: Once | ORAL | Status: AC

## 2017-10-29 MED ORDER — SODIUM BICARBONATE 8.4 % IV SOLN
INTRAVENOUS | Status: DC | PRN
  Administered 2017-10-29: 3 mL via EPIDURAL
  Administered 2017-10-29: 7 mL via EPIDURAL
  Administered 2017-10-29: 5 mL via EPIDURAL
  Administered 2017-10-29: 2 mL via EPIDURAL

## 2017-10-29 MED ORDER — KETOROLAC TROMETHAMINE 30 MG/ML IJ SOLN
30.0000 mg | Freq: Once | INTRAMUSCULAR | Status: DC | PRN
  Administered 2017-10-29: 30 mg via INTRAVENOUS

## 2017-10-29 MED ORDER — OXYCODONE HCL 5 MG/5ML PO SOLN: 5.0000 mg | Freq: Once | ORAL | Status: DC | PRN

## 2017-10-29 MED ORDER — LACTATED RINGERS IV SOLN
INTRAVENOUS | Status: DC | PRN
  Administered 2017-10-29 (×2): via INTRAVENOUS

## 2017-10-29 MED ORDER — HYDROMORPHONE HCL 1 MG/ML IJ SOLN
0.2500 mg | INTRAMUSCULAR | Status: DC | PRN
  Administered 2017-10-29 (×3): 0.5 mg via INTRAVENOUS

## 2017-10-29 MED ORDER — SODIUM BICARBONATE 8.4 % IV SOLN
INTRAVENOUS | Status: AC
  Filled 2017-10-29: qty 50

## 2017-10-29 MED ORDER — MEPERIDINE HCL 25 MG/ML IJ SOLN: 6.2500 mg | INTRAMUSCULAR | Status: DC | PRN

## 2017-10-29 MED ORDER — BUPIVACAINE HCL (PF) 0.5 % IJ SOLN
INTRAMUSCULAR | Status: AC
  Filled 2017-10-29: qty 30

## 2017-10-29 MED ORDER — MIDAZOLAM HCL 5 MG/5ML IJ SOLN
INTRAMUSCULAR | Status: DC | PRN
  Administered 2017-10-29: 2 mg via INTRAVENOUS

## 2017-10-29 MED ORDER — SODIUM CHLORIDE 0.9 % IR SOLN
Status: DC | PRN
  Administered 2017-10-29: 1

## 2017-10-29 MED ORDER — ONDANSETRON HCL 4 MG/2ML IJ SOLN
INTRAMUSCULAR | Status: DC | PRN
  Administered 2017-10-29: 4 mg via INTRAVENOUS

## 2017-10-29 MED ORDER — PROMETHAZINE HCL 25 MG/ML IJ SOLN: 6.2500 mg | INTRAMUSCULAR | Status: DC | PRN

## 2017-10-29 MED ORDER — ONDANSETRON HCL 4 MG/2ML IJ SOLN
INTRAMUSCULAR | Status: AC
  Filled 2017-10-29: qty 2

## 2017-10-29 MED ORDER — METOCLOPRAMIDE HCL 10 MG PO TABS: 10.0000 mg | ORAL_TABLET | Freq: Once | ORAL | Status: AC

## 2017-10-29 MED ORDER — FENTANYL CITRATE (PF) 100 MCG/2ML IJ SOLN
INTRAMUSCULAR | Status: AC
  Filled 2017-10-29: qty 2

## 2017-10-29 MED ORDER — LACTATED RINGERS IV SOLN
INTRAVENOUS | Status: DC
  Administered 2017-10-29: 1 mL via INTRAVENOUS
  Administered 2017-10-29: 20 mL/h via INTRAVENOUS

## 2017-10-29 MED ORDER — LIDOCAINE-EPINEPHRINE (PF) 2 %-1:200000 IJ SOLN
INTRAMUSCULAR | Status: AC
  Filled 2017-10-29: qty 20

## 2017-10-29 MED ORDER — METOCLOPRAMIDE HCL 10 MG PO TABS
10.0000 mg | ORAL_TABLET | Freq: Once | ORAL | Status: AC
  Administered 2017-10-29: 10 mg via ORAL
  Filled 2017-10-29: qty 1

## 2017-10-29 MED ORDER — FAMOTIDINE 20 MG PO TABS
40.0000 mg | ORAL_TABLET | Freq: Once | ORAL | Status: AC
  Administered 2017-10-29: 40 mg via ORAL
  Filled 2017-10-29: qty 2

## 2017-10-29 MED ORDER — LACTATED RINGERS IV SOLN: INTRAVENOUS | Status: DC

## 2017-10-29 MED ORDER — OXYCODONE HCL 5 MG PO TABS: 5.0000 mg | ORAL_TABLET | Freq: Once | ORAL | Status: DC | PRN

## 2017-10-29 MED ORDER — FENTANYL CITRATE (PF) 100 MCG/2ML IJ SOLN
INTRAMUSCULAR | Status: DC | PRN
  Administered 2017-10-29 (×2): 50 ug via INTRAVENOUS

## 2017-10-29 SURGICAL SUPPLY — 24 items
CHLORAPREP W/TINT 26ML (MISCELLANEOUS) ×2 IMPLANT
CLOTH BEACON ORANGE TIMEOUT ST (SAFETY) ×2 IMPLANT
CONTAINER PREFILL 10% NBF 15ML (MISCELLANEOUS) ×4 IMPLANT
DRSG COVADERM PLUS 2X2 (GAUZE/BANDAGES/DRESSINGS) ×1 IMPLANT
DRSG OPSITE POSTOP 3X4 (GAUZE/BANDAGES/DRESSINGS) ×2 IMPLANT
GLOVE BIOGEL M 6.5 STRL (GLOVE) ×2 IMPLANT
GLOVE BIOGEL PI IND STRL 6.5 (GLOVE) ×1 IMPLANT
GLOVE BIOGEL PI IND STRL 7.0 (GLOVE) ×1 IMPLANT
GLOVE BIOGEL PI INDICATOR 6.5 (GLOVE) ×1
GLOVE BIOGEL PI INDICATOR 7.0 (GLOVE) ×1
GOWN STRL REUS W/TWL LRG LVL3 (GOWN DISPOSABLE) ×4 IMPLANT
NEEDLE HYPO 22GX1.5 SAFETY (NEEDLE) ×2 IMPLANT
NS IRRIG 1000ML POUR BTL (IV SOLUTION) ×2 IMPLANT
PACK ABDOMINAL MINOR (CUSTOM PROCEDURE TRAY) ×2 IMPLANT
SPONGE LAP 4X18 X RAY DECT (DISPOSABLE) ×2 IMPLANT
SUT MON AB 4-0 PS1 27 (SUTURE) ×2 IMPLANT
SUT PLAIN 0 NONE (SUTURE) IMPLANT
SUT PLAIN 2 0 (SUTURE) ×2
SUT PLAIN ABS 2-0 CT1 27XMFL (SUTURE) ×1 IMPLANT
SUT VICRYL 0 UR6 27IN ABS (SUTURE) ×2 IMPLANT
SYR CONTROL 10ML LL (SYRINGE) ×2 IMPLANT
TOWEL OR 17X24 6PK STRL BLUE (TOWEL DISPOSABLE) ×4 IMPLANT
TRAY FOLEY CATH SILVER 14FR (SET/KITS/TRAYS/PACK) ×2 IMPLANT
WATER STERILE IRR 1000ML POUR (IV SOLUTION) ×2 IMPLANT

## 2017-10-29 NOTE — Progress Notes (Signed)
Met with pt to discuss pp btl. This is 3rd baby, discussed larc, patient very clear she wants btl, papers signed. No previous abdominal surgeries.  Patient desires permanent sterilization.  Other reversible forms of contraception were discussed with patient; she declines all other modalities. Risks of procedure discussed with patient including but not limited to: risk of regret, permanence of method, bleeding, infection, injury to surrounding organs and need for additional procedures.  Failure risk of 1-2 % with increased risk of ectopic gestation if pregnancy occurs was also discussed with patient.  Patient verbalized understanding of these risks and wants to proceed with sterilization.  Written informed consent obtained.  To OR when ready.

## 2017-10-29 NOTE — Addendum Note (Signed)
Addendum  created 10/29/17 1555 by Graciela HusbandsFussell, Lancelot Alyea O, CRNA   Sign clinical note

## 2017-10-29 NOTE — Anesthesia Postprocedure Evaluation (Signed)
Anesthesia Post Note  Patient: Deri FuellingJatavia Angelillo  Procedure(s) Performed: POST PARTUM TUBAL LIGATION (N/A Abdomen)     Patient location during evaluation: Mother Baby Anesthesia Type: Epidural Level of consciousness: awake and alert and oriented Pain management: satisfactory to patient Vital Signs Assessment: post-procedure vital signs reviewed and stable Respiratory status: respiratory function stable Cardiovascular status: stable Postop Assessment: no headache, no backache, epidural receding, patient able to bend at knees, no signs of nausea or vomiting and adequate PO intake Anesthetic complications: no    Last Vitals:  Vitals:   10/29/17 1330 10/29/17 1340  BP:    Pulse: 70 70  Resp: 14 18  Temp:  36.6 C  SpO2: 98% 96%    Last Pain:  Vitals:   10/29/17 1524  TempSrc:   PainSc: 5    Pain Goal: Patients Stated Pain Goal: 2 (10/28/17 1641)               Karleen DolphinFUSSELL,Catelin Manthe

## 2017-10-29 NOTE — Anesthesia Postprocedure Evaluation (Signed)
Anesthesia Post Note  Patient: Database administratorJatavia Conway  Procedure(s) Performed: POST PARTUM TUBAL LIGATION (N/A Abdomen)     Patient location during evaluation: PACU Anesthesia Type: Epidural Level of consciousness: awake and alert Pain management: pain level controlled Vital Signs Assessment: post-procedure vital signs reviewed and stable Respiratory status: spontaneous breathing, nonlabored ventilation and respiratory function stable Cardiovascular status: stable Postop Assessment: no headache, no backache and epidural receding Anesthetic complications: no    Last Vitals:  Vitals:   10/29/17 1330 10/29/17 1340  BP:    Pulse: 70 70  Resp: 14 18  Temp:  36.6 C  SpO2: 98% 96%    Last Pain:  Vitals:   10/29/17 1340  TempSrc:   PainSc: 5    Pain Goal: Patients Stated Pain Goal: 2 (10/28/17 1641)               Lauren LoronJohn Masaru Conway

## 2017-10-29 NOTE — Transfer of Care (Signed)
Immediate Anesthesia Transfer of Care Note  Patient: Lauren Conway  Procedure(s) Performed: POST PARTUM TUBAL LIGATION (N/A Abdomen)  Patient Location: PACU  Anesthesia Type:Epidural  Level of Consciousness: awake, alert  and oriented  Airway & Oxygen Therapy: Patient Spontanous Breathing and Patient connected to nasal cannula oxygen  Post-op Assessment: Report given to RN and Post -op Vital signs reviewed and stable  Post vital signs: Reviewed and stable  Last Vitals:  Vitals:   10/29/17 0626 10/29/17 0756  BP: (!) 114/56 128/65  Pulse: 71 76  Resp:  18  Temp:  36.6 C  SpO2:  100%    Last Pain:  Vitals:   10/29/17 0756  TempSrc: Oral  PainSc: 8       Patients Stated Pain Goal: 2 (10/28/17 1641)  Complications: No apparent anesthesia complications

## 2017-10-29 NOTE — Op Note (Addendum)
Deri FuellingJatavia Selvey 10/29/2017  PREOPERATIVE DIAGNOSIS:  Undesired fertility  POSTOPERATIVE DIAGNOSIS:  Undesired fertility  PROCEDURE:  Postpartum Bilateral Tubal Sterilization using Modified Pomeroy method  SURGEON: Surgeon(s) and Role:    * Nikkol Pai, Wilfred CurtisNoah Bedford, MD - Primary    * Degele, Kandra NicolasJulie P, MD - OB Fellow - Attending  ANESTHESIA:  Epidural  COMPLICATIONS:  None immediate.  ESTIMATED BLOOD LOSS:  100 mL  FLUIDS: 578 cc LR.  URINE OUTPUT:  750 cc of clear urine.  INDICATIONS: 26 y.o. yo [id number]  with undesired fertility,status post vaginal delivery, desires permanent sterilization. Risks and benefits of procedure discussed with patient including permanence of method, bleeding, infection, injury to surrounding organs and need for additional procedures. Risk failure of 0.5-1% with increased risk of ectopic gestation if pregnancy occurs was also discussed with patient.   FINDINGS:  Normal uterus, tubes, and ovaries.  TECHNIQUE:  The patient was taken to the operating room where her epidural anesthesia was dosed up to surgical level and found to be adequate.  She was then placed in the dorsal supine position and prepped and draped in sterile fashion.  After an adequate timeout was performed, attention was turned to the patient's abdomen where a small transverse skin incision was made under the umbilical fold. The incision was taken down to the layer of fascia using the Mayos, and fascia was incised, and extended bilaterally using Mayo scissors. The peritoneum was entered in a sharp fashion. Attention was then turned to the patient's uterus, and the left fallopian tube was identified and followed out to the fimbriated end. The left fallopian tube was grasped with Tanja PortBabcock, and a 3 cm segment of the tube was then ligated with 2 free ties of plain gut suture, transected and excised. Good hemostasis was noted and the tube was returned to the abdomen. The right fallopian tube was then identified  to its fimbriated end, ligated, and a 3 cm segment excised in a similar fashion. There was good hemostasis. The instruments were then removed from the patient's abdomen and the fascial incision was repaired with 0 Vicryl, a single plain gut subcutaneous stich placed. The skin was closed with a 3-0 Monocryl subcuticular stitch. The patient tolerated the procedure well.  Sponge, lap, and needle counts were correct times two.  The patient was then taken to the recovery room awake, extubated and in stable condition.  Degele, Kandra NicolasJulie P, MD OB Fellow 10/29/2017 11:09 AM   Attestation of Attending Supervision of Provider:  Evaluation and management procedures were performed by this provider under my supervision and collaboration. I have reviewed the provider's note and chart, and I agree with the management and plan. I participated in and supervised the surgery.  Shonna ChockNOAH Porshia Blizzard, MD Faculty Practice, [facility]

## 2017-10-29 NOTE — Lactation Note (Signed)
This note was copied from a baby's chart. Lactation Consultation Note  Patient Name: Lauren Conway ZOXWR'UToday's Date: 10/29/2017 Reason for consult: Initial assessment Mom has just returned from surgery and groggy.  Baby is now 2423 hours old and has been bottle fed formula.  Mom states she just wants baby to have colostrum but then plans on exclusively bottle feeding.  I offered assist in latching baby but mom prefers to wait until she is more awake.  Instructed to call for assist when she and baby ready.  Maternal Data Does the patient have breastfeeding experience prior to this delivery?: Yes  Feeding    LATCH Score                   Interventions    Lactation Tools Discussed/Used     Consult Status Consult Status: PRN    Huston FoleyMOULDEN, Byrne Capek S 10/29/2017, 2:06 PM

## 2017-10-30 ENCOUNTER — Encounter (HOSPITAL_COMMUNITY): Payer: Self-pay

## 2017-10-30 MED ORDER — IBUPROFEN 600 MG PO TABS: 600.0000 mg | ORAL_TABLET | Freq: Four times a day (QID) | ORAL | 0 refills | Status: DC

## 2017-10-30 MED ORDER — SENNOSIDES-DOCUSATE SODIUM 8.6-50 MG PO TABS: 2.0000 | ORAL_TABLET | Freq: Every evening | ORAL | 0 refills | Status: DC | PRN

## 2017-10-30 MED ORDER — OXYCODONE HCL 5 MG PO TABS: 5.0000 mg | ORAL_TABLET | Freq: Four times a day (QID) | ORAL | 0 refills | Status: DC | PRN

## 2017-10-30 NOTE — Discharge Summary (Signed)
OB Discharge Summary     Patient Name: Lauren Conway DOB: 02/14/1991 MRN: [id number]  Date of admission: 10/27/2017 Delivering MD: Frederik PearEGELE, JULIE P   Date of discharge: 10/30/2017  Admitting diagnosis: 37 WEEKS HA Intrauterine pregnancy: 711w1d     Secondary diagnosis:  Principal Problem:   SVD (spontaneous vaginal delivery) Active Problems:   Gestational hypertension   Status post tubal ligation  Additional problems:  Patient Active Problem List   Diagnosis Date Noted  . SVD (spontaneous vaginal delivery) 10/29/2017  . Status post tubal ligation 10/29/2017  . Gestational hypertension 10/27/2017  . Anemia affecting pregnancy in third trimester 09/01/2017  . Asthma, chronic, mild intermittent, uncomplicated 08/29/2017  . Eczema 08/29/2017  . Pain of round ligament affecting pregnancy, antepartum 06/27/2017  . Anxiety 06/27/2017  . Substance abuse affecting pregnancy, antepartum 05/11/2017  . Maternal varicella, non-immune 05/06/2017  . Supervision of other normal pregnancy, antepartum 05/01/2017  . HSV (herpes simplex virus) anogenital infection 05/01/2017      Discharge diagnosis: Term Pregnancy Delivered and Gestational Hypertension                                                                                                Post partum procedures:postpartum tubal ligation  Augmentation: AROM, Pitocin, Cytotec and Foley Balloon  Complications: Umbilical cord avulsion with manual extraction of placenta  Hospital course:  Induction of Labor With Vaginal Delivery   26 y.o. yo [id number] at 25102w6d was admitted to the hospital 10/27/2017 for induction of labor.  Indication for induction: Gestational hypertension.  Patient had an uncomplicated labor course as follows: Membrane Rupture Time/Date: 10:25 AM ,10/28/2017   Intrapartum Procedures: Episiotomy: None [1]                                         Lacerations:  None [1]  Patient had delivery of a Viable infant.   Information for the patient's newborn:  Peggye LeyHairston, Girl Makaylynn [[id number]][[id number]]  Delivery Method: Vag-Spont   10/28/2017  Details of delivery can be found in separate delivery note.  Patient had a routine postpartum course. Patient is discharged home 10/30/17.  Physical exam  Vitals:   10/29/17 1445 10/29/17 1845 10/29/17 2256 10/30/17 0642  BP: 126/80 131/80 128/67 105/64  Pulse: 69 65 91 71  Resp: 18 19 20 16   Temp: 98.1 F (36.7 C) 98 F (36.7 C) 97.9 F (36.6 C)   TempSrc: Oral Oral Oral   SpO2:      Weight:      Height:       General: alert, cooperative and no distress Lochia: appropriate Uterine Fundus: firm Incision: N/A DVT Evaluation: No evidence of DVT seen on physical exam. Labs: Lab Results  Component Value Date   WBC 8.2 10/28/2017   HGB 9.4 (L) 10/28/2017   HCT 30.1 (L) 10/28/2017   MCV 87.2 10/28/2017   PLT 245 10/28/2017   CMP Latest Ref Rng & Units 10/27/2017  Glucose 65 - 99 mg/dL 98  BUN 6 -  20 mg/dL <4(U<5(L)  Creatinine 9.810.44 - 1.00 mg/dL 1.910.55  Sodium 478135 - 295145 mmol/L 136  Potassium 3.5 - 5.1 mmol/L 3.3(L)  Chloride 101 - 111 mmol/L 103  CO2 22 - 32 mmol/L 25  Calcium 8.9 - 10.3 mg/dL 6.2(Z8.6(L)  Total Protein 6.5 - 8.1 g/dL 6.2(L)  Total Bilirubin 0.3 - 1.2 mg/dL 0.6  Alkaline Phos 38 - 126 U/L 216(H)  AST 15 - 41 U/L 35  ALT 14 - 54 U/L 25    Discharge instruction: per After Visit Summary and "Baby and Me Booklet".  After visit meds:  Allergies as of 10/30/2017      Reactions   Eggs Or Egg-derived Products Hives, Swelling   Latex Itching, Swelling      Medication List    STOP taking these medications   benzonatate 100 MG capsule Commonly known as:  TESSALON   cyclobenzaprine 10 MG tablet Commonly known as:  FLEXERIL   Doxylamine-Pyridoxine ER 20-20 MG Tbcr Commonly known as:  BONJESTA   valACYclovir 500 MG tablet Commonly known as:  VALTREX     TAKE these medications   acetaminophen 500 MG tablet Commonly known as:   TYLENOL Take 1,000 mg by mouth every 6 (six) hours as needed for moderate pain.   albuterol 108 (90 Base) MCG/ACT inhaler Commonly known as:  PROVENTIL HFA;VENTOLIN HFA Inhale 1-2 puffs into the lungs every 6 (six) hours as needed for wheezing or shortness of breath.   Butalbital-APAP-Caffeine 50-325-40 MG capsule Take 1-2 capsules by mouth every 6 (six) hours as needed for headache.   CITRANATAL BLOOM 90-1 MG Tabs Take 1 tablet by mouth daily.   famotidine 20 MG tablet Commonly known as:  PEPCID Take 1 tablet (20 mg total) by mouth 2 (two) times daily.   ibuprofen 600 MG tablet Commonly known as:  ADVIL,MOTRIN Take 1 tablet (600 mg total) every 6 (six) hours by mouth.   oxyCODONE 5 MG immediate release tablet Commonly known as:  Oxy IR/ROXICODONE Take 1 tablet (5 mg total) every 6 (six) hours as needed by mouth for severe pain.   senna-docusate 8.6-50 MG tablet Commonly known as:  Senokot-S Take 2 tablets at bedtime as needed by mouth for mild constipation.   sertraline 25 MG tablet Commonly known as:  ZOLOFT Take 2 tablets (50 mg total) by mouth daily.   triamcinolone ointment 0.5 % Commonly known as:  KENALOG Apply 1 application topically 2 (two) times daily.       Diet: routine diet  Activity: Advance as tolerated. Pelvic rest for 6 weeks.   Outpatient follow up:4 weeks, 1 week BP check Follow up Appt: Future Appointments  Date Time Provider Department Center  11/25/2017  1:15 PM Roe Coombsenney, Rachelle A, CNM [facility] None   Postpartum contraception: Tubal Ligation  Newborn Data: Live born female  Birth Weight: 5 lb 9.9 oz (2549 g) APGAR: 8, 9  Newborn Delivery   Birth date/time:  10/28/2017 14:38:00 Delivery type:  Vaginal, Spontaneous     Baby Feeding: Bottle Disposition:home with mother   10/30/2017 Caryl AdaJazma Jeraline Marcinek, DO

## 2017-10-30 NOTE — Discharge Instructions (Signed)

## 2017-10-31 ENCOUNTER — Encounter (HOSPITAL_COMMUNITY): Payer: Self-pay

## 2017-11-01 NOTE — Progress Notes (Signed)
CSW received consult for hx of Anxiety and Depression.  Of note, MOB scored "an 11 or 12 according to bedside RN."  CSW met with MOB to offer support and complete assessment.   MOB was lying in bed with baby asleep in bassinet.  MOB's young daughter was on the couch next to [facility] who was on the phone having what sounded like a heated conversation.  CSW introduced services and MOB welcomed CSW into the room.  She stated this was a good time to talk, although it did not feel like the best time to talk with her.  MOB told CSW that her mother was on the phone with FOB.  MOB informed CSW that FOB had gotten married in September and that she was trying to cope with this.  She states he is the father of her 98 year old son also and that he was incarcerated when that child was born.  She states her daughter, son and new baby are motivation for her, but that she experiences depression and anxiety related to her relationship/lack of relationship with FOB.  She became tearful.  CSW allowed her space to share and grieve and validated her feelings.  Her mother got off the phone and verbalized her thoughts about FOB's lack of involvement and communicated her commitment to continue to support MOB and her children.  She made it clear that she feels FOB should be held accountable for providing for his children and MGM may not be able to continue to financially provide for all of them indefinitely.   MOB states she is interested in increasing her Zoloft and states she needs a refill anyway.  CSW encouraged her to speak with her prescribing provider about this and to not wait until her postpartum visit (as she has already been discharged today).  CSW recommends counseling, which MOB is interested in.  CSW provided education regarding the baby blues period vs. perinatal mood disorders, and gave resources for mental health follow up.  CSW recommends self-evaluation during the postpartum time period using the New Mom Checklist from  Postpartum Progress, as well as the [place] Postnatal Depression Scale. CSW provided review of Sudden Infant Death Syndrome (SIDS) precautions.   CSW identifies no further need for intervention and no barriers to discharge at this time.  MOB seemed very appreciative of CSW's visit. CSW notes that baby's UDS was positive for barbiturates.  MOB had Rx for Fioricet.  MOB denies THC use in pregnancy.  CSW will monitor CDS result and make CPS report if warranted.

## 2017-11-12 ENCOUNTER — Telehealth: Payer: Self-pay

## 2017-11-12 NOTE — Telephone Encounter (Signed)
Ms. Lauren ParrJennifer Conway of [employer] called to give us a report on her visit with Lauren Conway.  She stated that the patient is smoking around the baby; apartment was filled of cigarette smoke/smell, she is also co-sleeping with the baby. Her PP Depression Screening Score = 13 BP 124/84.

## 2017-11-18 ENCOUNTER — Encounter: Payer: Self-pay | Admitting: Certified Nurse Midwife

## 2017-11-18 ENCOUNTER — Ambulatory Visit (INDEPENDENT_AMBULATORY_CARE_PROVIDER_SITE_OTHER): Payer: Medicaid Other | Admitting: Certified Nurse Midwife

## 2017-11-18 VITALS — BP 132/87 | HR 83 | Ht 64.0 in | Wt 178.8 lb

## 2017-11-18 DIAGNOSIS — Z1389 Encounter for screening for other disorder: Secondary | ICD-10-CM

## 2017-11-18 DIAGNOSIS — F53 Postpartum depression: Secondary | ICD-10-CM

## 2017-11-18 DIAGNOSIS — O99345 Other mental disorders complicating the puerperium: Secondary | ICD-10-CM

## 2017-11-18 MED ORDER — SERTRALINE HCL 100 MG PO TABS: 100.0000 mg | ORAL_TABLET | Freq: Every day | ORAL | 6 refills | Status: DC

## 2017-11-18 NOTE — Progress Notes (Signed)
..  Post Partum Exam  Lauren Conway is a 26 y.o. [phone] female who presents for a postpartum visit. She is 3 weeks postpartum following a spontaneous vaginal delivery. I have fully reviewed the prenatal and intrapartum course. The delivery was at 37.6 gestational weeks.  Anesthesia: epidural. Postpartum course has been . Baby's course has been normal. Baby is feeding by bottle - Similac Advance. Bleeding thin lochia. Bowel function is normal. Bladder function is normal. Patient is not sexually active. Contraception method is tubal ligation. Postpartum depression screening:pos  The following portions of the patient's history were reviewed and updated as appropriate: allergies, current medications, past family history, past medical history, past social history, past surgical history and problem list.  Review of Systems Pertinent items noted in HPI and remainder of comprehensive ROS otherwise negative.    Objective:  Blood pressure 133/84, pulse 83, height 5\' 4"  (1.626 m), weight 178 lb 12.8 oz (81.1 kg), unknown if currently breastfeeding.  General:  alert, cooperative and no distress   Breasts:  inspection negative, no nipple discharge or bleeding, no masses or nodularity palpable  Lungs: clear to auscultation bilaterally  Heart:  regular rate and rhythm, S1, S2 normal, no murmur, click, rub or gallop  Abdomen: soft, non-tender; bowel sounds normal; no masses,  no organomegaly, op site at umbilicus healed  Pelvic Exam: Not performed.        Assessment:    Normal 4 week postpartum exam. Pap smear not done at today's visit.   Postpartum depression: Zoloft increased, [facility] referral made  Plan:   1. Contraception: tubal ligation 2. F/U annual exam in a few weeks, f/u PPD in a few weeks 3. Follow up in: 3 weeks for annual exam or as needed.

## 2017-11-19 ENCOUNTER — Encounter: Payer: Self-pay | Admitting: Certified Nurse Midwife

## 2017-11-25 ENCOUNTER — Ambulatory Visit: Payer: Medicaid Other | Admitting: Certified Nurse Midwife

## 2017-12-29 ENCOUNTER — Ambulatory Visit: Payer: Medicaid Other | Admitting: Certified Nurse Midwife

## 2018-01-09 ENCOUNTER — Other Ambulatory Visit (HOSPITAL_COMMUNITY)
Admission: RE | Admit: 2018-01-09 | Discharge: 2018-01-09 | Disposition: A | Discharge status: Home / Self Care | Payer: Medicaid Other | Source: Ambulatory Visit | Attending: Certified Nurse Midwife | Admitting: Certified Nurse Midwife

## 2018-01-09 ENCOUNTER — Ambulatory Visit (INDEPENDENT_AMBULATORY_CARE_PROVIDER_SITE_OTHER): Payer: Medicaid Other | Admitting: Certified Nurse Midwife

## 2018-01-09 ENCOUNTER — Encounter: Payer: Self-pay | Admitting: Certified Nurse Midwife

## 2018-01-09 VITALS — BP 115/85 | HR 82 | Ht 63.0 in | Wt 183.0 lb

## 2018-01-09 DIAGNOSIS — F329 Major depressive disorder, single episode, unspecified: Secondary | ICD-10-CM | POA: Insufficient documentation

## 2018-01-09 DIAGNOSIS — Z01411 Encounter for gynecological examination (general) (routine) with abnormal findings: Secondary | ICD-10-CM | POA: Diagnosis not present

## 2018-01-09 DIAGNOSIS — D509 Iron deficiency anemia, unspecified: Secondary | ICD-10-CM | POA: Insufficient documentation

## 2018-01-09 DIAGNOSIS — Z79899 Other long term (current) drug therapy: Secondary | ICD-10-CM | POA: Diagnosis not present

## 2018-01-09 DIAGNOSIS — Z91012 Allergy to eggs: Secondary | ICD-10-CM | POA: Insufficient documentation

## 2018-01-09 DIAGNOSIS — R87612 Low grade squamous intraepithelial lesion on cytologic smear of cervix (LGSIL): Secondary | ICD-10-CM | POA: Diagnosis not present

## 2018-01-09 DIAGNOSIS — F17210 Nicotine dependence, cigarettes, uncomplicated: Secondary | ICD-10-CM | POA: Insufficient documentation

## 2018-01-09 DIAGNOSIS — Z9851 Tubal ligation status: Secondary | ICD-10-CM | POA: Diagnosis not present

## 2018-01-09 DIAGNOSIS — Z01419 Encounter for gynecological examination (general) (routine) without abnormal findings: Secondary | ICD-10-CM

## 2018-01-09 DIAGNOSIS — Z8249 Family history of ischemic heart disease and other diseases of the circulatory system: Secondary | ICD-10-CM | POA: Diagnosis not present

## 2018-01-09 DIAGNOSIS — Z0000 Encounter for general adult medical examination without abnormal findings: Secondary | ICD-10-CM

## 2018-01-09 DIAGNOSIS — N898 Other specified noninflammatory disorders of vagina: Secondary | ICD-10-CM | POA: Diagnosis not present

## 2018-01-09 DIAGNOSIS — Z124 Encounter for screening for malignant neoplasm of cervix: Secondary | ICD-10-CM

## 2018-01-09 DIAGNOSIS — F419 Anxiety disorder, unspecified: Secondary | ICD-10-CM | POA: Insufficient documentation

## 2018-01-09 DIAGNOSIS — Z91040 Latex allergy status: Secondary | ICD-10-CM | POA: Diagnosis not present

## 2018-01-09 DIAGNOSIS — D508 Other iron deficiency anemias: Secondary | ICD-10-CM

## 2018-01-09 DIAGNOSIS — Z113 Encounter for screening for infections with a predominantly sexual mode of transmission: Secondary | ICD-10-CM

## 2018-01-09 NOTE — Progress Notes (Signed)
Patient presents for her Annual Exam today. LMP:01/05/2018 Contraception:BTL 10/29/2017 Last pap:11/06/2016 :WNL  STD Screening: Full panel  Desired

## 2018-01-09 NOTE — Progress Notes (Signed)
Subjective:        Lauren Conway is a 27 y.o. female here for a routine exam.  Current complaints: irregular periods, last one was 4 days late with dysmenorrhea and heavy bleeding.  Discussed OTC Naproxen for the next period and Mirena IUD if not resolved with Naproxen.  Desires full STD screening.     Personal health questionnaire:  Is patient Ashkenazi Jewish, have a family history of breast and/or ovarian cancer: yes Is there a family history of uterine cancer diagnosed at age < 3450, gastrointestinal cancer, urinary tract cancer, family member who is a Personnel officerLynch syndrome-associated carrier: no Is the patient overweight and hypertensive, family history of diabetes, personal history of gestational diabetes, preeclampsia or PCOS: no Is patient over 1055, have PCOS,  family history of premature CHD under age 26, diabetes, smoke, have hypertension or peripheral artery disease:  no At any time, has a partner hit, kicked or otherwise hurt or frightened you?: no Over the past 2 weeks, have you felt down, depressed or hopeless?: no Over the past 2 weeks, have you felt little interest or pleasure in doing things?:no   Gynecologic History Patient's last menstrual period was 01/05/2018 (exact date). Contraception: tubal ligation Last Pap: 11/06/16. Results were: normal Last mammogram: n/a <40 years, no sig family hx.  Obstetric History OB History  Gravida Para Term Preterm AB Living  5 3 3   2 3   SAB TAB Ectopic Multiple Live Births  2     0 3    # Outcome Date GA Lbr Len/2nd Weight Sex Delivery Anes PTL Lv  5 Term 10/28/17 4659w6d   F Vag-Spont   LIV  4 SAB 01/2017 6053w0d            Birth Comments: no complication  3 Term 08/09/15 5214w2d 51:15 / 00:18 7 lb 11.6 oz (3.504 kg) M Vag-Spont EPI  LIV  2 Term 03/19/13 112w1d 14:40 / 00:34 6 lb 5.6 oz (2.88 kg) F Vag-Spont EPI  LIV     Birth Comments: umbilical hernia  1 SAB 2013 6056w0d             Past Medical History:  Diagnosis Date  . Anemia    . Anxiety   . Asthma    uses inhaler, last used 8/22  . Depression   . Eczema   . History of chlamydia    12/2008; 03/09/2009  . History of gonorrhea    12/2008; 09/2010;   . 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 PID (pelvic inflammatory disease)     Past Surgical History:  Procedure Laterality Date  . NO PAST SURGERIES    . TUBAL LIGATION N/A 10/29/2017   Procedure: POST PARTUM TUBAL LIGATION;  Surgeon: Conan Bowensavis, Kelly M, MD;  [facility];  Service: Gynecology;  Laterality: N/A;     Current Outpatient Medications:  .  acetaminophen (TYLENOL) 500 MG tablet, Take 1,000 mg by mouth every 6 (six) hours as needed for moderate pain. , Disp: , Rfl:  .  albuterol (PROVENTIL HFA;VENTOLIN HFA) 108 (90 Base) MCG/ACT inhaler, Inhale 1-2 puffs into the lungs every 6 (six) hours as needed for wheezing or shortness of breath. (Patient not taking: Reported on 11/18/2017), Disp: 1 Inhaler, Rfl: 2 .  ibuprofen (ADVIL,MOTRIN) 600 MG tablet, Take 1 tablet (600 mg total) every 6 (six) hours by mouth., Disp: 30 tablet, Rfl: 0 .  Prenatal-DSS-FeCb-FeGl-FA (CITRANATAL BLOOM) 90-1 MG TABS, Take 1 tablet by  mouth daily. (Patient not taking: Reported on 11/18/2017), Disp: 30 tablet, Rfl: 12 .  sertraline (ZOLOFT) 100 MG tablet, Take 1 tablet (100 mg total) by mouth daily., Disp: 30 tablet, Rfl: 6 Allergies  Allergen Reactions  . Eggs Or Egg-Derived Products Hives and Swelling  . Latex Itching and Swelling    Social History   Tobacco Use  . Smoking status: Current Every Day Smoker    Packs/day: 0.00    Years: 0.50    Pack years: 0.00    Types: Cigarettes    Last attempt to quit: 03/16/2017    Years since quitting: 0.8  . Smokeless tobacco: Never Used  Substance Use Topics  . Alcohol use: No    Alcohol/week: 0.0 oz    Comment: not since pregnancy    Family History  Problem Relation Age of Onset  . Hypertension Mother   . Depression Mother   . Breast cancer  Paternal Grandmother   . Cancer Paternal Grandmother        breast  . Diabetes Paternal Grandmother   . Asthma Maternal Grandmother   . Other Neg Hx       Review of Systems  Constitutional: negative for fatigue and weight loss Respiratory: negative for cough and wheezing Cardiovascular: negative for chest pain, fatigue and palpitations Gastrointestinal: negative for abdominal pain and change in bowel habits Musculoskeletal:negative for myalgias Neurological: negative for gait problems and tremors Behavioral/Psych: negative for abusive relationship, depression Endocrine: negative for temperature intolerance    Genitourinary:negative for abnormal menstrual periods, genital lesions, hot flashes, sexual problems and vaginal discharge Integument/breast: negative for breast lump, breast tenderness, nipple discharge and skin lesion(s)    Objective:       Ht 5\' 3"  (1.6 m)   Wt 183 lb (83 kg)   LMP 01/05/2018 (Exact Date)   BMI 32.42 kg/m  General:   alert  Skin:   no rash or abnormalities  Lungs:   clear to auscultation bilaterally  Heart:   regular rate and rhythm, S1, S2 normal, no murmur, click, rub or gallop  Breasts:   normal without suspicious masses, skin or nipple changes or axillary nodes  Abdomen:  normal findings: no organomegaly, soft, non-tender and no hernia  Pelvis:  External genitalia: normal general appearance Urinary system: urethral meatus normal and bladder without fullness, nontender Vaginal: normal without tenderness, induration or masses Cervix: normal appearance Adnexa: normal bimanual exam Uterus: anteverted and non-tender, normal size   Lab Review Urine pregnancy test Labs reviewed yes Radiologic studies reviewed no  50% of 30 min visit spent on counseling and coordination of care.   Assessment & Plan    Healthy female exam.    1. Screening for cervical cancer   - Cytology - PAP  2. Screening examination for STD (sexually transmitted  disease)    - Cervicovaginal ancillary only - Hepatitis B surface antigen - Hepatitis C antibody - HIV antibody - RPR  3. Encounter for annual routine gynecological examination    - Cytology - PAP  4. Vaginal discharge    - Cervicovaginal ancillary only  5. Iron deficiency anemia secondary to inadequate dietary iron intake     - CBC   Education reviewed: calcium supplements, depression evaluation, low fat, low cholesterol diet, safe sex/STD prevention, self breast exams, skin cancer screening and weight bearing exercise. Contraception: tubal ligation. Follow up in: 1 year.    Orders Placed This Encounter  Procedures  . Hepatitis B surface antigen  . Hepatitis C antibody  .  HIV antibody  . RPR  . CBC   Follow up as needed.  Possible Mirena IUD for AUB post BTL.

## 2018-01-10 LAB — CBC
HEMATOCRIT: 38.5 % (ref 34.0–46.6)
Hemoglobin: 12.8 g/dL (ref 11.1–15.9)
MCH: 28.3 pg (ref 26.6–33.0)
MCHC: 33.2 g/dL (ref 31.5–35.7)
MCV: 85 fL (ref 79–97)
Platelets: 306 10*3/uL (ref 150–379)
RBC: 4.52 x10E6/uL (ref 3.77–5.28)
RDW: 18.9 % — AB (ref 12.3–15.4)
WBC: 6.9 10*3/uL (ref 3.4–10.8)

## 2018-01-10 LAB — RPR: RPR: NONREACTIVE

## 2018-01-10 LAB — HEPATITIS B SURFACE ANTIGEN: HEP B S AG: NEGATIVE

## 2018-01-10 LAB — HEPATITIS C ANTIBODY: Hep C Virus Ab: <0.1 s/co ratio (ref 0.0–0.9)

## 2018-01-10 LAB — HIV ANTIBODY (ROUTINE TESTING W REFLEX): HIV Screen 4th Generation wRfx: NONREACTIVE

## 2018-01-12 ENCOUNTER — Other Ambulatory Visit: Payer: Self-pay | Admitting: Certified Nurse Midwife

## 2018-01-12 DIAGNOSIS — N760 Acute vaginitis: Principal | ICD-10-CM

## 2018-01-12 DIAGNOSIS — B9689 Other specified bacterial agents as the cause of diseases classified elsewhere: Secondary | ICD-10-CM

## 2018-01-12 LAB — CERVICOVAGINAL ANCILLARY ONLY
Bacterial vaginitis: POSITIVE — AB
CHLAMYDIA, DNA PROBE: NEGATIVE
Candida vaginitis: NEGATIVE
NEISSERIA GONORRHEA: NEGATIVE
TRICH (WINDOWPATH): NEGATIVE

## 2018-01-12 MED ORDER — METRONIDAZOLE 1.3 % VA GEL: 1.0000 | Freq: Once | VAGINAL | 0 refills | Status: AC

## 2018-01-13 ENCOUNTER — Telehealth: Payer: Self-pay | Admitting: Pediatrics

## 2018-01-13 LAB — CYTOLOGY - PAP

## 2018-01-13 NOTE — Telephone Encounter (Signed)
[facility] PA confirmation# [id number]  D/t the fact patient has not failed 2 topical treatments (Clindesse, Cleocin Ovules, and Vandazole) this PA has to be submitted for review.  Per [facility] po Flagyl is considered in a different category.

## 2018-01-13 NOTE — Telephone Encounter (Signed)
-----   Message from Roe Coombsachelle A Denney, CNM sent at 01/12/2018  4:36 PM EST ----- Please call and let her know that her labs look good.  She does have BV.  Nuvessa vaginal gel was sent to the pharmacy for her to use. She has failed Flagyl and metrogel. Thank you.  R.Denney CNM

## 2018-01-15 ENCOUNTER — Other Ambulatory Visit: Payer: Self-pay | Admitting: Certified Nurse Midwife

## 2018-01-15 DIAGNOSIS — B9689 Other specified bacterial agents as the cause of diseases classified elsewhere: Secondary | ICD-10-CM

## 2018-01-15 DIAGNOSIS — N760 Acute vaginitis: Principal | ICD-10-CM

## 2018-01-15 MED ORDER — CLINDAMYCIN PHOSPHATE (1 DOSE) 2 % VA CREA: 1.0000 "application " | TOPICAL_CREAM | Freq: Once | VAGINAL | 0 refills | Status: AC

## 2018-01-15 MED ORDER — SECNIDAZOLE 2 G PO PACK: 1.0000 | PACK | Freq: Once | ORAL | 0 refills | Status: AC

## 2018-01-15 NOTE — Telephone Encounter (Signed)
I called to check the status of this PA.  [facility] denies PA d/t pt not failing 2 preferred "topical" drugs.  Clindesse, Cleocin Ovules, and Vandazole. Please advise.

## 2018-01-15 NOTE — Telephone Encounter (Signed)
PA approval for Solosec #[id number]  I tried to call pt, no ans/voicemail

## 2018-01-15 NOTE — Telephone Encounter (Signed)
Ok, thank you for your hard work!  I have sent in Solosec and Clindesse for her.   Thank you R.Ria Redcay CNM

## 2018-01-25 ENCOUNTER — Encounter (HOSPITAL_COMMUNITY): Payer: Self-pay

## 2018-01-25 ENCOUNTER — Emergency Department (HOSPITAL_COMMUNITY)
Admission: EM | Admit: 2018-01-25 | Discharge: 2018-01-25 | Disposition: A | Discharge status: Home / Self Care | Payer: 59 | Attending: Emergency Medicine | Admitting: Emergency Medicine

## 2018-01-25 ENCOUNTER — Other Ambulatory Visit: Payer: Self-pay

## 2018-01-25 DIAGNOSIS — J45909 Unspecified asthma, uncomplicated: Secondary | ICD-10-CM | POA: Insufficient documentation

## 2018-01-25 DIAGNOSIS — R05 Cough: Secondary | ICD-10-CM | POA: Diagnosis present

## 2018-01-25 DIAGNOSIS — F17210 Nicotine dependence, cigarettes, uncomplicated: Secondary | ICD-10-CM | POA: Insufficient documentation

## 2018-01-25 DIAGNOSIS — Z91040 Latex allergy status: Secondary | ICD-10-CM | POA: Insufficient documentation

## 2018-01-25 DIAGNOSIS — J029 Acute pharyngitis, unspecified: Secondary | ICD-10-CM | POA: Diagnosis not present

## 2018-01-25 DIAGNOSIS — Z79899 Other long term (current) drug therapy: Secondary | ICD-10-CM | POA: Diagnosis not present

## 2018-01-25 LAB — RAPID STREP SCREEN (MED CTR MEBANE ONLY): Streptococcus, Group A Screen (Direct): NEGATIVE

## 2018-01-25 MED ORDER — DEXAMETHASONE 4 MG PO TABS
8.0000 mg | ORAL_TABLET | Freq: Once | ORAL | Status: AC
  Administered 2018-01-25: 8 mg via ORAL
  Filled 2018-01-25: qty 2

## 2018-01-25 MED ORDER — IBUPROFEN 200 MG PO TABS
600.0000 mg | ORAL_TABLET | Freq: Once | ORAL | Status: AC
Start: 2018-01-25 — End: 2018-01-25
  Administered 2018-01-25: 600 mg via ORAL
  Filled 2018-01-25: qty 3

## 2018-01-25 NOTE — ED Triage Notes (Addendum)
Pt that her children were dx'd with the flu earlier this week. On Friday, she started experiencing congestion, cough, watery eyes, and sore throat. Denies body aches. No N/V/D. A&Ox4. Ambulatory.

## 2018-01-25 NOTE — ED Provider Notes (Signed)
[facility]-EMERGENCY DEPT Provider Note   CSN: [id number] Arrival date & time: 01/25/18  1347     History   Chief Complaint Chief Complaint  Patient presents with  . Flu Like Symptoms    HPI Lauren Conway is a 27 y.o. female.  HPI   27 year old female with flulike symptoms.  Symptoms started Friday.  Progressing since then.  Cough, headaches, sore throat, subjective fever and fatigue.  She reports that she has 2 children who were diagnosed with flu earlier this week.  No acute urinary complaints.  No rash.  Past Medical History:  Diagnosis Date  . Anemia   . Anxiety   . Asthma    uses inhaler, last used 8/22  . Depression   . Eczema   . History of chlamydia    12/2008; 03/09/2009  . History of gonorrhea    12/2008; 09/2010;   . 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 PID (pelvic inflammatory disease)     Patient Active Problem List   Diagnosis Date Noted  . Status post tubal ligation 10/29/2017  . Asthma, chronic, mild intermittent, uncomplicated 08/29/2017  . Eczema 08/29/2017  . Anxiety 06/27/2017  . Maternal varicella, non-immune 05/06/2017  . HSV (herpes simplex virus) anogenital infection 05/01/2017    Past Surgical History:  Procedure Laterality Date  . NO PAST SURGERIES    . TUBAL LIGATION N/A 10/29/2017   Procedure: POST PARTUM TUBAL LIGATION;  Surgeon: Conan Bowensavis, Kelly M, MD;  [facility];  Service: Gynecology;  Laterality: N/A;    OB History    Gravida Para Term Preterm AB Living   5 3 3   2 3    SAB TAB Ectopic Multiple Live Births   2     0 3       Home Medications    Prior to Admission medications   Medication Sig Start Date End Date Taking? Authorizing Provider  acetaminophen (TYLENOL) 500 MG tablet Take 1,000 mg by mouth every 6 (six) hours as needed for moderate pain.     [provider]  albuterol (PROVENTIL HFA;VENTOLIN HFA) 108 (90 Base) MCG/ACT inhaler Inhale 1-2  puffs into the lungs every 6 (six) hours as needed for wheezing or shortness of breath. 06/07/17   Rasch, Victorino DikeJennifer I, NP  ibuprofen (ADVIL,MOTRIN) 600 MG tablet Take 1 tablet (600 mg total) every 6 (six) hours by mouth. Patient not taking: Reported on 01/09/2018 10/30/17   Pincus LargePhelps, Jazma Y, DO  Prenatal-DSS-FeCb-FeGl-FA (CITRANATAL BLOOM) 90-1 MG TABS Take 1 tablet by mouth daily. Patient not taking: Reported on 11/18/2017 10/15/17   Orvilla Cornwallenney, Rachelle A, CNM  sertraline (ZOLOFT) 100 MG tablet Take 1 tablet (100 mg total) by mouth daily. 11/18/17   Roe Coombsenney, Rachelle A, CNM    Family History Family History  Problem Relation Age of Onset  . Hypertension Mother   . Depression Mother   . Breast cancer Paternal Grandmother   . Cancer Paternal Grandmother        breast  . Diabetes Paternal Grandmother   . Asthma Maternal Grandmother   . Other Neg Hx     Social History Social History   Tobacco Use  . Smoking status: Current Every Day Smoker    Packs/day: 0.00    Years: 0.50    Pack years: 0.00    Types: Cigarettes    Last attempt to quit: 03/16/2017    Years since quitting: 0.8  . Smokeless tobacco: Never Used  Substance Use Topics  . Alcohol use: Yes    Alcohol/week: 0.0 oz  . Drug use: No    Comment: no more     Allergies   Eggs or egg-derived products and Latex   Review of Systems Review of Systems  All systems reviewed and negative, other than as noted in HPI.  Physical Exam Updated Vital Signs BP (!) 147/73 (BP Location: Right Arm)   Pulse 95   Temp 98 F (36.7 C) (Oral)   Resp 18   LMP 01/05/2018 (Exact Date)   SpO2 100%   Physical Exam  Constitutional: She appears well-developed and well-nourished. No distress.  HENT:  Head: Normocephalic and atraumatic.  Pharyngitis without exudate.  Uvula is midline.  Hoarse voice.  Neck is supple.  No adenopathy.  No stridor.  Eyes: Conjunctivae are normal. Right eye exhibits no discharge. Left eye exhibits no discharge.    Neck: Neck supple.  Cardiovascular: Normal rate, regular rhythm and normal heart sounds. Exam reveals no gallop and no friction rub.  No murmur heard. Pulmonary/Chest: Effort normal and breath sounds normal. No respiratory distress.  Abdominal: Soft. She exhibits no distension. There is no tenderness.  Musculoskeletal: She exhibits no edema or tenderness.  Neurological: She is alert.  Skin: Skin is warm and dry.  Psychiatric: She has a normal mood and affect. Her behavior is normal. Thought content normal.  Nursing note and vitals reviewed.    ED Treatments / Results  Labs (all labs ordered are listed, but only abnormal results are displayed) Labs Reviewed  RAPID STREP SCREEN (NOT AT [facility])  CULTURE, GROUP A STREP [facility])    EKG  EKG Interpretation None       Radiology No results found.  Procedures Procedures (including critical care time)  Medications Ordered in ED Medications  dexamethasone (DECADRON) tablet 8 mg (8 mg Oral Given 01/25/18 1732)  ibuprofen (ADVIL,MOTRIN) tablet 600 mg (600 mg Oral Given 01/25/18 1727)     Initial Impression / Assessment and Plan / ED Course  I have reviewed the triage vital signs and the nursing notes.  Pertinent labs & imaging results that were available during my care of the patient were reviewed by me and considered in my medical decision making (see chart for details).    27 year old female with pharyngitis.  Influenza possibility although I feel less likely.  Regardless, rapid strep is negative.  Symptomatic treatment.  Very low suspicion for serious deep space neck infection.  Return precautions discussed.  Final Clinical Impressions(s) / ED Diagnoses   Final diagnoses:  Sore throat    ED Discharge Orders    None       Raeford Razor, MD 01/25/18 1815

## 2018-01-28 LAB — CULTURE, GROUP A STREP (THRC)

## 2018-02-02 ENCOUNTER — Ambulatory Visit: Payer: 59 | Admitting: Obstetrics and Gynecology

## 2018-02-04 ENCOUNTER — Ambulatory Visit: Payer: 59 | Admitting: Obstetrics and Gynecology

## 2018-02-10 ENCOUNTER — Ambulatory Visit: Payer: Medicaid Other | Admitting: Certified Nurse Midwife

## 2018-02-12 ENCOUNTER — Other Ambulatory Visit: Payer: Self-pay | Admitting: Advanced Practice Midwife

## 2018-02-24 ENCOUNTER — Ambulatory Visit: Payer: 59 | Admitting: Obstetrics & Gynecology

## 2018-02-27 ENCOUNTER — Encounter (HOSPITAL_COMMUNITY): Payer: Self-pay | Admitting: *Deleted

## 2018-02-27 ENCOUNTER — Inpatient Hospital Stay (HOSPITAL_COMMUNITY)
Admission: AD | Admit: 2018-02-27 | Discharge: 2018-02-28 | Disposition: A | Discharge status: Home / Self Care | Payer: 59 | Source: Ambulatory Visit | Attending: Obstetrics & Gynecology | Admitting: Obstetrics & Gynecology

## 2018-02-27 DIAGNOSIS — R109 Unspecified abdominal pain: Secondary | ICD-10-CM | POA: Diagnosis present

## 2018-02-27 DIAGNOSIS — Z79899 Other long term (current) drug therapy: Secondary | ICD-10-CM | POA: Diagnosis not present

## 2018-02-27 DIAGNOSIS — N940 Mittelschmerz: Secondary | ICD-10-CM | POA: Insufficient documentation

## 2018-02-27 DIAGNOSIS — J45909 Unspecified asthma, uncomplicated: Secondary | ICD-10-CM | POA: Diagnosis not present

## 2018-02-27 DIAGNOSIS — F419 Anxiety disorder, unspecified: Secondary | ICD-10-CM | POA: Insufficient documentation

## 2018-02-27 DIAGNOSIS — F329 Major depressive disorder, single episode, unspecified: Secondary | ICD-10-CM | POA: Insufficient documentation

## 2018-02-27 DIAGNOSIS — F17210 Nicotine dependence, cigarettes, uncomplicated: Secondary | ICD-10-CM | POA: Insufficient documentation

## 2018-02-27 DIAGNOSIS — N898 Other specified noninflammatory disorders of vagina: Secondary | ICD-10-CM

## 2018-02-27 DIAGNOSIS — R21 Rash and other nonspecific skin eruption: Secondary | ICD-10-CM | POA: Diagnosis present

## 2018-02-27 DIAGNOSIS — N760 Acute vaginitis: Secondary | ICD-10-CM | POA: Diagnosis not present

## 2018-02-27 DIAGNOSIS — B9689 Other specified bacterial agents as the cause of diseases classified elsewhere: Secondary | ICD-10-CM

## 2018-02-27 LAB — URINALYSIS, ROUTINE W REFLEX MICROSCOPIC
Bilirubin Urine: NEGATIVE
Glucose, UA: NEGATIVE mg/dL
Hgb urine dipstick: NEGATIVE
KETONES UR: NEGATIVE mg/dL
LEUKOCYTES UA: NEGATIVE
NITRITE: NEGATIVE
PROTEIN: NEGATIVE mg/dL
Specific Gravity, Urine: 1.032 — ABNORMAL HIGH (ref 1.005–1.030)
pH: 5 (ref 5.0–8.0)

## 2018-02-27 LAB — POCT PREGNANCY, URINE: PREG TEST UR: NEGATIVE

## 2018-02-27 MED ORDER — KETOROLAC TROMETHAMINE 60 MG/2ML IM SOLN
60.0000 mg | Freq: Once | INTRAMUSCULAR | Status: AC
  Administered 2018-02-28: 60 mg via INTRAMUSCULAR
  Filled 2018-02-27: qty 2

## 2018-02-27 NOTE — MAU Note (Signed)
Abdominal pain today in ovaries but mainly R side and down groin and in R leg. For colposcopy Monday in Clinic. Foul odor "down there" but no d/c. Has rash inbetween "butt cheeks" for several days.

## 2018-02-28 LAB — CBC
HEMATOCRIT: 36.3 % (ref 36.0–46.0)
Hemoglobin: 12.2 g/dL (ref 12.0–15.0)
MCH: 30.3 pg (ref 26.0–34.0)
MCHC: 33.6 g/dL (ref 30.0–36.0)
MCV: 90.1 fL (ref 78.0–100.0)
Platelets: 271 10*3/uL (ref 150–400)
RBC: 4.03 MIL/uL (ref 3.87–5.11)
RDW: 14.7 % (ref 11.5–15.5)
WBC: 7.5 10*3/uL (ref 4.0–10.5)

## 2018-02-28 LAB — WET PREP, GENITAL
Clue Cells Wet Prep HPF POC: NONE SEEN
Sperm: NONE SEEN
Trich, Wet Prep: NONE SEEN
Yeast Wet Prep HPF POC: NONE SEEN

## 2018-02-28 MED ORDER — IBUPROFEN 800 MG PO TABS: 800.0000 mg | ORAL_TABLET | Freq: Three times a day (TID) | ORAL | 0 refills | Status: DC

## 2018-02-28 NOTE — MAU Provider Note (Signed)
History     CSN: [id number]  Arrival date and time: 02/27/18 2319   First Provider Initiated Contact with Patient 02/27/18 2356     Chief Complaint  Patient presents with  . Abdominal Pain  . Rash   HPI Lauren Conway is a 27 y.o. [phone] non pregnant female who presents with lower abdominal pain and a rash on her buttocks. She states the pain started today and she would describe it as cramping that runs down her leg. She rates the pain a 5/10 and has not tried anything for the pain. She also reports a foul smelling odor. She reports a rash on her buttocks, but she has not looked at it. Hx of HSV.   OB History    Gravida Para Term Preterm AB Living   5 3 3   2 3    SAB TAB Ectopic Multiple Live Births   2     0 3      Past Medical History:  Diagnosis Date  . Anemia   . Anxiety   . Asthma    uses inhaler, last used 8/22  . Depression   . Eczema   . History of chlamydia    12/2008; 03/09/2009  . History of gonorrhea    12/2008; 09/2010;   . 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 PID (pelvic inflammatory disease)     Past Surgical History:  Procedure Laterality Date  . TUBAL LIGATION N/A 10/29/2017   Procedure: POST PARTUM TUBAL LIGATION;  Surgeon: Conan Bowens, MD;  Location: [facility] BIRTHING SUITES;  Service: Gynecology;  Laterality: N/A;    Family History  Problem Relation Age of Onset  . Hypertension Mother   . Depression Mother   . Breast cancer Paternal Grandmother   . Cancer Paternal Grandmother        breast  . Diabetes Paternal Grandmother   . Asthma Maternal Grandmother   . Other Neg Hx     Social History   Tobacco Use  . Smoking status: Current Every Day Smoker    Packs/day: 0.00    Years: 0.50    Pack years: 0.00    Types: Cigarettes    Last attempt to quit: 03/16/2017    Years since quitting: 0.9  . Smokeless tobacco: Never Used  Substance Use Topics  . Alcohol use: Yes    Alcohol/week: 0.0 oz  . Drug use: No   Comment: no more    Allergies:  Allergies  Allergen Reactions  . Eggs Or Egg-Derived Products Hives and Swelling  . Latex Itching and Swelling    Medications Prior to Admission  Medication Sig Dispense Refill Last Dose  . acetaminophen (TYLENOL) 500 MG tablet Take 1,000 mg by mouth every 6 (six) hours as needed for moderate pain.    Taking  . albuterol (PROVENTIL HFA;VENTOLIN HFA) 108 (90 Base) MCG/ACT inhaler Inhale 1-2 puffs into the lungs every 6 (six) hours as needed for wheezing or shortness of breath. 1 Inhaler 2 Taking  . ibuprofen (ADVIL,MOTRIN) 600 MG tablet Take 1 tablet (600 mg total) every 6 (six) hours by mouth. (Patient not taking: Reported on 01/09/2018) 30 tablet 0 Not Taking  . Prenatal-DSS-FeCb-FeGl-FA (CITRANATAL BLOOM) 90-1 MG TABS Take 1 tablet by mouth daily. (Patient not taking: Reported on 11/18/2017) 30 tablet 12 Not Taking  . sertraline (ZOLOFT) 100 MG tablet Take 1 tablet (100 mg total) by mouth daily. 30 tablet 6 Taking    Review of Systems Physical  Exam   Blood pressure 135/70, pulse 87, resp. rate 18, height 5\' 4"  (1.626 m), weight 187 lb (84.8 kg), last menstrual period 02/03/2018, not currently breastfeeding.  Physical Exam  MAU Course  Procedures Results for orders placed or performed during the hospital encounter of 02/27/18 (from the past 24 hour(s))  Urinalysis, Routine w reflex microscopic     Status: Abnormal   Collection Time: 02/27/18 11:32 PM  Result Value Ref Range   Color, Urine YELLOW YELLOW   APPearance HAZY (A) CLEAR   Specific Gravity, Urine 1.032 (H) 1.005 - 1.030   pH 5.0 5.0 - 8.0   Glucose, UA NEGATIVE NEGATIVE mg/dL   Hgb urine dipstick NEGATIVE NEGATIVE   Bilirubin Urine NEGATIVE NEGATIVE   Ketones, ur NEGATIVE NEGATIVE mg/dL   Protein, ur NEGATIVE NEGATIVE mg/dL   Nitrite NEGATIVE NEGATIVE   Leukocytes, UA NEGATIVE NEGATIVE  Pregnancy, urine POC     Status: None   Collection Time: 02/27/18 11:42 PM  Result Value Ref  Range   Preg Test, Ur NEGATIVE NEGATIVE  CBC     Status: None   Collection Time: 02/28/18 12:03 AM  Result Value Ref Range   WBC 7.5 4.0 - 10.5 K/uL   RBC 4.03 3.87 - 5.11 MIL/uL   Hemoglobin 12.2 12.0 - 15.0 g/dL   HCT 16.136.3 09.636.0 - 04.546.0 %   MCV 90.1 78.0 - 100.0 fL   MCH 30.3 26.0 - 34.0 pg   MCHC 33.6 30.0 - 36.0 g/dL   RDW 40.914.7 81.111.5 - 91.415.5 %   Platelets 271 150 - 400 K/uL  Wet prep, genital     Status: Abnormal   Collection Time: 02/28/18 12:15 AM  Result Value Ref Range   Yeast Wet Prep HPF POC NONE SEEN NONE SEEN   Trich, Wet Prep NONE SEEN NONE SEEN   Clue Cells Wet Prep HPF POC NONE SEEN NONE SEEN   WBC, Wet Prep HPF POC FEW (A) NONE SEEN   Sperm NONE SEEN    MDM UA, UPT CBC Wet prep and gc/chlamydia Toradol 60mg  IM  Low suspicion for appendicitis due to location of pain and absence of fever, leukocytosis and GI complaints.  Offered patient symptomatic treatment of BV- patient declines  Assessment and Plan   1. Mittelschmerz   2. Vaginal odor    -Discharge home in stable condition -Rx for ibuprofen given to patient -Pain precautions discussed -Patient [facility] as scheduled for col[facility]copy -Patient may return to MAU as needed or if her condition were to change or worsen  Rolm BookbinderCaroline M Neill CNM 02/28/2018, 12:14 AM

## 2018-02-28 NOTE — Discharge Instructions (Signed)

## 2018-03-02 ENCOUNTER — Ambulatory Visit: Payer: 59 | Admitting: Obstetrics & Gynecology

## 2018-03-02 LAB — GC/CHLAMYDIA PROBE AMP (~~LOC~~) NOT AT ARMC
Chlamydia: NEGATIVE
NEISSERIA GONORRHEA: NEGATIVE

## 2018-03-16 ENCOUNTER — Encounter: Payer: 59 | Admitting: Obstetrics and Gynecology

## 2018-03-17 ENCOUNTER — Emergency Department (HOSPITAL_COMMUNITY)
Admission: EM | Admit: 2018-03-17 | Discharge: 2018-03-17 | Disposition: A | Discharge status: Home / Self Care | Payer: 59 | Attending: Emergency Medicine | Admitting: Emergency Medicine

## 2018-03-17 ENCOUNTER — Encounter (HOSPITAL_COMMUNITY): Payer: Self-pay

## 2018-03-17 DIAGNOSIS — J069 Acute upper respiratory infection, unspecified: Secondary | ICD-10-CM | POA: Diagnosis not present

## 2018-03-17 DIAGNOSIS — J45909 Unspecified asthma, uncomplicated: Secondary | ICD-10-CM | POA: Diagnosis not present

## 2018-03-17 DIAGNOSIS — Z91040 Latex allergy status: Secondary | ICD-10-CM | POA: Insufficient documentation

## 2018-03-17 DIAGNOSIS — R062 Wheezing: Secondary | ICD-10-CM | POA: Diagnosis present

## 2018-03-17 DIAGNOSIS — Z79899 Other long term (current) drug therapy: Secondary | ICD-10-CM | POA: Diagnosis not present

## 2018-03-17 DIAGNOSIS — F17210 Nicotine dependence, cigarettes, uncomplicated: Secondary | ICD-10-CM | POA: Diagnosis not present

## 2018-03-17 LAB — POC URINE PREG, ED: Preg Test, Ur: NEGATIVE

## 2018-03-17 MED ORDER — IPRATROPIUM-ALBUTEROL 0.5-2.5 (3) MG/3ML IN SOLN
3.0000 mL | Freq: Once | RESPIRATORY_TRACT | Status: AC
  Administered 2018-03-17: 3 mL via RESPIRATORY_TRACT
  Filled 2018-03-17: qty 3

## 2018-03-17 MED ORDER — LORATADINE 10 MG PO TABS: 10.0000 mg | ORAL_TABLET | Freq: Every day | ORAL | 0 refills | Status: DC

## 2018-03-17 MED ORDER — ALBUTEROL SULFATE HFA 108 (90 BASE) MCG/ACT IN AERS: 1.0000 | INHALATION_SPRAY | Freq: Four times a day (QID) | RESPIRATORY_TRACT | 0 refills | Status: DC | PRN

## 2018-03-17 NOTE — Discharge Instructions (Addendum)
Please read attached information. If you experience any new or worsening signs or symptoms please return to the emergency room for evaluation. Please follow-up with your primary care provider or specialist as discussed. Please use medication prescribed only as directed and discontinue taking if you have any concerning signs or symptoms.   °

## 2018-03-17 NOTE — ED Notes (Signed)
Pt ambulatory to room with steady gait

## 2018-03-17 NOTE — ED Provider Notes (Signed)
Patient placed in Quick Look pathway, seen and evaluated   Chief Complaint: flu like symptoms  HPI:   Lauren Conway is a 27 y.o. female who presents to the ED with cough and wheezing that 3 days ago and last night she began having body aches and headache. Patient reports coughing up green sputum. Patient also states that every time she turns her gas stove on her CO2 alarm goes off and she starts to wheeze. Patient is out of her inhaler.   ROS: Resp: productive cough, wheezing  Neuro: headache Physical Exam:  BP 132/82 (BP Location: Right Arm)   Pulse 86   Temp 98.9 F (37.2 C) (Oral)   Resp 18   Ht 5\' 4"  (1.626 m)   Wt 81.6 kg (180 lb)   LMP 03/04/2018 (Exact Date)   SpO2 99%   Breastfeeding? No   BMI 30.90 kg/m    Gen: No distress  Neuro: Awake and Alert  Skin: Warm and dry  Lungs: decreased breath sounds   Focused Exam:    Initiation of care has begun. The patient has been counseled on the process, plan, and necessity for staying for the completion/evaluation, and the remainder of the medical screening examination    Janne Napoleoneese, Ertha Nabor M, NP 03/17/18 Dorris [id number]    Rees, Elizabeth, MD 03/18/18 [phone]

## 2018-03-17 NOTE — ED Provider Notes (Signed)
[facility] EMERGENCY DEPARTMENT Provider Note   CSN: [id number] Arrival date & time: 03/17/18  1740  History   Chief Complaint No chief complaint on file.   HPI Lauren Conway is a 27 y.o. female.  HPI   69 YOF presents today with complaints of wheezing.  Patient reports that she has had rhinorrhea, nasal congestion and slightly sore throat over the last several days.  She notes some wheezing in association with this that has improved today at arrival.  She denies any fever, productive cough, chest pain, or significant shortness of breath.  Patient reports she has run out of her inhaler at home.   Past Medical History:  Diagnosis Date  . Anemia   . Anxiety   . Asthma    uses inhaler, last used 8/22  . Depression   . Eczema   . History of chlamydia    12/2008; 03/09/2009  . History of gonorrhea    12/2008; 09/2010;   . 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 PID (pelvic inflammatory disease)     Patient Active Problem List   Diagnosis Date Noted  . Status post tubal ligation 10/29/2017  . Asthma, chronic, mild intermittent, uncomplicated 08/29/2017  . Eczema 08/29/2017  . Anxiety 06/27/2017  . Maternal varicella, non-immune 05/06/2017  . HSV (herpes simplex virus) anogenital infection 05/01/2017    Past Surgical History:  Procedure Laterality Date  . TUBAL LIGATION N/A 10/29/2017   Procedure: POST PARTUM TUBAL LIGATION;  Surgeon: Conan Bowens, MD;  Location: [facility] BIRTHING SUITES;  Service: Gynecology;  Laterality: N/A;     OB History    Gravida  5   Para  3   Term  3   Preterm      AB  2   Living  3     SAB  2   TAB      Ectopic      Multiple  0   Live Births  3            Home Medications    Prior to Admission medications   Medication Sig Start Date End Date Taking? Authorizing Provider  acetaminophen (TYLENOL) 500 MG tablet Take 1,000 mg by mouth every 6 (six) hours as needed for moderate  pain.     [provider]  albuterol (PROVENTIL HFA;VENTOLIN HFA) 108 (90 Base) MCG/ACT inhaler Inhale 1-2 puffs into the lungs every 6 (six) hours as needed for wheezing or shortness of breath. 03/17/18   Nica Friske, Tinnie Gens, PA-C  ibuprofen (ADVIL,MOTRIN) 800 MG tablet Take 1 tablet (800 mg total) by mouth 3 (three) times daily. 02/28/18   Rolm Bookbinder, CNM  loratadine (CLARITIN) 10 MG tablet Take 1 tablet (10 mg total) by mouth daily. 03/17/18   Voula Waln, Tinnie Gens, PA-C  Prenatal-DSS-FeCb-FeGl-FA (CITRANATAL BLOOM) 90-1 MG TABS Take 1 tablet by mouth daily. Patient not taking: Reported on 11/18/2017 10/15/17   Orvilla Cornwall A, CNM  sertraline (ZOLOFT) 100 MG tablet Take 1 tablet (100 mg total) by mouth daily. 11/18/17   Roe Coombs, CNM    Family History Family History  Problem Relation Age of Onset  . Hypertension Mother   . Depression Mother   . Breast cancer Paternal Grandmother   . Cancer Paternal Grandmother        breast  . Diabetes Paternal Grandmother   . Asthma Maternal Grandmother   . Other Neg Hx     Social History Social History  Tobacco Use  . Smoking status: Current Every Day Smoker    Packs/day: 0.00    Years: 0.50    Pack years: 0.00    Types: Cigarettes    Last attempt to quit: 03/16/2017    Years since quitting: 1.0  . Smokeless tobacco: Never Used  Substance Use Topics  . Alcohol use: Yes    Alcohol/week: 0.0 oz  . Drug use: No    Types: Marijuana    Comment: no more     Allergies   Eggs or egg-derived products and Latex   Review of Systems Review of Systems  All other systems reviewed and are negative.  Physical Exam Updated Vital Signs BP 132/82 (BP Location: Right Arm)   Pulse 86   Temp 98.9 F (37.2 C) (Oral)   Resp 18   Ht 5\' 4"  (1.626 m)   Wt 81.6 kg (180 lb)   LMP 03/04/2018 (Exact Date)   SpO2 99%   Breastfeeding? No   BMI 30.90 kg/m   Physical Exam  Constitutional: She is oriented to person, place, and time.  She appears well-developed and well-nourished.  HENT:  Head: Normocephalic and atraumatic.  Right Ear: Hearing, external ear and ear canal normal.  Left Ear: Hearing, tympanic membrane, external ear and ear canal normal.  Nose: Rhinorrhea present.  Cerumen impaction right external auditory canal  Eyes: Pupils are equal, round, and reactive to light. Conjunctivae are normal. Right eye exhibits no discharge. Left eye exhibits no discharge. No scleral icterus.  Neck: Normal range of motion. No JVD present. No tracheal deviation present.  Pulmonary/Chest: Effort normal. No stridor.  Neurological: She is alert and oriented to person, place, and time. Coordination normal.  Psychiatric: She has a normal mood and affect. Her behavior is normal. Judgment and thought content normal.  Nursing note and vitals reviewed.    ED Treatments / Results  Labs (all labs ordered are listed, but only abnormal results are displayed) Labs Reviewed - No data to display  EKG None  Radiology No results found.  Procedures Procedures (including critical care time)  Medications Ordered in ED Medications  ipratropium-albuterol (DUONEB) 0.5-2.5 (3) MG/3ML nebulizer solution 3 mL (3 mLs Nebulization Given 03/17/18 1800)    Initial Impression / Assessment and Plan / ED Course  I have reviewed the triage vital signs and the nursing notes.  Pertinent labs & imaging results that were available during my care of the patient were reviewed by me and considered in my medical decision making (see chart for details).    Final Clinical Impressions(s) / ED Diagnoses   Final diagnoses:  Viral URI   Labs:   Imaging:  Consults:  Therapeutics: DuoNeb  Discharge Meds: Albuterol, Claritin  Assessment/Plan: 27 year old female presents today with likely viral URI.  She is well-appearing in no acute distress.  She has clear lung sounds, she received a breathing treatment prior to my evaluation.  Patient is afebrile  low suspicion for bacterial infection no severe asthma exacerbation.  Patient discharged with antihistamines, albuterol inhaler and strict return precautions.  Patient verbalized understanding and agreement to today's plan had no further questions or concerns.   ED Discharge Orders        Ordered    loratadine (CLARITIN) 10 MG tablet  Daily     03/17/18 1841    albuterol (PROVENTIL HFA;VENTOLIN HFA) 108 (90 Base) MCG/ACT inhaler  Every 6 hours PRN     03/17/18 1841       Abimelec Grochowski, Tinnie Gens, PA-C  03/17/18 1846    Bethann BerkshireZammit, Joseph, MD 03/18/18 1009

## 2018-03-17 NOTE — ED Triage Notes (Addendum)
Pt presents with wheezing that began last night with coughing - reports blood-tinged green phlegm; h/o asthma and out of inhalers.  Reports everytime she turns on her gas stove, her CO2 alarm goes off and she begins to wheeze.  Pt reports daily headaches.

## 2018-03-31 ENCOUNTER — Encounter: Payer: 59 | Admitting: Obstetrics & Gynecology

## 2018-05-04 ENCOUNTER — Inpatient Hospital Stay (HOSPITAL_COMMUNITY)
Admission: AD | Admit: 2018-05-04 | Discharge: 2018-05-04 | Disposition: A | Discharge status: Home / Self Care | Payer: Self-pay | Source: Ambulatory Visit | Attending: Obstetrics & Gynecology | Admitting: Obstetrics & Gynecology

## 2018-05-04 ENCOUNTER — Encounter (HOSPITAL_COMMUNITY): Payer: Self-pay | Admitting: *Deleted

## 2018-05-04 DIAGNOSIS — Z91012 Allergy to eggs: Secondary | ICD-10-CM | POA: Insufficient documentation

## 2018-05-04 DIAGNOSIS — J45909 Unspecified asthma, uncomplicated: Secondary | ICD-10-CM | POA: Insufficient documentation

## 2018-05-04 DIAGNOSIS — Z79899 Other long term (current) drug therapy: Secondary | ICD-10-CM | POA: Insufficient documentation

## 2018-05-04 DIAGNOSIS — R1084 Generalized abdominal pain: Secondary | ICD-10-CM

## 2018-05-04 DIAGNOSIS — Z803 Family history of malignant neoplasm of breast: Secondary | ICD-10-CM | POA: Insufficient documentation

## 2018-05-04 DIAGNOSIS — F329 Major depressive disorder, single episode, unspecified: Secondary | ICD-10-CM | POA: Insufficient documentation

## 2018-05-04 DIAGNOSIS — Z9851 Tubal ligation status: Secondary | ICD-10-CM | POA: Insufficient documentation

## 2018-05-04 DIAGNOSIS — R1030 Lower abdominal pain, unspecified: Secondary | ICD-10-CM | POA: Insufficient documentation

## 2018-05-04 DIAGNOSIS — Z833 Family history of diabetes mellitus: Secondary | ICD-10-CM | POA: Insufficient documentation

## 2018-05-04 DIAGNOSIS — F419 Anxiety disorder, unspecified: Secondary | ICD-10-CM | POA: Insufficient documentation

## 2018-05-04 DIAGNOSIS — F17210 Nicotine dependence, cigarettes, uncomplicated: Secondary | ICD-10-CM | POA: Insufficient documentation

## 2018-05-04 DIAGNOSIS — Z8249 Family history of ischemic heart disease and other diseases of the circulatory system: Secondary | ICD-10-CM | POA: Insufficient documentation

## 2018-05-04 DIAGNOSIS — Z91040 Latex allergy status: Secondary | ICD-10-CM | POA: Insufficient documentation

## 2018-05-04 DIAGNOSIS — Z825 Family history of asthma and other chronic lower respiratory diseases: Secondary | ICD-10-CM | POA: Insufficient documentation

## 2018-05-04 DIAGNOSIS — Z818 Family history of other mental and behavioral disorders: Secondary | ICD-10-CM | POA: Insufficient documentation

## 2018-05-04 LAB — URINALYSIS, ROUTINE W REFLEX MICROSCOPIC
BILIRUBIN URINE: NEGATIVE
GLUCOSE, UA: NEGATIVE mg/dL
KETONES UR: NEGATIVE mg/dL
LEUKOCYTES UA: NEGATIVE
Nitrite: NEGATIVE
PH: 6 (ref 5.0–8.0)
PROTEIN: NEGATIVE mg/dL
Specific Gravity, Urine: 1.01 (ref 1.005–1.030)

## 2018-05-04 LAB — WET PREP, GENITAL
Clue Cells Wet Prep HPF POC: NONE SEEN
SPERM: NONE SEEN
TRICH WET PREP: NONE SEEN
YEAST WET PREP: NONE SEEN

## 2018-05-04 LAB — URINALYSIS, MICROSCOPIC (REFLEX)

## 2018-05-04 LAB — CBC
HCT: 37.5 % (ref 36.0–46.0)
Hemoglobin: 12.4 g/dL (ref 12.0–15.0)
MCH: 30.5 pg (ref 26.0–34.0)
MCHC: 33.1 g/dL (ref 30.0–36.0)
MCV: 92.1 fL (ref 78.0–100.0)
PLATELETS: 343 10*3/uL (ref 150–400)
RBC: 4.07 MIL/uL (ref 3.87–5.11)
RDW: 12.5 % (ref 11.5–15.5)
WBC: 8.4 10*3/uL (ref 4.0–10.5)

## 2018-05-04 LAB — POCT PREGNANCY, URINE: Preg Test, Ur: NEGATIVE

## 2018-05-04 MED ORDER — KETOROLAC TROMETHAMINE 60 MG/2ML IM SOLN
60.0000 mg | Freq: Once | INTRAMUSCULAR | Status: AC
  Administered 2018-05-04: 60 mg via INTRAMUSCULAR
  Filled 2018-05-04: qty 2

## 2018-05-04 NOTE — MAU Note (Signed)
Pt reports abdominal pain on R side, radiating down into her thigh. Pain is dull and achy. This pain has been there all day. Pain started after intercourse, but she states she's never had pain like that before.

## 2018-05-04 NOTE — MAU Note (Signed)
Pt reports lower abdominal pain since 5am yesterday morning, now radiating down her right leg. States the pain is a constant, dull, achy pain. Rates 7/10. Has not taken anything for pain. Pt denies vaginal bleeding or discharge. Pt denies UTI symptoms. Last BM was yesterday and normal. Pt had a BTL in November and reports she had an abnormal pap and had to re-schedule her appt x2 due to childcare. Has not followed up for colposcopy and worried this pain may be related to abnormal pap. LMP: 04/28/2018

## 2018-05-04 NOTE — MAU Provider Note (Signed)
History     CSN: [id number]  Arrival date and time: 05/04/18 0416   First Provider Initiated Contact with Patient 05/04/18 0510     Chief Complaint  Patient presents with  . Abdominal Pain  . Nausea   HPI Lauren Conway is a 27 y.o. [id number] non pregnant female who presents with lower abdominal pain. She states the pain started yesterday at 0500 after intercourse. She rates the pain a 7/10 and has not tried anything for it. She describes it as dull, achy and runs down her leg intermittently. She denies any nausea and vomiting. Denies dysuria, constipation or diarrhea. She thinks the pain is related to her abnormal pap smear. LMP 04/28/18  OB History    Gravida  5   Para  3   Term  3   Preterm      AB  2   Living  3     SAB  2   TAB      Ectopic      Multiple  0   Live Births  3           Past Medical History:  Diagnosis Date  . Anemia   . Anxiety   . Asthma    uses inhaler, last used 8/22  . Depression   . Eczema   . History of chlamydia    12/2008; 03/09/2009  . History of gonorrhea    12/2008; 09/2010;   . 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 PID (pelvic inflammatory disease)     Past Surgical History:  Procedure Laterality Date  . TUBAL LIGATION N/A 10/29/2017   Procedure: POST PARTUM TUBAL LIGATION;  Surgeon: Conan Bowens, MD;  Location: [facility] BIRTHING SUIT[facility];  Service: Gynecology;  Laterality: N/A;    Family History  Problem Relation Age of Onset  . Hypertension Mother   . Depression Mother   . Breast cancer Paternal Grandmother   . Cancer Paternal Grandmother        breast  . Diabetes Paternal Grandmother   . Asthma Maternal Grandmother   . Other Neg Hx     Social History   Tobacco Use  . Smoking status: Current Every Day Smoker    Packs/day: 0.00    Years: 0.50    Pack years: 0.00    Types: Cigarettes    Last attempt to quit: 03/16/2017    Years since quitting: 1.1  . Smokeless tobacco: Never Used   Substance Use Topics  . Alcohol use: Yes    Alcohol/week: 0.0 oz  . Drug use: No    Types: Marijuana    Comment: no more    Allergies:  Allergies  Allergen Reactions  . Eggs Or Egg-Derived Products Hives and Swelling  . Latex Itching and Swelling    Medications Prior to Admission  Medication Sig Dispense Refill Last Dose  . acetaminophen (TYLENOL) 500 MG tablet Take 1,000 mg by mouth every 6 (six) hours as needed for moderate pain.    Past Month at Unknown time  . albuterol (PROVENTIL HFA;VENTOLIN HFA) 108 (90 Base) MCG/ACT inhaler Inhale 1-2 puffs into the lungs every 6 (six) hours as needed for wheezing or shortness of breath. 1 Inhaler 0 Past Week at Unknown time  . ibuprofen (ADVIL,MOTRIN) 800 MG tablet Take 1 tablet (800 mg total) by mouth 3 (three) times daily. 30 tablet 0 Past Week at Unknown time  . loratadine (CLARITIN) 10 MG tablet Take 1 tablet (10 mg  total) by mouth daily. 30 tablet 0 Past Month at Unknown time  . Prenatal-DSS-FeCb-FeGl-FA (CITRANATAL BLOOM) 90-1 MG TABS Take 1 tablet by mouth daily. (Patient not taking: Reported on 11/18/2017) 30 tablet 12 Not Taking  . sertraline (ZOLOFT) 100 MG tablet Take 1 tablet (100 mg total) by mouth daily. 30 tablet 6 More than a month at Unknown time    Review of Systems  Constitutional: Negative.  Negative for fatigue and fever.  HENT: Negative.   Respiratory: Negative.  Negative for shortness of breath.   Cardiovascular: Negative.  Negative for chest pain.  Gastrointestinal: Positive for abdominal pain. Negative for constipation, diarrhea, nausea and vomiting.  Genitourinary: Negative.  Negative for dysuria, vaginal bleeding and vaginal discharge.  Neurological: Negative.  Negative for dizziness and headaches.   Physical Exam   Blood pressure 115/73, pulse 66, temperature 98.2 F (36.8 C), temperature source Oral, resp. rate 16, height  (1.626 m), weight 187 lb (84.8 kg), last menstrual period 04/28/2018, SpO2 98 %,  not currently breastfeeding.  Physical Exam  Nursing note and vitals reviewed. Constitutional: She is oriented to person, place, and time. She appears well-developed and well-nourished. No distress.  HENT:  Head: Normocephalic.  Eyes: Pupils are equal, round, and reactive to light.  Cardiovascular: Normal rate, regular rhythm and normal heart sounds.  Respiratory: Effort normal and breath sounds normal. No respiratory distress.  GI: Soft. Bowel sounds are normal. She exhibits no distension. There is no tenderness. There is no rebound and no guarding.  Genitourinary:  Genitourinary Comments: Bimanual exam: Cervix 0/long/high, firm, anterior, neg CMT, uterus nontender, nonenlarged, adnexa without tenderness, enlargement, or mass   Neurological: She is alert and oriented to person, place, and time.  Skin: Skin is warm and dry.  Psychiatric: She has a normal mood and affect. Her behavior is normal. Judgment and thought content normal.    MAU Course  Procedures Results for orders placed or performed during the hospital encounter of 05/04/18 (from the past 24 hour(s))  Urinalysis, Routine w reflex microscopic     Status: Abnormal   Collection Time: 05/04/18  4:32 AM  Result Value Ref Range   Color, Urine YELLOW YELLOW   APPearance CLEAR CLEAR   Specific Gravity, Urine 1.010 1.005 - 1.030   pH 6.0 5.0 - 8.0   Glucose, UA NEGATIVE NEGATIVE mg/dL   Hgb urine dipstick TRACE (A) NEGATIVE   Bilirubin Urine NEGATIVE NEGATIVE   Ketones, ur NEGATIVE NEGATIVE mg/dL   Protein, ur NEGATIVE NEGATIVE mg/dL   Nitrite NEGATIVE NEGATIVE   Leukocytes, UA NEGATIVE NEGATIVE  Urinalysis, Microscopic (reflex)     Status: Abnormal   Collection Time: 05/04/18  4:32 AM  Result Value Ref Range   RBC / HPF 0-5 0 - 5 RBC/hpf   WBC, UA 0-5 0 - 5 WBC/hpf   Bacteria, UA RARE (A) NONE SEEN   Squamous Epithelial / LPF 0-5 0 - 5  Pregnancy, urine POC     Status: None   Collection Time: 05/04/18  4:47 AM  Result  Value Ref Range   Preg Test, Ur NEGATIVE NEGATIVE  Wet prep, genital     Status: Abnormal   Collection Time: 05/04/18  5:17 AM  Result Value Ref Range   Yeast Wet Prep HPF POC NONE SEEN NONE SEEN   Trich, Wet Prep NONE SEEN NONE SEEN   Clue Cells Wet Prep HPF POC NONE SEEN NONE SEEN   WBC, Wet Prep HPF POC MODERATE (A) NONE SEEN  Sperm NONE SEEN   CBC     Status: None   Collection Time: 05/04/18  5:47 AM  Result Value Ref Range   WBC 8.4 4.0 - 10.5 K/uL   RBC 4.07 3.87 - 5.11 MIL/uL   Hemoglobin 12.4 12.0 - 15.0 g/dL   HCT 16.1 09.6 - 04.5 %   MCV 92.1 78.0 - 100.0 fL   MCH 30.5 26.0 - 34.0 pg   MCHC 33.1 30.0 - 36.0 g/dL   RDW 40.9 81.1 - 91.4 %   Platelets 343 150 - 400 K/uL   MDM UA, UPT CBC Wet prep and gc/chlamydia Toradol  IM Low suspicion for appendicitis due to location of pain and absence of fever, leukocytosis and GI complaints.   Assessment and Plan   1. Generalized abdominal pain    -Discharge home in stable condition -Encouraged patient to use tylenol and ibuprofen for pain as needed -Appendicitis precautions discussed -Patient advised to follow-up with [facility] or [facility] if pain worsens.  -Encouraged patient to establish care with PCP for ongoing management of abdominal pain. Discussed possibility of needing referral to GI if pain continues long term. -Reasons to return to MAU reviewed with patient.    Rolm Bookbinder CNM 05/04/2018, 5:10 AM

## 2018-05-04 NOTE — Discharge Instructions (Signed)
In late 2019, the [facility] will be moving to the [facility] campus. At that time, the MAU (Maternity Admissions Unit), where you are being seen today, will no longer take care of non-pregnant patients. We strongly encourage you to find a doctor's office before that time, so that you can be seen with any GYN concerns, like vaginal discharge, urinary tract infection, etc.. in a timely manner.  In order to make an office visit more convenient, the Center for [facility] at [facility] will be offering evening hours with same-day appointments, walk-in appointments and scheduled appointments available during this time.  Center for [facility] @ [facility] Hours: Monday - 8am - 7:30 pm with walk-in between 4pm- 7:30 pm Tuesday - 8 am - 5 pm (starting 03/17/18 we will be open late and accepting walk-ins from 4pm - 7:30pm) Wednesday - 8 am - 5 pm (starting 06/17/18 we will be open late and accepting walk-ins from 4pm - 7:30pm) Thursday 8 am - 5 pm (starting 09/17/18 we will be open late and accepting walk-ins from 4pm - 7:30pm) Friday 8 am - 5 pm  For an appointment please call the Center for [facility] @ [facility] at [phone]  For urgent needs, [facility] is also available for management of urgent GYN complaints such as vaginal discharge or urinary tract infections.      Abdominal Pain, Adult Abdominal pain can be caused by many things. Often, abdominal pain is not serious and it gets better with no treatment or by being treated at home. However, sometimes abdominal pain is serious. Your health care provider will do a medical history and a physical exam to try to determine the cause of your abdominal pain. Follow these instructions at home:  Take over-the-counter and prescription medicines only as told by your health care provider. Do not take a laxative unless told by your health care provider.  Drink enough fluid to keep your urine clear  or pale yellow.  Watch your condition for any changes.  Keep all follow-up visits as told by your health care provider. This is important. Contact a health care provider if:  Your abdominal pain changes or gets worse.  You are not hungry or you lose weight without trying.  You are constipated or have diarrhea for more than 2-3 days.  You have pain when you urinate or have a bowel movement.  Your abdominal pain wakes you up at night.  Your pain gets worse with meals, after eating, or with certain foods.  You are throwing up and cannot keep anything down.  You have a fever. Get help right away if:  Your pain does not go away as soon as your health care provider told you to expect.  You cannot stop throwing up.  Your pain is only in areas of the abdomen, such as the right side or the left lower portion of the abdomen.  You have bloody or black stools, or stools that look like tar.  You have severe pain, cramping, or bloating in your abdomen.  You have signs of dehydration, such as: ? Dark urine, very little urine, or no urine. ? Cracked lips. ? Dry mouth. ? Sunken eyes. ? Sleepiness. ? Weakness. This information is not intended to replace advice given to you by your health care provider. Make sure you discuss any questions you have with your health care provider. Document Released: 09/11/2005 Document Revised: 06/21/2016 Document Reviewed: 05/15/2016 Elsevier Interactive Patient Education  2018  Elsevier Inc. ° °

## 2018-05-05 ENCOUNTER — Ambulatory Visit (HOSPITAL_COMMUNITY)
Admission: RE | Admit: 2018-05-05 | Discharge: 2018-05-05 | Disposition: A | Discharge status: Home / Self Care | Payer: Self-pay | Source: Ambulatory Visit | Attending: Obstetrics and Gynecology | Admitting: Obstetrics and Gynecology

## 2018-05-05 ENCOUNTER — Encounter (HOSPITAL_COMMUNITY): Payer: Self-pay | Admitting: *Deleted

## 2018-05-05 VITALS — BP 114/72 | Ht 64.0 in | Wt 190.0 lb

## 2018-05-05 DIAGNOSIS — Z1239 Encounter for other screening for malignant neoplasm of breast: Secondary | ICD-10-CM

## 2018-05-05 DIAGNOSIS — R87612 Low grade squamous intraepithelial lesion on cytologic smear of cervix (LGSIL): Secondary | ICD-10-CM

## 2018-05-05 LAB — GC/CHLAMYDIA PROBE AMP (~~LOC~~) NOT AT ARMC
Chlamydia: NEGATIVE
Neisseria Gonorrhea: NEGATIVE

## 2018-05-05 NOTE — Progress Notes (Signed)
Patient referred to [facility] by the Center for [facility] due to having an abnormal Pap smear on 01/09/2018 that a colposcopy is recommended for follow-up.   Pap Smear: Pap smear not completed today. Last Pap smear was 01/09/2018 at [facility] and LGSIL. Referred patient to the Center for [facility] for a colposcopy for follow-up of abnormal Pap smear. Appointment scheduled for Friday, June 12, 2018 at 0935. Per patient her most recent Pap smear is the only abnormal Pap smear she has had. Last three Pap smear results are in Epic.  Physical exam: Breasts Breasts symmetrical. No skin abnormalities bilateral breasts. No nipple retraction bilateral breasts. No nipple discharge bilateral breasts. No lymphadenopathy. No lumps palpated bilateral breasts. No complaints of pain or tenderness on exam. Screening mammogram recommended at age 24 unless clinically indicated prior.     Pelvic/Bimanual No Pap smear completed today since last Pap smear was 01/09/2018. Pap smear not indicated per BCCCP guidelines.   Smoking History: Patient is a current smoker. Discussed smoking cessation with patient. Referred patient to the [facility] Quitline and gave resources to the free smoking cessation classes at [facility].  Patient Navigation: Patient education provided. Access to services provided for patient through BCCCP program.   Breast and Cervical Cancer Risk Assessment: Patient has a family history of her paternal grandmother having breast cancer. Patient has no known genetic mutations or history of radiation treatment to the chest before age 25. Patient has no history of cervical dysplasia, immunocompromised, or DES exposure in-utero. The breast cancer risk assessment completed but did not result due to patient being younger than 87.

## 2018-05-05 NOTE — Patient Instructions (Signed)
Explained breast self awareness with Lauren Conway. Patient did not need a Pap smear today due to last Pap smear was 01/09/2018. Explained the colposcopy with patient the recommended follow-up for her abnormal Pap smear. Referred patient to the Center for [facility] for a colposcopy for follow-up of abnormal Pap smear. Appointment scheduled for Friday, June 12, 2018 at 0935. Patient aware of appointment and will be there. Let patient know she will need a screening mammogram at age 89 unless clinically indicated prior. Discussed smoking cessation with patient. Referred patient to the [facility] Quitline and gave resources to the free smoking cessation classes at [facility]. Jacinta Penalver verbalized understanding.  Abbrielle Batts, Kathaleen Maser, RN 10:41 AM

## 2018-05-22 ENCOUNTER — Encounter (HOSPITAL_COMMUNITY): Payer: Self-pay | Admitting: *Deleted

## 2018-06-12 ENCOUNTER — Ambulatory Visit (INDEPENDENT_AMBULATORY_CARE_PROVIDER_SITE_OTHER): Payer: Self-pay | Admitting: Obstetrics and Gynecology

## 2018-06-12 ENCOUNTER — Other Ambulatory Visit (HOSPITAL_COMMUNITY)
Admission: RE | Admit: 2018-06-12 | Discharge: 2018-06-12 | Disposition: A | Discharge status: Home / Self Care | Payer: Self-pay | Source: Ambulatory Visit | Attending: Obstetrics and Gynecology | Admitting: Obstetrics and Gynecology

## 2018-06-12 ENCOUNTER — Encounter: Payer: Self-pay | Admitting: Obstetrics and Gynecology

## 2018-06-12 VITALS — BP 121/83 | HR 90 | Ht 64.0 in | Wt 191.2 lb

## 2018-06-12 DIAGNOSIS — Z113 Encounter for screening for infections with a predominantly sexual mode of transmission: Secondary | ICD-10-CM

## 2018-06-12 DIAGNOSIS — N898 Other specified noninflammatory disorders of vagina: Secondary | ICD-10-CM

## 2018-06-12 DIAGNOSIS — R87612 Low grade squamous intraepithelial lesion on cytologic smear of cervix (LGSIL): Secondary | ICD-10-CM | POA: Insufficient documentation

## 2018-06-12 LAB — POCT PREGNANCY, URINE: PREG TEST UR: NEGATIVE

## 2018-06-12 NOTE — Patient Instructions (Signed)
Colposcopy, Care After  This sheet gives you information about how to care for yourself after your procedure. Your doctor may also give you more specific instructions. If you have problems or questions, contact your doctor.  What can I expect after the procedure?  If you did not have a tissue sample removed (did not have a biopsy), you may only have some spotting for a few days. You can go back to your normal activities.  If you had a tissue sample removed, it is common to have:  · Soreness and pain. This may last for a few days.  · Light-headedness.  · Mild bleeding from your vagina or dark-colored, grainy discharge from your vagina. This may last for a few days. You may need to wear a sanitary pad.  · Spotting for at least 48 hours after the procedure.    Follow these instructions at home:  · Take over-the-counter and prescription medicines only as told by your doctor. Ask your doctor what medicines you can start taking again. This is very important if you take blood-thinning medicine.  · Do not drive or use heavy machinery while taking prescription pain medicine.  · For 3 days, or as long as your doctor tells you, avoid:  ? Douching.  ? Using tampons.  ? Having sex.  · If you use birth control (contraception), keep using it.  · Limit activity for the first day after the procedure. Ask your doctor what activities are safe for you.  · It is up to you to get the results of your procedure. Ask your doctor when your results will be ready.  · Keep all follow-up visits as told by your doctor. This is important.  Contact a doctor if:  · You get a skin rash.  Get help right away if:  · You are bleeding a lot from your vagina. It is a lot of bleeding if you are using more than one pad an hour for 2 hours in a row.  · You have clumps of blood (blood clots) coming from your vagina.  · You have a fever.  · You have chills  · You have pain in your lower belly (pelvic area).  · You have signs of infection, such as vaginal  discharge that is:  ? Different than usual.  ? Yellow.  ? Bad-smelling.  · You have very pain or cramps in your lower belly that do not get better with medicine.  · You feel light-headed.  · You feel dizzy.  · You pass out (faint).  Summary  · If you did not have a tissue sample removed (did not have a biopsy), you may only have some spotting for a few days. You can go back to your normal activities.  · If you had a tissue sample removed, it is common to have mild pain and spotting for 48 hours.  · For 3 days, or as long as your doctor tells you, avoid douching, using tampons and having sex.  · Get help right away if you have bleeding, very bad pain, or signs of infection.  This information is not intended to replace advice given to you by your health care provider. Make sure you discuss any questions you have with your health care provider.  Document Released: 05/20/2008 Document Revised: 08/21/2016 Document Reviewed: 08/21/2016  Elsevier Interactive Patient Education © 2018 Elsevier Inc.

## 2018-06-12 NOTE — Progress Notes (Signed)
Pt states having Pelvic pain w/ discharge no odor.

## 2018-06-12 NOTE — Progress Notes (Signed)
Patient ID: Deri FuellingJatavia Conway, female   DOB: 11/05/1991, 27 y.o.   MRN: [id number]    GYNECOLOGY CLINIC COLPOSCOPY PROCEDURE NOTE  27 y.o. [id number] here for colposcopy for low-grade squamous intraepithelial neoplasia (LGSIL - encompassing HPV,mild dysplasia,CIN I) pap smear on 1/19. Discussed role for HPV in cervical dysplasia, need for surveillance. Also some vaginal discharge/odor. H/O BV. UPT negative No history of abnormal pap smear  Patient given informed consent, signed copy in the chart, time out was performed.  Placed in lithotomy position. Cervix viewed with speculum and colposcope after application of acetic acid.   Colposcopy adequate? Yes  acetowhite lesion(s) noted at 12 and 6 o'clock; corresponding biopsies obtained.  ECC specimen obtained. Monsel's solution applied.  All specimens were labelled and sent to pathology.   Patient was given post procedure instructions.  Will follow up pathology and manage accordingly.  Routine preventative health maintenance measures emphasized.    Hermina StaggersMichael L Taksh Hjort, MD, [facility], [facility]

## 2018-06-15 LAB — CERVICOVAGINAL ANCILLARY ONLY
BACTERIAL VAGINITIS: NEGATIVE
Candida vaginitis: NEGATIVE
Chlamydia: NEGATIVE
NEISSERIA GONORRHEA: NEGATIVE
Trichomonas: NEGATIVE

## 2018-06-18 ENCOUNTER — Encounter: Payer: Self-pay | Admitting: Obstetrics and Gynecology

## 2018-06-19 ENCOUNTER — Telehealth: Payer: Self-pay

## 2018-06-19 ENCOUNTER — Telehealth: Payer: Self-pay | Admitting: General Practice

## 2018-06-19 NOTE — Telephone Encounter (Signed)
Patient called and left message on nurse voicemail line stating she was here last Friday and had a colposcopy and a swab done. Patient states her results are not in mychart yet. Called patient & reviewed results with her- discussed I'm not certain what her follow up on the colposcopy will need to be but we will call her back whenever we hear from Dr Alysia PennaErvin. Patient verbalized understanding & had no questions.

## 2018-06-19 NOTE — Telephone Encounter (Signed)
Happy 4th ... Hey just wanting to know when would I have my results from the colposcopy and the results from my swab for gc/Chlamydia? If you could please have someone give me a call or message me back I'd greatly appreciate it.   Returned call to patient. No answer. Left message for pt to return call to office

## 2018-06-23 ENCOUNTER — Telehealth: Payer: Self-pay

## 2018-06-23 NOTE — Telephone Encounter (Signed)
-----   Message from Hermina StaggersMichael L Ervin, MD sent at 06/22/2018 10:39 AM EDT ----- Please let pt know that her cervical BX only showed HPV only no abnormal cells. Recommend repeat pap smear with HPV co testing in 1 yr. Thanks Casimiro NeedleMichael

## 2018-06-23 NOTE — Telephone Encounter (Signed)
Called patient to inform her of test results and need to have co-testing in a year. Patient verbalized understanding at this time.

## 2018-06-26 ENCOUNTER — Encounter: Payer: Self-pay | Admitting: *Deleted

## 2018-08-22 IMAGING — US US OB TRANSVAGINAL
1 series · 15 of 28 positions shown · non-contrast
Comparison: 04/25/2015

CLINICAL DATA: Abdominal cramping during pregnancy.

EXAM:
OBSTETRIC <14 WK US AND TRANSVAGINAL OB US
TECHNIQUE: Both transabdominal and transvaginal ultrasound examinations were
performed for complete evaluation of the gestation as well as the
maternal uterus, adnexal regions, and pelvic cul-de-sac.
Transvaginal technique was performed to assess early pregnancy.

[Series 1: us ob transvaginal · 72 acquisitions, 15 frames shown]
[im 1/72]
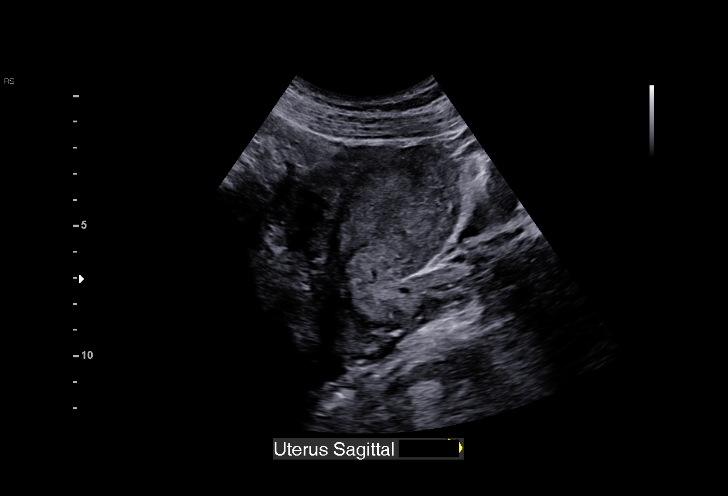
[im 6/72]
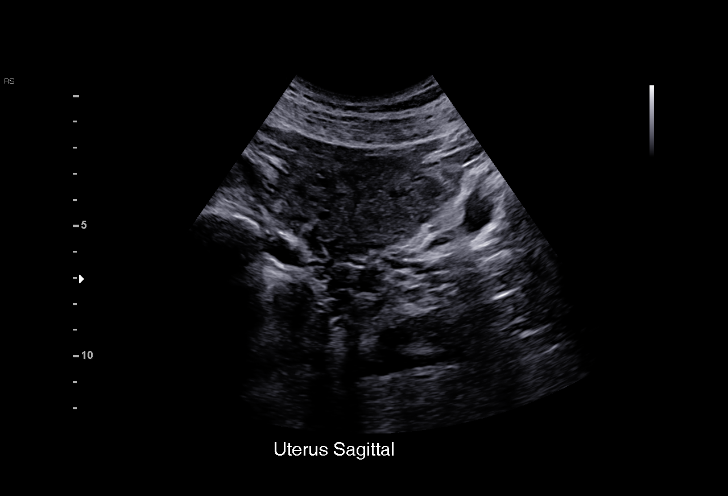
[im 11/72]
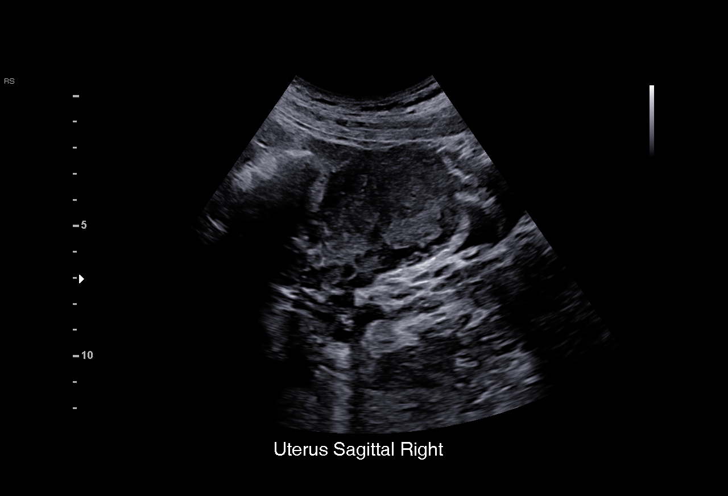
[im 16/72]
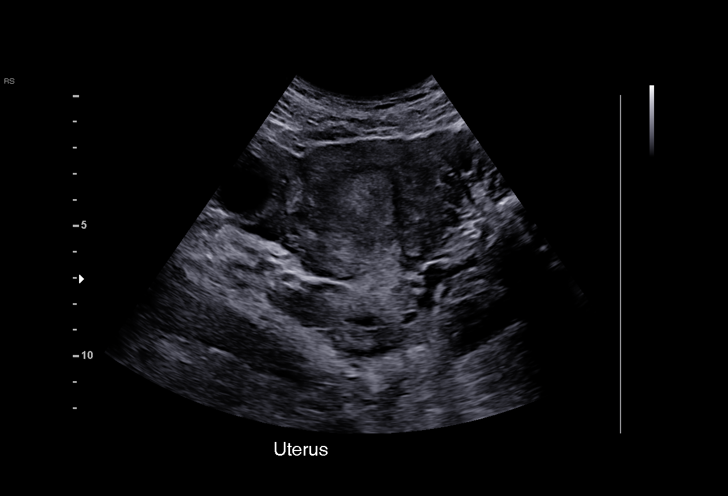
[im 22/72]
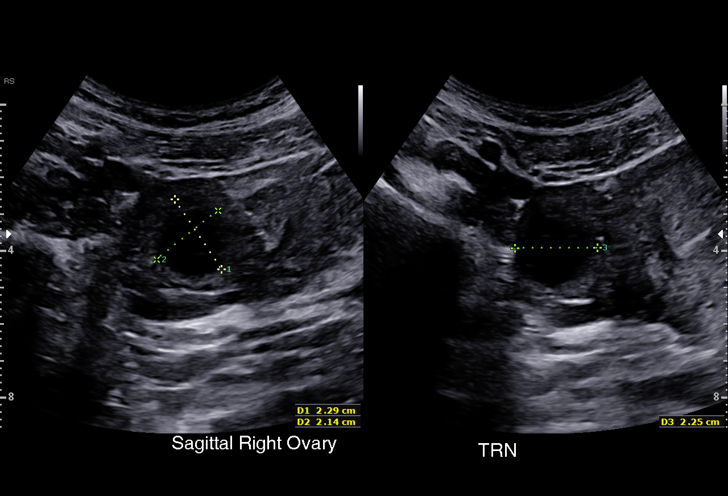
[im 27/72]
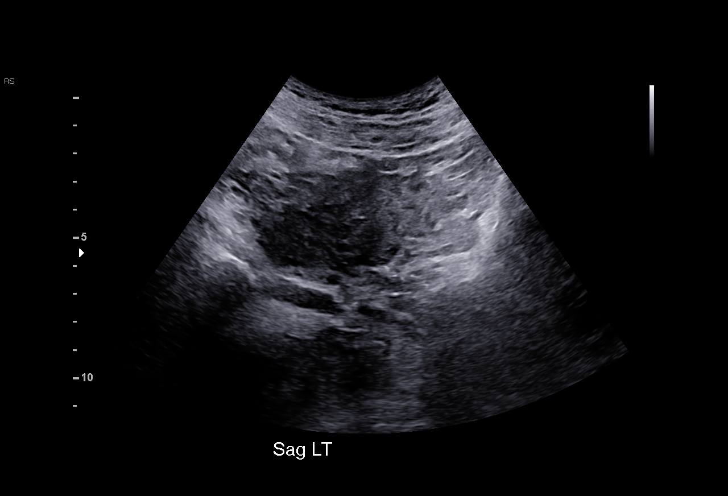
[im 32/72]
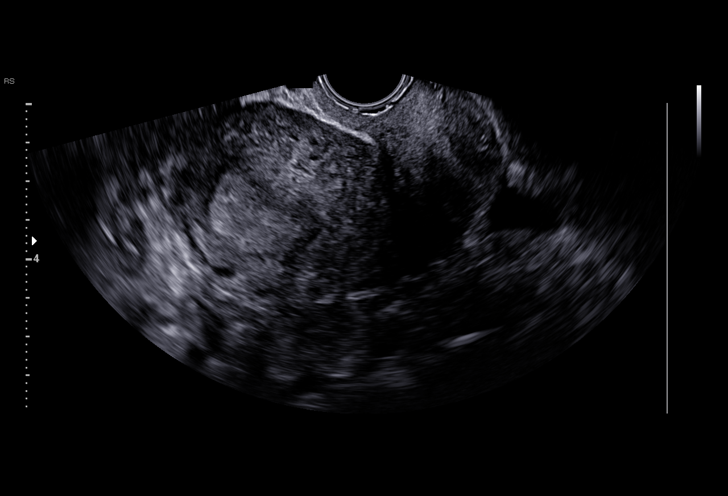
[im 37/72]
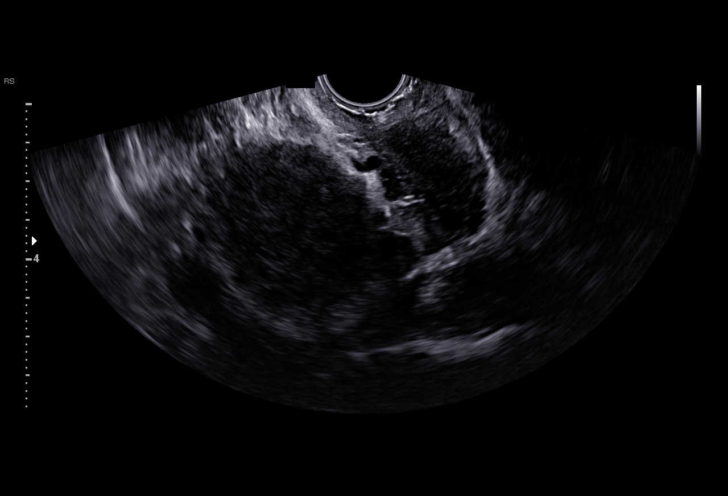
[im 40/72]
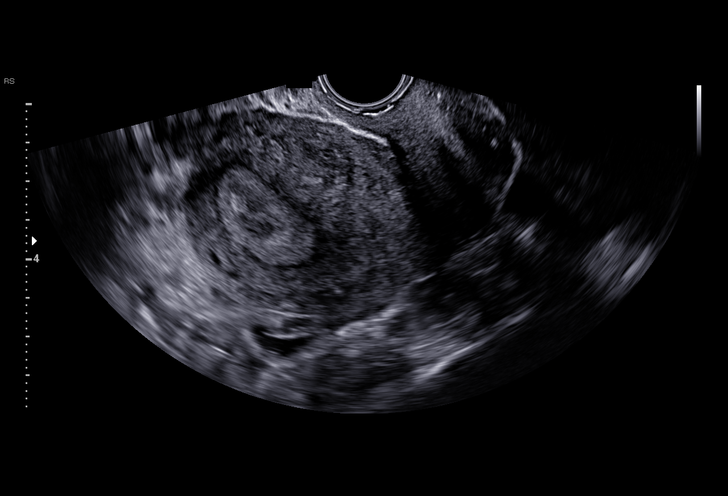
[im 45/72]
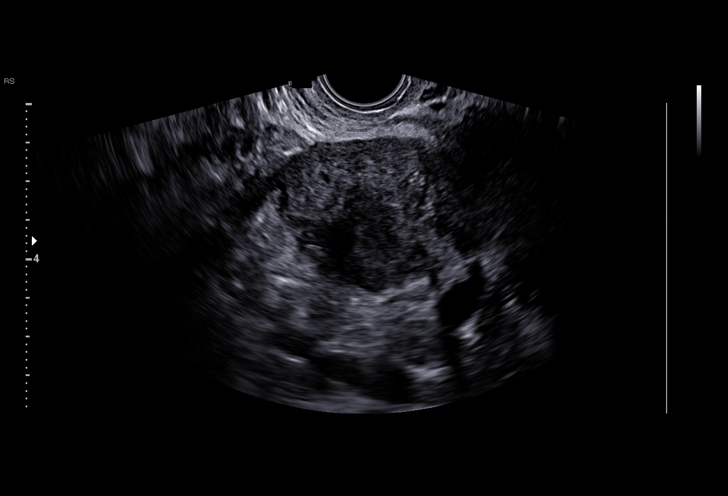
[im 50/72]
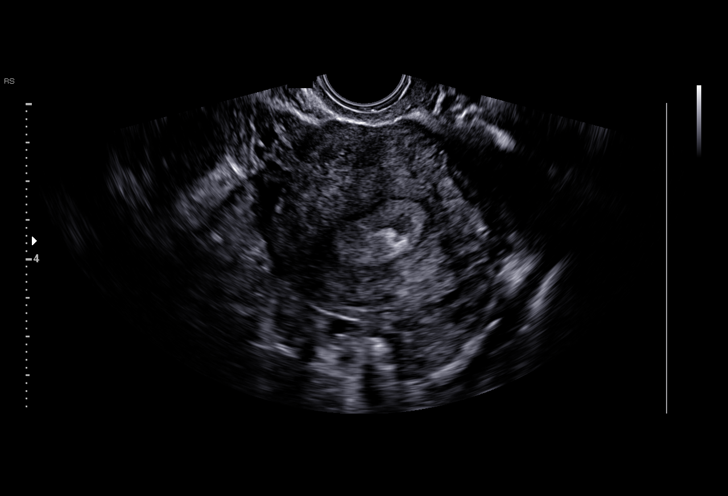
[im 56/72]
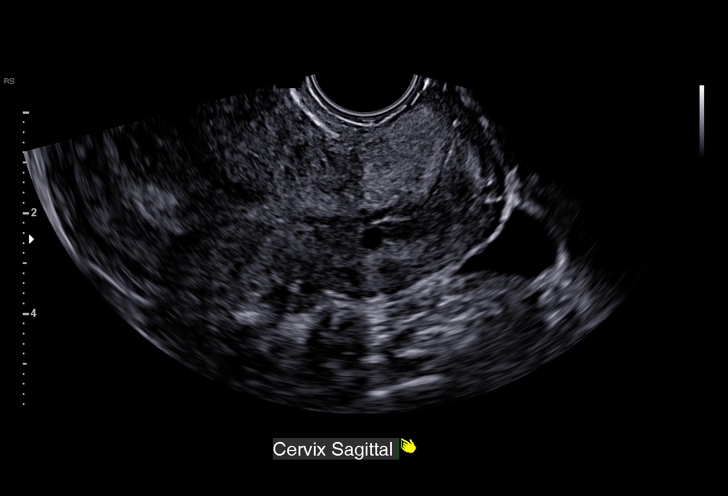
[im 61/72]
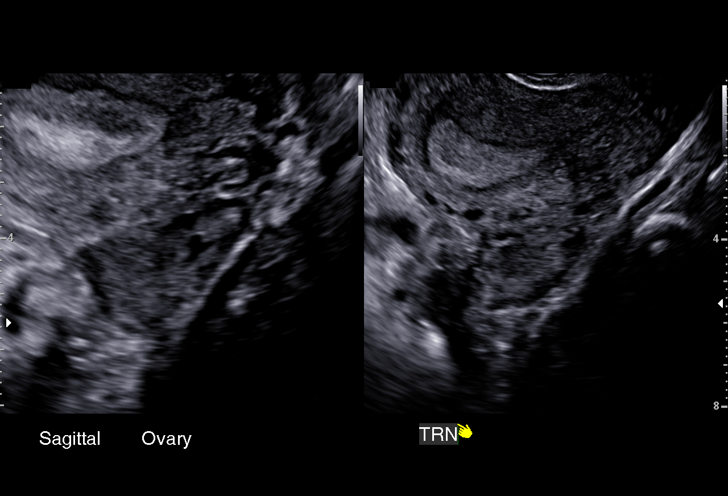
[im 66/72]
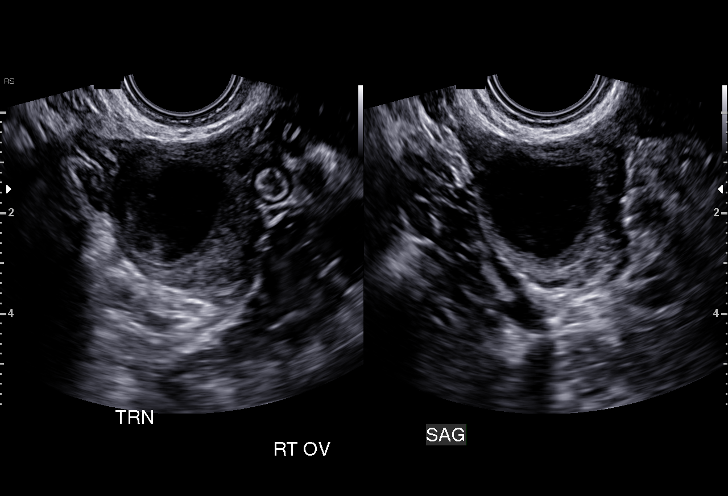
[im 72/72]
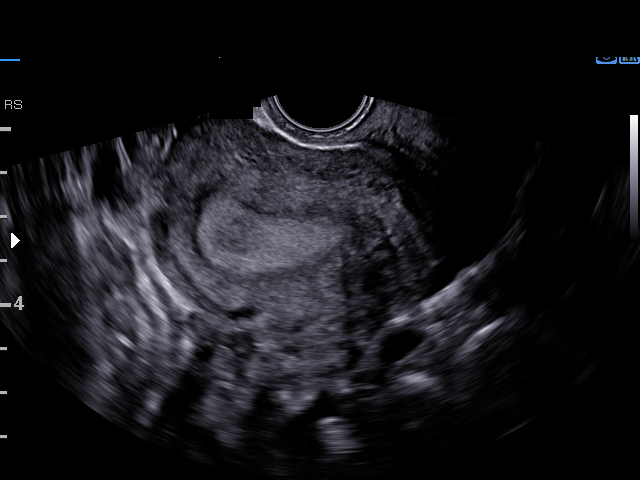

[15 of 28 positions shown; findings below may reference images not displayed]

FINDINGS: Intrauterine gestational sac: None

Yolk sac:  Not Visualized.

Embryo:  Not Visualized.

Cardiac Activity: Not Visualized.

Subchorionic hemorrhage:  None visualized.

Maternal uterus/adnexae:

Right ovary: Normal

Left ovary: Normal

Other :Normal

Free fluid:  Trace
IMPRESSION: 1. No intrauterine gestational sac, yolk sac, or fetal pole
identified. Differential considerations include intrauterine
pregnancy too early to be sonographically visualized, missed
abortion, or ectopic pregnancy. Followup ultrasound is recommended
in 10-14 days for further evaluation.

## 2019-03-19 ENCOUNTER — Encounter (HOSPITAL_COMMUNITY): Payer: Self-pay | Admitting: Emergency Medicine

## 2019-03-19 ENCOUNTER — Emergency Department (HOSPITAL_COMMUNITY)
Admission: EM | Admit: 2019-03-19 | Discharge: 2019-03-19 | Disposition: A | Discharge status: Home / Self Care | Payer: Self-pay | Attending: Emergency Medicine | Admitting: Emergency Medicine

## 2019-03-19 ENCOUNTER — Emergency Department (HOSPITAL_COMMUNITY): Payer: Self-pay

## 2019-03-19 ENCOUNTER — Other Ambulatory Visit: Payer: Self-pay

## 2019-03-19 DIAGNOSIS — R1013 Epigastric pain: Secondary | ICD-10-CM

## 2019-03-19 DIAGNOSIS — N8500 Endometrial hyperplasia, unspecified: Secondary | ICD-10-CM | POA: Insufficient documentation

## 2019-03-19 DIAGNOSIS — Z79899 Other long term (current) drug therapy: Secondary | ICD-10-CM | POA: Insufficient documentation

## 2019-03-19 DIAGNOSIS — R9389 Abnormal findings on diagnostic imaging of other specified body structures: Secondary | ICD-10-CM

## 2019-03-19 DIAGNOSIS — F17210 Nicotine dependence, cigarettes, uncomplicated: Secondary | ICD-10-CM | POA: Insufficient documentation

## 2019-03-19 DIAGNOSIS — N72 Inflammatory disease of cervix uteri: Secondary | ICD-10-CM | POA: Insufficient documentation

## 2019-03-19 DIAGNOSIS — J45909 Unspecified asthma, uncomplicated: Secondary | ICD-10-CM | POA: Insufficient documentation

## 2019-03-19 LAB — CBC WITH DIFFERENTIAL/PLATELET
Abs Immature Granulocytes: 0.02 10*3/uL (ref 0.00–0.07)
Basophils Absolute: 0.1 10*3/uL (ref 0.0–0.1)
Basophils Relative: 1 %
Eosinophils Absolute: 0.1 10*3/uL (ref 0.0–0.5)
Eosinophils Relative: 1 %
HCT: 40.1 % (ref 36.0–46.0)
Hemoglobin: 12.8 g/dL (ref 12.0–15.0)
Immature Granulocytes: 0 %
Lymphocytes Relative: 15 %
Lymphs Abs: 1.5 10*3/uL (ref 0.7–4.0)
MCH: 29.8 pg (ref 26.0–34.0)
MCHC: 31.9 g/dL (ref 30.0–36.0)
MCV: 93.5 fL (ref 80.0–100.0)
Monocytes Absolute: 0.4 10*3/uL (ref 0.1–1.0)
Monocytes Relative: 4 %
Neutro Abs: 8.1 10*3/uL — ABNORMAL HIGH (ref 1.7–7.7)
Neutrophils Relative %: 79 %
Platelets: 296 10*3/uL (ref 150–400)
RBC: 4.29 MIL/uL (ref 3.87–5.11)
RDW: 12 % (ref 11.5–15.5)
WBC: 10.2 10*3/uL (ref 4.0–10.5)
nRBC: 0 % (ref 0.0–0.2)

## 2019-03-19 LAB — URINALYSIS, ROUTINE W REFLEX MICROSCOPIC
Bilirubin Urine: NEGATIVE
Glucose, UA: NEGATIVE mg/dL
Hgb urine dipstick: NEGATIVE
Ketones, ur: NEGATIVE mg/dL
Nitrite: NEGATIVE
Protein, ur: NEGATIVE mg/dL
Specific Gravity, Urine: 1.009 (ref 1.005–1.030)
pH: 8 (ref 5.0–8.0)

## 2019-03-19 LAB — COMPREHENSIVE METABOLIC PANEL
ALT: 11 U/L (ref 0–44)
AST: 19 U/L (ref 15–41)
Albumin: 3.8 g/dL (ref 3.5–5.0)
Alkaline Phosphatase: 82 U/L (ref 38–126)
Anion gap: 6 (ref 5–15)
BUN: 7 mg/dL (ref 6–20)
CO2: 25 mmol/L (ref 22–32)
Calcium: 9 mg/dL (ref 8.9–10.3)
Chloride: 103 mmol/L (ref 98–111)
Creatinine, Ser: 0.82 mg/dL (ref 0.44–1.00)
GFR calc Af Amer: >60 mL/min (ref >60)
GFR calc non Af Amer: >60 mL/min (ref >60)
Glucose, Bld: 108 mg/dL — ABNORMAL HIGH (ref 70–99)
Potassium: 3.9 mmol/L (ref 3.5–5.1)
Sodium: 134 mmol/L — ABNORMAL LOW (ref 135–145)
Total Bilirubin: 0.6 mg/dL (ref 0.3–1.2)
Total Protein: 7 g/dL (ref 6.5–8.1)

## 2019-03-19 LAB — I-STAT BETA HCG BLOOD, ED (MC, WL, AP ONLY): I-stat hCG, quantitative: <5 m[IU]/mL (ref <5)

## 2019-03-19 LAB — WET PREP, GENITAL
Clue Cells Wet Prep HPF POC: NONE SEEN
Sperm: NONE SEEN
Trich, Wet Prep: NONE SEEN
Yeast Wet Prep HPF POC: NONE SEEN

## 2019-03-19 LAB — LIPASE, BLOOD: Lipase: 28 U/L (ref 11–51)

## 2019-03-19 MED ORDER — LIDOCAINE VISCOUS HCL 2 % MT SOLN
15.0000 mL | Freq: Once | OROMUCOSAL | Status: AC
  Administered 2019-03-19: 15 mL via ORAL
  Filled 2019-03-19: qty 15

## 2019-03-19 MED ORDER — ALUM & MAG HYDROXIDE-SIMETH 200-200-20 MG/5ML PO SUSP
30.0000 mL | Freq: Once | ORAL | Status: AC
  Administered 2019-03-19: 30 mL via ORAL
  Filled 2019-03-19: qty 30

## 2019-03-19 MED ORDER — MORPHINE SULFATE (PF) 4 MG/ML IV SOLN
4.0000 mg | Freq: Once | INTRAVENOUS | Status: AC
  Administered 2019-03-19: 4 mg via INTRAVENOUS
  Filled 2019-03-19: qty 1

## 2019-03-19 MED ORDER — SODIUM CHLORIDE 0.9 % IV BOLUS
1000.0000 mL | Freq: Once | INTRAVENOUS | Status: AC
  Administered 2019-03-19: 1000 mL via INTRAVENOUS

## 2019-03-19 MED ORDER — DOXYCYCLINE HYCLATE 100 MG PO TABS
100.0000 mg | ORAL_TABLET | Freq: Once | ORAL | Status: AC
  Administered 2019-03-19: 13:00:00 100 mg via ORAL
  Filled 2019-03-19: qty 1

## 2019-03-19 MED ORDER — HYDROMORPHONE HCL 1 MG/ML IJ SOLN
0.5000 mg | Freq: Once | INTRAMUSCULAR | Status: AC
  Administered 2019-03-19: 0.5 mg via INTRAVENOUS
  Filled 2019-03-19: qty 1

## 2019-03-19 MED ORDER — DOXYCYCLINE HYCLATE 100 MG PO CAPS: 100.0000 mg | ORAL_CAPSULE | Freq: Two times a day (BID) | ORAL | 0 refills | Status: AC

## 2019-03-19 MED ORDER — LIDOCAINE HCL (PF) 1 % IJ SOLN
INTRAMUSCULAR | Status: AC
  Administered 2019-03-19: 1 mL
  Filled 2019-03-19: qty 5

## 2019-03-19 MED ORDER — ONDANSETRON HCL 4 MG/2ML IJ SOLN
4.0000 mg | Freq: Once | INTRAMUSCULAR | Status: AC
  Administered 2019-03-19: 4 mg via INTRAVENOUS
  Filled 2019-03-19: qty 2

## 2019-03-19 MED ORDER — CEFTRIAXONE SODIUM 250 MG IJ SOLR
250.0000 mg | Freq: Once | INTRAMUSCULAR | Status: AC
  Administered 2019-03-19: 250 mg via INTRAMUSCULAR
  Filled 2019-03-19: qty 250

## 2019-03-19 MED ORDER — FAMOTIDINE 20 MG PO TABS: 20.0000 mg | ORAL_TABLET | Freq: Two times a day (BID) | ORAL | 0 refills | Status: DC

## 2019-03-19 NOTE — ED Triage Notes (Signed)
Pt BIB [facility] for generalized ABD pains. EMS reports these pains started at 0400 this date and woke her up. EMS reports the pt is not pregnant, denies N/V/D, and is vitally stable. EMS also reports no fever and cough.  Pt reports the pain woke her up 5 hours ago. Pt denies pregnancy and reports bowel movement 1 hr prior to calling EMS.

## 2019-03-19 NOTE — ED Notes (Signed)
Pelvic cart set up at bedside  

## 2019-03-19 NOTE — ED Provider Notes (Signed)
[facility] EMERGENCY DEPARTMENT Provider Note   CSN: [id number] Arrival date & time: 03/19/19  9678  History   Chief Complaint Chief Complaint  Patient presents with   Abdominal Pain    HPI Lauren Conway is a 28 y.o. female with past medical history significant for anxiety, PID, diarrhea, chlamydia, HSV who presents for evaluation abdominal pain.  Patient states pain located to epigastric, suprapubic as well as right lower quadrant.  She rates her pain a 10/10.  Pain does not radiate.  States pain is been constant since onset.  Pain began approximately 5 hours PTA.  She has not taken anything for her pain.  States she has had white vaginal discharge which is malodorous.  States she is sexually active and does use condoms intermittently for protection.  States she did have a tubal ligation in 2018.  Patient states she is also had some burning with urination.  She has felt nauseous, however has not had any episodes of emesis.  She denies fever, chills, chest pain, shortness of breath, body aches and pains, diarrhea, constipation.  Denies abnormal vaginal bleeding.  Patient's LMP 02/23/2019.Denies NSAID use, alcohol, tobacco or hx of hpylori.  Abdominal surgeries, laparoscopic tubal ligation.  Last p.o. intake 7 PM yesterday.    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   PID (pelvic inflammatory disease)     Patient Active Problem List   Diagnosis Date Noted   LGSIL on Pap smear of cervix 06/12/2018   Status post tubal ligation 10/29/2017   Asthma, chronic, mild intermittent, uncomplicated 08/29/2017   Eczema 08/29/2017   Anxiety 06/27/2017   Maternal varicella, non-immune 05/06/2017   HSV (herpes  simplex virus) anogenital infection 05/01/2017    Past Surgical History:  Procedure Laterality Date   TUBAL LIGATION N/A 10/29/2017   Procedure: POST PARTUM TUBAL LIGATION;  Surgeon: Conan Bowens, MD;  Location: [facility] BIRTHING SUITES;  Service: Gynecology;  Laterality: N/A;     OB History    Gravida  6   Para  4   Term  3   Preterm  1   AB  2   Living  3     SAB  2   TAB  0   Ectopic  0   Multiple  0   Live Births  3            Home Medications    Prior to Admission medications   Medication Sig Start Date End Date Taking? Authorizing Provider  acetaminophen (TYLENOL) 500 MG tablet Take 1,000 mg by mouth every 6 (six) hours as needed for moderate pain.     [provider]  albuterol (PROVENTIL HFA;VENTOLIN HFA) 108 (90 Base) MCG/ACT inhaler Inhale 1-2 puffs into the lungs every 6 (six) hours as needed for wheezing or shortness of breath. 03/17/18   Hedges, Tinnie Gens, PA-C  doxycycline (VIBRAMYCIN) 100 MG capsule Take 1 capsule (100 mg total) by mouth 2 (two) times daily for 10 days. 03/19/19 03/29/19  April Colter A, PA-C  famotidine (PEPCID) 20 MG tablet Take 1 tablet (20 mg total) by mouth 2 (two) times daily. 03/19/19  Eion Timbrook A, PA-C  ibuprofen (ADVIL,MOTRIN) 800 MG tablet Take 1 tablet (800 mg total) by mouth 3 (three) times daily. 02/28/18   Rolm Bookbinder, CNM  loratadine (CLARITIN) 10 MG tablet Take 1 tablet (10 mg total) by mouth daily. 03/17/18   Eyvonne Mechanic, PA-C    Family History Family History  Problem Relation Age of Onset   Hypertension Mother    Depression Mother    Breast cancer Paternal Grandmother    Diabetes Paternal Grandmother    Asthma Maternal Grandmother    Other Neg Hx     Social History Social History   Tobacco Use   Smoking status: Current Every Day Smoker    Packs/day: 0.00    Years: 0.50    Pack years: 0.00    Types: Cigarettes    Last attempt to quit: 03/16/2017    Years since quitting: 2.0     Smokeless tobacco: Never Used  Substance Use Topics   Alcohol use: Yes    Alcohol/week: 0.0 standard drinks    Comment: occassionally   Drug use: Yes    Types: Marijuana    Comment: no more     Allergies   Eggs or egg-derived products and Latex   Review of Systems Review of Systems  Constitutional: Negative.   HENT: Negative.   Respiratory: Negative.   Cardiovascular: Negative.   Gastrointestinal: Positive for abdominal pain and nausea. Negative for anal bleeding, blood in stool, constipation, diarrhea, rectal pain and vomiting.  Genitourinary: Positive for dysuria, pelvic pain and vaginal discharge. Negative for difficulty urinating, enuresis, flank pain, frequency, genital sores, hematuria, menstrual problem, vaginal bleeding and vaginal pain.  Musculoskeletal: Negative.   Skin: Negative.   Neurological: Negative.   All other systems reviewed and are negative.  Physical Exam Updated Vital Signs BP 114/65 (BP Location: Right Arm)    Pulse 63    Temp 98.3 F (36.8 C) (Oral)    Resp 16    Ht  (1.651 m)    Wt 90.7 kg    LMP 02/23/2019 (Exact Date)    SpO2 98%    BMI 33.28 kg/m   Physical Exam Vitals signs and nursing note reviewed.  Constitutional:      General: She is not in acute distress.    Appearance: She is well-developed. She is not ill-appearing, toxic-appearing or diaphoretic.  HENT:     Head: Atraumatic.     Mouth/Throat:     Comments: Posterior oropharynx clear.  Mucous membranes moist. Eyes:     Pupils: Pupils are equal, round, and reactive to light.  Neck:     Musculoskeletal: Normal range of motion.  Cardiovascular:     Rate and Rhythm: Normal rate.     Heart sounds: Normal heart sounds.  Pulmonary:     Effort: No respiratory distress.     Comments: Clear to auscultation bilaterally without wheeze, rhonchi or rales.  No accessory muscle usage.  Able speak in full sentences without difficulty. Abdominal:     General: There is no distension.      Comments: Soft without rebound or guarding.  She has generalized tenderness.  Negative psoas, obturator and Rovsing sign.  No CVA tenderness.  No evidence of abdominal wall herniations.  Genitourinary:    Comments: Normal appearing external female genitalia without rashes or lesions, normal vaginal epithelium. Normal appearing cervix moderate thick white and yellow discharge. No cervical petechiae. Cervical os is closed. There is no bleeding noted at the os. Mild Odor.  Bimanual: Mild CMT, nontender. No palpable adnexal masses or tenderness. Uterus midline and not fixed. Rectovaginal exam was deferred.  No cystocele or rectocele noted. No pelvic lymphadenopathy noted. Wet prep was obtained.  Cultures for gonorrhea and chlamydia collected. Exam performed with chaperone in room. Musculoskeletal: Normal range of motion.  Skin:    General: Skin is warm and dry.     Comments: Brisk capillary refill.  No jaundice.  No rashes or lesions.  Neurological:     Mental Status: She is alert.     Comments: Ambulatory in department that difficulty.  No facial droop.  Cranial nerves II through XII grossly intact.    ED Treatments / Results  Labs (all labs ordered are listed, but only abnormal results are displayed) Labs Reviewed  WET PREP, GENITAL - Abnormal; Notable for the following components:      Result Value   WBC, Wet Prep HPF POC MODERATE (*)    All other components within normal limits  CBC WITH DIFFERENTIAL/PLATELET - Abnormal; Notable for the following components:   Neutro Abs 8.1 (*)    All other components within normal limits  COMPREHENSIVE METABOLIC PANEL - Abnormal; Notable for the following components:   Sodium 134 (*)    Glucose, Bld 108 (*)    All other components within normal limits  URINALYSIS, ROUTINE W REFLEX MICROSCOPIC - Abnormal; Notable for the following components:   Color, Urine STRAW (*)    Leukocytes,Ua TRACE (*)    Bacteria, UA RARE (*)    All other components  within normal limits  URINE CULTURE  LIPASE, BLOOD  HIV ANTIBODY (ROUTINE TESTING W REFLEX)  RPR  I-STAT BETA HCG BLOOD, ED ([facility], [facility], [facility] ONLY)  WET PREP  (BD AFFIRM) ([place])  GC/CHLAMYDIA PROBE AMP ([place]) NOT AT [facility]    EKG None  Radiology US Transvaginal Non-ob  Result Date: 03/19/2019 CLINICAL DATA:  Right lower quadrant pain EXAM: TRANSABDOMINAL AND TRANSVAGINAL ULTRASOUND OF PELVIS DOPPLER ULTRASOUND OF OVARIES TECHNIQUE: Both transabdominal and transvaginal ultrasound examinations of the pelvis were performed. Transabdominal technique was performed for global imaging of the pelvis including uterus, ovaries, adnexal regions, and pelvic cul-de-sac. It was necessary to proceed with endovaginal exam following the transabdominal exam to visualize the uterus, endometrium, ovaries and adnexa. Color and duplex Doppler ultrasound was utilized to evaluate blood flow to the ovaries. COMPARISON:  None. FINDINGS: Uterus Measurements: 9.2 x 5.2 x 6.3 cm = volume: 158 mL. No fibroids or other mass visualized. Endometrium Thickness: 18 mm.  No focal abnormality visualized. Right ovary Measurements: 3.6 x 2.0 x 2.8 cm = volume: 10.5 mL. 2.2 cm complex dominant follicle. No adnexal mass. Left ovary Measurements: 3.1 x 1.4 x 2.1 cm = volume: 4.9 mL. Normal appearance/no adnexal mass. Pulsed Doppler evaluation of both ovaries demonstrates normal low-resistance arterial and venous waveforms. Other findings No abnormal free fluid. IMPRESSION: Endometrial thickness 18 mm, which is considered abnormal. Consider follow-up by [place] in 6-8 weeks, during the week immediately following menses (exam timing is critical). Electronically Signed   By: Charlett Nose M.D.   On: 03/19/2019 11:47   US Pelvis Complete  Result Date: 03/19/2019 CLINICAL DATA:  Right lower quadrant pain EXAM: TRANSABDOMINAL AND TRANSVAGINAL ULTRASOUND OF PELVIS DOPPLER ULTRASOUND OF OVARIES TECHNIQUE: Both transabdominal and transvaginal  ultrasound examinations of the pelvis were performed. Transabdominal technique was performed for global imaging of the pelvis including uterus, ovaries, adnexal regions, and pelvic cul-de-sac. It was necessary to proceed with endovaginal  exam following the transabdominal exam to visualize the uterus, endometrium, ovaries and adnexa. Color and duplex Doppler ultrasound was utilized to evaluate blood flow to the ovaries. COMPARISON:  None. FINDINGS: Uterus Measurements: 9.2 x 5.2 x 6.3 cm = volume: 158 mL. No fibroids or other mass visualized. Endometrium Thickness: 18 mm.  No focal abnormality visualized. Right ovary Measurements: 3.6 x 2.0 x 2.8 cm = volume: 10.5 mL. 2.2 cm complex dominant follicle. No adnexal mass. Left ovary Measurements: 3.1 x 1.4 x 2.1 cm = volume: 4.9 mL. Normal appearance/no adnexal mass. Pulsed Doppler evaluation of both ovaries demonstrates normal low-resistance arterial and venous waveforms. Other findings No abnormal free fluid. IMPRESSION: Endometrial thickness 18 mm, which is considered abnormal. Consider follow-up by US in 6-8 weeks, during the week immediately following menses (exam timing is critical). Electronically Signed   By: Charlett NoseKevin  Dover M.D.   On: 03/19/2019 11:47   [place] Art/ven Flow Abd Pelv Doppler  Result Date: 03/19/2019 CLINICAL DATA:  Right lower quadrant pain EXAM: TRANSABDOMINAL AND TRANSVAGINAL ULTRASOUND OF PELVIS DOPPLER ULTRASOUND OF OVARIES TECHNIQUE: Both transabdominal and transvaginal ultrasound examinations of the pelvis were performed. Transabdominal technique was performed for global imaging of the pelvis including uterus, ovaries, adnexal regions, and pelvic cul-de-sac. It was necessary to proceed with endovaginal exam following the transabdominal exam to visualize the uterus, endometrium, ovaries and adnexa. Color and duplex Doppler ultrasound was utilized to evaluate blood flow to the ovaries. COMPARISON:  None. FINDINGS: Uterus Measurements: 9.2 x 5.2 x  6.3 cm = volume: 158 mL. No fibroids or other mass visualized. Endometrium Thickness: 18 mm.  No focal abnormality visualized. Right ovary Measurements: 3.6 x 2.0 x 2.8 cm = volume: 10.5 mL. 2.2 cm complex dominant follicle. No adnexal mass. Left ovary Measurements: 3.1 x 1.4 x 2.1 cm = volume: 4.9 mL. Normal appearance/no adnexal mass. Pulsed Doppler evaluation of both ovaries demonstrates normal low-resistance arterial and venous waveforms. Other findings No abnormal free fluid. IMPRESSION: Endometrial thickness 18 mm, which is considered abnormal. Consider follow-up by US in 6-8 weeks, during the week immediately following menses (exam timing is critical). Electronically Signed   By: Charlett NoseKevin  Dover M.D.   On: 03/19/2019 11:47    Procedures Procedures (including critical care time)  Medications Ordered in ED Medications  sodium chloride 0.9 % bolus 1,000 mL (1,000 mLs Intravenous New Bag/Given 03/19/19 1024)  ondansetron (ZOFRAN) injection 4 mg (4 mg Intravenous Given 03/19/19 1024)  morphine 4 MG/ML injection 4 mg (4 mg Intravenous Given 03/19/19 1024)  alum & mag hydroxide-simeth (MAALOX/MYLANTA) 200-200-20 MG/5ML suspension 30 mL (30 mLs Oral Given 03/19/19 1159)    And  lidocaine (XYLOCAINE) 2 % viscous mouth solution 15 mL (15 mLs Oral Given 03/19/19 1158)  HYDROmorphone (DILAUDID) injection 0.5 mg (0.5 mg Intravenous Given 03/19/19 1225)  cefTRIAXone (ROCEPHIN) injection 250 mg (250 mg Intramuscular Given 03/19/19 1253)  doxycycline (VIBRA-TABS) tablet 100 mg (100 mg Oral Given 03/19/19 1254)  lidocaine (PF) (XYLOCAINE) 1 % injection (1 mL  Given 03/19/19 1254)   Initial Impression / Assessment and Plan / ED Course  I have reviewed the triage vital signs and the nursing notes.  Pertinent labs & imaging results that were available during my care of the patient were reviewed by me and considered in my medical decision making (see chart for details).  28 year old female appears otherwise well presents for  evaluation of abdominal pain.  Afebrile, nonseptic, non-ill-appearing.  Abdomen generally tender to palpation.  There is no rebound  or guarding.  Negative psoas, obturator, Rovsing sign.  Negative McBurney point.  He has had some dysuria as well as white malodorous vaginal discharge. Mild tenderness to suprapubic and epigastric region. Has had some reflux sx this morning without hx of reflux. Denies NSAID use, alcohol, tobacco or hx of hpylori. She does have history of STDs.  LMP 02/23/2019.  Mucous membranes moist.  Will obtain labs, urine and reevaluate.  1100: Patient abdomen soft, generally tender to palpation.  No rebound or guarding.  Nonsurgical abdomen.  Labs and imaging pending.   1200: CBC without leukocytosis, metabolic panel without significant electrolyte, renal or liver abnormality, urinalysis with trace leuks, rare bacteria, lipase 28, wet prep with moderate WBC, hCG negative.  GU exam with mild cervical motion tenderness with moderate white and yellow thick vaginal discharge. No adnexal tenderness. [place] pending. Epigastric pain resolved with GI cocktail. Mild persistent suprapubic and RLQ pain. No CVA tenderness to suggest pyelonephritis. Urine without blood.  1240: On reevaluation abdomen soft without rebound or guarding.  Non surgical abdomen. She still has mild suprapubic tenderness as well as some left and right lower quadrant tenderness.  Ultrasound shows thickened endometrium without any evidence of torsion or TOA.  I have personally reviewed images. Discussed FU with Obgyn for endometrial thickening. Decision making conversation with patient given probable cervicitis on exam.  She has no psoas, obturator Rovsing sign to right lower quadrant, however without CT scan cannot rule out appendicitis.  I discussed this with patient.  Discussed risk versus benefit of CT imaging to r/o appendicitis, bowel obstruction/perforation, enteritis, gallbladder pathology.  Patient discussed understanding of  risk versus benefit of additional imaging and has deferred CT imaging at this time.  Requesting DC home at this time.  I had a thorough discussion of strict return precautions with patient.  Will treat for cervicitis given cervical motion tenderness on exam with moderate WBC.  She did have trace leuks and rare bacteria in her urine, however her antibiotics given for cervicitis will also cover this as well.  Will also culture her urine.  She also has epigastric pain which was relieved with GI cocktail.  Probable some degree of reflux.  Will start patient on Pepcid.   Patient is nontoxic, nonseptic appearing, in no apparent distress.  Patient's pain and other symptoms adequately managed in emergency department.  Fluid bolus given.  Labs, imaging and vitals reviewed.  Patient does not meet the SIRS or Sepsis criteria.  On repeat exam patient does not have a surgical abdomin and there are no peritoneal signs.  No indication of appendicitis, bowel obstruction, bowel perforation, cholecystitis, diverticulitis, or ectopic pregnancy.  Patient discharged home with symptomatic treatment and given strict instructions for follow-up with their primary care physician.  I have also discussed reasons to return immediately to the ER.  Patient expresses understanding and agrees with plan.     Final Clinical Impressions(s) / ED Diagnoses   Final diagnoses:  Cervicitis  Endometrial thickening on ultrasound  Epigastric pain    ED Discharge Orders         Ordered    doxycycline (VIBRAMYCIN) 100 MG capsule  2 times daily     03/19/19 1243    famotidine (PEPCID) 20 MG tablet  2 times daily     03/19/19 1243           Glessie Eustice A, PA-C 03/19/19 1305    Arby Barrette, MD 03/20/19 1111

## 2019-03-19 NOTE — Discharge Instructions (Signed)
You did have evidence of white blood cells on your pelvic exam today and ultrasound did show a thickened endometrium.  They do recommend a follow-up ultrasound for this.  Please follow-up with OB/GYN.  We had a shared decision-making conversation, return to any CT scan or not.  Treatment to defer this at this time.  If you develop worsening right lower quadrant pain return to the emergency department for evaluation.  Have given you medication for cervicitis.  Please take as prescribed.  You will be notified.,  Chlamydia, HIV and syphilis testing at these are positive.

## 2019-03-20 LAB — URINE CULTURE: Culture: 10000 — AB

## 2019-03-20 LAB — HIV ANTIBODY (ROUTINE TESTING W REFLEX): HIV Screen 4th Generation wRfx: NONREACTIVE

## 2019-03-20 LAB — RPR: RPR Ser Ql: NONREACTIVE

## 2019-03-21 ENCOUNTER — Telehealth: Payer: Self-pay | Admitting: Emergency Medicine

## 2019-03-21 NOTE — Telephone Encounter (Signed)
Post ED Visit - Positive Culture Follow-up  Culture report reviewed by antimicrobial stewardship pharmacist: [facility] []  Enzo Bi, Pharm.D. []  Celedonio Miyamoto, Pharm.D., BCPS AQ-ID []  Garvin Fila, Pharm.D., BCPS []  Georgina Pillion, Pharm.D., BCPS [x]  [place], [place].D., BCPS, AAHIVP []  Estella Husk, Pharm.D., BCPS, AAHIVP []  Lysle Pearl, PharmD, BCPS []  Phillips Climes, PharmD, BCPS []  Agapito Games, PharmD, BCPS []  Verlan Friends, PharmD []  Mervyn Gay, PharmD, BCPS []  Vinnie Level, PharmD  [facility] []  Len Childs, PharmD []  Greer Pickerel, PharmD []  Adalberto Cole, PharmD []  Perlie Gold, Rph []  Lonell Face) Jean Rosenthal, PharmD []  Earl Many, PharmD []  Junita Push, PharmD []  Dorna Leitz, PharmD []  Terrilee Files, PharmD []  Lynann Beaver, PharmD []  Keturah Barre, PharmD []  Loralee Pacas, PharmD []  Bernadene Person, PharmD   Positive urine culture Treated with Doxycycline, organism sensitive to the same and no further patient follow-up is required at this time.  Carollee Herter Fryda Molenda 03/21/2019, 12:54 PM

## 2019-03-22 LAB — GC/CHLAMYDIA PROBE AMP (~~LOC~~) NOT AT ARMC
Chlamydia: NEGATIVE
Neisseria Gonorrhea: NEGATIVE

## 2019-06-15 ENCOUNTER — Ambulatory Visit (HOSPITAL_COMMUNITY): Payer: Self-pay

## 2019-07-01 ENCOUNTER — Ambulatory Visit (HOSPITAL_COMMUNITY)
Admission: RE | Admit: 2019-07-01 | Discharge: 2019-07-01 | Disposition: A | Discharge status: Home / Self Care | Payer: Self-pay | Source: Ambulatory Visit | Attending: Obstetrics and Gynecology | Admitting: Obstetrics and Gynecology

## 2019-07-01 ENCOUNTER — Other Ambulatory Visit: Payer: Self-pay

## 2019-07-01 ENCOUNTER — Encounter (HOSPITAL_COMMUNITY): Payer: Self-pay

## 2019-07-01 DIAGNOSIS — Z01419 Encounter for gynecological examination (general) (routine) without abnormal findings: Secondary | ICD-10-CM

## 2019-07-01 NOTE — Addendum Note (Signed)
Encounter addended by: Loletta Parish, RN on: 07/01/2019 12:08 PM  Actions taken: Clinical Note Signed

## 2019-07-01 NOTE — Progress Notes (Addendum)
No complaints today.   Pap Smear: Pap smear completed today. Last Pap smear was 01/09/2018 at [facility] and LGSIL. Patient had a colposcopy for follow-up on 06/12/2018 that showed koilocytic atypia. Per patient her most recent Pap smear is the only abnormal Pap smear she has had. Last three Pap smear and colposcopy results are in Epic.  Physical exam: Breasts Breasts symmetrical. No skin abnormalities bilateral breasts. No nipple retraction bilateral breasts. No nipple discharge bilateral breasts. No lymphadenopathy. No lumps palpated bilateral breasts. No complaints of pain or tenderness on exam. Screening mammogram recommended at age 74 unless clinically indicated prior.    Pelvic/Bimanual   Ext Genitalia No lesions, no swelling and no discharge observed on external genitalia.         Vagina Vagina pink and normal texture. No lesions or discharge observed in vagina.          Cervix Cervix is present. Cervix pink and of normal texture. No discharge observed.     Uterus Uterus is present and palpable. Uterus in normal position and normal size.        Adnexae Bilateral ovaries present and palpable. No tenderness on palpation.         Rectovaginal No rectal exam completed today since patient had no rectal complaints. No skin abnormalities observed on exam.    Smoking History: Patient is a current smoker. Discussed smoking cessation with patient. Referred patient to the [facility] Quitline and gave resources to the free smoking cessation classes at [facility].  Patient Navigation: Patient education provided. Access to services provided for patient through [facility] program.   Breast and Cervical Cancer Risk Assessment: Patient has a family history of her paternal grandmother having breast cancer. Patient has no known genetic mutations or history of radiation treatment to the chest before age 28. Patient has a history of cervical dysplasia. Patient has no history of being  immunocompromised or DES exposure in-utero. Breast cancer risk assessment completed. No breast cancer risk calculated due to patient is less than 69 years old.

## 2019-07-01 NOTE — Patient Instructions (Addendum)
Explained breast self awareness with Earle Gell. Let patient know that if today's Pap smear is normal that her next Pap smear will be due in one year.  Let patient know will follow up with her within the next couple weeks with results of Pap smear by letter or phone. Told patient a screening mammogram is recommended at age 28 unless clinically indicated prior. Discussed smoking cessation with patient. Referred patient to the [facility] Quitline and gave resources to the free smoking cessation classes at [facility]. Lauren Conway verbalized understanding.  Celes Dedic, Arvil Chaco, RN 11:02 AM

## 2019-07-06 LAB — CYTOLOGY - PAP
Diagnosis: NEGATIVE
HPV: NOT DETECTED

## 2019-07-08 ENCOUNTER — Encounter (HOSPITAL_COMMUNITY): Payer: Self-pay | Admitting: *Deleted

## 2019-07-08 NOTE — Progress Notes (Signed)
Letter mailed to patient with negative pap smear results.  

## 2019-11-15 ENCOUNTER — Ambulatory Visit (INDEPENDENT_AMBULATORY_CARE_PROVIDER_SITE_OTHER): Payer: PRIVATE HEALTH INSURANCE

## 2019-11-15 ENCOUNTER — Other Ambulatory Visit: Payer: Self-pay

## 2019-11-15 VITALS — BP 113/70 | HR 102 | Wt 186.0 lb

## 2019-11-15 DIAGNOSIS — N898 Other specified noninflammatory disorders of vagina: Secondary | ICD-10-CM | POA: Diagnosis not present

## 2019-11-15 DIAGNOSIS — B9689 Other specified bacterial agents as the cause of diseases classified elsewhere: Secondary | ICD-10-CM | POA: Diagnosis not present

## 2019-11-15 DIAGNOSIS — Z113 Encounter for screening for infections with a predominantly sexual mode of transmission: Secondary | ICD-10-CM | POA: Diagnosis not present

## 2019-11-15 DIAGNOSIS — N760 Acute vaginitis: Secondary | ICD-10-CM

## 2019-11-15 NOTE — Progress Notes (Addendum)
SUBJECTIVE:  28 y.o. female complains of possible exposure.  Denies discharge, odor, abnormal vaginal bleeding or significant pelvic pain or fever. No UTI symptoms.  Thinks she might have had an exposure to STD from her partner.  No LMP recorded.  OBJECTIVE:  She appears well, afebrile. Urine dipstick: not done.  ASSESSMENT:  Possible Exposure   PLAN:  GC, chlamydia, trichomonas, BVAG, CVAG probe sent to lab. Treatment: To be determined once lab results are received ROV prn if symptoms persist or worsen.  Shelly Bombard, MD 11/15/2019

## 2019-11-16 LAB — CERVICOVAGINAL ANCILLARY ONLY
Bacterial Vaginitis (gardnerella): POSITIVE — AB
Candida Glabrata: NEGATIVE
Candida Vaginitis: NEGATIVE
Chlamydia: NEGATIVE
Comment: NEGATIVE
Comment: NEGATIVE
Comment: NEGATIVE
Comment: NEGATIVE
Comment: NEGATIVE
Comment: NORMAL
Neisseria Gonorrhea: NEGATIVE
Trichomonas: NEGATIVE

## 2019-11-17 ENCOUNTER — Other Ambulatory Visit: Payer: Self-pay | Admitting: Obstetrics

## 2019-11-17 DIAGNOSIS — B9689 Other specified bacterial agents as the cause of diseases classified elsewhere: Secondary | ICD-10-CM

## 2019-11-17 DIAGNOSIS — N760 Acute vaginitis: Secondary | ICD-10-CM

## 2019-11-17 MED ORDER — METRONIDAZOLE 500 MG PO TABS: 500.0000 mg | ORAL_TABLET | Freq: Two times a day (BID) | ORAL | 2 refills | Status: DC

## 2019-11-24 ENCOUNTER — Ambulatory Visit: Payer: Self-pay | Admitting: Obstetrics and Gynecology

## 2019-12-06 DIAGNOSIS — N898 Other specified noninflammatory disorders of vagina: Secondary | ICD-10-CM

## 2019-12-07 ENCOUNTER — Telehealth: Payer: Self-pay

## 2019-12-07 MED ORDER — METRONIDAZOLE 0.75 % VA GEL: 1.0000 | Freq: Every day | VAGINAL | 1 refills | Status: DC

## 2019-12-07 NOTE — Telephone Encounter (Signed)
Patient requested BV Gel instead of pills Rx sent per protocol Pt made aware no change in x 14 days to seek medical attention/appt /Self swab.

## 2019-12-13 ENCOUNTER — Ambulatory Visit (INDEPENDENT_AMBULATORY_CARE_PROVIDER_SITE_OTHER): Payer: PRIVATE HEALTH INSURANCE

## 2019-12-13 ENCOUNTER — Encounter: Payer: Self-pay | Admitting: Obstetrics & Gynecology

## 2019-12-13 ENCOUNTER — Other Ambulatory Visit: Payer: Self-pay

## 2019-12-13 VITALS — BP 115/73 | HR 96 | Ht 64.0 in | Wt 184.7 lb

## 2019-12-13 DIAGNOSIS — Z113 Encounter for screening for infections with a predominantly sexual mode of transmission: Secondary | ICD-10-CM | POA: Diagnosis not present

## 2019-12-13 DIAGNOSIS — N898 Other specified noninflammatory disorders of vagina: Secondary | ICD-10-CM | POA: Diagnosis not present

## 2019-12-13 DIAGNOSIS — B373 Candidiasis of vulva and vagina: Secondary | ICD-10-CM

## 2019-12-13 NOTE — Progress Notes (Signed)
Pt is in the office for self swab, requests std testing, reports vaginal discharge.

## 2019-12-14 LAB — CERVICOVAGINAL ANCILLARY ONLY
Bacterial Vaginitis (gardnerella): NEGATIVE
Candida Glabrata: NEGATIVE
Candida Vaginitis: POSITIVE — AB
Chlamydia: NEGATIVE
Comment: NEGATIVE
Comment: NEGATIVE
Comment: NEGATIVE
Comment: NEGATIVE
Comment: NEGATIVE
Comment: NORMAL
Neisseria Gonorrhea: NEGATIVE
Trichomonas: NEGATIVE

## 2019-12-14 LAB — RPR: RPR Ser Ql: NONREACTIVE

## 2019-12-14 LAB — HEPATITIS C ANTIBODY: Hep C Virus Ab: <0.1 s/co ratio (ref 0.0–0.9)

## 2019-12-14 LAB — HIV ANTIBODY (ROUTINE TESTING W REFLEX): HIV Screen 4th Generation wRfx: NONREACTIVE

## 2019-12-14 LAB — HEPATITIS B SURFACE ANTIGEN: Hepatitis B Surface Ag: NEGATIVE

## 2019-12-15 ENCOUNTER — Other Ambulatory Visit: Payer: Self-pay | Admitting: Obstetrics

## 2019-12-18 ENCOUNTER — Other Ambulatory Visit: Payer: Self-pay | Admitting: Obstetrics & Gynecology

## 2019-12-18 DIAGNOSIS — N898 Other specified noninflammatory disorders of vagina: Secondary | ICD-10-CM

## 2019-12-18 MED ORDER — FLUCONAZOLE 150 MG PO TABS: 150.0000 mg | ORAL_TABLET | Freq: Once | ORAL | 0 refills | Status: AC

## 2020-01-04 ENCOUNTER — Emergency Department (HOSPITAL_COMMUNITY)
Admission: EM | Admit: 2020-01-04 | Discharge: 2020-01-05 | Disposition: A | Discharge status: Home / Self Care | Payer: PRIVATE HEALTH INSURANCE | Attending: Emergency Medicine | Admitting: Emergency Medicine

## 2020-01-04 ENCOUNTER — Encounter (HOSPITAL_COMMUNITY): Payer: Self-pay

## 2020-01-04 DIAGNOSIS — F17210 Nicotine dependence, cigarettes, uncomplicated: Secondary | ICD-10-CM | POA: Diagnosis not present

## 2020-01-04 DIAGNOSIS — Z79899 Other long term (current) drug therapy: Secondary | ICD-10-CM | POA: Insufficient documentation

## 2020-01-04 DIAGNOSIS — R1010 Upper abdominal pain, unspecified: Secondary | ICD-10-CM | POA: Insufficient documentation

## 2020-01-04 DIAGNOSIS — Z91040 Latex allergy status: Secondary | ICD-10-CM | POA: Insufficient documentation

## 2020-01-04 DIAGNOSIS — J45909 Unspecified asthma, uncomplicated: Secondary | ICD-10-CM | POA: Diagnosis not present

## 2020-01-04 DIAGNOSIS — R109 Unspecified abdominal pain: Secondary | ICD-10-CM

## 2020-01-04 LAB — URINALYSIS, ROUTINE W REFLEX MICROSCOPIC
Bilirubin Urine: NEGATIVE
Glucose, UA: NEGATIVE mg/dL
Hgb urine dipstick: NEGATIVE
Ketones, ur: NEGATIVE mg/dL
Leukocytes,Ua: NEGATIVE
Nitrite: NEGATIVE
Protein, ur: NEGATIVE mg/dL
Specific Gravity, Urine: 1.021 (ref 1.005–1.030)
pH: 6 (ref 5.0–8.0)

## 2020-01-04 LAB — COMPREHENSIVE METABOLIC PANEL
ALT: 11 U/L (ref 0–44)
AST: 16 U/L (ref 15–41)
Albumin: 3.9 g/dL (ref 3.5–5.0)
Alkaline Phosphatase: 76 U/L (ref 38–126)
Anion gap: 7 (ref 5–15)
BUN: 10 mg/dL (ref 6–20)
CO2: 27 mmol/L (ref 22–32)
Calcium: 9.2 mg/dL (ref 8.9–10.3)
Chloride: 107 mmol/L (ref 98–111)
Creatinine, Ser: 0.72 mg/dL (ref 0.44–1.00)
GFR calc Af Amer: >60 mL/min (ref >60)
GFR calc non Af Amer: >60 mL/min (ref >60)
Glucose, Bld: 87 mg/dL (ref 70–99)
Potassium: 3.3 mmol/L — ABNORMAL LOW (ref 3.5–5.1)
Sodium: 141 mmol/L (ref 135–145)
Total Bilirubin: 0.6 mg/dL (ref 0.3–1.2)
Total Protein: 7.1 g/dL (ref 6.5–8.1)

## 2020-01-04 LAB — CBC
HCT: 39.9 % (ref 36.0–46.0)
Hemoglobin: 12.7 g/dL (ref 12.0–15.0)
MCH: 30.6 pg (ref 26.0–34.0)
MCHC: 31.8 g/dL (ref 30.0–36.0)
MCV: 96.1 fL (ref 80.0–100.0)
Platelets: 387 10*3/uL (ref 150–400)
RBC: 4.15 MIL/uL (ref 3.87–5.11)
RDW: 12.2 % (ref 11.5–15.5)
WBC: 8.6 10*3/uL (ref 4.0–10.5)
nRBC: 0 % (ref 0.0–0.2)

## 2020-01-04 LAB — I-STAT BETA HCG BLOOD, ED (MC, WL, AP ONLY): I-stat hCG, quantitative: <5 m[IU]/mL (ref <5)

## 2020-01-04 LAB — LIPASE, BLOOD: Lipase: 28 U/L (ref 11–51)

## 2020-01-04 MED ORDER — SODIUM CHLORIDE 0.9% FLUSH: 3.0000 mL | Freq: Once | INTRAVENOUS | Status: DC

## 2020-01-04 NOTE — ED Triage Notes (Signed)
Pt reports that she was having abd cramping, took a laxative two days ago and then had diarrhea yesterday. Pt continues to have upper abd pain and nausea

## 2020-01-05 ENCOUNTER — Emergency Department (HOSPITAL_COMMUNITY): Payer: PRIVATE HEALTH INSURANCE

## 2020-01-05 MED ORDER — ONDANSETRON 4 MG PO TBDP: 4.0000 mg | ORAL_TABLET | Freq: Three times a day (TID) | ORAL | 0 refills | Status: DC | PRN

## 2020-01-05 MED ORDER — POTASSIUM CHLORIDE CRYS ER 20 MEQ PO TBCR
40.0000 meq | EXTENDED_RELEASE_TABLET | Freq: Once | ORAL | Status: AC
  Administered 2020-01-05: 40 meq via ORAL
  Filled 2020-01-05: qty 2

## 2020-01-05 MED ORDER — ONDANSETRON 4 MG PO TBDP
4.0000 mg | ORAL_TABLET | Freq: Once | ORAL | Status: AC
  Administered 2020-01-05: 4 mg via ORAL
  Filled 2020-01-05: qty 1

## 2020-01-05 MED ORDER — SUCRALFATE 1 GM/10ML PO SUSP: 1.0000 g | Freq: Three times a day (TID) | ORAL | 0 refills | Status: DC

## 2020-01-05 MED ORDER — ALUM & MAG HYDROXIDE-SIMETH 200-200-20 MG/5ML PO SUSP
30.0000 mL | Freq: Once | ORAL | Status: AC
  Administered 2020-01-05: 30 mL via ORAL
  Filled 2020-01-05: qty 30

## 2020-01-05 MED ORDER — OMEPRAZOLE 20 MG PO CPDR: 20.0000 mg | DELAYED_RELEASE_CAPSULE | Freq: Every day | ORAL | 0 refills | Status: DC

## 2020-01-05 MED ORDER — LIDOCAINE VISCOUS HCL 2 % MT SOLN
15.0000 mL | Freq: Once | OROMUCOSAL | Status: AC
  Administered 2020-01-05: 15 mL via ORAL
  Filled 2020-01-05: qty 15

## 2020-01-05 NOTE — Discharge Instructions (Signed)
You were seen in the emergency department today for abdominal pain.  Your work-up was overall reassuring.  The ultrasound of your gallbladder did not show any abnormalities.  Your labs were all fairly normal with the exception that your potassium was mildly low, you were given a supplement in the ER, we are sending you home with diet instructions to help include potassium rich foods in your diet as well as foods to help with gastroesophageal reflux disease and constipation.  Please see attached diet guidelines.  We are sending you home with Carafate to take prior to meals and bedtime as well as omeprazole to take in the morning prior to your first meal to help with acidity buildup in the stomach.  We are also sending you home with a Zofran to take every 8 hours as needed for nausea and vomiting.  We have prescribed you new medication(s) today. Discuss the medications prescribed today with your pharmacist as they can have adverse effects and interactions with your other medicines including over the counter and prescribed medications. Seek medical evaluation if you start to experience new or abnormal symptoms after taking one of these medicines, seek care immediately if you start to experience difficulty breathing, feeling of your throat closing, facial swelling, or rash as these could be indications of a more serious allergic reaction  We would like you to follow-up with primary care (if you do not have a primary care you may follow-up with our [facility] or call the phone number for primary care your discharge instructions) and or GI within 3 days. Return to the ER for new or worsening symptoms including but not limited to worsened pain, inability to keep fluids down, inability to have a bowel movement/pass gas, blood in stool or vomit, fever, or any other concerns.

## 2020-01-05 NOTE — ED Notes (Signed)
Patient verbalizes understanding of discharge instructions. Opportunity for questioning and answers were provided. Armband removed by staff, pt discharged from ED.  

## 2020-01-05 NOTE — ED Provider Notes (Signed)
[facility] EMERGENCY DEPARTMENT Provider Note   CSN: [id number] Arrival date & time: 01/04/20  2106     History Chief Complaint  Patient presents with  . Abdominal Pain    Lauren Conway is a 29 y.o. female with a history of anemia, anxiety, depression, prior tubal ligation, & prior PID who presents to the ED with complaints of abdominal pain x 3-4 days. Patient states pain is in the upper abdomen, constant uneasy feeling with intermittent cramping and refluxe. Worse after eating. No alleviating factors. Has had some mild associated nausea. Initially was constipated then took laxative and has had several loose BMs, has intermittent problems with constipation- not new. Denies fever, chills, emesis, dysuria, vaginal bleeding, or vaginal discharge. Denies concern for STD. Denies chest pain or dyspnea.   HPI     Past Medical History:  Diagnosis Date  . Anemia   . Anxiety   . Asthma    uses inhaler, last used 8/22  . Depression   . Eczema   . History of chlamydia    12/2008; 03/09/2009  . History of gonorrhea    12/2008; 09/2010;   . 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 PID (pelvic inflammatory disease)     Patient Active Problem List   Diagnosis Date Noted  . Well woman exam with routine gynecological exam 07/01/2019  . LGSIL on Pap smear of cervix 06/12/2018  . Status post tubal ligation 10/29/2017  . Asthma, chronic, mild intermittent, uncomplicated 08/29/2017  . Eczema 08/29/2017  . Anxiety 06/27/2017  . Maternal varicella, non-immune 05/06/2017  . HSV (herpes simplex virus) anogenital infection 05/01/2017    Past Surgical History:  Procedure Laterality Date  . TUBAL LIGATION N/A 10/29/2017   Procedure: POST PARTUM TUBAL LIGATION;  Surgeon: Conan Bowens, MD;  Location: [facility] BIRTHING SU[facility];  Service: Gynecology;  Laterality: N/A;     OB History    Gravida  6   Para  4   Term  3   Preterm  1   AB  2   Living    3     SAB  2   TAB  0   Ectopic  0   Multiple  0   Live Births  3           Family History  Problem Relation Age of Onset  . Hypertension Mother   . Depression Mother   . Breast cancer Paternal Grandmother   . Diabetes Paternal Grandmother   . Asthma Maternal Grandmother   . Other Neg Hx     Social History   Tobacco Use  . Smoking status: Current Every Day Smoker    Packs/day: 0.00    Years: 0.50    Pack years: 0.00    Types: Cigarettes    Last attempt to quit: 03/16/2017    Years since quitting: 2.8  . Smokeless tobacco: Never Used  Substance Use Topics  . Alcohol use: Yes    Alcohol/week: 0.0 standard drinks    Comment: occassionally  . Drug use: Not Currently    Types: Marijuana    Comment: no more    Home Medications Prior to Admission medications   Medication Sig Start Date End Date Taking? Authorizing Provider  acetaminophen (TYLENOL) 500 MG tablet Take 1,000 mg by mouth every 6 (six) hours as needed for moderate pain.     [provider]  albuterol (PROVENTIL HFA;VENTOLIN HFA) 108 (90 Base) MCG/ACT inhaler Inhale  1-2 puffs into the lungs every 6 (six) hours as needed for wheezing or shortness of breath. Patient not taking: Reported on 12/13/2019 03/17/18   Hedges, Tinnie Gens, PA-C  cetirizine (ZYRTEC) 10 MG tablet Take 10 mg by mouth daily.    [provider]  famotidine (PEPCID) 20 MG tablet Take 1 tablet (20 mg total) by mouth 2 (two) times daily. Patient not taking: Reported on 07/01/2019 03/19/19   Henderly, Britni A, PA-C  ibuprofen (ADVIL,MOTRIN) 800 MG tablet Take 1 tablet (800 mg total) by mouth 3 (three) times daily. Patient not taking: Reported on 12/13/2019 02/28/18   Rolm Bookbinder, CNM  loratadine (CLARITIN) 10 MG tablet Take 1 tablet (10 mg total) by mouth daily. Patient not taking: Reported on 07/01/2019 03/17/18   Hedges, Tinnie Gens, PA-C  metroNIDAZOLE (FLAGYL) 500 MG tablet Take 1 tablet (500 mg total) by mouth 2 (two) times  daily. Patient not taking: Reported on 12/13/2019 11/17/19   Brock Bad, MD  metroNIDAZOLE (METROGEL) 0.75 % vaginal gel Place 1 Applicatorful vaginally at bedtime. Apply one applicatorful to vagina at bedtime for 5 days Patient not taking: Reported on 12/13/2019 12/07/19   Brock Bad, MD    Allergies    Eggs or egg-derived products, Latex, and Pecan nut (diagnostic)  Review of Systems   Review of Systems  Constitutional: Negative for chills and fever.  Respiratory: Negative for cough and shortness of breath.   Cardiovascular: Negative for chest pain.  Gastrointestinal: Positive for abdominal pain, constipation, diarrhea and nausea. Negative for anal bleeding, blood in stool and vomiting.  Genitourinary: Negative for dysuria, frequency, pelvic pain, urgency, vaginal bleeding and vaginal discharge.  Neurological: Negative for syncope.  All other systems reviewed and are negative.   Physical Exam Updated Vital Signs BP 133/86 (BP Location: Right Arm)   Pulse 67   Temp 98 F (36.7 C)   Resp 16   SpO2 99%   Physical Exam Vitals and nursing note reviewed.  Constitutional:      General: She is not in acute distress.    Appearance: She is well-developed. She is not toxic-appearing.  HENT:     Head: Normocephalic and atraumatic.  Eyes:     General:        Right eye: No discharge.        Left eye: No discharge.     Conjunctiva/sclera: Conjunctivae normal.  Cardiovascular:     Rate and Rhythm: Normal rate and regular rhythm.  Pulmonary:     Effort: Pulmonary effort is normal. No respiratory distress.     Breath sounds: Normal breath sounds. No wheezing, rhonchi or rales.  Abdominal:     General: There is no distension.     Palpations: Abdomen is soft.     Tenderness: There is abdominal tenderness in the right upper quadrant, epigastric area and left upper quadrant. There is no guarding or rebound. Negative signs include Murphy's sign and McBurney's sign.    Musculoskeletal:     Cervical back: Neck supple.  Skin:    General: Skin is warm and dry.     Findings: No rash.  Neurological:     Mental Status: She is alert.     Comments: Clear speech.   Psychiatric:        Behavior: Behavior normal.     ED Results / Procedures / Treatments   Labs (all labs ordered are listed, but only abnormal results are displayed) Labs Reviewed  COMPREHENSIVE METABOLIC PANEL - Abnormal; Notable for  the following components:      Result Value   Potassium 3.3 (*)    All other components within normal limits  URINALYSIS, ROUTINE W REFLEX MICROSCOPIC - Abnormal; Notable for the following components:   APPearance HAZY (*)    All other components within normal limits  LIPASE, BLOOD  CBC  I-STAT BETA HCG BLOOD, ED ([facility], [facility], [facility] ONLY)    EKG None  Radiology No results found.  Procedures Procedures (including critical care time)  Medications Ordered in ED Medications  sodium chloride flush (NS) 0.9 % injection 3 mL (has no administration in time range)  alum & mag hydroxide-simeth (MAALOX/MYLANTA) 200-200-20 MG/5ML suspension 30 mL (30 mLs Oral Given 01/05/20 0211)    And  lidocaine (XYLOCAINE) 2 % viscous mouth solution 15 mL (15 mLs Oral Given 01/05/20 0211)  ondansetron (ZOFRAN-ODT) disintegrating tablet 4 mg (4 mg Oral Given 01/05/20 0211)    ED Course  I have reviewed the triage vital signs and the nursing notes.  Pertinent labs & imaging results that were available during my care of the patient were reviewed by me and considered in my medical decision making (see chart for details).    MDM Rules/Calculators/A&P                      Patient presents to the ED with complaints of abdominal pain. Patient nontoxic appearing, in no apparent distress, vitals without significant past medical hx. Tender to upper abdomen. no peritoneal signs.  ER work-up reviewed:  CBC: No leukocytosis or anemia.  CMP: mild hypokalemia- oral replacement. No  significant electrolyte derangement. Renal function & LFTs WNL Lipase: WNL UA: no UTI Preg test: Negative Imaging: RUQ [place] negative  On repeat abdominal exam patient remains without peritoneal signs, she is tolerating PO, she is having BMs, doubt cholecystitis, pancreatitis, diverticulitis, appendicitis, bowel obstruction/perforation. Preg test negative- doubt ectopic pregnancy. No lower abdominal discomfort, vaginal discharge, or concern for STD- doubt PID.  Patient feeling improved.. Will discharge home with supportive measures. I discussed results, treatment plan, need for PCP follow-up, and return precautions with the patient. Provided opportunity for questions, patient confirmed understanding and is in agreement with plan.   Final Clinical Impression(s) / ED Diagnoses Final diagnoses:  Abdominal pain    Rx / DC Orders ED Discharge Orders         Ordered    sucralfate (CARAFATE) 1 GM/10ML suspension  3 times daily with meals & bedtime     01/05/20 0353    ondansetron (ZOFRAN ODT) 4 MG disintegrating tablet  Every 8 hours PRN     01/05/20 0353    omeprazole (PRILOSEC) 20 MG capsule  Daily     01/05/20 0353           Amaryllis Dyke, PA-C 01/05/20 0401    Deno Etienne, DO 01/05/20 0404

## 2020-02-15 ENCOUNTER — Encounter (HOSPITAL_COMMUNITY): Payer: Self-pay

## 2020-02-15 ENCOUNTER — Emergency Department (HOSPITAL_COMMUNITY)
Admission: EM | Admit: 2020-02-15 | Discharge: 2020-02-15 | Disposition: A | Discharge status: Home / Self Care | Payer: PRIVATE HEALTH INSURANCE | Attending: Emergency Medicine | Admitting: Emergency Medicine

## 2020-02-15 ENCOUNTER — Other Ambulatory Visit: Payer: Self-pay

## 2020-02-15 DIAGNOSIS — Z91040 Latex allergy status: Secondary | ICD-10-CM | POA: Insufficient documentation

## 2020-02-15 DIAGNOSIS — Z79899 Other long term (current) drug therapy: Secondary | ICD-10-CM | POA: Insufficient documentation

## 2020-02-15 DIAGNOSIS — J45909 Unspecified asthma, uncomplicated: Secondary | ICD-10-CM | POA: Diagnosis not present

## 2020-02-15 DIAGNOSIS — Z91010 Allergy to peanuts: Secondary | ICD-10-CM | POA: Insufficient documentation

## 2020-02-15 DIAGNOSIS — F17210 Nicotine dependence, cigarettes, uncomplicated: Secondary | ICD-10-CM | POA: Diagnosis not present

## 2020-02-15 DIAGNOSIS — K1379 Other lesions of oral mucosa: Secondary | ICD-10-CM | POA: Diagnosis not present

## 2020-02-15 NOTE — ED Provider Notes (Signed)
[name] Note   CSN: [id number] Arrival date & time: 02/15/20  1657     History Chief Complaint  Patient presents with   Mouth Bleeding    Lauren Conway is a 29 y.o. female.  Patient is a 29 year old female with past medical history of anxiety, asthma, depression, anemia presenting to the emergency department for mouth bleeding.  Patient reports that prior to arrival she was brushing her teeth and then noticed that when she spit she was spitting out blood.  This has since resolved but reports feeling irritation in the back of her throat.  Reports that her coworker looked in her mouth and saw an area to the right side that was bleeding.  Denies any fever, chills, chest pain, shortness of breath, other URI symptoms, trouble breathing or swallowing.  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   PID (pelvic inflammatory disease)     Patient Active Problem List   Diagnosis Date Noted   Well woman exam with routine gynecological exam 07/01/2019   LGSIL on Pap smear of cervix 06/12/2018   Status post tubal ligation 10/29/2017   Asthma, chronic, mild intermittent, uncomplicated 43/32/9518   Eczema 08/29/2017   Anxiety 06/27/2017   Maternal varicella, non-immune 05/06/2017   HSV (herpes simplex virus) anogenital infection 05/01/2017    Past Surgical History:  Procedure Laterality Date   TUBAL LIGATION N/A 10/29/2017   Procedure: POST PARTUM TUBAL LIGATION;  Surgeon: Sloan Leiter, MD;  Location: [place];  Service: Gynecology;  Laterality: N/A;     OB History    Gravida  6   Para  4   Term  3     Preterm  1   AB  2   Living  3     SAB  2   TAB  0   Ectopic  0   Multiple  0   Live Births  3           Family History  Problem Relation Age of Onset   Hypertension Mother    Depression Mother    Breast cancer Paternal Grandmother    Diabetes Paternal Grandmother    Asthma Maternal Grandmother    Other Neg Hx     Social History   Tobacco Use   Smoking status: Current Every Day Smoker    Packs/day: 0.00    Years: 0.50    Pack years: 0.00    Types: Cigarettes    Last attempt to quit: 03/16/2017    Years since quitting: 2.9   Smokeless tobacco: Never Used  Substance Use Topics   Alcohol use: Yes    Alcohol/week: 0.0 standard drinks    Comment: occassionally   Drug use: Not Currently    Types: Marijuana    Comment: no more    Home Medications Prior to Admission medications   Medication Sig Start Date End Date Taking? Authorizing Provider  acetaminophen (TYLENOL) 500 MG tablet Take 1,000 mg by mouth every 6 (six) hours as needed for moderate  pain.     [provider]  albuterol (PROVENTIL HFA;VENTOLIN HFA) 108 (90 Base) MCG/ACT inhaler Inhale 1-2 puffs into the lungs every 6 (six) hours as needed for wheezing or shortness of breath. Patient not taking: Reported on 12/13/2019 03/17/18   Hedges, Tinnie Gens, PA-C  cetirizine (ZYRTEC) 10 MG tablet Take 10 mg by mouth daily.    [provider]  famotidine (PEPCID) 20 MG tablet Take 1 tablet (20 mg total) by mouth 2 (two) times daily. Patient not taking: Reported on 07/01/2019 03/19/19   Henderly, Britni A, PA-C  ibuprofen (ADVIL,MOTRIN) 800 MG tablet Take 1 tablet (800 mg total) by mouth 3 (three) times daily. Patient not taking: Reported on 12/13/2019 02/28/18   Rolm Bookbinder, CNM  loratadine (CLARITIN) 10 MG tablet Take 1 tablet (10 mg total) by mouth daily. Patient not taking: Reported on 07/01/2019 03/17/18   Hedges, Tinnie Gens, PA-C  metroNIDAZOLE (FLAGYL) 500 MG tablet Take 1 tablet (500  mg total) by mouth 2 (two) times daily. Patient not taking: Reported on 12/13/2019 11/17/19   Brock Bad, MD  metroNIDAZOLE (METROGEL) 0.75 % vaginal gel Place 1 Applicatorful vaginally at bedtime. Apply one applicatorful to vagina at bedtime for 5 days Patient not taking: Reported on 12/13/2019 12/07/19   Brock Bad, MD  omeprazole (PRILOSEC) 20 MG capsule Take 1 capsule (20 mg total) by mouth daily. 01/05/20   Petrucelli, Samantha R, PA-C  ondansetron (ZOFRAN ODT) 4 MG disintegrating tablet Take 1 tablet (4 mg total) by mouth every 8 (eight) hours as needed for nausea or vomiting. 01/05/20   Petrucelli, Samantha R, PA-C  sucralfate (CARAFATE) 1 GM/10ML suspension Take 10 mLs (1 g total) by mouth 4 (four) times daily -  with meals and at bedtime. 01/05/20   Petrucelli, Samantha R, PA-C    Allergies    Eggs or egg-derived products, Latex, and Pecan nut (diagnostic)  Review of Systems   Review of Systems  Constitutional: Negative for appetite change, chills and fever.  HENT: Positive for mouth sores and sore throat. Negative for dental problem, nosebleeds and rhinorrhea.   Respiratory: Negative for cough and shortness of breath.   Cardiovascular: Negative for chest pain.  Skin: Negative for rash.  Neurological: Negative for dizziness.  Hematological: Does not bruise/bleed easily.    Physical Exam Updated Vital Signs BP 132/85    Pulse 79    Temp 98 F (36.7 C) (Oral)    Resp 16    Ht 5\' 3"  (1.6 m)    Wt 82.1 kg    SpO2 100%    BMI 32.06 kg/m   Physical Exam Vitals and nursing note reviewed.  Constitutional:      General: She is not in acute distress.    Appearance: Normal appearance. She is not ill-appearing, toxic-appearing or diaphoretic.  HENT:     Head: Normocephalic and atraumatic.     Nose: Nose normal.     Mouth/Throat:     Mouth: Mucous membranes are moist.     Dentition: Normal dentition. No gingival swelling, dental abscesses or gum lesions.     Tongue: No  lesions. Tongue does not deviate from midline.     Palate: No mass and lesions.     Pharynx: Oropharynx is clear. Uvula midline. No pharyngeal swelling, oropharyngeal exudate, posterior oropharyngeal erythema or uvula swelling.     Comments: Normal-appearing oral exam without any bleeding or abrasions noted. Eyes:     Conjunctiva/sclera: Conjunctivae normal.  Pulmonary:  Effort: Pulmonary effort is normal.  Skin:    General: Skin is dry.  Neurological:     Mental Status: She is alert.  Psychiatric:        Mood and Affect: Mood normal.     ED Results / Procedures / Treatments   Labs (all labs ordered are listed, but only abnormal results are displayed) Labs Reviewed - No data to display  EKG None  Radiology No results found.  Procedures Procedures (including critical care time)  Medications Ordered in ED Medications - No data to display  ED Course  I have reviewed the triage vital signs and the nursing notes.  Pertinent labs & imaging results that were available during my care of the patient were reviewed by me and considered in my medical decision making (see chart for details).  Clinical Course as of Feb 14 1942  Tue Feb 15, 2020  1941 Patient with a brief episode of mouth bleeding after brushing her teeth today.  Otherwise normal with resolved symptoms.  Completely normal exam by myself today.  Patient is unhappy with her benign findings today.  I offered patient a strep throat swab but she declined.  Also offered patient a referral to dentist as this may be dental bleeding from brushing her teeth.  Patient insisted on an x-ray or further imaging.  Given her resolved symptoms and normal exam I do not think this is necessary and I explained that to the patient.  Advised her if she has any new or worsening symptoms she may return for reevaluation but otherwise should have routine follow-up with dentist and her primary care doctor   [KM]    Clinical Course User  Index [KM] Jeral Pinch   MDM Rules/Calculators/A&P                      Based on review of vitals, medical screening exam, lab work and/or imaging, there does not appear to be an acute, emergent etiology for the patient's symptoms. Counseled pt on good return precautions and encouraged both PCP and ED follow-up as needed.  Prior to discharge, I also discussed incidental imaging findings with patient in detail and advised appropriate, recommended follow-up in detail.  Clinical Impression: 1. Mouth bleeding     Disposition: Discharge  Prior to providing a prescription for a controlled substance, I independently reviewed the patient's recent prescription history on the [place] Controlled Substance Reporting System. The patient had no recent or regular prescriptions and was deemed appropriate for a brief, less than 3 day prescription of narcotic for acute analgesia.  This note was prepared with assistance of Conservation officer, historic buildings. Occasional wrong-word or sound-a-like substitutions may have occurred due to the inherent limitations of voice recognition software.  Final Clinical Impression(s) / ED Diagnoses Final diagnoses:  Mouth bleeding    Rx / DC Orders ED Discharge Orders    None       Jeral Pinch 02/15/20 1943    Maia Plan, MD 02/16/20 1323

## 2020-02-15 NOTE — ED Triage Notes (Signed)
Pt arrives POV for eval of oral bleeding noted this AM. Pt reports she noticed a weird taste in her mouth, brushed her teeth and noted that she was spitting out blood. Denies known injury/trauma, denies oral pain/abcess/swelling.

## 2020-03-05 ENCOUNTER — Other Ambulatory Visit: Payer: Self-pay

## 2020-03-05 ENCOUNTER — Emergency Department (HOSPITAL_COMMUNITY)
Admission: EM | Admit: 2020-03-05 | Discharge: 2020-03-06 | Disposition: A | Discharge status: Home / Self Care | Payer: PRIVATE HEALTH INSURANCE | Attending: Emergency Medicine | Admitting: Emergency Medicine

## 2020-03-05 ENCOUNTER — Encounter (HOSPITAL_COMMUNITY): Payer: Self-pay | Admitting: Emergency Medicine

## 2020-03-05 DIAGNOSIS — Z91040 Latex allergy status: Secondary | ICD-10-CM | POA: Insufficient documentation

## 2020-03-05 DIAGNOSIS — R112 Nausea with vomiting, unspecified: Secondary | ICD-10-CM | POA: Diagnosis not present

## 2020-03-05 DIAGNOSIS — R197 Diarrhea, unspecified: Secondary | ICD-10-CM | POA: Insufficient documentation

## 2020-03-05 DIAGNOSIS — R1084 Generalized abdominal pain: Secondary | ICD-10-CM

## 2020-03-05 DIAGNOSIS — F17210 Nicotine dependence, cigarettes, uncomplicated: Secondary | ICD-10-CM | POA: Insufficient documentation

## 2020-03-05 DIAGNOSIS — J45909 Unspecified asthma, uncomplicated: Secondary | ICD-10-CM | POA: Insufficient documentation

## 2020-03-05 LAB — URINALYSIS, ROUTINE W REFLEX MICROSCOPIC
Bacteria, UA: NONE SEEN
Bilirubin Urine: NEGATIVE
Glucose, UA: NEGATIVE mg/dL
Hgb urine dipstick: NEGATIVE
Ketones, ur: NEGATIVE mg/dL
Leukocytes,Ua: NEGATIVE
Nitrite: NEGATIVE
Protein, ur: 30 mg/dL — AB
Specific Gravity, Urine: 1.026 (ref 1.005–1.030)
pH: 5 (ref 5.0–8.0)

## 2020-03-05 LAB — COMPREHENSIVE METABOLIC PANEL
ALT: 13 U/L (ref 0–44)
AST: 18 U/L (ref 15–41)
Albumin: 4.8 g/dL (ref 3.5–5.0)
Alkaline Phosphatase: 78 U/L (ref 38–126)
Anion gap: 9 (ref 5–15)
BUN: 15 mg/dL (ref 6–20)
CO2: 24 mmol/L (ref 22–32)
Calcium: 9.6 mg/dL (ref 8.9–10.3)
Chloride: 106 mmol/L (ref 98–111)
Creatinine, Ser: 0.78 mg/dL (ref 0.44–1.00)
GFR calc Af Amer: >60 mL/min (ref >60)
GFR calc non Af Amer: >60 mL/min (ref >60)
Glucose, Bld: 119 mg/dL — ABNORMAL HIGH (ref 70–99)
Potassium: 3.9 mmol/L (ref 3.5–5.1)
Sodium: 139 mmol/L (ref 135–145)
Total Bilirubin: 0.8 mg/dL (ref 0.3–1.2)
Total Protein: 8.6 g/dL — ABNORMAL HIGH (ref 6.5–8.1)

## 2020-03-05 LAB — CBC WITH DIFFERENTIAL/PLATELET
Abs Immature Granulocytes: 0.05 10*3/uL (ref 0.00–0.07)
Basophils Absolute: 0 10*3/uL (ref 0.0–0.1)
Basophils Relative: 0 %
Eosinophils Absolute: 0.1 10*3/uL (ref 0.0–0.5)
Eosinophils Relative: 1 %
HCT: 44.2 % (ref 36.0–46.0)
Hemoglobin: 14.3 g/dL (ref 12.0–15.0)
Immature Granulocytes: 0 %
Lymphocytes Relative: 8 %
Lymphs Abs: 1.2 10*3/uL (ref 0.7–4.0)
MCH: 30.7 pg (ref 26.0–34.0)
MCHC: 32.4 g/dL (ref 30.0–36.0)
MCV: 94.8 fL (ref 80.0–100.0)
Monocytes Absolute: 0.6 10*3/uL (ref 0.1–1.0)
Monocytes Relative: 4 %
Neutro Abs: 13.5 10*3/uL — ABNORMAL HIGH (ref 1.7–7.7)
Neutrophils Relative %: 87 %
Platelets: 332 10*3/uL (ref 150–400)
RBC: 4.66 MIL/uL (ref 3.87–5.11)
RDW: 12.5 % (ref 11.5–15.5)
WBC: 15.4 10*3/uL — ABNORMAL HIGH (ref 4.0–10.5)
nRBC: 0 % (ref 0.0–0.2)

## 2020-03-05 LAB — LIPASE, BLOOD: Lipase: 21 U/L (ref 11–51)

## 2020-03-05 LAB — PREGNANCY, URINE: Preg Test, Ur: NEGATIVE

## 2020-03-05 MED ORDER — ONDANSETRON HCL 4 MG/2ML IJ SOLN
4.0000 mg | Freq: Once | INTRAMUSCULAR | Status: AC
  Administered 2020-03-05: 4 mg via INTRAVENOUS
  Filled 2020-03-05: qty 2

## 2020-03-05 MED ORDER — SODIUM CHLORIDE 0.9 % IV BOLUS
1000.0000 mL | Freq: Once | INTRAVENOUS | Status: AC
  Administered 2020-03-05: 22:00:00 1000 mL via INTRAVENOUS

## 2020-03-05 MED ORDER — DICYCLOMINE HCL 10 MG PO CAPS
10.0000 mg | ORAL_CAPSULE | Freq: Once | ORAL | Status: AC
  Administered 2020-03-05: 10 mg via ORAL
  Filled 2020-03-05: qty 1

## 2020-03-05 NOTE — ED Triage Notes (Signed)
Per EMS, patient from home, c/o generalized abdominal pain with N/V/D since 1900. Patient had two episodes of vomiting with diarrhea after eating a salad.

## 2020-03-05 NOTE — ED Provider Notes (Signed)
[facility] DEPT Provider Note   CSN: [id number] Arrival date & time: 03/05/20  2040     History Chief Complaint  Patient presents with  . Abdominal Pain  . Emesis  . Diarrhea    Lauren Conway is a 29 y.o. female.  29 year old female brought in by EMS with complaint of abdominal pain with nausea, vomiting, diarrhea onset 7 PM tonight.  Patient states that she went to the mall today and as she was leaving she noticed her stomach was hurting describes a generalized cramping discomfort.  Patient thought this was due to being hungry, went home and ate a salad and took a nap, woke up with worsening pain with vomiting and diarrhea.  Diarrhea is described as loose stools, nonbloody.  Emesis is watery, nonbloody nonbilious.  Denies fevers, chills, changes in bladder habits.  No known sick contacts.  No other complaints or concerns.        Past Medical History:  Diagnosis Date  . Anemia   . Anxiety   . Asthma    uses inhaler, last used 8/22  . Depression   . Eczema   . History of chlamydia    12/2008; 03/09/2009  . History of gonorrhea    12/2008; 09/2010;   . 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 PID (pelvic inflammatory disease)     Patient Active Problem List   Diagnosis Date Noted  . Well woman exam with routine gynecological exam 07/01/2019  . LGSIL on Pap smear of cervix 06/12/2018  . Status post tubal ligation 10/29/2017  . Asthma, chronic, mild intermittent, uncomplicated 85/88/5027  . Eczema 08/29/2017  . Anxiety 06/27/2017  . Maternal varicella, non-immune 05/06/2017  . HSV (herpes simplex virus) anogenital infection 05/01/2017    Past Surgical History:  Procedure Laterality Date  . TUBAL LIGATION N/A 10/29/2017   Procedure: POST PARTUM TUBAL LIGATION;  Surgeon: Sloan Leiter, MD;  Location: [facility];  Service: Gynecology;  Laterality: N/A;     OB History    Gravida  6   Para  4   Term   3   Preterm  1   AB  2   Living  3     SAB  2   TAB  0   Ectopic  0   Multiple  0   Live Births  3           Family History  Problem Relation Age of Onset  . Hypertension Mother   . Depression Mother   . Breast cancer Paternal Grandmother   . Diabetes Paternal Grandmother   . Asthma Maternal Grandmother   . Other Neg Hx     Social History   Tobacco Use  . Smoking status: Current Every Day Smoker    Packs/day: 0.00    Years: 0.50    Pack years: 0.00    Types: Cigarettes    Last attempt to quit: 03/16/2017    Years since quitting: 2.9  . Smokeless tobacco: Never Used  Substance Use Topics  . Alcohol use: Yes    Alcohol/week: 0.0 standard drinks    Comment: occassionally  . Drug use: Not Currently    Types: Marijuana    Comment: no more    Home Medications Prior to Admission medications   Medication Sig Start Date End Date Taking? Authorizing Provider  acetaminophen (TYLENOL) 500 MG tablet Take 1,000 mg by mouth every 6 (six) hours as needed for moderate  pain.    Yes [provider]  albuterol (PROVENTIL HFA;VENTOLIN HFA) 108 (90 Base) MCG/ACT inhaler Inhale 1-2 puffs into the lungs every 6 (six) hours as needed for wheezing or shortness of breath. 03/17/18  Yes Hedges, Tinnie Gens, PA-C  dicyclomine (BENTYL) 20 MG tablet Take 1 tablet (20 mg total) by mouth 2 (two) times daily. 03/06/20   Jeannie Fend, PA-C  famotidine (PEPCID) 20 MG tablet Take 1 tablet (20 mg total) by mouth 2 (two) times daily. Patient not taking: Reported on 03/05/2020 03/19/19   Henderly, Britni A, PA-C  ibuprofen (ADVIL,MOTRIN) 800 MG tablet Take 1 tablet (800 mg total) by mouth 3 (three) times daily. Patient not taking: Reported on 03/05/2020 02/28/18   Rolm Bookbinder, CNM  loratadine (CLARITIN) 10 MG tablet Take 1 tablet (10 mg total) by mouth daily. Patient not taking: Reported on 03/05/2020 03/17/18   Hedges, Tinnie Gens, PA-C  metroNIDAZOLE (FLAGYL) 500 MG tablet Take 1 tablet (500  mg total) by mouth 2 (two) times daily. Patient not taking: Reported on 03/05/2020 11/17/19   Brock Bad, MD  metroNIDAZOLE (METROGEL) 0.75 % vaginal gel Place 1 Applicatorful vaginally at bedtime. Apply one applicatorful to vagina at bedtime for 5 days Patient not taking: Reported on 12/13/2019 12/07/19   Brock Bad, MD  omeprazole (PRILOSEC) 20 MG capsule Take 1 capsule (20 mg total) by mouth daily. Patient not taking: Reported on 03/05/2020 01/05/20   Petrucelli, Lelon Mast R, PA-C  ondansetron (ZOFRAN ODT) 4 MG disintegrating tablet Take 1 tablet (4 mg total) by mouth every 8 (eight) hours as needed for nausea or vomiting. 03/06/20   Jeannie Fend, PA-C  sucralfate (CARAFATE) 1 GM/10ML suspension Take 10 mLs (1 g total) by mouth 4 (four) times daily -  with meals and at bedtime. Patient not taking: Reported on 03/05/2020 01/05/20   Petrucelli, Pleas Koch, PA-C    Allergies    Eggs or egg-derived products, Latex, and Pecan nut (diagnostic)  Review of Systems   Review of Systems  Constitutional: Negative for fever.  Respiratory: Negative for shortness of breath.   Cardiovascular: Negative for chest pain.  Gastrointestinal: Positive for abdominal pain, diarrhea, nausea and vomiting. Negative for blood in stool.  Genitourinary: Negative for dysuria and frequency.  Musculoskeletal: Negative for arthralgias and myalgias.  Skin: Negative for rash and wound.  Allergic/Immunologic: Negative for immunocompromised state.  Neurological: Negative for weakness.  Hematological: Negative for adenopathy.  Psychiatric/Behavioral: Negative for confusion.  All other systems reviewed and are negative.   Physical Exam Updated Vital Signs BP 115/66   Pulse 95   Temp 98 F (36.7 C) (Oral)   Resp 15   LMP 02/13/2020   SpO2 99%   Physical Exam Vitals and nursing note reviewed.  Constitutional:      General: She is not in acute distress.    Appearance: She is well-developed. She is not  diaphoretic.  HENT:     Head: Normocephalic and atraumatic.  Cardiovascular:     Rate and Rhythm: Normal rate and regular rhythm.     Heart sounds: Normal heart sounds. No murmur.  Pulmonary:     Effort: Pulmonary effort is normal.     Breath sounds: Normal breath sounds.  Abdominal:     Palpations: Abdomen is soft.     Tenderness: There is no abdominal tenderness. There is no right CVA tenderness or left CVA tenderness.  Skin:    General: Skin is warm and dry.  Neurological:  Mental Status: She is alert and oriented to person, place, and time.  Psychiatric:        Behavior: Behavior normal.     ED Results / Procedures / Treatments   Labs (all labs ordered are listed, but only abnormal results are displayed) Labs Reviewed  CBC WITH DIFFERENTIAL/PLATELET - Abnormal; Notable for the following components:      Result Value   WBC 15.4 (*)    Neutro Abs 13.5 (*)    All other components within normal limits  COMPREHENSIVE METABOLIC PANEL - Abnormal; Notable for the following components:   Glucose, Bld 119 (*)    Total Protein 8.6 (*)    All other components within normal limits  URINALYSIS, ROUTINE W REFLEX MICROSCOPIC - Abnormal; Notable for the following components:   APPearance HAZY (*)    Protein, ur 30 (*)    All other components within normal limits  LIPASE, BLOOD  PREGNANCY, URINE    EKG None  Radiology No results found.  Procedures Procedures (including critical care time)  Medications Ordered in ED Medications  dicyclomine (BENTYL) capsule 10 mg (10 mg Oral Given 03/05/20 2217)  ondansetron (ZOFRAN) injection 4 mg (4 mg Intravenous Given 03/05/20 2217)  sodium chloride 0.9 % bolus 1,000 mL (1,000 mLs Intravenous New Bag/Given 03/05/20 2218)    ED Course  I have reviewed the triage vital signs and the nursing notes.  Pertinent labs & imaging results that were available during my care of the patient were reviewed by me and considered in my medical  decision making (see chart for details).  Clinical Course as of Mar 06 8  Mon Mar 06, 2020  [id number] 29 year old female with nausea, vomiting, diarrhea onset 2 hours prior to arrival in the emergency room after eating a salad today.  No known sick contacts, no blood in emesis or stools.  On exam, patient is well-appearing, abdomen is soft and nontender.  Patient was given IV fluids, Zofran, Bentyl.  CBC with elevated white blood cell count of 15,000, possibly secondary to her vomiting today.  Urinalysis is hazy with protein, no UTI.  Lipase within normal limits, CMP without significant changes.  U. Prag negative. Patient is feeling much better at this time, tolerating p.o. fluids.  Will discharge home with prescription for Bentyl and Zofran.  Advised follow with PCP, return to ER for worsening or concerning symptoms.    [LM]    Clinical Course User Index [LM] Alden Hipp   MDM Rules/Calculators/A&P                      Final Clinical Impression(s) / ED Diagnoses Final diagnoses:  Nausea vomiting and diarrhea  Generalized abdominal pain    Rx / DC Orders ED Discharge Orders         Ordered    dicyclomine (BENTYL) 20 MG tablet  2 times daily     03/06/20 0005    ondansetron (ZOFRAN ODT) 4 MG disintegrating tablet  Every 8 hours PRN     03/06/20 0005           Jeannie Fend, PA-C 03/06/20 0009    Pricilla Loveless, MD 03/07/20 [phone]

## 2020-03-06 MED ORDER — DICYCLOMINE HCL 20 MG PO TABS: 20.0000 mg | ORAL_TABLET | Freq: Two times a day (BID) | ORAL | 0 refills | Status: DC

## 2020-03-06 MED ORDER — ONDANSETRON 4 MG PO TBDP: 4.0000 mg | ORAL_TABLET | Freq: Three times a day (TID) | ORAL | 0 refills | Status: DC | PRN

## 2020-03-06 NOTE — Discharge Instructions (Signed)
Bland diet (bananas, rice, applesauce, toast) with hydrating liquids such as Gatorade.  Advance slowly as tolerated. Zofran and Bentyl as needed as prescribed. Follow-up with primary care if symptoms persist.  Return to ER for worsening or concerning symptoms.

## 2020-03-08 ENCOUNTER — Other Ambulatory Visit: Payer: Self-pay

## 2020-03-08 ENCOUNTER — Emergency Department (HOSPITAL_COMMUNITY)
Admission: EM | Admit: 2020-03-08 | Discharge: 2020-03-09 | Disposition: A | Discharge status: Home / Self Care | Payer: PRIVATE HEALTH INSURANCE | Attending: Emergency Medicine | Admitting: Emergency Medicine

## 2020-03-08 ENCOUNTER — Encounter (HOSPITAL_COMMUNITY): Payer: Self-pay

## 2020-03-08 DIAGNOSIS — R102 Pelvic and perineal pain: Secondary | ICD-10-CM | POA: Diagnosis not present

## 2020-03-08 DIAGNOSIS — Z91040 Latex allergy status: Secondary | ICD-10-CM | POA: Diagnosis not present

## 2020-03-08 DIAGNOSIS — Z79899 Other long term (current) drug therapy: Secondary | ICD-10-CM | POA: Diagnosis not present

## 2020-03-08 DIAGNOSIS — F17210 Nicotine dependence, cigarettes, uncomplicated: Secondary | ICD-10-CM | POA: Insufficient documentation

## 2020-03-08 DIAGNOSIS — J4520 Mild intermittent asthma, uncomplicated: Secondary | ICD-10-CM | POA: Diagnosis not present

## 2020-03-08 LAB — CBC
HCT: 39.4 % (ref 36.0–46.0)
Hemoglobin: 12.8 g/dL (ref 12.0–15.0)
MCH: 30.5 pg (ref 26.0–34.0)
MCHC: 32.5 g/dL (ref 30.0–36.0)
MCV: 94 fL (ref 80.0–100.0)
Platelets: 296 10*3/uL (ref 150–400)
RBC: 4.19 MIL/uL (ref 3.87–5.11)
RDW: 12.1 % (ref 11.5–15.5)
WBC: 7.1 10*3/uL (ref 4.0–10.5)
nRBC: 0 % (ref 0.0–0.2)

## 2020-03-08 LAB — URINALYSIS, ROUTINE W REFLEX MICROSCOPIC
Bilirubin Urine: NEGATIVE
Glucose, UA: NEGATIVE mg/dL
Hgb urine dipstick: NEGATIVE
Ketones, ur: 5 mg/dL — AB
Leukocytes,Ua: NEGATIVE
Nitrite: NEGATIVE
Protein, ur: NEGATIVE mg/dL
Specific Gravity, Urine: 1.03 (ref 1.005–1.030)
pH: 5 (ref 5.0–8.0)

## 2020-03-08 LAB — LIPASE, BLOOD: Lipase: 33 U/L (ref 11–51)

## 2020-03-08 LAB — COMPREHENSIVE METABOLIC PANEL
ALT: 13 U/L (ref 0–44)
AST: 17 U/L (ref 15–41)
Albumin: 4.1 g/dL (ref 3.5–5.0)
Alkaline Phosphatase: 67 U/L (ref 38–126)
Anion gap: 7 (ref 5–15)
BUN: 11 mg/dL (ref 6–20)
CO2: 27 mmol/L (ref 22–32)
Calcium: 9.1 mg/dL (ref 8.9–10.3)
Chloride: 106 mmol/L (ref 98–111)
Creatinine, Ser: 0.73 mg/dL (ref 0.44–1.00)
GFR calc Af Amer: >60 mL/min (ref >60)
GFR calc non Af Amer: >60 mL/min (ref >60)
Glucose, Bld: 105 mg/dL — ABNORMAL HIGH (ref 70–99)
Potassium: 3.2 mmol/L — ABNORMAL LOW (ref 3.5–5.1)
Sodium: 140 mmol/L (ref 135–145)
Total Bilirubin: 0.6 mg/dL (ref 0.3–1.2)
Total Protein: 7.6 g/dL (ref 6.5–8.1)

## 2020-03-08 LAB — I-STAT BETA HCG BLOOD, ED (MC, WL, AP ONLY): I-stat hCG, quantitative: <5 m[IU]/mL (ref <5)

## 2020-03-08 NOTE — ED Triage Notes (Signed)
Pt reports she was here on Sunday with abdominal pain and N/V/D. Pt now endorses lower abdominal cramping. Pt is concerned that she is pregnant.

## 2020-03-09 LAB — WET PREP, GENITAL
Clue Cells Wet Prep HPF POC: NONE SEEN
Sperm: NONE SEEN
Trich, Wet Prep: NONE SEEN
Yeast Wet Prep HPF POC: NONE SEEN

## 2020-03-09 NOTE — ED Notes (Signed)
Set-up for pelvic exam at bedside.  

## 2020-03-09 NOTE — ED Provider Notes (Signed)
[facility] DEPT Provider Note   CSN: [id number] Arrival date & time: 03/08/20  2053     History Chief Complaint  Patient presents with  . Abdominal Pain    Lauren Conway is a 29 y.o. female.  Patient to ED with mild right sided pelvic discomfort. She was here on 3/21 with N, V, D which she states is almost completely resolved. She is concerned because she relates her current symptoms to being pregnant in the past. She has had a tubal ligation. No vaginal discharge, dysuria. She reports she is due to start her regular menses in 4 days. No fever, back pain.  The history is provided by the patient. No language interpreter was used.       Past Medical History:  Diagnosis Date  . Anemia   . Anxiety   . Asthma    uses inhaler, last used 8/22  . Depression   . Eczema   . History of chlamydia    12/2008; 03/09/2009  . History of gonorrhea    12/2008; 09/2010;   . 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 PID (pelvic inflammatory disease)     Patient Active Problem List   Diagnosis Date Noted  . Well woman exam with routine gynecological exam 07/01/2019  . LGSIL on Pap smear of cervix 06/12/2018  . Status post tubal ligation 10/29/2017  . Asthma, chronic, mild intermittent, uncomplicated 03/50/0938  . Eczema 08/29/2017  . Anxiety 06/27/2017  . Maternal varicella, non-immune 05/06/2017  . HSV (herpes simplex virus) anogenital infection 05/01/2017    Past Surgical History:  Procedure Laterality Date  . TUBAL LIGATION N/A 10/29/2017   Procedure: POST PARTUM TUBAL LIGATION;  Surgeon: Sloan Leiter, MD;  Location: [facility];  Service: Gynecology;  Laterality: N/A;     OB History    Gravida  6   Para  4   Term  3   Preterm  1   AB  2   Living  3     SAB  2   TAB  0   Ectopic  0   Multiple  0   Live Births  3           Family History  Problem Relation Age of Onset  . Hypertension  Mother   . Depression Mother   . Breast cancer Paternal Grandmother   . Diabetes Paternal Grandmother   . Asthma Maternal Grandmother   . Other Neg Hx     Social History   Tobacco Use  . Smoking status: Current Every Day Smoker    Packs/day: 0.00    Years: 0.50    Pack years: 0.00    Types: Cigarettes    Last attempt to quit: 03/16/2017    Years since quitting: 2.9  . Smokeless tobacco: Never Used  Substance Use Topics  . Alcohol use: Yes    Alcohol/week: 0.0 standard drinks    Comment: occassionally  . Drug use: Not Currently    Types: Marijuana    Comment: no more    Home Medications Prior to Admission medications   Medication Sig Start Date End Date Taking? Authorizing Provider  acetaminophen (TYLENOL) 500 MG tablet Take 1,000 mg by mouth every 6 (six) hours as needed for moderate pain.    Yes [provider]  albuterol (PROVENTIL HFA;VENTOLIN HFA) 108 (90 Base) MCG/ACT inhaler Inhale 1-2 puffs into the lungs every 6 (six) hours as needed for wheezing or  shortness of breath. 03/17/18  Yes Hedges, Tinnie Gens, PA-C  dicyclomine (BENTYL) 20 MG tablet Take 1 tablet (20 mg total) by mouth 2 (two) times daily. 03/06/20  Yes Jeannie Fend, PA-C  ondansetron (ZOFRAN ODT) 4 MG disintegrating tablet Take 1 tablet (4 mg total) by mouth every 8 (eight) hours as needed for nausea or vomiting. 03/06/20  Yes Jeannie Fend, PA-C  famotidine (PEPCID) 20 MG tablet Take 1 tablet (20 mg total) by mouth 2 (two) times daily. Patient not taking: Reported on 03/05/2020 03/19/19   Henderly, Britni A, PA-C  ibuprofen (ADVIL,MOTRIN) 800 MG tablet Take 1 tablet (800 mg total) by mouth 3 (three) times daily. Patient not taking: Reported on 03/05/2020 02/28/18   Rolm Bookbinder, CNM  loratadine (CLARITIN) 10 MG tablet Take 1 tablet (10 mg total) by mouth daily. Patient not taking: Reported on 03/05/2020 03/17/18   Hedges, Tinnie Gens, PA-C  metroNIDAZOLE (FLAGYL) 500 MG tablet Take 1 tablet (500 mg total) by  mouth 2 (two) times daily. Patient not taking: Reported on 03/05/2020 11/17/19   Brock Bad, MD  metroNIDAZOLE (METROGEL) 0.75 % vaginal gel Place 1 Applicatorful vaginally at bedtime. Apply one applicatorful to vagina at bedtime for 5 days Patient not taking: Reported on 12/13/2019 12/07/19   Brock Bad, MD  omeprazole (PRILOSEC) 20 MG capsule Take 1 capsule (20 mg total) by mouth daily. Patient not taking: Reported on 03/05/2020 01/05/20   Petrucelli, Lelon Mast R, PA-C  sucralfate (CARAFATE) 1 GM/10ML suspension Take 10 mLs (1 g total) by mouth 4 (four) times daily -  with meals and at bedtime. Patient not taking: Reported on 03/05/2020 01/05/20   Petrucelli, Pleas Koch, PA-C    Allergies    Eggs or egg-derived products, Latex, and Pecan nut (diagnostic)  Review of Systems   Review of Systems  Constitutional: Negative for chills and fever.  Gastrointestinal: Negative.  Negative for abdominal pain, diarrhea, nausea and vomiting.  Genitourinary: Positive for pelvic pain. Negative for dysuria, frequency, vaginal bleeding and vaginal discharge.  Musculoskeletal: Negative.   Skin: Negative.   Neurological: Negative.     Physical Exam Updated Vital Signs BP 118/80 (BP Location: Left Arm)   Pulse 78   Temp 98 F (36.7 C) (Oral)   Resp 15   LMP 02/13/2020   SpO2 100%   Physical Exam Vitals and nursing note reviewed.  Constitutional:      Appearance: She is well-developed.  Pulmonary:     Effort: Pulmonary effort is normal.  Abdominal:     Palpations: Abdomen is soft.     Tenderness: There is no abdominal tenderness.  Genitourinary:    Vagina: No vaginal discharge.     Cervix: No cervical motion tenderness, discharge or friability.     Adnexa:        Right: Tenderness (Very mild tenderness of right adnexa) present. No mass or fullness.         Left: No mass, tenderness or fullness.    Musculoskeletal:     Cervical back: Normal range of motion.  Skin:    General:  Skin is warm and dry.  Neurological:     Mental Status: She is alert and oriented to person, place, and time.     ED Results / Procedures / Treatments   Labs (all labs ordered are listed, but only abnormal results are displayed) Labs Reviewed  COMPREHENSIVE METABOLIC PANEL - Abnormal; Notable for the following components:      Result Value  Potassium 3.2 (*)    Glucose, Bld 105 (*)    All other components within normal limits  URINALYSIS, ROUTINE W REFLEX MICROSCOPIC - Abnormal; Notable for the following components:   APPearance HAZY (*)    Ketones, ur 5 (*)    All other components within normal limits  LIPASE, BLOOD  CBC  I-STAT BETA HCG BLOOD, ED ([facility], [facility], [facility] ONLY)    EKG None  Radiology No results found.  Procedures Procedures (including critical care time)  Medications Ordered in ED Medications - No data to display  ED Course  I have reviewed the triage vital signs and the nursing notes.  Pertinent labs & imaging results that were available during my care of the patient were reviewed by me and considered in my medical decision making (see chart for details).  The patient's main stated concern is that she is pregnant based on similar symptoms to last pregnancy. Test is negative.   Her pelvic exam is largely benign. She has very mild right adnexal tenderness not felt to represent TOA, infection or even cyst.   Feel she can be discharged home with OB/GYN follow up as needed.    MDM Rules/Calculators/A&P                      1. Mild pelvic cramping  Final Clinical Impression(s) / ED Diagnoses Final diagnoses:  None    Rx / DC Orders ED Discharge Orders    None       Elpidio Anis, PA-C 03/10/20 0550    Vanetta Mulders, MD 03/22/20 [phone]

## 2020-03-09 NOTE — Discharge Instructions (Addendum)
Your labs are reassuring. There is no pregnancy, no evidence of infection.   If your pelvic discomfort/cramping continue, please see your OB/GYN. If your symptoms worsen or you develop new concern, please return to the emergency department.

## 2020-03-09 NOTE — ED Notes (Signed)
Shari, PA at bedside. 

## 2020-03-10 LAB — GC/CHLAMYDIA PROBE AMP (~~LOC~~) NOT AT ARMC
Chlamydia: NEGATIVE
Neisseria Gonorrhea: NEGATIVE

## 2020-05-11 ENCOUNTER — Other Ambulatory Visit: Payer: Self-pay

## 2020-05-11 ENCOUNTER — Encounter (HOSPITAL_COMMUNITY): Payer: Self-pay

## 2020-05-11 ENCOUNTER — Emergency Department (HOSPITAL_COMMUNITY)
Admission: EM | Admit: 2020-05-11 | Discharge: 2020-05-11 | Disposition: A | Discharge status: Home / Self Care | Payer: BC Managed Care – PPO | Attending: Emergency Medicine | Admitting: Emergency Medicine

## 2020-05-11 DIAGNOSIS — Z5321 Procedure and treatment not carried out due to patient leaving prior to being seen by health care provider: Secondary | ICD-10-CM | POA: Diagnosis not present

## 2020-05-11 DIAGNOSIS — R0981 Nasal congestion: Secondary | ICD-10-CM | POA: Insufficient documentation

## 2020-05-11 NOTE — ED Triage Notes (Signed)
Pt c/o cough, runny nose, headache, and watery eyes x2 days. Pt states using albuterol inhaler.

## 2020-05-14 ENCOUNTER — Emergency Department (HOSPITAL_COMMUNITY)
Admission: EM | Admit: 2020-05-14 | Discharge: 2020-05-14 | Disposition: A | Discharge status: Home / Self Care | Payer: BC Managed Care – PPO | Attending: Emergency Medicine | Admitting: Emergency Medicine

## 2020-05-14 ENCOUNTER — Encounter (HOSPITAL_COMMUNITY): Payer: Self-pay | Admitting: Emergency Medicine

## 2020-05-14 ENCOUNTER — Other Ambulatory Visit: Payer: Self-pay

## 2020-05-14 DIAGNOSIS — R0981 Nasal congestion: Secondary | ICD-10-CM | POA: Diagnosis not present

## 2020-05-14 DIAGNOSIS — F17210 Nicotine dependence, cigarettes, uncomplicated: Secondary | ICD-10-CM | POA: Insufficient documentation

## 2020-05-14 DIAGNOSIS — R110 Nausea: Secondary | ICD-10-CM | POA: Diagnosis not present

## 2020-05-14 DIAGNOSIS — R0602 Shortness of breath: Secondary | ICD-10-CM | POA: Insufficient documentation

## 2020-05-14 DIAGNOSIS — Z20822 Contact with and (suspected) exposure to covid-19: Secondary | ICD-10-CM | POA: Insufficient documentation

## 2020-05-14 DIAGNOSIS — R439 Unspecified disturbances of smell and taste: Secondary | ICD-10-CM | POA: Insufficient documentation

## 2020-05-14 DIAGNOSIS — R05 Cough: Secondary | ICD-10-CM | POA: Insufficient documentation

## 2020-05-14 DIAGNOSIS — Z91040 Latex allergy status: Secondary | ICD-10-CM | POA: Insufficient documentation

## 2020-05-14 DIAGNOSIS — J029 Acute pharyngitis, unspecified: Secondary | ICD-10-CM | POA: Diagnosis not present

## 2020-05-14 DIAGNOSIS — R059 Cough, unspecified: Secondary | ICD-10-CM

## 2020-05-14 DIAGNOSIS — J45909 Unspecified asthma, uncomplicated: Secondary | ICD-10-CM | POA: Insufficient documentation

## 2020-05-14 LAB — SARS CORONAVIRUS 2 BY RT PCR (HOSPITAL ORDER, PERFORMED IN ~~LOC~~ HOSPITAL LAB): SARS Coronavirus 2: NEGATIVE

## 2020-05-14 MED ORDER — BENZONATATE 100 MG PO CAPS: 100.0000 mg | ORAL_CAPSULE | Freq: Three times a day (TID) | ORAL | 0 refills | Status: DC

## 2020-05-14 MED ORDER — ALBUTEROL SULFATE HFA 108 (90 BASE) MCG/ACT IN AERS: 1.0000 | INHALATION_SPRAY | Freq: Four times a day (QID) | RESPIRATORY_TRACT | 0 refills | Status: DC | PRN

## 2020-05-14 MED ORDER — ONDANSETRON 4 MG PO TBDP: 4.0000 mg | ORAL_TABLET | Freq: Three times a day (TID) | ORAL | 0 refills | Status: DC | PRN

## 2020-05-14 NOTE — ED Notes (Signed)
Pt ambulated in hallway. SpO2 maintained 98-99%. Pt had no complaints during ambulation.

## 2020-05-14 NOTE — Discharge Instructions (Signed)
As discussed, your symptoms are most likely related to Covid or another viral infection.  I am sending home with symptomatic treatment.  Take as prescribed.  Your Covid test is pending.  Results should be available within the next 2 hours.  Continue to self quarantine until results are available.  If your Covid test is positive you must self quarantine for 10 days since symptom onset and be fever free for 3 days without any medication.  Return to the ER for new or worsening symptoms.

## 2020-05-14 NOTE — ED Provider Notes (Signed)
[facility] EMERGENCY DEPARTMENT Provider Note   CSN: [id number] Arrival date & time: 05/14/20  1603     History Chief Complaint  Patient presents with  . Cough  . Nasal Congestion  . Sore Throat    Lauren Conway is a 29 y.o. female with a past medical history significant for anemia, anxiety, asthma, and depression presents to the ED due to a dry cough, sore throat, loss of taste, and nasal congestion x5 days.  Patient is an Glass blower/designer at [employer].  All of her children at home are sick with similar symptoms.  Patient notes her cough is dry in nature and has been persistent since Wednesday.  Notes her sore throat has improved.  Denies difficulty swallowing and neck pain.  Denies fever and chills.  She admits to intermittent episodes of shortness of breath, mostly with exertion.  Denies associated chest pain and lower extremity edema.  Patient has a history of asthma has been using her albuterol inhaler with moderate relief.  She had a rapid Covid test on Friday which was negative.  She also admits to intermittent episodes of nausea, but denies vomiting, diarrhea, and abdominal pain.  She has not received her Covid vaccine yet.  She has tried over-the-counter Mucinex with mild relief.  Denies aggravating and alleviating factors.  History obtained from patient and past medical records. No interpreter used during encounter.      Past Medical History:  Diagnosis Date  . Anemia   . Anxiety   . Asthma    uses inhaler, last used 8/22  . Depression   . Eczema   . History of chlamydia    12/2008; 03/09/2009  . History of gonorrhea    12/2008; 09/2010;   . 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 PID (pelvic inflammatory disease)     Patient Active Problem List   Diagnosis Date Noted  . Well woman exam with routine gynecological exam 07/01/2019  . LGSIL on Pap smear of cervix 06/12/2018  . Status post tubal ligation 10/29/2017  .  Asthma, chronic, mild intermittent, uncomplicated 27/02/5008  . Eczema 08/29/2017  . Anxiety 06/27/2017  . Maternal varicella, non-immune 05/06/2017  . HSV (herpes simplex virus) anogenital infection 05/01/2017    Past Surgical History:  Procedure Laterality Date  . TUBAL LIGATION N/A 10/29/2017   Procedure: POST PARTUM TUBAL LIGATION;  Surgeon: Sloan Leiter, MD;  Location: [facility];  Service: Gynecology;  Laterality: N/A;     OB History    Gravida  6   Para  4   Term  3   Preterm  1   AB  2   Living  3     SAB  2   TAB  0   Ectopic  0   Multiple  0   Live Births  3           Family History  Problem Relation Age of Onset  . Hypertension Mother   . Depression Mother   . Breast cancer Paternal Grandmother   . Diabetes Paternal Grandmother   . Asthma Maternal Grandmother   . Other Neg Hx     Social History   Tobacco Use  . Smoking status: Current Every Day Smoker    Packs/day: 0.00    Years: 0.50    Pack years: 0.00    Types: Cigarettes    Last attempt to quit: 03/16/2017    Years since quitting: 3.1  .  Smokeless tobacco: Never Used  Substance Use Topics  . Alcohol use: Yes    Alcohol/week: 0.0 standard drinks    Comment: occassionally  . Drug use: Not Currently    Types: Marijuana    Comment: no more    Home Medications Prior to Admission medications   Medication Sig Start Date End Date Taking? Authorizing Provider  acetaminophen (TYLENOL) 500 MG tablet Take 1,000 mg by mouth every 6 (six) hours as needed for moderate pain.     [provider]  albuterol (PROVENTIL HFA;VENTOLIN HFA) 108 (90 Base) MCG/ACT inhaler Inhale 1-2 puffs into the lungs every 6 (six) hours as needed for wheezing or shortness of breath. 03/17/18   Hedges, Tinnie Gens, PA-C  albuterol (VENTOLIN HFA) 108 (90 Base) MCG/ACT inhaler Inhale 1-2 puffs into the lungs every 6 (six) hours as needed for wheezing or shortness of breath. 05/14/20   Mannie Stabile,  PA-C  benzonatate (TESSALON) 100 MG capsule Take 1 capsule (100 mg total) by mouth every 8 (eight) hours. 05/14/20   Mannie Stabile, PA-C  dicyclomine (BENTYL) 20 MG tablet Take 1 tablet (20 mg total) by mouth 2 (two) times daily. 03/06/20   Jeannie Fend, PA-C  famotidine (PEPCID) 20 MG tablet Take 1 tablet (20 mg total) by mouth 2 (two) times daily. Patient not taking: Reported on 03/05/2020 03/19/19   Henderly, Britni A, PA-C  ibuprofen (ADVIL,MOTRIN) 800 MG tablet Take 1 tablet (800 mg total) by mouth 3 (three) times daily. Patient not taking: Reported on 03/05/2020 02/28/18   Rolm Bookbinder, CNM  loratadine (CLARITIN) 10 MG tablet Take 1 tablet (10 mg total) by mouth daily. Patient not taking: Reported on 03/05/2020 03/17/18   Hedges, Tinnie Gens, PA-C  metroNIDAZOLE (FLAGYL) 500 MG tablet Take 1 tablet (500 mg total) by mouth 2 (two) times daily. Patient not taking: Reported on 03/05/2020 11/17/19   Brock Bad, MD  metroNIDAZOLE (METROGEL) 0.75 % vaginal gel Place 1 Applicatorful vaginally at bedtime. Apply one applicatorful to vagina at bedtime for 5 days Patient not taking: Reported on 12/13/2019 12/07/19   Brock Bad, MD  omeprazole (PRILOSEC) 20 MG capsule Take 1 capsule (20 mg total) by mouth daily. Patient not taking: Reported on 03/05/2020 01/05/20   Petrucelli, Lelon Mast R, PA-C  ondansetron (ZOFRAN ODT) 4 MG disintegrating tablet Take 1 tablet (4 mg total) by mouth every 8 (eight) hours as needed for nausea or vomiting. 03/06/20   Jeannie Fend, PA-C  ondansetron (ZOFRAN ODT) 4 MG disintegrating tablet Take 1 tablet (4 mg total) by mouth every 8 (eight) hours as needed for nausea or vomiting. 05/14/20   Mannie Stabile, PA-C  sucralfate (CARAFATE) 1 GM/10ML suspension Take 10 mLs (1 g total) by mouth 4 (four) times daily -  with meals and at bedtime. Patient not taking: Reported on 03/05/2020 01/05/20   Petrucelli, Pleas Koch, PA-C    Allergies    Eggs or egg-derived  products, Latex, and Pecan nut (diagnostic)  Review of Systems   Review of Systems  Constitutional: Negative for chills and fever.  HENT: Positive for congestion and sore throat.   Respiratory: Positive for cough and shortness of breath.   Cardiovascular: Negative for chest pain and leg swelling.  Gastrointestinal: Positive for nausea. Negative for abdominal pain, diarrhea and vomiting.  Genitourinary: Negative for dysuria.  Musculoskeletal: Negative for neck pain.  Neurological: Negative for headaches.  All other systems reviewed and are negative.   Physical Exam Updated Vital Signs  BP 119/78 (BP Location: Right Arm)   Pulse 72   Temp 98.1 F (36.7 C) (Oral)   Resp 17   Wt 83.5 kg   LMP 05/05/2020   SpO2 99%   BMI 31.58 kg/m   Physical Exam Vitals and nursing note reviewed.  Constitutional:      General: She is not in acute distress.    Appearance: She is not ill-appearing.  HENT:     Head: Normocephalic.     Mouth/Throat:     Comments: Posterior oropharynx clear and mucous membranes moist, there is mild erythema but no edema or tonsillar exudates, uvula midline, normal phonation, no trismus, tolerating secretions without difficulty. Eyes:     Pupils: Pupils are equal, round, and reactive to light.  Neck:     Comments: No meningismus. Cardiovascular:     Rate and Rhythm: Normal rate and regular rhythm.     Pulses: Normal pulses.     Heart sounds: Normal heart sounds. No murmur. No friction rub. No gallop.   Pulmonary:     Effort: Pulmonary effort is normal.     Breath sounds: Normal breath sounds.     Comments: Respirations equal and unlabored, patient able to speak in full sentences, lungs clear to auscultation bilaterally Abdominal:     General: Abdomen is flat. Bowel sounds are normal. There is no distension.     Palpations: Abdomen is soft.     Tenderness: There is no abdominal tenderness. There is no guarding or rebound.  Musculoskeletal:     Cervical back:  Neck supple.     Comments: No lower extremity edema.  Negative Homans' sign bilaterally.  Skin:    General: Skin is warm and dry.  Neurological:     General: No focal deficit present.     Mental Status: She is alert.  Psychiatric:        Mood and Affect: Mood normal.        Behavior: Behavior normal.     ED Results / Procedures / Treatments   Labs (all labs ordered are listed, but only abnormal results are displayed) Labs Reviewed  SARS CORONAVIRUS 2 BY RT PCR (HOSPITAL ORDER, PERFORMED IN [facility])    EKG None  Radiology No results found.  Procedures Procedures (including critical care time)  Medications Ordered in ED Medications - No data to display  ED Course  I have reviewed the triage vital signs and the nursing notes.  Pertinent labs & imaging results that were available during my care of the patient were reviewed by me and considered in my medical decision making (see chart for details).    MDM Rules/Calculators/A&P                     29 year old female presents to the ED due to Covid-like symptoms for the past 5 days.  Children sick at home with similar symptoms.  Vitals all within normal limits.  Patient is afebrile, not tachycardic or hypoxic.  Patient in no acute distress and non-ill-appearing.  Physical exam reassuring.  Lungs clear to auscultation bilaterally.  No wheeze to suggest asthma exacerbation.  Abdomen soft, nondistended, nontender.  Mild erythema in the throat with no tonsillar hypertrophy or exudates.  No meningismus to suggest meningitis.  No lower extremity edema.  Negative Homans' sign bilaterally.  PERC negative and low risk using Wells criteria.  Low suspicion for PE/DVT.  Suspect symptoms related to Covid infection versus other viral etiology.  Covid test  pending.  Will treat patient symptomatically at this time with cough medication, Zofran, albuterol.  Patient ambulated in the ED and maintained O2 saturation above 95% the entire  time without any difficulties.  Self quarantine protocol discussed with patient.  Work note provided.  Instructed patient to follow-up with PCP symptoms do not improve within the next week. Strict ED precautions discussed with patient. Patient states understanding and agrees to plan. Patient discharged home in no acute distress and stable vitals   Lauren Conway was evaluated in Emergency Department on 05/14/2020 for the symptoms described in the history of present illness. She was evaluated in the context of the global COVID-19 pandemic, which necessitated consideration that the patient might be at risk for infection with the SARS-CoV-2 virus that causes COVID-19. Institutional protocols and algorithms that pertain to the evaluation of patients at risk for COVID-19 are in a state of rapid change based on information released by regulatory bodies including the CDC and federal and state organizations. These policies and algorithms were followed during the patient's care in the ED.  Final Clinical Impression(s) / ED Diagnoses Final diagnoses:  Suspected COVID-19 virus infection    Rx / DC Orders ED Discharge Orders         Ordered    ondansetron (ZOFRAN ODT) 4 MG disintegrating tablet  Every 8 hours PRN     05/14/20 1621    benzonatate (TESSALON) 100 MG capsule  Every 8 hours     05/14/20 1621    albuterol (VENTOLIN HFA) 108 (90 Base) MCG/ACT inhaler  Every 6 hours PRN     05/14/20 1621           Mannie Stabile, PA-C 05/14/20 1649    Melene Plan, DO 05/14/20 2149

## 2020-05-14 NOTE — ED Triage Notes (Signed)
Pt comes in with cough for several days with sore thoat, sneeze and runny nose. Pt works at [employer]. Pts children at sick. NAD.

## 2020-05-18 ENCOUNTER — Other Ambulatory Visit: Payer: Self-pay | Admitting: Nurse Practitioner

## 2020-05-18 ENCOUNTER — Other Ambulatory Visit (HOSPITAL_COMMUNITY)
Admission: RE | Admit: 2020-05-18 | Discharge: 2020-05-18 | Disposition: A | Discharge status: Home / Self Care | Payer: BC Managed Care – PPO | Source: Ambulatory Visit | Attending: Nurse Practitioner | Admitting: Nurse Practitioner

## 2020-05-18 DIAGNOSIS — N898 Other specified noninflammatory disorders of vagina: Secondary | ICD-10-CM | POA: Diagnosis present

## 2020-05-19 LAB — MOLECULAR ANCILLARY ONLY
Bacterial Vaginitis (gardnerella): POSITIVE — AB
Candida Glabrata: NEGATIVE
Candida Vaginitis: POSITIVE — AB
Chlamydia: NEGATIVE
Comment: NEGATIVE
Comment: NEGATIVE
Comment: NEGATIVE
Comment: NEGATIVE
Comment: NEGATIVE
Comment: NORMAL
Neisseria Gonorrhea: NEGATIVE
Trichomonas: NEGATIVE

## 2020-06-16 ENCOUNTER — Emergency Department (HOSPITAL_COMMUNITY)
Admission: EM | Admit: 2020-06-16 | Discharge: 2020-06-16 | Disposition: A | Discharge status: Home / Self Care | Payer: BC Managed Care – PPO | Attending: Emergency Medicine | Admitting: Emergency Medicine

## 2020-06-16 ENCOUNTER — Other Ambulatory Visit: Payer: Self-pay

## 2020-06-16 ENCOUNTER — Emergency Department (HOSPITAL_COMMUNITY): Payer: BC Managed Care – PPO

## 2020-06-16 ENCOUNTER — Encounter (HOSPITAL_COMMUNITY): Payer: Self-pay | Admitting: Emergency Medicine

## 2020-06-16 DIAGNOSIS — Z91040 Latex allergy status: Secondary | ICD-10-CM | POA: Diagnosis not present

## 2020-06-16 DIAGNOSIS — J069 Acute upper respiratory infection, unspecified: Secondary | ICD-10-CM | POA: Diagnosis not present

## 2020-06-16 DIAGNOSIS — F17210 Nicotine dependence, cigarettes, uncomplicated: Secondary | ICD-10-CM | POA: Diagnosis not present

## 2020-06-16 DIAGNOSIS — Z8709 Personal history of other diseases of the respiratory system: Secondary | ICD-10-CM | POA: Diagnosis not present

## 2020-06-16 DIAGNOSIS — R05 Cough: Secondary | ICD-10-CM | POA: Diagnosis present

## 2020-06-16 MED ORDER — PREDNISONE 20 MG PO TABS: 40.0000 mg | ORAL_TABLET | Freq: Every day | ORAL | 0 refills | Status: DC

## 2020-06-16 MED ORDER — ACETAMINOPHEN 325 MG PO TABS: 650.0000 mg | ORAL_TABLET | Freq: Once | ORAL | Status: DC | PRN

## 2020-06-16 MED ORDER — AZITHROMYCIN 250 MG PO TABS: 250.0000 mg | ORAL_TABLET | Freq: Every day | ORAL | 0 refills | Status: DC

## 2020-06-16 NOTE — ED Provider Notes (Signed)
[facility] EMERGENCY DEPARTMENT Provider Note   CSN: [id number] Arrival date & time: 06/16/20  0115     History Chief Complaint  Patient presents with  . URI    Lauren Conway is a 29 y.o. female.  Patient presents to the emergency department with a chief complaint of cough and congestion.  She has history of asthma and is an everyday smoker.  She reports that she had similar symptoms a few weeks ago, improved, now her symptoms have returned.  She denies any fevers or chills.  She has been trying over-the-counter medications without any significant relief.  She states that she has been out of work for the past couple of days because of the symptoms.  She denies any other associated symptoms.  The history is provided by the patient. No language interpreter was used.       Past Medical History:  Diagnosis Date  . Anemia   . Anxiety   . Asthma    uses inhaler, last used 8/22  . Depression   . Eczema   . History of chlamydia    12/2008; 03/09/2009  . History of gonorrhea    12/2008; 09/2010;   . 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 PID (pelvic inflammatory disease)     Patient Active Problem List   Diagnosis Date Noted  . Well woman exam with routine gynecological exam 07/01/2019  . LGSIL on Pap smear of cervix 06/12/2018  . Status post tubal ligation 10/29/2017  . Asthma, chronic, mild intermittent, uncomplicated 08/29/2017  . Eczema 08/29/2017  . Anxiety 06/27/2017  . Maternal varicella, non-immune 05/06/2017  . HSV (herpes simplex virus) anogenital infection 05/01/2017    Past Surgical History:  Procedure Laterality Date  . TUBAL LIGATION N/A 10/29/2017   Procedure: POST PARTUM TUBAL LIGATION;  Surgeon: Conan Bowens, MD;  Location: [facility];  Service: Gynecology;  Laterality: N/A;     OB History    Gravida  6   Para  4   Term  3   Preterm  1   AB  2   Living  3     SAB  2   TAB  0    Ectopic  0   Multiple  0   Live Births  3           Family History  Problem Relation Age of Onset  . Hypertension Mother   . Depression Mother   . Breast cancer Paternal Grandmother   . Diabetes Paternal Grandmother   . Asthma Maternal Grandmother   . Other Neg Hx     Social History   Tobacco Use  . Smoking status: Current Every Day Smoker    Packs/day: 0.00    Years: 0.50    Pack years: 0.00    Types: Cigarettes    Last attempt to quit: 03/16/2017    Years since quitting: 3.2  . Smokeless tobacco: Never Used  Vaping Use  . Vaping Use: Never used  Substance Use Topics  . Alcohol use: Yes    Alcohol/week: 0.0 standard drinks    Comment: occassionally  . Drug use: Not Currently    Types: Marijuana    Comment: no more    Home Medications Prior to Admission medications   Medication Sig Start Date End Date Taking? Authorizing Provider  acetaminophen (TYLENOL) 500 MG tablet Take 1,000 mg by mouth every 6 (six) hours as needed for moderate pain.  [provider]  albuterol (PROVENTIL HFA;VENTOLIN HFA) 108 (90 Base) MCG/ACT inhaler Inhale 1-2 puffs into the lungs every 6 (six) hours as needed for wheezing or shortness of breath. 03/17/18   Hedges, Tinnie Gens, PA-C  albuterol (VENTOLIN HFA) 108 (90 Base) MCG/ACT inhaler Inhale 1-2 puffs into the lungs every 6 (six) hours as needed for wheezing or shortness of breath. 05/14/20   Mannie Stabile, PA-C  azithromycin (ZITHROMAX) 250 MG tablet Take 1 tablet (250 mg total) by mouth daily. Take first 2 tablets together, then 1 every day until finished. 06/16/20   Roxy Horseman, PA-C  benzonatate (TESSALON) 100 MG capsule Take 1 capsule (100 mg total) by mouth every 8 (eight) hours. 05/14/20   Mannie Stabile, PA-C  dicyclomine (BENTYL) 20 MG tablet Take 1 tablet (20 mg total) by mouth 2 (two) times daily. 03/06/20   Jeannie Fend, PA-C  famotidine (PEPCID) 20 MG tablet Take 1 tablet (20 mg total) by mouth 2 (two)  times daily. Patient not taking: Reported on 03/05/2020 03/19/19   Henderly, Britni A, PA-C  ibuprofen (ADVIL,MOTRIN) 800 MG tablet Take 1 tablet (800 mg total) by mouth 3 (three) times daily. Patient not taking: Reported on 03/05/2020 02/28/18   Rolm Bookbinder, CNM  loratadine (CLARITIN) 10 MG tablet Take 1 tablet (10 mg total) by mouth daily. Patient not taking: Reported on 03/05/2020 03/17/18   Hedges, Tinnie Gens, PA-C  metroNIDAZOLE (FLAGYL) 500 MG tablet Take 1 tablet (500 mg total) by mouth 2 (two) times daily. Patient not taking: Reported on 03/05/2020 11/17/19   Brock Bad, MD  metroNIDAZOLE (METROGEL) 0.75 % vaginal gel Place 1 Applicatorful vaginally at bedtime. Apply one applicatorful to vagina at bedtime for 5 days Patient not taking: Reported on 12/13/2019 12/07/19   Brock Bad, MD  omeprazole (PRILOSEC) 20 MG capsule Take 1 capsule (20 mg total) by mouth daily. Patient not taking: Reported on 03/05/2020 01/05/20   Petrucelli, Lelon Mast R, PA-C  ondansetron (ZOFRAN ODT) 4 MG disintegrating tablet Take 1 tablet (4 mg total) by mouth every 8 (eight) hours as needed for nausea or vomiting. 03/06/20   Jeannie Fend, PA-C  ondansetron (ZOFRAN ODT) 4 MG disintegrating tablet Take 1 tablet (4 mg total) by mouth every 8 (eight) hours as needed for nausea or vomiting. 05/14/20   Mannie Stabile, PA-C  predniSONE (DELTASONE) 20 MG tablet Take 2 tablets (40 mg total) by mouth daily. 06/16/20   Roxy Horseman, PA-C  sucralfate (CARAFATE) 1 GM/10ML suspension Take 10 mLs (1 g total) by mouth 4 (four) times daily -  with meals and at bedtime. Patient not taking: Reported on 03/05/2020 01/05/20   Petrucelli, Pleas Koch, PA-C    Allergies    Eggs or egg-derived products, Latex, and Pecan nut (diagnostic)  Review of Systems   Review of Systems  All other systems reviewed and are negative.   Physical Exam Updated Vital Signs BP 121/76 (BP Location: Right Arm)   Pulse 86   Temp 98.6 F  (37 C) (Oral)   Resp 16   LMP 05/30/2020   SpO2 98%   Physical Exam Vitals and nursing note reviewed.  Constitutional:      General: She is not in acute distress.    Appearance: She is well-developed.  HENT:     Head: Normocephalic and atraumatic.  Eyes:     Conjunctiva/sclera: Conjunctivae normal.  Cardiovascular:     Rate and Rhythm: Normal rate and regular rhythm.  Heart sounds: No murmur heard.   Pulmonary:     Effort: Pulmonary effort is normal. No respiratory distress.     Breath sounds: Rhonchi present.  Abdominal:     Palpations: Abdomen is soft.     Tenderness: There is no abdominal tenderness.  Musculoskeletal:     Cervical back: Neck supple.  Skin:    General: Skin is warm and dry.  Neurological:     Mental Status: She is alert and oriented to person, place, and time.  Psychiatric:        Mood and Affect: Mood normal.        Behavior: Behavior normal.     ED Results / Procedures / Treatments   Labs (all labs ordered are listed, but only abnormal results are displayed) Labs Reviewed - No data to display  EKG None  Radiology DG Chest 2 View  Result Date: 06/16/2020 CLINICAL DATA:  Asthma, productive cough for 2 days EXAM: CHEST - 2 VIEW COMPARISON:  03/28/2016 FINDINGS: Frontal and lateral views of the chest demonstrate an unremarkable cardiac silhouette. Interstitial prominence consistent with given history of asthma. No airspace disease, effusion, or pneumothorax. The lungs are normally aerated. IMPRESSION: 1. Findings compatible with asthma.  No acute airspace disease. Electronically Signed   By: Sharlet Salina M.D.   On: 06/16/2020 02:12    Procedures Procedures (including critical care time)  Medications Ordered in ED Medications  acetaminophen (TYLENOL) tablet 650 mg (has no administration in time range)    ED Course  I have reviewed the triage vital signs and the nursing notes.  Pertinent labs & imaging results that were available during  my care of the patient were reviewed by me and considered in my medical decision making (see chart for details).    MDM Rules/Calculators/A&P                          Patient with cough and congestion.  Afebrile here.  She does have some mild rhonchi.  Chest x-ray compatible with asthma.  Overall, she is nontoxic in appearance.  I do not feel that further emergent work-up, consultation, or admission is indicated.  We will try some prednisone and will cover with a Z-Pak. Final Clinical Impression(s) / ED Diagnoses Final diagnoses:  Upper respiratory tract infection, unspecified type    Rx / DC Orders ED Discharge Orders         Ordered    predniSONE (DELTASONE) 20 MG tablet  Daily     Discontinue  Reprint     06/16/20 0527    azithromycin (ZITHROMAX) 250 MG tablet  Daily     Discontinue  Reprint     06/16/20 0527           Roxy Horseman, PA-C 06/16/20 0533    Zadie Rhine, MD 06/17/20 [phone]

## 2020-06-16 NOTE — ED Triage Notes (Signed)
Pt c/o sore throat, productive cough, nasal congestion. Denies fever.

## 2020-06-17 ENCOUNTER — Emergency Department (HOSPITAL_COMMUNITY)
Admission: EM | Admit: 2020-06-17 | Discharge: 2020-06-17 | Disposition: A | Discharge status: Home / Self Care | Payer: BC Managed Care – PPO | Attending: Emergency Medicine | Admitting: Emergency Medicine

## 2020-06-17 ENCOUNTER — Telehealth (HOSPITAL_COMMUNITY): Payer: Self-pay | Admitting: Emergency Medicine

## 2020-06-17 ENCOUNTER — Other Ambulatory Visit: Payer: Self-pay

## 2020-06-17 ENCOUNTER — Emergency Department (HOSPITAL_COMMUNITY): Payer: BC Managed Care – PPO

## 2020-06-17 ENCOUNTER — Encounter (HOSPITAL_COMMUNITY): Payer: Self-pay | Admitting: Emergency Medicine

## 2020-06-17 DIAGNOSIS — Z20822 Contact with and (suspected) exposure to covid-19: Secondary | ICD-10-CM | POA: Diagnosis not present

## 2020-06-17 DIAGNOSIS — Z91040 Latex allergy status: Secondary | ICD-10-CM | POA: Diagnosis not present

## 2020-06-17 DIAGNOSIS — J4541 Moderate persistent asthma with (acute) exacerbation: Secondary | ICD-10-CM | POA: Insufficient documentation

## 2020-06-17 DIAGNOSIS — F17210 Nicotine dependence, cigarettes, uncomplicated: Secondary | ICD-10-CM | POA: Diagnosis not present

## 2020-06-17 DIAGNOSIS — Z79899 Other long term (current) drug therapy: Secondary | ICD-10-CM | POA: Insufficient documentation

## 2020-06-17 DIAGNOSIS — J45901 Unspecified asthma with (acute) exacerbation: Secondary | ICD-10-CM

## 2020-06-17 DIAGNOSIS — R0602 Shortness of breath: Secondary | ICD-10-CM | POA: Diagnosis present

## 2020-06-17 LAB — SARS CORONAVIRUS 2 BY RT PCR (HOSPITAL ORDER, PERFORMED IN ~~LOC~~ HOSPITAL LAB): SARS Coronavirus 2: NEGATIVE

## 2020-06-17 MED ORDER — ALBUTEROL SULFATE HFA 108 (90 BASE) MCG/ACT IN AERS
4.0000 | INHALATION_SPRAY | Freq: Once | RESPIRATORY_TRACT | Status: AC
  Administered 2020-06-17: 4 via RESPIRATORY_TRACT
  Filled 2020-06-17: qty 6.7

## 2020-06-17 MED ORDER — HYDROCODONE-HOMATROPINE 5-1.5 MG/5ML PO SYRP: 5.0000 mL | ORAL_SOLUTION | Freq: Four times a day (QID) | ORAL | 0 refills | Status: DC | PRN

## 2020-06-17 MED ORDER — DEXAMETHASONE SODIUM PHOSPHATE 10 MG/ML IJ SOLN
10.0000 mg | Freq: Once | INTRAMUSCULAR | Status: AC
  Administered 2020-06-17: 10 mg via INTRAMUSCULAR
  Filled 2020-06-17: qty 1

## 2020-06-17 MED ORDER — ALBUTEROL SULFATE (2.5 MG/3ML) 0.083% IN NEBU: 2.5000 mg | INHALATION_SOLUTION | Freq: Four times a day (QID) | RESPIRATORY_TRACT | 12 refills | Status: DC | PRN

## 2020-06-17 NOTE — ED Triage Notes (Signed)
Patient here home with complaints of asthma execration. Reports inhaler use and steroids with no relief. 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

## 2020-06-17 NOTE — ED Provider Notes (Signed)
[facility]   CSN: [id number] Arrival date & time: 06/17/20  1520     History Chief Complaint  Patient presents with  . Asthma    Lauren Conway is a 29 y.o. female.  Patient is a 29 year old female with a history of asthma who presents with wheezing and shortness of breath.  She has recently had some URI symptoms with nasal congestion and postnasal drip with coughing.  Her cough is mostly dry.  She was seen yesterday in the ED and had a chest x-ray which showed no acute infiltrate or effusion.  She has not been running fevers.  She was given a prescription for prednisone and a Z-Pak.  She took her first dose yesterday.  She said that she is still wheezy and feels short of breath.  She feels like she is having asthma exacerbation.  No associated chest pain.  No leg pain or swelling.  No history of DVT.  She is not on exogenous estrogens.  She is status post BTL.  She has been using her albuterol inhaler at home without improvement in symptoms.        Past Medical History:  Diagnosis Date  . Anemia   . Anxiety   . Asthma    uses inhaler, last used 8/22  . Depression   . Eczema   . History of chlamydia    12/2008; 03/09/2009  . History of gonorrhea    12/2008; 09/2010;   . 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 PID (pelvic inflammatory disease)     Patient Active Problem List   Diagnosis Date Noted  . Well woman exam with routine gynecological exam 07/01/2019  . LGSIL on Pap smear of cervix 06/12/2018  . Status post tubal ligation 10/29/2017  . Asthma, chronic, mild intermittent, uncomplicated 08/29/2017  . Eczema 08/29/2017  . Anxiety 06/27/2017  . Maternal varicella, non-immune 05/06/2017  . HSV (herpes simplex virus) anogenital infection 05/01/2017    Past Surgical History:  Procedure Laterality Date  . TUBAL LIGATION N/A 10/29/2017   Procedure: POST PARTUM TUBAL LIGATION;  Surgeon: Conan Bowens, MD;  Location: [facility];  Service: Gynecology;  Laterality: N/A;     OB History    Gravida  6   Para  4   Term  3   Preterm  1   AB  2   Living  3     SAB  2   TAB  0   Ectopic  0   Multiple  0   Live Births  3           Family History  Problem Relation Age of Onset  . Hypertension Mother   . Depression Mother   . Breast cancer Paternal Grandmother   . Diabetes Paternal Grandmother   . Asthma Maternal Grandmother   . Other Neg Hx     Social History   Tobacco Use  . Smoking status: Current Every Day Smoker    Packs/day: 0.00    Years: 0.50    Pack years: 0.00    Types: Cigarettes    Last attempt to quit: 03/16/2017    Years since quitting: 3.2  . Smokeless tobacco: Never Used  Vaping Use  . Vaping Use: Never used  Substance Use Topics  . Alcohol use: Yes    Alcohol/week: 0.0 standard drinks    Comment: occassionally  . Drug use: Not Currently    Types:  Marijuana    Comment: no more    Home Medications Prior to Admission medications   Medication Sig Start Date End Date Taking? Authorizing Provider  acetaminophen (TYLENOL) 500 MG tablet Take 1,000 mg by mouth every 6 (six) hours as needed for moderate pain.     [provider]  albuterol (PROVENTIL HFA;VENTOLIN HFA) 108 (90 Base) MCG/ACT inhaler Inhale 1-2 puffs into the lungs every 6 (six) hours as needed for wheezing or shortness of breath. 03/17/18   Hedges, Tinnie Gens, PA-C  albuterol (PROVENTIL) (2.5 MG/3ML) 0.083% nebulizer solution Take 3 mLs (2.5 mg total) by nebulization every 6 (six) hours as needed for wheezing or shortness of breath. 06/17/20   Rolan Bucco, MD  albuterol (VENTOLIN HFA) 108 (90 Base) MCG/ACT inhaler Inhale 1-2 puffs into the lungs every 6 (six) hours as needed for wheezing or shortness of breath. 05/14/20   Mannie Stabile, PA-C  azithromycin (ZITHROMAX) 250 MG tablet Take 1 tablet (250 mg total) by mouth daily. Take first 2 tablets together, then  1 every day until finished. 06/16/20   Roxy Horseman, PA-C  benzonatate (TESSALON) 100 MG capsule Take 1 capsule (100 mg total) by mouth every 8 (eight) hours. 05/14/20   Mannie Stabile, PA-C  dicyclomine (BENTYL) 20 MG tablet Take 1 tablet (20 mg total) by mouth 2 (two) times daily. 03/06/20   Jeannie Fend, PA-C  famotidine (PEPCID) 20 MG tablet Take 1 tablet (20 mg total) by mouth 2 (two) times daily. Patient not taking: Reported on 03/05/2020 03/19/19   Henderly, Britni A, PA-C  HYDROcodone-homatropine (HYCODAN) 5-1.5 MG/5ML syrup Take 5 mLs by mouth every 6 (six) hours as needed for cough. 06/17/20   Rolan Bucco, MD  ibuprofen (ADVIL,MOTRIN) 800 MG tablet Take 1 tablet (800 mg total) by mouth 3 (three) times daily. Patient not taking: Reported on 03/05/2020 02/28/18   Rolm Bookbinder, CNM  loratadine (CLARITIN) 10 MG tablet Take 1 tablet (10 mg total) by mouth daily. Patient not taking: Reported on 03/05/2020 03/17/18   Hedges, Tinnie Gens, PA-C  metroNIDAZOLE (FLAGYL) 500 MG tablet Take 1 tablet (500 mg total) by mouth 2 (two) times daily. Patient not taking: Reported on 03/05/2020 11/17/19   Brock Bad, MD  metroNIDAZOLE (METROGEL) 0.75 % vaginal gel Place 1 Applicatorful vaginally at bedtime. Apply one applicatorful to vagina at bedtime for 5 days Patient not taking: Reported on 12/13/2019 12/07/19   Brock Bad, MD  omeprazole (PRILOSEC) 20 MG capsule Take 1 capsule (20 mg total) by mouth daily. Patient not taking: Reported on 03/05/2020 01/05/20   Petrucelli, Lelon Mast R, PA-C  ondansetron (ZOFRAN ODT) 4 MG disintegrating tablet Take 1 tablet (4 mg total) by mouth every 8 (eight) hours as needed for nausea or vomiting. 03/06/20   Jeannie Fend, PA-C  ondansetron (ZOFRAN ODT) 4 MG disintegrating tablet Take 1 tablet (4 mg total) by mouth every 8 (eight) hours as needed for nausea or vomiting. 05/14/20   Mannie Stabile, PA-C  predniSONE (DELTASONE) 20 MG tablet Take 2 tablets (40  mg total) by mouth daily. 06/16/20   Roxy Horseman, PA-C  sucralfate (CARAFATE) 1 GM/10ML suspension Take 10 mLs (1 g total) by mouth 4 (four) times daily -  with meals and at bedtime. Patient not taking: Reported on 03/05/2020 01/05/20   Petrucelli, Pleas Koch, PA-C    Allergies    Eggs or egg-derived products, Latex, and Pecan nut (diagnostic)  Review of Systems   Review of Systems  Constitutional: Positive for fatigue. Negative for chills, diaphoresis and fever.  HENT: Positive for congestion, postnasal drip and rhinorrhea. Negative for sneezing, trouble swallowing and voice change.   Eyes: Negative.   Respiratory: Positive for cough, shortness of breath and wheezing. Negative for chest tightness.   Cardiovascular: Negative for chest pain and leg swelling.  Gastrointestinal: Negative for abdominal pain, blood in stool, diarrhea, nausea and vomiting.  Genitourinary: Negative for difficulty urinating, flank pain, frequency and hematuria.  Musculoskeletal: Negative for arthralgias and back pain.  Skin: Negative for rash.  Neurological: Negative for dizziness, speech difficulty, weakness, numbness and headaches.    Physical Exam Updated Vital Signs BP 116/77 (BP Location: Left Arm)   Pulse 95   Temp 98.4 F (36.9 C) (Oral)   Resp 18   Ht 5\' 4"  (1.626 m)   Wt 85.7 kg   LMP 05/30/2020   SpO2 100%   BMI 32.44 kg/m   Physical Exam Constitutional:      Appearance: She is well-developed.  HENT:     Head: Normocephalic and atraumatic.     Nose: Congestion present.  Eyes:     Pupils: Pupils are equal, round, and reactive to light.  Cardiovascular:     Rate and Rhythm: Normal rate and regular rhythm.     Heart sounds: Normal heart sounds.  Pulmonary:     Effort: Pulmonary effort is normal. No respiratory distress.     Breath sounds: Wheezing present. No rales.  Chest:     Chest wall: No tenderness.  Abdominal:     General: Bowel sounds are normal.     Palpations: Abdomen is  soft.     Tenderness: There is no abdominal tenderness. There is no guarding or rebound.  Musculoskeletal:        General: Normal range of motion.     Cervical back: Normal range of motion and neck supple.     Comments: No edema or calf tenderness  Lymphadenopathy:     Cervical: No cervical adenopathy.  Skin:    General: Skin is warm and dry.     Findings: No rash.  Neurological:     Mental Status: She is alert and oriented to person, place, and time.     ED Results / Procedures / Treatments   Labs (all labs ordered are listed, but only abnormal results are displayed) Labs Reviewed  SARS CORONAVIRUS 2 BY RT PCR (HOSPITAL ORDER, [facility]FORMED [facility])    EKG None  Radiology DG Chest 2 View  Result Date: 06/16/2020 CLINICAL DATA:  Asthma, productive cough for 2 days EXAM: CHEST - 2 VIEW COMPARISON:  03/28/2016 FINDINGS: Frontal and lateral views of the chest demonstrate an unremarkable cardiac silhouette. Interstitial prominence consistent with given history of asthma. No airspace disease, effusion, or pneumothorax. The lungs are normally aerated. IMPRESSION: 1. Findings compatible with asthma.  No acute airspace disease. Electronically Signed   By: Sharlet SalinaMichael  Brown M.D.   On: 06/16/2020 02:12    Procedures Procedures (including critical care time)  Medications Ordered in ED Medications  dexamethasone (DECADRON) injection 10 mg (10 mg Intramuscular Given 06/17/20 1718)  albuterol (VENTOLIN HFA) 108 (90 Base) MCG/ACT inhaler 4 puff (4 puffs Inhalation Given 06/17/20 1718)    ED Course  I have reviewed the triage vital signs and the nursing notes.  Pertinent labs & imaging results that were available during my care of the patient were reviewed by me and considered in my medical decision making (see chart  for details).    MDM Rules/Calculators/A&P                          Patient with a history of asthma presents with wheezing and shortness of breath.  She has  recent URI symptoms.  She started prednisone and Zithromax yesterday.  She had some wheezing on exam but no increased work of breathing or hypoxia.  She was given a dose of Decadron and some albuterol in the ED.  She is feeling better after this.  Her lung sounds have improved.  She is able to talk in full sentences without shortness of breath.  She has no associated chest pain.  No symptoms that sound more concerning for PE.  She had a chest x-ray done yesterday which showed no acute abnormality.  She is feeling better in the ED.  She was discharged home in good condition.  She will continue her Zithromax and prednisone starting tomorrow.  She was also given a prescription for some cough medicine and some albuterol solution for her nebulizer at home.  She was encouraged to follow-up with her PCP.  Return precautions were given. Final Clinical Impression(s) / ED Diagnoses Final diagnoses:  Moderate asthma with exacerbation, unspecified whether persistent    Rx / DC Orders ED Discharge Orders         Ordered    HYDROcodone-homatropine (HYCODAN) 5-1.5 MG/5ML syrup  Every 6 hours PRN     Discontinue  Reprint     06/17/20 1840    albuterol (PROVENTIL) (2.5 MG/3ML) 0.083% nebulizer solution  Every 6 hours PRN     Discontinue  Reprint     06/17/20 1840           Rolan Bucco, MD 06/17/20 1850

## 2020-06-18 NOTE — Telephone Encounter (Signed)
Prescriptions sent to another pharmacy due to fact that [facility] is out of both meds.  Cancelled at [facility], sent to [facility]

## 2020-08-12 ENCOUNTER — Other Ambulatory Visit: Payer: Self-pay

## 2020-08-12 ENCOUNTER — Encounter (HOSPITAL_COMMUNITY): Payer: Self-pay | Admitting: Emergency Medicine

## 2020-08-12 ENCOUNTER — Emergency Department (HOSPITAL_COMMUNITY)
Admission: EM | Admit: 2020-08-12 | Discharge: 2020-08-12 | Disposition: A | Discharge status: Home / Self Care | Payer: BC Managed Care – PPO | Attending: Emergency Medicine | Admitting: Emergency Medicine

## 2020-08-12 ENCOUNTER — Emergency Department (HOSPITAL_COMMUNITY): Payer: BC Managed Care – PPO

## 2020-08-12 DIAGNOSIS — F419 Anxiety disorder, unspecified: Secondary | ICD-10-CM | POA: Insufficient documentation

## 2020-08-12 DIAGNOSIS — Z7951 Long term (current) use of inhaled steroids: Secondary | ICD-10-CM | POA: Diagnosis not present

## 2020-08-12 DIAGNOSIS — F17210 Nicotine dependence, cigarettes, uncomplicated: Secondary | ICD-10-CM | POA: Insufficient documentation

## 2020-08-12 DIAGNOSIS — R0789 Other chest pain: Secondary | ICD-10-CM | POA: Diagnosis not present

## 2020-08-12 DIAGNOSIS — Z91040 Latex allergy status: Secondary | ICD-10-CM | POA: Diagnosis not present

## 2020-08-12 DIAGNOSIS — J45909 Unspecified asthma, uncomplicated: Secondary | ICD-10-CM | POA: Insufficient documentation

## 2020-08-12 DIAGNOSIS — R0602 Shortness of breath: Secondary | ICD-10-CM | POA: Diagnosis present

## 2020-08-12 DIAGNOSIS — Z79899 Other long term (current) drug therapy: Secondary | ICD-10-CM | POA: Insufficient documentation

## 2020-08-12 LAB — BASIC METABOLIC PANEL
Anion gap: 10 (ref 5–15)
BUN: 11 mg/dL (ref 6–20)
CO2: 24 mmol/L (ref 22–32)
Calcium: 9.5 mg/dL (ref 8.9–10.3)
Chloride: 104 mmol/L (ref 98–111)
Creatinine, Ser: 0.71 mg/dL (ref 0.44–1.00)
GFR calc Af Amer: >60 mL/min (ref >60)
GFR calc non Af Amer: >60 mL/min (ref >60)
Glucose, Bld: 96 mg/dL (ref 70–99)
Potassium: 4.2 mmol/L (ref 3.5–5.1)
Sodium: 138 mmol/L (ref 135–145)

## 2020-08-12 LAB — CBC
HCT: 38.3 % (ref 36.0–46.0)
Hemoglobin: 12.4 g/dL (ref 12.0–15.0)
MCH: 30.9 pg (ref 26.0–34.0)
MCHC: 32.4 g/dL (ref 30.0–36.0)
MCV: 95.5 fL (ref 80.0–100.0)
Platelets: 305 10*3/uL (ref 150–400)
RBC: 4.01 MIL/uL (ref 3.87–5.11)
RDW: 12.7 % (ref 11.5–15.5)
WBC: 9.2 10*3/uL (ref 4.0–10.5)
nRBC: 0 % (ref 0.0–0.2)

## 2020-08-12 LAB — TROPONIN I (HIGH SENSITIVITY)
Troponin I (High Sensitivity): <2 ng/L (ref <18)
Troponin I (High Sensitivity): <2 ng/L (ref <18)

## 2020-08-12 LAB — I-STAT BETA HCG BLOOD, ED (MC, WL, AP ONLY): I-stat hCG, quantitative: <5 m[IU]/mL (ref <5)

## 2020-08-12 MED ORDER — HYDROXYZINE HCL 25 MG PO TABS: 25.0000 mg | ORAL_TABLET | Freq: Four times a day (QID) | ORAL | 0 refills | Status: DC

## 2020-08-12 MED ORDER — HYDROXYZINE HCL 10 MG PO TABS
10.0000 mg | ORAL_TABLET | Freq: Once | ORAL | Status: AC
  Administered 2020-08-12: 10 mg via ORAL
  Filled 2020-08-12: qty 1

## 2020-08-12 NOTE — ED Triage Notes (Signed)
Patient states she had a panic attack and began having chest tightness and shortness of breath that has resolved. Patient states that she has a history of anxiety and is on 10 mg Lexapro which she states is not helping enough.

## 2020-08-12 NOTE — ED Provider Notes (Signed)
[facility]   CSN: [id number] Arrival date & time: 08/12/20  0450     History Chief Complaint  Patient presents with  . Anxiety    Lauren Conway is a 29 y.o. female with history significant for anxiety, anemia, asthma who presents for evaluation of chest pain and shortness of breath.  Patient states she was at work and developed chest tightness, shortness of breath.  Patient states she felt anxious, lightheaded and tremulous.  She is on 10 mg of Lexapro.  She feels like her dose needs to be increased.  She is followed by psychiatry.  Has never had a panic attack previously.  No unilateral leg swelling, redness or warmth.  No recent Covid exposures.  Denies headache, lightheadedness, dizziness, hemoptysis abdominal pain, diarrhea, dysuria, rashes, lesions.  No prior history of PE or DVT.  Denies any current chest pain, shortness of breath.  Symptoms resolved prior to arrival.  States she was told since incident occurred at work she needs to be evaluated prior to returning.  History obtained from patient and past medical records.  No interpreter is used.  HPI     Past Medical History:  Diagnosis Date  . Anemia   . Anxiety   . Asthma    uses inhaler, last used 8/22  . Depression   . Eczema   . History of chlamydia    12/2008; 03/09/2009  . History of gonorrhea    12/2008; 09/2010;   . 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 PID (pelvic inflammatory disease)     Patient Active Problem List   Diagnosis Date Noted  . Well woman exam with routine gynecological exam 07/01/2019  . LGSIL on Pap smear of cervix 06/12/2018  . Status post tubal ligation 10/29/2017  . Asthma, chronic, mild intermittent, uncomplicated 08/29/2017  . Eczema 08/29/2017  . Anxiety 06/27/2017  . Maternal varicella, non-immune 05/06/2017  . HSV (herpes simplex virus) anogenital infection 05/01/2017    Past Surgical History:    Procedure Laterality Date  . TUBAL LIGATION N/A 10/29/2017   Procedure: POST PARTUM TUBAL LIGATION;  Surgeon: Conan Bowens, MD;  Location: [facility] BIRTHING SUITES;  Service: Gynecology;  Laterality: N/A;     OB History    Gravida  6   Para  4   Term  3   Preterm  1   AB  2   Living  3     SAB  2   TAB  0   Ectopic  0   Multiple  0   Live Births  3           Family History  Problem Relation Age of Onset  . Hypertension Mother   . Depression Mother   . Breast cancer Paternal Grandmother   . Diabetes Paternal Grandmother   . Asthma Maternal Grandmother   . Other Neg Hx     Social History   Tobacco Use  . Smoking status: Current Every Day Smoker    Packs/day: 0.00    Years: 0.50    Pack years: 0.00    Types: Cigarettes    Last attempt to quit: 03/16/2017    Years since quitting: 3.4  . Smokeless tobacco: Never Used  Vaping Use  . Vaping Use: Never used  Substance Use Topics  . Alcohol use: Yes    Alcohol/week: 0.0 standard drinks    Comment: occassionally  . Drug use: Not Currently  Types: Marijuana    Comment: no more    Home Medications Prior to Admission medications   Medication Sig Start Date End Date Taking? Authorizing Provider  acetaminophen (TYLENOL) 500 MG tablet Take 1,000 mg by mouth every 6 (six) hours as needed for moderate pain.     [provider]  albuterol (PROVENTIL HFA;VENTOLIN HFA) 108 (90 Base) MCG/ACT inhaler Inhale 1-2 puffs into the lungs every 6 (six) hours as needed for wheezing or shortness of breath. 03/17/18   Hedges, Tinnie Gens, PA-C  albuterol (PROVENTIL) (2.5 MG/3ML) 0.083% nebulizer solution Take 3 mLs (2.5 mg total) by nebulization every 6 (six) hours as needed for wheezing or shortness of breath. 06/17/20   Rolan Bucco, MD  albuterol (VENTOLIN HFA) 108 (90 Base) MCG/ACT inhaler Inhale 1-2 puffs into the lungs every 6 (six) hours as needed for wheezing or shortness of breath. 05/14/20   Mannie Stabile, PA-C   azithromycin (ZITHROMAX) 250 MG tablet Take 1 tablet (250 mg total) by mouth daily. Take first 2 tablets together, then 1 every day until finished. 06/16/20   Roxy Horseman, PA-C  benzonatate (TESSALON) 100 MG capsule Take 1 capsule (100 mg total) by mouth every 8 (eight) hours. 05/14/20   Mannie Stabile, PA-C  dicyclomine (BENTYL) 20 MG tablet Take 1 tablet (20 mg total) by mouth 2 (two) times daily. 03/06/20   Jeannie Fend, PA-C  famotidine (PEPCID) 20 MG tablet Take 1 tablet (20 mg total) by mouth 2 (two) times daily. Patient not taking: Reported on 03/05/2020 03/19/19   Yulisa Chirico A, PA-C  HYDROcodone-homatropine (HYCODAN) 5-1.5 MG/5ML syrup Take 5 mLs by mouth every 6 (six) hours as needed for cough. 06/17/20   Rolan Bucco, MD  hydrOXYzine (ATARAX/VISTARIL) 25 MG tablet Take 1 tablet (25 mg total) by mouth every 6 (six) hours. 08/12/20   Jamiah Homeyer A, PA-C  ibuprofen (ADVIL,MOTRIN) 800 MG tablet Take 1 tablet (800 mg total) by mouth 3 (three) times daily. Patient not taking: Reported on 03/05/2020 02/28/18   Rolm Bookbinder, CNM  loratadine (CLARITIN) 10 MG tablet Take 1 tablet (10 mg total) by mouth daily. Patient not taking: Reported on 03/05/2020 03/17/18   Hedges, Tinnie Gens, PA-C  metroNIDAZOLE (FLAGYL) 500 MG tablet Take 1 tablet (500 mg total) by mouth 2 (two) times daily. Patient not taking: Reported on 03/05/2020 11/17/19   Brock Bad, MD  metroNIDAZOLE (METROGEL) 0.75 % vaginal gel Place 1 Applicatorful vaginally at bedtime. Apply one applicatorful to vagina at bedtime for 5 days Patient not taking: Reported on 12/13/2019 12/07/19   Brock Bad, MD  omeprazole (PRILOSEC) 20 MG capsule Take 1 capsule (20 mg total) by mouth daily. Patient not taking: Reported on 03/05/2020 01/05/20   Petrucelli, Lelon Mast R, PA-C  ondansetron (ZOFRAN ODT) 4 MG disintegrating tablet Take 1 tablet (4 mg total) by mouth every 8 (eight) hours as needed for nausea or vomiting. 03/06/20    Jeannie Fend, PA-C  ondansetron (ZOFRAN ODT) 4 MG disintegrating tablet Take 1 tablet (4 mg total) by mouth every 8 (eight) hours as needed for nausea or vomiting. 05/14/20   Mannie Stabile, PA-C  predniSONE (DELTASONE) 20 MG tablet Take 2 tablets (40 mg total) by mouth daily. 06/16/20   Roxy Horseman, PA-C  sucralfate (CARAFATE) 1 GM/10ML suspension Take 10 mLs (1 g total) by mouth 4 (four) times daily -  with meals and at bedtime. Patient not taking: Reported on 03/05/2020 01/05/20   Petrucelli, Pleas Koch, PA-C  Allergies    Eggs or egg-derived products, Latex, and Pecan nut (diagnostic)  Review of Systems   Review of Systems  Constitutional: Negative.   HENT: Negative.   Respiratory: Positive for chest tightness and shortness of breath. Negative for apnea, cough, choking, wheezing and stridor.   Cardiovascular: Positive for chest pain. Negative for palpitations and leg swelling.  Gastrointestinal: Negative.   Genitourinary: Negative.   Musculoskeletal: Negative.   Skin: Negative.   Neurological: Negative.   Psychiatric/Behavioral: The patient is nervous/anxious.   All other systems reviewed and are negative.   Physical Exam Updated Vital Signs BP 125/75   Pulse 89   Temp 98.2 F (36.8 C)   Resp (!) 21   Ht 5\' 4"  (1.626 m)   Wt 85.7 kg   LMP 07/22/2020   SpO2 100%   BMI 32.44 kg/m   Physical Exam Vitals and nursing note reviewed.  Constitutional:      General: She is not in acute distress.    Appearance: She is not ill-appearing, toxic-appearing or diaphoretic.  HENT:     Head: Normocephalic and atraumatic.     Jaw: There is normal jaw occlusion.     Nose: Nose normal.     Mouth/Throat:     Mouth: Mucous membranes are dry.  Neck:     Vascular: No carotid bruit or JVD.     Trachea: Trachea and phonation normal.  Cardiovascular:     Rate and Rhythm: Normal rate.     Pulses: Normal pulses.          Radial pulses are 2+ on the right side and 2+ on the  left side.       Posterior tibial pulses are 2+ on the right side and 2+ on the left side.     Heart sounds: Normal heart sounds.  Pulmonary:     Effort: Pulmonary effort is normal.     Breath sounds: Normal breath sounds and air entry.  Abdominal:     General: Bowel sounds are normal.     Tenderness: There is no abdominal tenderness. There is no right CVA tenderness, left CVA tenderness, guarding or rebound.  Musculoskeletal:        General: Normal range of motion.     Cervical back: Full passive range of motion without pain, normal range of motion and neck supple.  Skin:    General: Skin is warm.     Capillary Refill: Capillary refill takes 2 to 3 seconds.  Neurological:     General: No focal deficit present.     Mental Status: She is alert and oriented to person, place, and time.    ED Results / Procedures / Treatments   Labs (all labs ordered are listed, but only abnormal results are displayed) Labs Reviewed  BASIC METABOLIC PANEL  CBC  I-STAT BETA HCG BLOOD, ED ([facility], [facility], [facility] ONLY)  TROPONIN I (HIGH SENSITIVITY)  TROPONIN I (HIGH SENSITIVITY)    EKG None  Radiology DG Chest 2 View  Result Date: 08/12/2020 CLINICAL DATA:  Initial evaluation for acute chest pain. EXAM: CHEST - 2 VIEW COMPARISON:  Prior radiograph from 06/16/2020. FINDINGS: The cardiac and mediastinal silhouettes are stable in size and contour, and remain within normal limits. The lungs are normally inflated. No airspace consolidation, pleural effusion, or pulmonary edema. No pneumothorax. No acute osseous abnormality. IMPRESSION: No active cardiopulmonary disease. Electronically Signed   By: Rise MuBenjamin  McClintock M.D.   On: 08/12/2020 07:33   Procedures Procedures (including critical  care time)  Medications Ordered in ED Medications  hydrOXYzine (ATARAX/VISTARIL) tablet 10 mg (10 mg Oral Given 08/12/20 8676)   ED Course  I have reviewed the triage vital signs and the nursing notes.  Pertinent labs &  imaging results that were available during my care of the patient were reviewed by me and considered in my medical decision making (see chart for details).  29 year old female presents for evaluation of chest tightness, shortness of breath which occurred last night while at work.  Symptoms resolved prior to arrival.  No unilateral leg swelling, redness or warmth.  No prior history of PE or DVT.  She is currently on 10 mg Lexapro for anxiety and followed by psychiatry.  Denies SI, HI, AVH.  Patient without exertional or pleuritic chest pain.  No radiation to left arm, left jaw or back.  Patient PERC negative, Wells criteria low risk.  Labs obtained from triage.  Labs and imaging personally reviewed and interpreted: Plain film chest without infiltrates, cardiomegaly, pulmonary edema, pneumothorax Pregnancy test negative CBC without leukocytosis Metabolic panel without electrolyte, renal or normality Delta troponin flat <2 EKG early repol, No STEMI, delta trop flat  Patient reassessed.  Has been in the emergency department greater than 5 hours and has not had symptoms for approximately 12 hours.  Likely anxiety related.  I low suspicion for ACS, PE, dissection, pneumothorax, bacterial infectious process, Covid.  Discussed following up with her psychiatrist.  She will return for any worsening symptoms.  Patient is to be discharged with recommendation to follow up with PCP in regards to today's hospital visit. Chest pain is not likely of cardiac or pulmonary etiology d/t presentation, PERC negative, VSS, no tracheal deviation, no JVD or new murmur, RRR, breath sounds equal bilaterally, EKG without acute abnormalities, negative troponin, and negative CXR. Pt has been advised to return to the ED if CP becomes exertional, associated with diaphoresis or nausea, radiates to left jaw/arm, worsens or becomes concerning in any way. Pt appears reliable for follow up and is agreeable to discharge.   The patient has  been appropriately medically screened and/or stabilized in the ED. I have low suspicion for any other emergent medical condition which would require further screening, evaluation or treatment in the ED or require inpatient management.  Patient is hemodynamically stable and in no acute distress.  Patient able to ambulate in department prior to ED.  Evaluation does not show acute pathology that would require ongoing or additional emergent interventions while in the emergency department or further inpatient treatment.  I have discussed the diagnosis with the patient and answered all questions.  Pain is been managed while in the emergency department and patient has no further complaints prior to discharge.  Patient is comfortable with plan discussed in room and is stable for discharge at this time.  I have discussed strict return precautions for returning to the emergency department.  Patient was encouraged to follow-up with PCP/specialist refer to at discharge.   MDM Rules/Calculators/A&P                          Azusena Erlandson was evaluated in Emergency Department on 08/12/2020 for the symptoms described in the history of present illness. She was evaluated in the context of the global COVID-19 pandemic, which necessitated consideration that the patient might be at risk for infection with the SARS-CoV-2 virus that causes COVID-19. Institutional protocols and algorithms that pertain to the evaluation of patients at risk  for COVID-19 are in a state of rapid change based on information released by regulatory bodies including the CDC and federal and state organizations. These policies and algorithms were followed during the patient's care in the ED. Final Clinical Impression(s) / ED Diagnoses Final diagnoses:  Anxiety  Chest tightness    Rx / DC Orders ED Discharge Orders         Ordered    hydrOXYzine (ATARAX/VISTARIL) 25 MG tablet  Every 6 hours        08/12/20 1036           Oluwatobiloba Martin A,  PA-C 08/12/20 1037    Alvira Monday, MD 08/14/20 1036

## 2020-08-12 NOTE — ED Notes (Signed)
She is calm and in no distress.

## 2020-08-12 NOTE — Discharge Instructions (Signed)
Follow-up with your primary care provider  Return for new or worsening symptoms 

## 2020-08-23 ENCOUNTER — Telehealth: Payer: Self-pay

## 2020-08-23 ENCOUNTER — Other Ambulatory Visit: Payer: Self-pay

## 2020-08-23 DIAGNOSIS — B009 Herpesviral infection, unspecified: Secondary | ICD-10-CM

## 2020-08-23 DIAGNOSIS — N898 Other specified noninflammatory disorders of vagina: Secondary | ICD-10-CM

## 2020-08-23 MED ORDER — METRONIDAZOLE 0.75 % VA GEL: 1.0000 | Freq: Every day | VAGINAL | 0 refills | Status: DC

## 2020-08-23 MED ORDER — VALACYCLOVIR HCL 500 MG PO TABS: ORAL_TABLET | ORAL | 99 refills | Status: DC

## 2020-08-23 NOTE — Telephone Encounter (Signed)
Return call to pt regarding Rx request no answer Pt was also requesting Appt for STD screening will need to speak with appts. I sent Metrogel and Valtrex as pt requested and protocol  Also consulted w/ Dr.Harper pt may have PRN refills on Valtrex .  I left detailed info. confirming Rx was sent and for pt to speak to appts if she needs STD Self swab appt.

## 2020-08-23 NOTE — Progress Notes (Signed)
Rx sent per protocol for Metrogel and Valtrex Consulted with Dr.Harper for Rx management.

## 2020-09-11 ENCOUNTER — Ambulatory Visit: Payer: BC Managed Care – PPO | Admitting: Obstetrics

## 2020-09-25 ENCOUNTER — Encounter: Payer: Self-pay | Admitting: Obstetrics

## 2020-09-25 ENCOUNTER — Other Ambulatory Visit: Payer: Self-pay

## 2020-09-25 ENCOUNTER — Other Ambulatory Visit (HOSPITAL_COMMUNITY)
Admission: RE | Admit: 2020-09-25 | Discharge: 2020-09-25 | Disposition: A | Discharge status: Home / Self Care | Payer: BC Managed Care – PPO | Source: Ambulatory Visit | Attending: Obstetrics | Admitting: Obstetrics

## 2020-09-25 ENCOUNTER — Ambulatory Visit (INDEPENDENT_AMBULATORY_CARE_PROVIDER_SITE_OTHER): Payer: BC Managed Care – PPO | Admitting: Obstetrics

## 2020-09-25 ENCOUNTER — Ambulatory Visit: Payer: BC Managed Care – PPO | Admitting: Obstetrics

## 2020-09-25 VITALS — BP 120/78 | HR 75 | Ht 64.0 in | Wt 193.7 lb

## 2020-09-25 DIAGNOSIS — N760 Acute vaginitis: Secondary | ICD-10-CM | POA: Diagnosis not present

## 2020-09-25 DIAGNOSIS — B373 Candidiasis of vulva and vagina: Secondary | ICD-10-CM | POA: Diagnosis not present

## 2020-09-25 DIAGNOSIS — N898 Other specified noninflammatory disorders of vagina: Secondary | ICD-10-CM | POA: Diagnosis not present

## 2020-09-25 DIAGNOSIS — Z01419 Encounter for gynecological examination (general) (routine) without abnormal findings: Secondary | ICD-10-CM | POA: Insufficient documentation

## 2020-09-25 DIAGNOSIS — B9689 Other specified bacterial agents as the cause of diseases classified elsewhere: Secondary | ICD-10-CM

## 2020-09-25 DIAGNOSIS — B3731 Acute candidiasis of vulva and vagina: Secondary | ICD-10-CM

## 2020-09-25 DIAGNOSIS — F17200 Nicotine dependence, unspecified, uncomplicated: Secondary | ICD-10-CM

## 2020-09-25 MED ORDER — METRONIDAZOLE 0.75 % VA GEL: 1.0000 | Freq: Two times a day (BID) | VAGINAL | 6 refills | Status: DC

## 2020-09-25 MED ORDER — FLUCONAZOLE 150 MG PO TABS: 150.0000 mg | ORAL_TABLET | Freq: Once | ORAL | 4 refills | Status: AC

## 2020-09-25 NOTE — Progress Notes (Signed)
Subjective:        Lauren Conway is a 29 y.o. female here for a routine exam.  Current complaints: Malodorous vaginal discharge.    Personal health questionnaire:  Is patient Ashkenazi Jewish, have a family history of breast and/or ovarian cancer: no Is there a family history of uterine cancer diagnosed at age < 24, gastrointestinal cancer, urinary tract cancer, family member who is a Personnel officer syndrome-associated carrier: no Is the patient overweight and hypertensive, family history of diabetes, personal history of gestational diabetes, preeclampsia or PCOS: no Is patient over 71, have PCOS,  family history of premature CHD under age 44, diabetes, smoke, have hypertension or peripheral artery disease:  no At any time, has a partner hit, kicked or otherwise hurt or frightened you?: no Over the past 2 weeks, have you felt down, depressed or hopeless?: no Over the past 2 weeks, have you felt little interest or pleasure in doing things?:no   Gynecologic History Patient's last menstrual period was 09/13/2020. Contraception: tubal ligation Last Pap: 07-01-2019. Results were: normal Last mammogram: n/a. Results were: n/a  Obstetric History OB History  Gravida Para Term Preterm AB Living  6 4 3 1 2 3   SAB TAB Ectopic Multiple Live Births  2 0 0 0 3    # Outcome Date GA Lbr Len/2nd Weight Sex Delivery Anes PTL Lv  6 Term 10/28/17 [redacted]w[redacted]d   F Vag-Spont   LIV  5 SAB 01/2017 [redacted]w[redacted]d            Birth Comments: no complication  4 Term 08/09/15 [redacted]w[redacted]d 51:15 / 00:18 7 lb 11.6 oz (3.504 kg) M Vag-Spont EPI  LIV  3 Term 03/19/13 [redacted]w[redacted]d 14:40 / 00:34 6 lb 5.6 oz (2.88 kg) F Vag-Spont EPI  LIV     Birth Comments: umbilical hernia  2 SAB 2013 [redacted]w[redacted]d         1 Preterm             Past Medical History:  Diagnosis Date  . Anemia   . Anxiety   . Asthma    uses inhaler, last used 8/22  . Depression   . Eczema   . History of chlamydia    12/2008; 03/09/2009  . History of gonorrhea    12/2008;  09/2010;   . 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 PID (pelvic inflammatory disease)     Past Surgical History:  Procedure Laterality Date  . TUBAL LIGATION N/A 10/29/2017   Procedure: POST PARTUM TUBAL LIGATION;  Surgeon: 10/31/2017, MD;  Location: [facility] BIRTHING SUIT[facility];  Service: Gynecology;  Laterality: N/A;     Current Outpatient Medications:  .  acetaminophen (TYLENOL) 500 MG tablet, Take 1,000 mg by mouth every 6 (six) hours as needed for moderate pain. , Disp: , Rfl:  .  valACYclovir (VALTREX) 500 MG tablet, TAKE 1 TABLET (500 MG) BY MOUTH EVERY DAY TAKE TWICE A DAY FOR 3 DAYS FOR FLARE, Disp: 36 tablet, Rfl: PRN .  albuterol (PROVENTIL HFA;VENTOLIN HFA) 108 (90 Base) MCG/ACT inhaler, Inhale 1-2 puffs into the lungs every 6 (six) hours as needed for wheezing or shortness of breath., Disp: 1 Inhaler, Rfl: 0 .  albuterol (PROVENTIL) (2.5 MG/3ML) 0.083% nebulizer solution, Take 3 mLs (2.5 mg total) by nebulization every 6 (six) hours as needed for wheezing or shortness of breath., Disp: 75 mL, Rfl: 12 .  albuterol (VENTOLIN HFA) 108 (90 Base) MCG/ACT inhaler, Inhale 1-2 puffs into the  lungs every 6 (six) hours as needed for wheezing or shortness of breath., Disp: 18 g, Rfl: 0 .  azithromycin (ZITHROMAX) 250 MG tablet, Take 1 tablet (250 mg total) by mouth daily. Take first 2 tablets together, then 1 every day until finished., Disp: 6 tablet, Rfl: 0 .  benzonatate (TESSALON) 100 MG capsule, Take 1 capsule (100 mg total) by mouth every 8 (eight) hours., Disp: 21 capsule, Rfl: 0 .  dicyclomine (BENTYL) 20 MG tablet, Take 1 tablet (20 mg total) by mouth 2 (two) times daily., Disp: 10 tablet, Rfl: 0 .  famotidine (PEPCID) 20 MG tablet, Take 1 tablet (20 mg total) by mouth 2 (two) times daily. (Patient not taking: Reported on 03/05/2020), Disp: 30 tablet, Rfl: 0 .  fluconazole (DIFLUCAN) 150 MG tablet, Take 1 tablet (150 mg total) by mouth once for 1 dose., Disp: 1 tablet,  Rfl: 4 .  HYDROcodone-homatropine (HYCODAN) 5-1.5 MG/5ML syrup, Take 5 mLs by mouth every 6 (six) hours as needed for cough., Disp: 120 mL, Rfl: 0 .  hydrOXYzine (ATARAX/VISTARIL) 25 MG tablet, Take 1 tablet (25 mg total) by mouth every 6 (six) hours., Disp: 12 tablet, Rfl: 0 .  ibuprofen (ADVIL,MOTRIN) 800 MG tablet, Take 1 tablet (800 mg total) by mouth 3 (three) times daily. (Patient not taking: Reported on 03/05/2020), Disp: 30 tablet, Rfl: 0 .  loratadine (CLARITIN) 10 MG tablet, Take 1 tablet (10 mg total) by mouth daily. (Patient not taking: Reported on 03/05/2020), Disp: 30 tablet, Rfl: 0 .  metroNIDAZOLE (FLAGYL) 500 MG tablet, Take 1 tablet (500 mg total) by mouth 2 (two) times daily. (Patient not taking: Reported on 03/05/2020), Disp: 14 tablet, Rfl: 2 .  metroNIDAZOLE (METROGEL) 0.75 % vaginal gel, Place 1 Applicatorful vaginally at bedtime. Apply one applicatorful to vagina at bedtime for 5 days, Disp: 70 g, Rfl: 0 .  metroNIDAZOLE (METROGEL) 0.75 % vaginal gel, Place 1 Applicatorful vaginally 2 (two) times daily. Apply one applicatorful to vagina at bedtime for 5 days, Disp: 70 g, Rfl: 6 .  omeprazole (PRILOSEC) 20 MG capsule, Take 1 capsule (20 mg total) by mouth daily. (Patient not taking: Reported on 03/05/2020), Disp: 30 capsule, Rfl: 0 .  ondansetron (ZOFRAN ODT) 4 MG disintegrating tablet, Take 1 tablet (4 mg total) by mouth every 8 (eight) hours as needed for nausea or vomiting., Disp: 12 tablet, Rfl: 0 .  ondansetron (ZOFRAN ODT) 4 MG disintegrating tablet, Take 1 tablet (4 mg total) by mouth every 8 (eight) hours as needed for nausea or vomiting., Disp: 20 tablet, Rfl: 0 .  predniSONE (DELTASONE) 20 MG tablet, Take 2 tablets (40 mg total) by mouth daily., Disp: 10 tablet, Rfl: 0 .  sucralfate (CARAFATE) 1 GM/10ML suspension, Take 10 mLs (1 g total) by mouth 4 (four) times daily -  with meals and at bedtime. (Patient not taking: Reported on 03/05/2020), Disp: 420 mL, Rfl: 0 Allergies   Allergen Reactions  . Eggs Or Egg-Derived Products Hives and Swelling  . Latex Itching and Swelling  . Pecan Nut (Diagnostic) Hives    Social History   Tobacco Use  . Smoking status: Current Every Day Smoker    Packs/day: 0.00    Years: 0.50    Pack years: 0.00    Types: Cigarettes    Last attempt to quit: 03/16/2017    Years since quitting: 3.5  . Smokeless tobacco: Never Used  Substance Use Topics  . Alcohol use: Yes    Alcohol/week: 0.0 standard drinks  Comment: occassionally    Family History  Problem Relation Age of Onset  . Hypertension Mother   . Depression Mother   . Breast cancer Paternal Grandmother   . Diabetes Paternal Grandmother   . Asthma Maternal Grandmother   . Other Neg Hx       Review of Systems  Constitutional: negative for fatigue and weight loss Respiratory: negative for cough and wheezing Cardiovascular: negative for chest pain, fatigue and palpitations Gastrointestinal: negative for abdominal pain and change in bowel habits Musculoskeletal:negative for myalgias Neurological: negative for gait problems and tremors Behavioral/Psych: negative for abusive relationship, depression Endocrine: negative for temperature intolerance    Genitourinary:negative for abnormal menstrual periods, genital lesions, hot flashes, sexual problems.  Positive for malodorous vaginal discharge Integument/breast: negative for breast lump, breast tenderness, nipple discharge and skin lesion(s)    Objective:       BP 120/78   Pulse 75   Ht 5\' 4"  (1.626 m)   Wt 193 lb 11.2 oz (87.9 kg)   LMP 09/13/2020   BMI 33.25 kg/m  General:   alert and no distress  Skin:   no rash or abnormalities  Lungs:   clear to auscultation bilaterally  Heart:   regular rate and rhythm, S1, S2 normal, no murmur, click, rub or gallop  Breasts:   normal without suspicious masses, skin or nipple changes or axillary nodes  Abdomen:  normal findings: no organomegaly, soft, non-tender and  no hernia  Pelvis:  External genitalia: normal general appearance Urinary system: urethral meatus normal and bladder without fullness, nontender Vaginal: normal without tenderness, induration or masses Cervix: normal appearance Adnexa: normal bimanual exam Uterus: anteverted and non-tender, normal size   Lab Review Urine pregnancy test Labs reviewed yes Radiologic studies reviewed no  50% of 20 min visit spent on counseling and coordination of care.   Assessment:     1. Encounter for routine gynecological examination with Papanicolaou smear of cervix Rx: - Cytology - PAP( [place])  2. Vaginal discharge Rx: - Cervicovaginal ancillary only( [place])  3. BV (bacterial vaginosis) Rx: - metroNIDAZOLE (METROGEL) 0.75 % vaginal gel; Place 1 Applicatorful vaginally 2 (two) times daily. Apply one applicatorful to vagina at bedtime for 5 days  Dispense: 70 g; Refill: 6  4. Candida vaginitis Rx: - fluconazole (DIFLUCAN) 150 MG tablet; Take 1 tablet (150 mg total) by mouth once for 1 dose.  Dispense: 1 tablet; Refill: 4  5. Tobacco dependence - cessation with the aid of medication and behavioral modification recommended   Plan:    Education reviewed: calcium supplements, depression evaluation, low fat, low cholesterol diet, safe sex/STD prevention, self breast exams, smoking cessation and weight bearing exercise. Follow up in: 6 months.     09/15/2020, MD 09/25/2020 9:57 AM

## 2020-09-25 NOTE — Progress Notes (Signed)
Pt is in the office for vaginal irritation, yellowi discharge, odor, and spotting in between cycles. Last pap 07-01-19

## 2020-09-26 ENCOUNTER — Other Ambulatory Visit: Payer: Self-pay | Admitting: Obstetrics

## 2020-09-26 DIAGNOSIS — A5901 Trichomonal vulvovaginitis: Secondary | ICD-10-CM

## 2020-09-26 LAB — CERVICOVAGINAL ANCILLARY ONLY
Bacterial Vaginitis (gardnerella): POSITIVE — AB
Candida Glabrata: NEGATIVE
Candida Vaginitis: NEGATIVE
Chlamydia: NEGATIVE
Comment: NEGATIVE
Comment: NEGATIVE
Comment: NEGATIVE
Comment: NEGATIVE
Comment: NEGATIVE
Comment: NORMAL
Neisseria Gonorrhea: NEGATIVE
Trichomonas: POSITIVE — AB

## 2020-09-26 MED ORDER — METRONIDAZOLE 500 MG PO TABS: 2000.0000 mg | ORAL_TABLET | Freq: Once | ORAL | 0 refills | Status: AC

## 2020-09-27 LAB — CYTOLOGY - PAP: Diagnosis: NEGATIVE

## 2021-02-27 ENCOUNTER — Other Ambulatory Visit (HOSPITAL_COMMUNITY)
Admission: RE | Admit: 2021-02-27 | Discharge: 2021-02-27 | Disposition: A | Discharge status: Home / Self Care | Payer: PRIVATE HEALTH INSURANCE | Source: Ambulatory Visit | Attending: Obstetrics | Admitting: Obstetrics

## 2021-02-27 ENCOUNTER — Ambulatory Visit (INDEPENDENT_AMBULATORY_CARE_PROVIDER_SITE_OTHER): Payer: PRIVATE HEALTH INSURANCE

## 2021-02-27 ENCOUNTER — Other Ambulatory Visit: Payer: Self-pay

## 2021-02-27 VITALS — Wt 194.0 lb

## 2021-02-27 DIAGNOSIS — Z113 Encounter for screening for infections with a predominantly sexual mode of transmission: Secondary | ICD-10-CM | POA: Insufficient documentation

## 2021-02-27 NOTE — Progress Notes (Signed)
Patient was assessed and managed by nursing staff during this encounter. I have reviewed the chart and agree with the documentation and plan. I have also made any necessary editorial changes.  Warden Fillers, MD 02/27/2021 5:17 PM

## 2021-02-27 NOTE — Progress Notes (Signed)
SUBJECTIVE:  30 y.o. female presents for STD Screening, patient stated that she is back with her Ex and wanted to make sure everything is okay . Denies discharge, odor, itching, abnormal vaginal bleeding or significant pelvic pain or fever. No UTI symptoms. Denies history of known exposure to STD.  No LMP recorded.  OBJECTIVE:  She appears well, afebrile. Urine dipstick: not done.  ASSESSMENT:  STD Screening new partner.  PLAN:  GC, chlamydia, trichomonas, BVAG, CVAG probe and blood work sent to lab. Treatment: To be determined once lab results are received ROV prn if symptoms persist or worsen.

## 2021-02-28 LAB — RPR+HBSAG+HCVAB+...
HIV Screen 4th Generation wRfx: NONREACTIVE
Hep C Virus Ab: <0.1 s/co ratio (ref 0.0–0.9)
Hepatitis B Surface Ag: NEGATIVE
RPR Ser Ql: NONREACTIVE

## 2021-02-28 LAB — CERVICOVAGINAL ANCILLARY ONLY
Bacterial Vaginitis (gardnerella): NEGATIVE
Candida Glabrata: NEGATIVE
Candida Vaginitis: NEGATIVE
Chlamydia: NEGATIVE
Comment: NEGATIVE
Comment: NEGATIVE
Comment: NEGATIVE
Comment: NEGATIVE
Comment: NEGATIVE
Comment: NORMAL
Neisseria Gonorrhea: NEGATIVE
Trichomonas: NEGATIVE

## 2021-03-10 ENCOUNTER — Other Ambulatory Visit: Payer: Self-pay

## 2021-03-10 ENCOUNTER — Emergency Department (HOSPITAL_COMMUNITY)
Admission: EM | Admit: 2021-03-10 | Discharge: 2021-03-10 | Disposition: A | Discharge status: Home / Self Care | Payer: PRIVATE HEALTH INSURANCE | Attending: Emergency Medicine | Admitting: Emergency Medicine

## 2021-03-10 ENCOUNTER — Emergency Department (HOSPITAL_COMMUNITY): Payer: PRIVATE HEALTH INSURANCE

## 2021-03-10 DIAGNOSIS — Z91040 Latex allergy status: Secondary | ICD-10-CM | POA: Diagnosis not present

## 2021-03-10 DIAGNOSIS — F17210 Nicotine dependence, cigarettes, uncomplicated: Secondary | ICD-10-CM | POA: Insufficient documentation

## 2021-03-10 DIAGNOSIS — J45909 Unspecified asthma, uncomplicated: Secondary | ICD-10-CM | POA: Insufficient documentation

## 2021-03-10 DIAGNOSIS — S99921A Unspecified injury of right foot, initial encounter: Secondary | ICD-10-CM

## 2021-03-10 DIAGNOSIS — S90111A Contusion of right great toe without damage to nail, initial encounter: Secondary | ICD-10-CM | POA: Diagnosis not present

## 2021-03-10 DIAGNOSIS — X58XXXA Exposure to other specified factors, initial encounter: Secondary | ICD-10-CM | POA: Diagnosis not present

## 2021-03-10 DIAGNOSIS — S90931A Unspecified superficial injury of right great toe, initial encounter: Secondary | ICD-10-CM | POA: Diagnosis present

## 2021-03-10 MED ORDER — ACETAMINOPHEN 325 MG PO TABS
650.0000 mg | ORAL_TABLET | Freq: Once | ORAL | Status: AC
  Administered 2021-03-10: 650 mg via ORAL
  Filled 2021-03-10: qty 2

## 2021-03-10 NOTE — ED Provider Notes (Signed)
[facility] EMERGENCY DEPARTMENT Provider Note   CSN: [id number] Arrival date & time: 03/10/21  6295     History Chief Complaint  Patient presents with  . Toe Injury    Lauren Conway is a 30 y.o. female.  HPI  Patient is a 30 year old female presenting today with right great toe pain.  She states that she stubbed her toe this morning on her way out of work.  She states that she was wearing flip-flops and smashed her toe into a door.  She denies any lacerations or bleeding.  She denies any other injuries including head injury or loss of consciousness.  She states it is aching and constant worse with movement.  She came immediately to the emergency department she took no medications prior to arrival.  No other associated symptoms.  Worse with touch and movement.      Past Medical History:  Diagnosis Date  . Anemia   . Anxiety   . Asthma    uses inhaler, last used 8/22  . Depression   . Eczema   . History of chlamydia    12/2008; 03/09/2009  . History of gonorrhea    12/2008; 09/2010;   . 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 PID (pelvic inflammatory disease)     Patient Active Problem List   Diagnosis Date Noted  . Well woman exam with routine gynecological exam 07/01/2019  . LGSIL on Pap smear of cervix 06/12/2018  . Status post tubal ligation 10/29/2017  . Asthma, chronic, mild intermittent, uncomplicated 08/29/2017  . Eczema 08/29/2017  . Anxiety 06/27/2017  . Maternal varicella, non-immune 05/06/2017  . HSV (herpes simplex virus) anogenital infection 05/01/2017    Past Surgical History:  Procedure Laterality Date  . TUBAL LIGATION N/A 10/29/2017   Procedure: POST PARTUM TUBAL LIGATION;  Surgeon: Conan Bowens, MD;  Location: [facility] BIRTHING SUITES;  Service: Gynecology;  Laterality: N/A;     OB History    Gravida  6   Para  4   Term  3   Preterm  1   AB  2   Living  3     SAB  2   IAB  0   Ectopic  0    Multiple  0   Live Births  3           Family History  Problem Relation Age of Onset  . Hypertension Mother   . Depression Mother   . Breast cancer Paternal Grandmother   . Diabetes Paternal Grandmother   . Asthma Maternal Grandmother   . Other Neg Hx     Social History   Tobacco Use  . Smoking status: Current Every Day Smoker    Packs/day: 0.00    Years: 0.50    Pack years: 0.00    Types: Cigarettes    Last attempt to quit: 03/16/2017    Years since quitting: 3.9  . Smokeless tobacco: Never Used  Vaping Use  . Vaping Use: Never used  Substance Use Topics  . Alcohol use: Yes    Alcohol/week: 0.0 standard drinks    Comment: occassionally  . Drug use: Not Currently    Types: Marijuana    Comment: no more    Home Medications Prior to Admission medications   Medication Sig Start Date End Date Taking? Authorizing Provider  acetaminophen (TYLENOL) 500 MG tablet Take 1,000 mg by mouth every 6 (six) hours as needed for moderate pain.  [provider]  albuterol (PROVENTIL HFA;VENTOLIN HFA) 108 (90 Base) MCG/ACT inhaler Inhale 1-2 puffs into the lungs every 6 (six) hours as needed for wheezing or shortness of breath. 03/17/18   Hedges, Tinnie Gens, PA-C  albuterol (PROVENTIL) (2.5 MG/3ML) 0.083% nebulizer solution Take 3 mLs (2.5 mg total) by nebulization every 6 (six) hours as needed for wheezing or shortness of breath. 06/17/20   Rolan Bucco, MD  albuterol (VENTOLIN HFA) 108 (90 Base) MCG/ACT inhaler Inhale 1-2 puffs into the lungs every 6 (six) hours as needed for wheezing or shortness of breath. 05/14/20   Mannie Stabile, PA-C  azithromycin (ZITHROMAX) 250 MG tablet Take 1 tablet (250 mg total) by mouth daily. Take first 2 tablets together, then 1 every day until finished. 06/16/20   Roxy Horseman, PA-C  benzonatate (TESSALON) 100 MG capsule Take 1 capsule (100 mg total) by mouth every 8 (eight) hours. Patient not taking: Reported on 02/27/2021 05/14/20    Mannie Stabile, PA-C  dicyclomine (BENTYL) 20 MG tablet Take 1 tablet (20 mg total) by mouth 2 (two) times daily. Patient not taking: Reported on 02/27/2021 03/06/20   Army Melia A, PA-C  famotidine (PEPCID) 20 MG tablet Take 1 tablet (20 mg total) by mouth 2 (two) times daily. Patient not taking: Reported on 03/05/2020 03/19/19   Henderly, Britni A, PA-C  HYDROcodone-homatropine (HYCODAN) 5-1.5 MG/5ML syrup Take 5 mLs by mouth every 6 (six) hours as needed for cough. Patient not taking: Reported on 02/27/2021 06/17/20   Rolan Bucco, MD  hydrOXYzine (ATARAX/VISTARIL) 25 MG tablet Take 1 tablet (25 mg total) by mouth every 6 (six) hours. Patient not taking: Reported on 02/27/2021 08/12/20   Henderly, Britni A, PA-C  ibuprofen (ADVIL,MOTRIN) 800 MG tablet Take 1 tablet (800 mg total) by mouth 3 (three) times daily. Patient not taking: Reported on 03/05/2020 02/28/18   Rolm Bookbinder, CNM  loratadine (CLARITIN) 10 MG tablet Take 1 tablet (10 mg total) by mouth daily. Patient not taking: Reported on 03/05/2020 03/17/18   Hedges, Tinnie Gens, PA-C  metroNIDAZOLE (FLAGYL) 500 MG tablet Take 1 tablet (500 mg total) by mouth 2 (two) times daily. Patient not taking: Reported on 03/05/2020 11/17/19   Brock Bad, MD  metroNIDAZOLE (METROGEL) 0.75 % vaginal gel Place 1 Applicatorful vaginally at bedtime. Apply one applicatorful to vagina at bedtime for 5 days 08/23/20   Brock Bad, MD  metroNIDAZOLE (METROGEL) 0.75 % vaginal gel Place 1 Applicatorful vaginally 2 (two) times daily. Apply one applicatorful to vagina at bedtime for 5 days 09/25/20   Brock Bad, MD  omeprazole (PRILOSEC) 20 MG capsule Take 1 capsule (20 mg total) by mouth daily. Patient not taking: Reported on 03/05/2020 01/05/20   Petrucelli, Lelon Mast R, PA-C  ondansetron (ZOFRAN ODT) 4 MG disintegrating tablet Take 1 tablet (4 mg total) by mouth every 8 (eight) hours as needed for nausea or vomiting. 03/06/20   Army Melia A, PA-C   ondansetron (ZOFRAN ODT) 4 MG disintegrating tablet Take 1 tablet (4 mg total) by mouth every 8 (eight) hours as needed for nausea or vomiting. Patient not taking: Reported on 02/27/2021 05/14/20   Mannie Stabile, PA-C  predniSONE (DELTASONE) 20 MG tablet Take 2 tablets (40 mg total) by mouth daily. Patient not taking: Reported on 02/27/2021 06/16/20   Roxy Horseman, PA-C  sucralfate (CARAFATE) 1 GM/10ML suspension Take 10 mLs (1 g total) by mouth 4 (four) times daily -  with meals and at bedtime. Patient not taking:  Reported on 03/05/2020 01/05/20   Petrucelli, Pleas Koch, PA-C  valACYclovir (VALTREX) 500 MG tablet TAKE 1 TABLET (500 MG) BY MOUTH EVERY DAY TAKE TWICE A DAY FOR 3 DAYS FOR FLARE 08/23/20   Brock Bad, MD    Allergies    Eggs or egg-derived products, Latex, and Pecan nut (diagnostic)  Review of Systems   Review of Systems  Constitutional: Negative for fever.  HENT: Negative for congestion.   Cardiovascular: Negative for chest pain.  Gastrointestinal: Negative for abdominal distention.  Musculoskeletal:       Right great toe pain  Neurological: Negative for dizziness and headaches.    Physical Exam Updated Vital Signs BP 132/86 (BP Location: Left Arm)   Pulse 76   Temp 97.9 F (36.6 C) (Oral)   Resp 18   Ht 5\' 4"  (1.626 m)   Wt 88 kg   SpO2 100%   BMI 33.30 kg/m   Physical Exam Vitals and nursing note reviewed.  Constitutional:      General: She is not in acute distress.    Appearance: Normal appearance. She is not ill-appearing.  HENT:     Head: Normocephalic and atraumatic.  Eyes:     General: No scleral icterus.       Right eye: No discharge.        Left eye: No discharge.     Conjunctiva/sclera: Conjunctivae normal.  Pulmonary:     Effort: Pulmonary effort is normal.     Breath sounds: No stridor.  Musculoskeletal:     Comments: Full range of motion of all tenderness.  Right great toe with tenderness to palpation of the distal phalanx.  No  nail disruption laceration or abrasion.  No bruising or deformity.  Skin:    General: Skin is warm and dry.     Capillary Refill: Capillary refill takes less than 2 seconds.  Neurological:     Mental Status: She is alert and oriented to person, place, and time. Mental status is at baseline.     Comments: Sensation intact in all toe tips     ED Results / Procedures / Treatments   Labs (all labs ordered are listed, but only abnormal results are displayed) Labs Reviewed - No data to display  EKG None  Radiology DG Toe Great Right  Result Date: 03/10/2021 CLINICAL DATA:  Stubbed toe.  Concern for fracture EXAM: RIGHT GREAT TOE COMPARISON:  None. FINDINGS: There is no evidence of fracture or dislocation. No opaque foreign body. IMPRESSION: Negative. Electronically Signed   By: 03/12/2021 M.D.   On: 03/10/2021 07:46    Procedures Procedures   Medications Ordered in ED Medications  acetaminophen (TYLENOL) tablet 650 mg (650 mg Oral Given 03/10/21 0735)    ED Course  I have reviewed the triage vital signs and the nursing notes.  Pertinent labs & imaging results that were available during my care of the patient were reviewed by me and considered in my medical decision making (see chart for details).    MDM Rules/Calculators/A&P                          Patient is well-appearing 30 year old female presented today for right great toe pain after stubbing her toe prior to arrival in the ER.  She has taken no medications.  She is distally neurovascularly intact.  I have low suspicion for fracture however she is very concerned for fracture.  Will obtain x-ray to further  evaluate.  Xray negative  Offered post-op shoe and buddy tape.   DC with tylenol and ibuprofen.   Final Clinical Impression(s) / ED Diagnoses Final diagnoses:  Injury of toe on right foot, initial encounter  Contusion of right great toe without damage to nail, initial encounter    Rx / DC Orders ED  Discharge Orders    None       Gailen ShelterFondaw, Francesca Strome S, [place] 03/10/21 [id number]    Vanetta MuldersZackowski, Scott, MD 03/10/21 [phone]

## 2021-03-10 NOTE — ED Triage Notes (Signed)
Pt stubbed her right great toe on her apartment stairs.

## 2021-03-10 NOTE — Discharge Instructions (Addendum)
Your xray was negative for fracture.  Please rest ice and elevate your toe. You may choose to tape your great toe to your right second toe for comfort. Please use Tylenol or ibuprofen for pain.  You may use 600 mg ibuprofen every 6 hours or 1000 mg of Tylenol every 6 hours.  You may choose to alternate between the 2.  This would be most effective.  Not to exceed 4 g of Tylenol within 24 hours.  Not to exceed 3200 mg ibuprofen 24 hours.

## 2021-03-10 NOTE — ED Notes (Signed)
Patient verbalizes understanding of discharge instructions. Opportunity for questioning and answers were provided. Armband removed by staff, pt discharged from ED.  

## 2021-03-10 NOTE — ED Notes (Addendum)
Big toe has been buddy taped

## 2021-04-02 ENCOUNTER — Other Ambulatory Visit: Payer: Self-pay

## 2021-04-02 ENCOUNTER — Emergency Department (HOSPITAL_COMMUNITY)
Admission: EM | Admit: 2021-04-02 | Discharge: 2021-04-02 | Disposition: A | Discharge status: Home / Self Care | Payer: Self-pay | Attending: Emergency Medicine | Admitting: Emergency Medicine

## 2021-04-02 ENCOUNTER — Emergency Department (HOSPITAL_COMMUNITY): Payer: Self-pay

## 2021-04-02 ENCOUNTER — Encounter (HOSPITAL_COMMUNITY): Payer: Self-pay | Admitting: Emergency Medicine

## 2021-04-02 DIAGNOSIS — J4530 Mild persistent asthma, uncomplicated: Secondary | ICD-10-CM | POA: Diagnosis not present

## 2021-04-02 DIAGNOSIS — K529 Noninfective gastroenteritis and colitis, unspecified: Secondary | ICD-10-CM | POA: Diagnosis not present

## 2021-04-02 DIAGNOSIS — K625 Hemorrhage of anus and rectum: Secondary | ICD-10-CM | POA: Diagnosis not present

## 2021-04-02 DIAGNOSIS — B9689 Other specified bacterial agents as the cause of diseases classified elsewhere: Secondary | ICD-10-CM

## 2021-04-02 DIAGNOSIS — N939 Abnormal uterine and vaginal bleeding, unspecified: Secondary | ICD-10-CM | POA: Diagnosis not present

## 2021-04-02 DIAGNOSIS — F17210 Nicotine dependence, cigarettes, uncomplicated: Secondary | ICD-10-CM | POA: Diagnosis not present

## 2021-04-02 DIAGNOSIS — R109 Unspecified abdominal pain: Secondary | ICD-10-CM | POA: Diagnosis present

## 2021-04-02 DIAGNOSIS — Z91040 Latex allergy status: Secondary | ICD-10-CM | POA: Diagnosis not present

## 2021-04-02 LAB — CBC WITH DIFFERENTIAL/PLATELET
Abs Immature Granulocytes: 0.02 10*3/uL (ref 0.00–0.07)
Basophils Absolute: 0.1 10*3/uL (ref 0.0–0.1)
Basophils Relative: 1 %
Eosinophils Absolute: 0.2 10*3/uL (ref 0.0–0.5)
Eosinophils Relative: 2 %
HCT: 36.2 % (ref 36.0–46.0)
Hemoglobin: 12.1 g/dL (ref 12.0–15.0)
Immature Granulocytes: 0 %
Lymphocytes Relative: 35 %
Lymphs Abs: 2.8 10*3/uL (ref 0.7–4.0)
MCH: 31.1 pg (ref 26.0–34.0)
MCHC: 33.4 g/dL (ref 30.0–36.0)
MCV: 93.1 fL (ref 80.0–100.0)
Monocytes Absolute: 0.3 10*3/uL (ref 0.1–1.0)
Monocytes Relative: 4 %
Neutro Abs: 4.5 10*3/uL (ref 1.7–7.7)
Neutrophils Relative %: 58 %
Platelets: 354 10*3/uL (ref 150–400)
RBC: 3.89 MIL/uL (ref 3.87–5.11)
RDW: 12.2 % (ref 11.5–15.5)
WBC: 7.8 10*3/uL (ref 4.0–10.5)
nRBC: 0 % (ref 0.0–0.2)

## 2021-04-02 LAB — URINALYSIS, ROUTINE W REFLEX MICROSCOPIC
Bilirubin Urine: NEGATIVE
Glucose, UA: NEGATIVE mg/dL
Ketones, ur: NEGATIVE mg/dL
Leukocytes,Ua: NEGATIVE
Nitrite: NEGATIVE
Protein, ur: NEGATIVE mg/dL
Specific Gravity, Urine: 1.02 (ref 1.005–1.030)
pH: 6 (ref 5.0–8.0)

## 2021-04-02 LAB — LIPASE, BLOOD: Lipase: 32 U/L (ref 11–51)

## 2021-04-02 LAB — COMPREHENSIVE METABOLIC PANEL
ALT: 12 U/L (ref 0–44)
AST: 19 U/L (ref 15–41)
Albumin: 3.6 g/dL (ref 3.5–5.0)
Alkaline Phosphatase: 81 U/L (ref 38–126)
Anion gap: 6 (ref 5–15)
BUN: 7 mg/dL (ref 6–20)
CO2: 27 mmol/L (ref 22–32)
Calcium: 9 mg/dL (ref 8.9–10.3)
Chloride: 104 mmol/L (ref 98–111)
Creatinine, Ser: 0.78 mg/dL (ref 0.44–1.00)
GFR, Estimated: >60 mL/min (ref >60)
Glucose, Bld: 96 mg/dL (ref 70–99)
Potassium: 3.7 mmol/L (ref 3.5–5.1)
Sodium: 137 mmol/L (ref 135–145)
Total Bilirubin: 0.6 mg/dL (ref 0.3–1.2)
Total Protein: 6.9 g/dL (ref 6.5–8.1)

## 2021-04-02 LAB — I-STAT BETA HCG BLOOD, ED (MC, WL, AP ONLY): I-stat hCG, quantitative: <5 m[IU]/mL (ref <5)

## 2021-04-02 LAB — WET PREP, GENITAL
Sperm: NONE SEEN
Trich, Wet Prep: NONE SEEN
Yeast Wet Prep HPF POC: NONE SEEN

## 2021-04-02 LAB — RAPID HIV SCREEN (HIV 1/2 AB+AG)
HIV 1/2 Antibodies: NONREACTIVE
HIV-1 P24 Antigen - HIV24: NONREACTIVE

## 2021-04-02 MED ORDER — METRONIDAZOLE 500 MG PO TABS
500.0000 mg | ORAL_TABLET | Freq: Once | ORAL | Status: AC
  Administered 2021-04-02: 500 mg via ORAL
  Filled 2021-04-02: qty 1

## 2021-04-02 MED ORDER — IOHEXOL 300 MG/ML  SOLN
100.0000 mL | Freq: Once | INTRAMUSCULAR | Status: AC | PRN
  Administered 2021-04-02: 100 mL via INTRAVENOUS

## 2021-04-02 MED ORDER — METRONIDAZOLE 500 MG PO TABS: 500.0000 mg | ORAL_TABLET | Freq: Two times a day (BID) | ORAL | 0 refills | Status: DC

## 2021-04-02 MED ORDER — KETOROLAC TROMETHAMINE 30 MG/ML IJ SOLN
30.0000 mg | Freq: Once | INTRAMUSCULAR | Status: AC
  Administered 2021-04-02: 30 mg via INTRAVENOUS
  Filled 2021-04-02: qty 1

## 2021-04-02 NOTE — ED Triage Notes (Signed)
Emergency Medicine Provider Triage Evaluation Note  Lauren Conway , a 30 y.o. female  was evaluated in triage.  Pt complains of periumbilical abd pain x2 days and bloody stool.  Review of Systems  Positive: abd pain, bloody stool Negative: fever  Physical Exam  BP (!) 133/92 (BP Location: Left Arm)   Pulse 81   Temp 98.9 F (37.2 C)   Resp 17   SpO2 100%  Gen:   Awake, no distress   HEENT:  Atraumatic  Resp:  Normal effort  Cardiac:  Normal rate  Abd:   Nondistended, nontender  MSK:   Moves extremities without difficulty  Neuro:  Speech clear   Medical Decision Making  Medically screening exam initiated at 1:46 PM.  Appropriate orders placed.  Lauren Conway was informed that the remainder of the evaluation will be completed by another provider, this initial triage assessment does not replace that evaluation, and the importance of remaining in the ED until their evaluation is complete.  Clinical Impression   30 y/o F here with abd pain and hematochezia.   MSE was initiated and I personally evaluated the patient and placed orders (if any) at  1:46 PM on April 02, 2021.  The patient appears stable so that the remainder of the MSE may be completed by another provider.    Lauren Meres, PA-C 04/02/21 1346

## 2021-04-02 NOTE — ED Triage Notes (Signed)
Pt reports generalized abd pain , nausea, vomiting, and diarrhea 2 days ago that has resolved.  States yesterday and today she felt like she had gas and had bright red rectal bleeding.

## 2021-04-02 NOTE — ED Provider Notes (Signed)
[facility]   CSN: [id number] Arrival date & time: 04/02/21  1341     History Chief Complaint  Patient presents with  . Rectal Bleeding    Lauren Conway is a 30 y.o. female.  HPI   30 year old female with past medical history of HSV-1, previous gonorrhea/chlamydia infection with PID presents the emergency department with mid abdominal pain and bloody bowel movement.  Patient states starting 2 days ago she started having mid abdominal crampy/sharp pain.  This resulted in an episode of emesis and diarrhea.  Yesterday before she went to bed she had the sensation that she is can have a bowel movement, when she sat down she had what she thought was just flatulence and looked in the toilet and there was bright red blood all over the toilet.  Patient denies any fever or chills.  When she woke up today she was having ongoing mid abdominal discomfort and again had a bowel movement that was just bright red blood with "stringy clots" in it.  Of note the patient is currently on her menstruation and states that she is unclear if the bleeding is possibly coming from her vagina.  Patient was post to see GI last year when she had similar symptoms.  She was unable to follow-up and now is uninsured.  No independent rectal bleeding besides BM.  Right now she is complaining of the mid abdominal discomfort but states that it is mild, denies any nausea.  Patient also wants to be screened for every STD, she endorses abnormal vaginal odor and discomfort, denies any change in vaginal discharge or she is currently on her menstrual period.  Past Medical History:  Diagnosis Date  . Anemia   . Anxiety   . Asthma    uses inhaler, last used 8/22  . Depression   . Eczema   . History of chlamydia    12/2008; 03/09/2009  . History of gonorrhea    12/2008; 09/2010;   . 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 PID (pelvic inflammatory disease)      Patient Active Problem List   Diagnosis Date Noted  . Well woman exam with routine gynecological exam 07/01/2019  . LGSIL on Pap smear of cervix 06/12/2018  . Status post tubal ligation 10/29/2017  . Asthma, chronic, mild intermittent, uncomplicated 08/29/2017  . Eczema 08/29/2017  . Anxiety 06/27/2017  . Maternal varicella, non-immune 05/06/2017  . HSV (herpes simplex virus) anogenital infection 05/01/2017    Past Surgical History:  Procedure Laterality Date  . TUBAL LIGATION N/A 10/29/2017   Procedure: POST PARTUM TUBAL LIGATION;  Surgeon: Conan Bowensavis, Kelly M, MD;  [facility];  Service: Gynecology;  Laterality: N/A;     OB History    Gravida  6   Para  4   Term  3   Preterm  1   AB  2   Living  3     SAB  2   IAB  0   Ectopic  0   Multiple  0   Live Births  3           Family History  Problem Relation Age of Onset  . Hypertension Mother   . Depression Mother   . Breast cancer Paternal Grandmother   . Diabetes Paternal Grandmother   . Asthma Maternal Grandmother   . Other Neg Hx     Social History   Tobacco Use  .  Smoking status: Current Every Day Smoker    Packs/day: 0.00    Years: 0.50    Pack years: 0.00    Types: Cigarettes    Last attempt to quit: 03/16/2017    Years since quitting: 4.0  . Smokeless tobacco: Never Used  Vaping Use  . Vaping Use: Never used  Substance Use Topics  . Alcohol use: Yes    Alcohol/week: 0.0 standard drinks    Comment: occassionally  . Drug use: Not Currently    Types: Marijuana    Comment: no more    Home Medications Prior to Admission medications   Medication Sig Start Date End Date Taking? Authorizing Provider  acetaminophen (TYLENOL) 500 MG tablet Take 1,000 mg by mouth every 6 (six) hours as needed for moderate pain.     [provider]  albuterol (PROVENTIL HFA;VENTOLIN HFA) 108 (90 Base) MCG/ACT inhaler Inhale 1-2 puffs into the lungs every 6 (six) hours as needed for  wheezing or shortness of breath. 03/17/18   Hedges, Tinnie Gens, PA-C  albuterol (PROVENTIL) (2.5 MG/3ML) 0.083% nebulizer solution Take 3 mLs (2.5 mg total) by nebulization every 6 (six) hours as needed for wheezing or shortness of breath. 06/17/20   Rolan Bucco, MD  albuterol (VENTOLIN HFA) 108 (90 Base) MCG/ACT inhaler Inhale 1-2 puffs into the lungs every 6 (six) hours as needed for wheezing or shortness of breath. 05/14/20   Mannie Stabile, PA-C  azithromycin (ZITHROMAX) 250 MG tablet Take 1 tablet (250 mg total) by mouth daily. Take first 2 tablets together, then 1 every day until finished. 06/16/20   Roxy Horseman, PA-C  benzonatate (TESSALON) 100 MG capsule Take 1 capsule (100 mg total) by mouth every 8 (eight) hours. Patient not taking: Reported on 02/27/2021 05/14/20   Mannie Stabile, PA-C  dicyclomine (BENTYL) 20 MG tablet Take 1 tablet (20 mg total) by mouth 2 (two) times daily. Patient not taking: Reported on 02/27/2021 03/06/20   Army Melia A, PA-C  famotidine (PEPCID) 20 MG tablet Take 1 tablet (20 mg total) by mouth 2 (two) times daily. Patient not taking: Reported on 03/05/2020 03/19/19   Henderly, Britni A, PA-C  HYDROcodone-homatropine (HYCODAN) 5-1.5 MG/5ML syrup Take 5 mLs by mouth every 6 (six) hours as needed for cough. Patient not taking: Reported on 02/27/2021 06/17/20   Rolan Bucco, MD  hydrOXYzine (ATARAX/VISTARIL) 25 MG tablet Take 1 tablet (25 mg total) by mouth every 6 (six) hours. Patient not taking: Reported on 02/27/2021 08/12/20   Henderly, Britni A, PA-C  ibuprofen (ADVIL,MOTRIN) 800 MG tablet Take 1 tablet (800 mg total) by mouth 3 (three) times daily. Patient not taking: Reported on 03/05/2020 02/28/18   Rolm Bookbinder, CNM  loratadine (CLARITIN) 10 MG tablet Take 1 tablet (10 mg total) by mouth daily. Patient not taking: Reported on 03/05/2020 03/17/18   Hedges, Tinnie Gens, PA-C  metroNIDAZOLE (FLAGYL) 500 MG tablet Take 1 tablet (500 mg total) by mouth 2 (two) times  daily. Patient not taking: Reported on 03/05/2020 11/17/19   Brock Bad, MD  metroNIDAZOLE (METROGEL) 0.75 % vaginal gel Place 1 Applicatorful vaginally at bedtime. Apply one applicatorful to vagina at bedtime for 5 days 08/23/20   Brock Bad, MD  metroNIDAZOLE (METROGEL) 0.75 % vaginal gel Place 1 Applicatorful vaginally 2 (two) times daily. Apply one applicatorful to vagina at bedtime for 5 days 09/25/20   Brock Bad, MD  omeprazole (PRILOSEC) 20 MG capsule Take 1 capsule (20 mg total) by mouth daily. Patient  not taking: Reported on 03/05/2020 01/05/20   Petrucelli, Lelon Mast R, PA-C  ondansetron (ZOFRAN ODT) 4 MG disintegrating tablet Take 1 tablet (4 mg total) by mouth every 8 (eight) hours as needed for nausea or vomiting. 03/06/20   Army Melia A, PA-C  ondansetron (ZOFRAN ODT) 4 MG disintegrating tablet Take 1 tablet (4 mg total) by mouth every 8 (eight) hours as needed for nausea or vomiting. Patient not taking: Reported on 02/27/2021 05/14/20   Mannie Stabile, PA-C  predniSONE (DELTASONE) 20 MG tablet Take 2 tablets (40 mg total) by mouth daily. Patient not taking: Reported on 02/27/2021 06/16/20   Roxy Horseman, PA-C  sucralfate (CARAFATE) 1 GM/10ML suspension Take 10 mLs (1 g total) by mouth 4 (four) times daily -  with meals and at bedtime. Patient not taking: Reported on 03/05/2020 01/05/20   Petrucelli, Lelon Mast R, PA-C  valACYclovir (VALTREX) 500 MG tablet TAKE 1 TABLET (500 MG) BY MOUTH EVERY DAY TAKE TWICE A DAY FOR 3 DAYS FOR FLARE 08/23/20   Brock Bad, MD    Allergies    Eggs or egg-derived products, Latex, and Pecan nut (diagnostic)  Review of Systems   Review of Systems  Constitutional: Positive for chills and fatigue. Negative for fever.  HENT: Negative for congestion.   Respiratory: Negative for shortness of breath.   Cardiovascular: Negative for chest pain.  Gastrointestinal: Positive for blood in stool, diarrhea, nausea and vomiting. Negative  for abdominal pain and anal bleeding.  Genitourinary: Negative for dysuria.  Skin: Negative for rash.  Neurological: Negative for headaches.    Physical Exam Updated Vital Signs BP (!) 133/92 (BP Location: Left Arm)   Pulse 81   Temp 98.9 F (37.2 C)   Resp 17   LMP 03/29/2021   SpO2 100%   Physical Exam Vitals and nursing note reviewed. Exam conducted with a chaperone present.  Constitutional:      General: She is not in acute distress.    Appearance: Normal appearance. She is not ill-appearing.  HENT:     Head: Normocephalic.     Mouth/Throat:     Mouth: Mucous membranes are moist.  Cardiovascular:     Rate and Rhythm: Normal rate.  Pulmonary:     Effort: Pulmonary effort is normal. No respiratory distress.  Abdominal:     General: There is no distension.     Palpations: Abdomen is soft. There is mass.     Tenderness: There is abdominal tenderness. There is rebound. There is no guarding.  Genitourinary:    Comments: Chaperone present, bright red blood from the vagina, tenderness with speculum exam, slight fishy odor, no significant discharge besides bleeding noted Skin:    General: Skin is warm.  Neurological:     Mental Status: She is alert and oriented to person, place, and time. Mental status is at baseline.  Psychiatric:        Mood and Affect: Mood normal.     ED Results / Procedures / Treatments   Labs (all labs ordered are listed, but only abnormal results are displayed) Labs Reviewed  URINALYSIS, ROUTINE W REFLEX MICROSCOPIC - Abnormal; Notable for the following components:      Result Value   Hgb urine dipstick SMALL (*)    Bacteria, UA RARE (*)    All other components within normal limits  WET PREP, GENITAL  COMPREHENSIVE METABOLIC PANEL  LIPASE, BLOOD  CBC WITH DIFFERENTIAL/PLATELET  RAPID HIV SCREEN (HIV 1/2 AB+AG)  I-STAT BETA HCG BLOOD, ED (  [facility], [facility], [facility] ONLY)  POC OCCULT BLOOD, ED  GC/CHLAMYDIA PROBE AMP ([place]) NOT AT [facility]     EKG None  Radiology No results found.  Procedures Procedures   Medications Ordered in ED Medications - No data to display  ED Course  I have reviewed the triage vital signs and the nursing notes.  Pertinent labs & imaging results that were available during my care of the patient were reviewed by me and considered in my medical decision making (see chart for details).    MDM Rules/Calculators/A&P                          30 year old female presents the emergency department concern for mid abdominal crampy/sharp pain, nausea/vomiting/diarrhea associated with 2 bloody bowel movements.  Patient is currently on her menstruation.  Thought that she had abnormal vaginal odor/discharge prior to her menstruation.  On pelvic she does have active vaginal bleeding with bright red blood around the rectum, fecal occult was not done as it would have been positive, unclear if there is independent bleeding coming from the rectum.  Vital signs are stable on arrival, she is tender to palpation around the periumbilical area, no guarding.  She is also requesting full STD testing.  Wet prep shows clue cells, concerning for bacterial vaginosis. CT shows diffuse colitis. She is afebrile, no white count, possibly inflammatory.  She was supposed to follow-up with GI last year for colonoscopy.  She is unable to get in without insurance at this time we will refer her to the clinic for possible GI referral and follow-up.  Patient will be placed on Flagyl, she was educated on the potential reaction when combined with alcohol.  Patient will be discharged and treated as an outpatient.  Discharge plan and strict return to ED precautions discussed, patient verbalizes understanding and agreement.  Final Clinical Impression(s) / ED Diagnoses Final diagnoses:  None    Rx / DC Orders ED Discharge Orders    None       Rozelle Logan, DO 04/02/21 2226

## 2021-04-02 NOTE — ED Notes (Signed)
Pt transported to CT ?

## 2021-04-02 NOTE — Discharge Instructions (Addendum)
You have been seen and discharged from the emergency department.  Take antibiotic as directed. DO NOT drink alcohol while on this medication. This can result in a violent reaction. Follow-up with the clinic for reevaluation and further care. In regards to the colitis, you need to be evaluated by GI for possible colonoscopy. Take home medications as prescribed. If you have any worsening symptoms or further concerns for your health please return to an emergency department for further evaluation.

## 2021-04-03 LAB — GC/CHLAMYDIA PROBE AMP (~~LOC~~) NOT AT ARMC
Chlamydia: NEGATIVE
Comment: NEGATIVE
Comment: NORMAL
Neisseria Gonorrhea: NEGATIVE

## 2021-08-23 IMAGING — US US ABDOMEN LIMITED
1 series · 14 of 25 positions shown · non-contrast
Comparison: None recent

CLINICAL DATA: Abdominal pain

EXAM:
ULTRASOUND ABDOMEN LIMITED RIGHT UPPER QUADRANT

[Series 1: us abdomen limited · 14 of 58 slices shown]
[im 1/58]
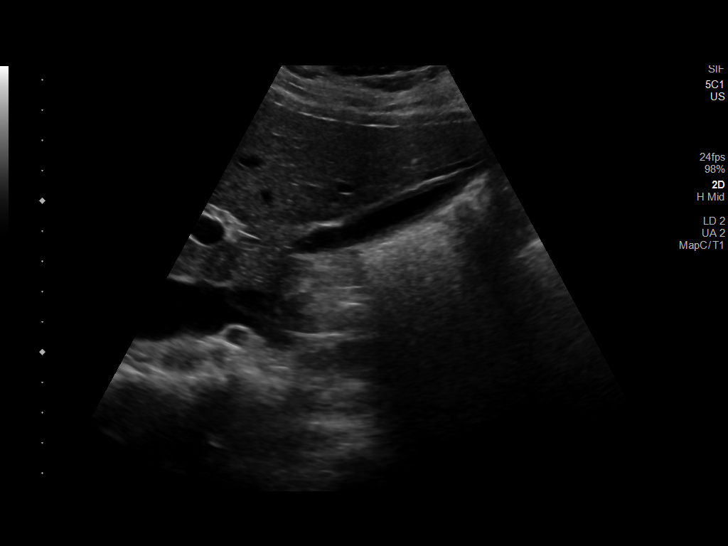
[im 5/58]
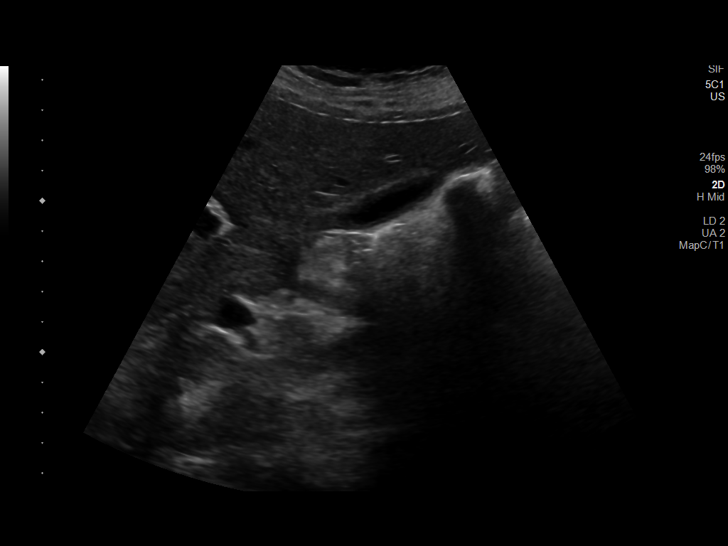
[im 10/58]
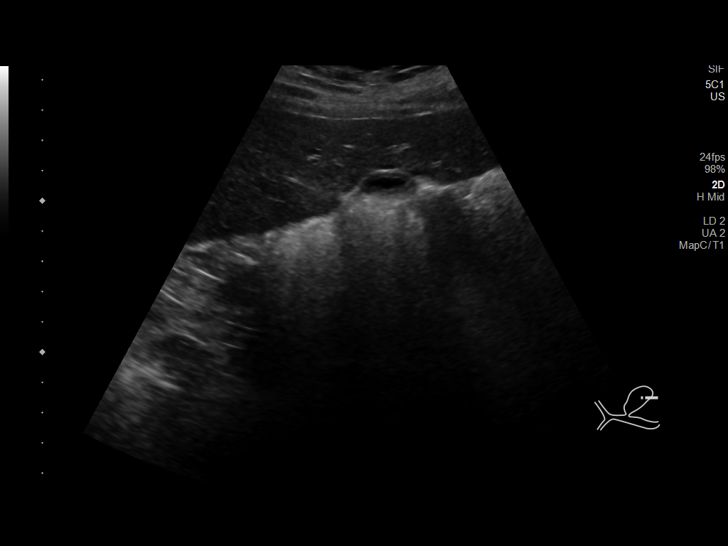
[im 15/58]
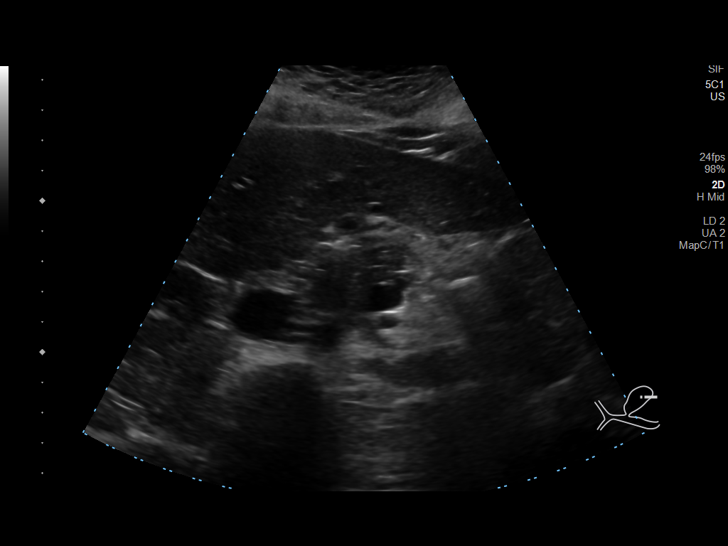
[im 20/58]
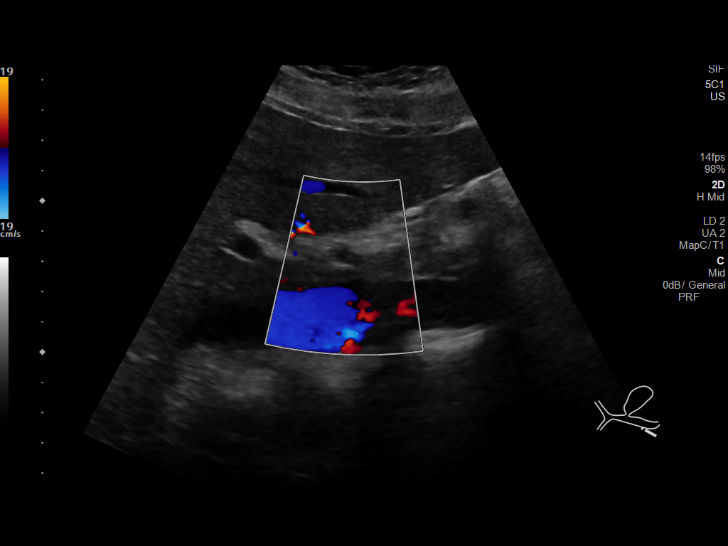
[im 22/58]
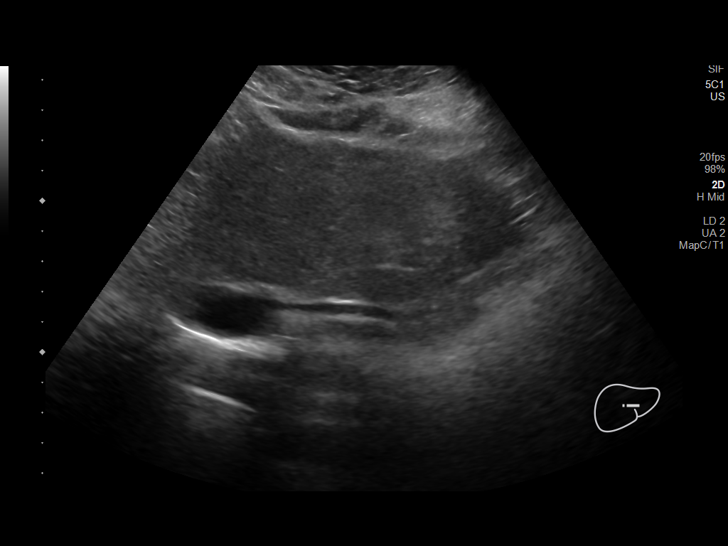
[im 27/58]
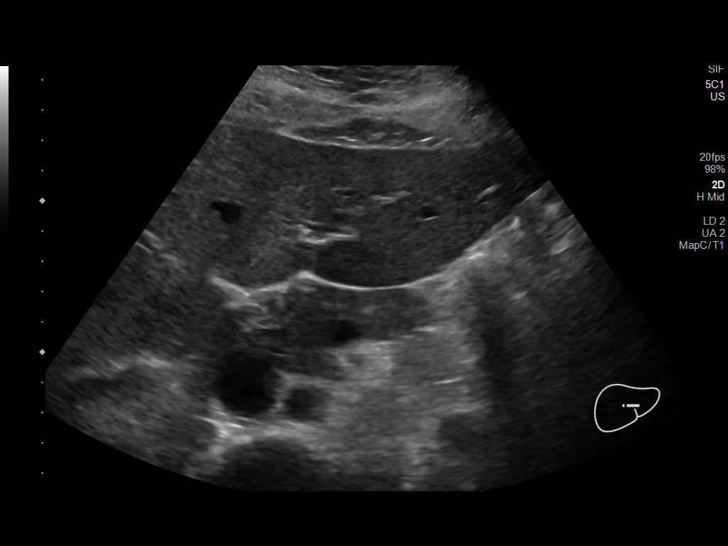
[im 31/58]
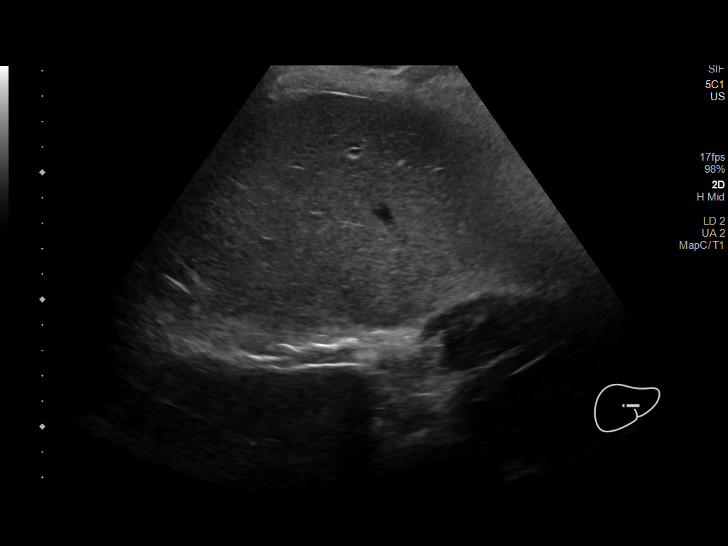
[im 36/58]
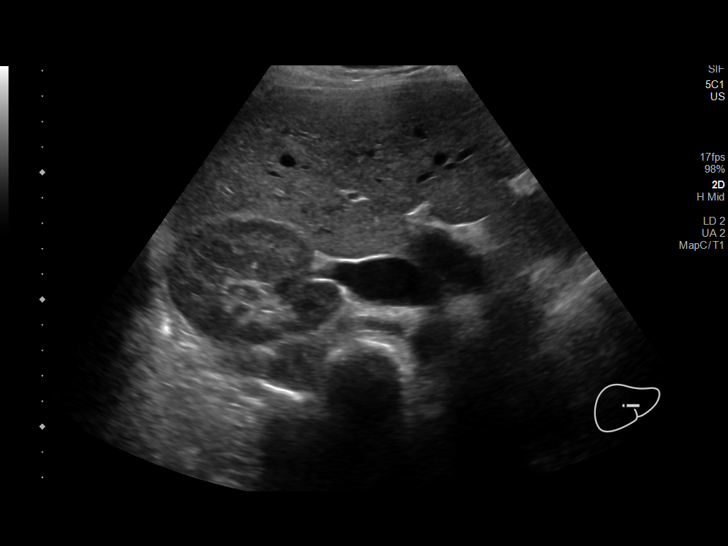
[im 39/58]
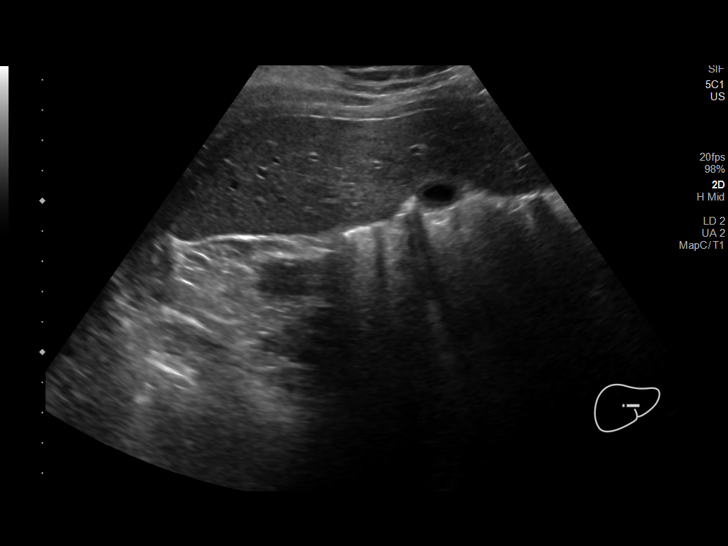
[im 43/58]
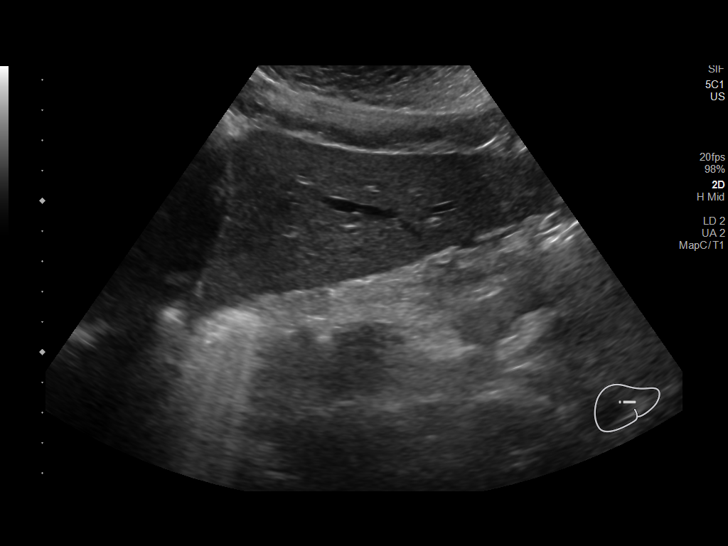
[im 48/58]
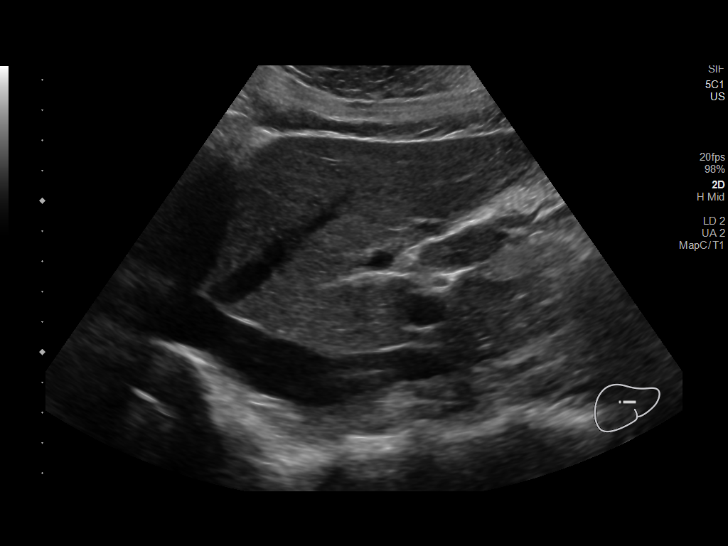
[im 53/58]
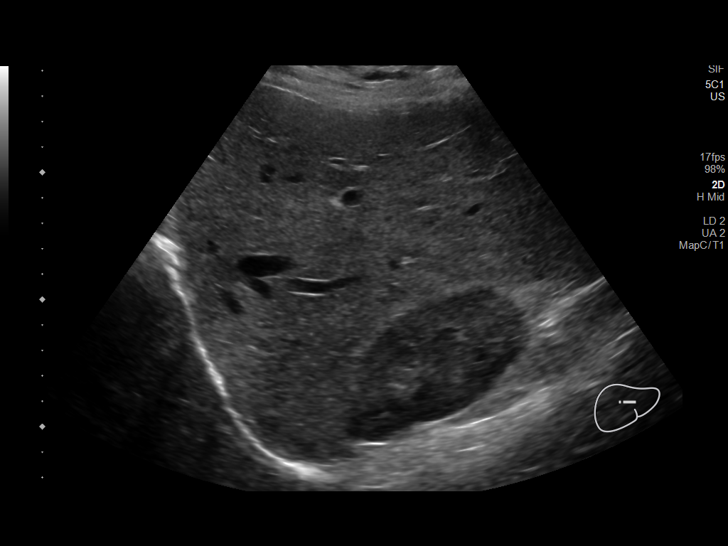
[im 58/58]
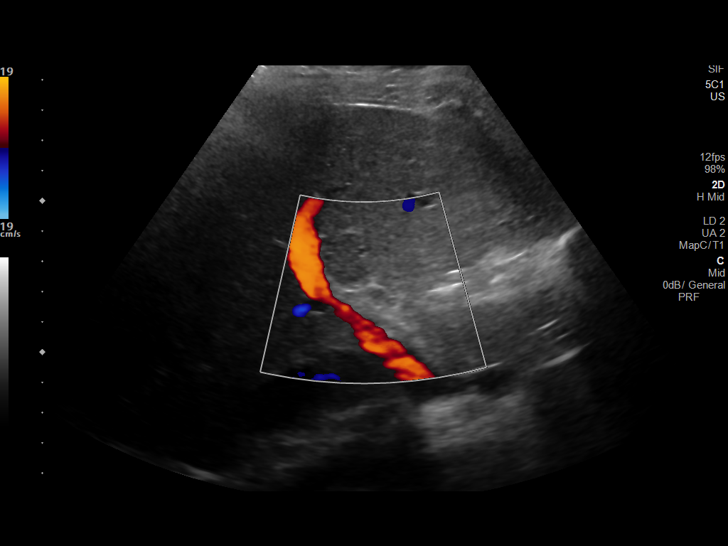

[14 of 25 positions shown; findings below may reference images not displayed]

FINDINGS: Gallbladder:

No gallstones or wall thickening visualized. No sonographic Murphy
sign noted by sonographer.

Common bile duct:

Diameter: 4 mm

Liver:

No focal lesion identified. Within normal limits in parenchymal
echogenicity. Portal vein is patent on color Doppler imaging with
normal direction of blood flow towards the liver.

Other: None.
IMPRESSION: Normal study.

Abdominal pain please pick the correct US ABDOMEN LIMITED template.

## 2021-09-04 ENCOUNTER — Other Ambulatory Visit: Payer: Self-pay

## 2021-09-04 ENCOUNTER — Encounter: Payer: Self-pay | Admitting: Obstetrics

## 2021-09-04 ENCOUNTER — Ambulatory Visit (INDEPENDENT_AMBULATORY_CARE_PROVIDER_SITE_OTHER): Payer: PRIVATE HEALTH INSURANCE | Admitting: Obstetrics

## 2021-09-04 ENCOUNTER — Other Ambulatory Visit (HOSPITAL_COMMUNITY)
Admission: RE | Admit: 2021-09-04 | Discharge: 2021-09-04 | Disposition: A | Discharge status: Home / Self Care | Payer: PRIVATE HEALTH INSURANCE | Source: Ambulatory Visit | Attending: Obstetrics | Admitting: Obstetrics

## 2021-09-04 VITALS — BP 127/80 | HR 81 | Ht 64.0 in | Wt 183.0 lb

## 2021-09-04 DIAGNOSIS — B9689 Other specified bacterial agents as the cause of diseases classified elsewhere: Secondary | ICD-10-CM

## 2021-09-04 DIAGNOSIS — N898 Other specified noninflammatory disorders of vagina: Secondary | ICD-10-CM | POA: Diagnosis present

## 2021-09-04 DIAGNOSIS — F17200 Nicotine dependence, unspecified, uncomplicated: Secondary | ICD-10-CM

## 2021-09-04 DIAGNOSIS — J301 Allergic rhinitis due to pollen: Secondary | ICD-10-CM

## 2021-09-04 DIAGNOSIS — Z113 Encounter for screening for infections with a predominantly sexual mode of transmission: Secondary | ICD-10-CM | POA: Diagnosis not present

## 2021-09-04 DIAGNOSIS — B009 Herpesviral infection, unspecified: Secondary | ICD-10-CM | POA: Diagnosis not present

## 2021-09-04 DIAGNOSIS — J4520 Mild intermittent asthma, uncomplicated: Secondary | ICD-10-CM

## 2021-09-04 DIAGNOSIS — N760 Acute vaginitis: Secondary | ICD-10-CM

## 2021-09-04 DIAGNOSIS — Z3169 Encounter for other general counseling and advice on procreation: Secondary | ICD-10-CM

## 2021-09-04 MED ORDER — VALACYCLOVIR HCL 500 MG PO TABS: ORAL_TABLET | ORAL | 99 refills | Status: DC

## 2021-09-04 MED ORDER — ALBUTEROL SULFATE HFA 108 (90 BASE) MCG/ACT IN AERS: 1.0000 | INHALATION_SPRAY | Freq: Four times a day (QID) | RESPIRATORY_TRACT | 6 refills | Status: DC | PRN

## 2021-09-04 MED ORDER — METRONIDAZOLE 0.75 % VA GEL: 1.0000 | Freq: Two times a day (BID) | VAGINAL | 6 refills | Status: DC

## 2021-09-04 MED ORDER — LORATADINE 10 MG PO TABS: 10.0000 mg | ORAL_TABLET | Freq: Every day | ORAL | 0 refills | Status: DC

## 2021-09-04 MED ORDER — ALBUTEROL SULFATE HFA 108 (90 BASE) MCG/ACT IN AERS: 1.0000 | INHALATION_SPRAY | Freq: Four times a day (QID) | RESPIRATORY_TRACT | 0 refills | Status: DC | PRN

## 2021-09-04 MED ORDER — VARENICLINE TARTRATE 0.5 MG X 11 & 1 MG X 42 PO MISC: ORAL | 0 refills | Status: DC

## 2021-09-04 MED ORDER — VARENICLINE TARTRATE 1 MG PO TABS: 1.0000 mg | ORAL_TABLET | Freq: Two times a day (BID) | ORAL | 1 refills | Status: DC

## 2021-09-04 NOTE — Progress Notes (Signed)
Requests STI swab and blood work. Self swab completed. Request for HSV blood work also. Reports had "two bumps down there" last week. Resolved. 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  Request renewal of inhaler RX. Unsure if RX for metrogel is expired.

## 2021-09-04 NOTE — Progress Notes (Signed)
Patient ID: Lauren Conway, female   DOB: 06-29-1991, 29 y.o.   MRN: [id number]  Chief Complaint  Patient presents with   Gynecologic Exam    HPI Lauren Conway is a 30 y.o. female.  Presents for STD testing.  Complains of vaginal discharge. 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   PID (pelvic inflammatory disease)     Past Surgical History:  Procedure Laterality Date   TUBAL LIGATION N/A 10/29/2017   Procedure: POST PARTUM TUBAL LIGATION;  Surgeon: Conan Bowens, MD;  Location: [facility];  Service: Gynecology;  Laterality: N/A;    Family History  Problem Relation Age of Onset   Hypertension Mother    Depression Mother    Breast cancer Paternal Grandmother    Diabetes Paternal Grandmother    Asthma Maternal Grandmother    Other Neg Hx     Social History Social History   Tobacco Use   Smoking status: Every Day    Packs/day: 0.00    Years: 0.50    Pack years: 0.00    Types: Cigarettes    Last attempt to quit: 03/16/2017    Years since quitting: 4.4   Smokeless tobacco: Never  Vaping Use   Vaping Use: Never used  Substance Use Topics   Alcohol use: Yes    Alcohol/week: 0.0 standard drinks    Comment: occassionally   Drug use: Not Currently    Types: Marijuana    Comment: no more    Allergies  Allergen Reactions   Eggs Or Egg-Derived Products Hives and Swelling   Latex Itching and Swelling   Pecan Nut (Diagnostic) Hives    Current Outpatient Medications  Medication Sig Dispense Refill   acetaminophen (TYLENOL) 500 MG tablet Take 1,000 mg by mouth every 6 (six) hours as needed for moderate pain.      albuterol (PROVENTIL HFA;VENTOLIN HFA) 108 (90 Base) MCG/ACT inhaler Inhale 1-2 puffs into the lungs  every 6 (six) hours as needed for wheezing or shortness of breath. (Patient not taking: Reported on 09/04/2021) 1 Inhaler 0   albuterol (PROVENTIL) (2.5 MG/3ML) 0.083% nebulizer solution Take 3 mLs (2.5 mg total) by nebulization every 6 (six) hours as needed for wheezing or shortness of breath. 75 mL 12   albuterol (VENTOLIN HFA) 108 (90 Base) MCG/ACT inhaler Inhale 1-2 puffs into the lungs every 6 (six) hours as needed for wheezing or shortness of breath. 18 g 0   azithromycin (ZITHROMAX) 250 MG tablet Take 1 tablet (250 mg total) by mouth daily. Take first 2 tablets together, then 1 every day until finished. 6 tablet 0   benzonatate (TESSALON) 100 MG capsule Take 1 capsule (100 mg total) by mouth every 8 (eight) hours. 21 capsule 0   dicyclomine (BENTYL) 20 MG tablet Take 1 tablet (20 mg total) by mouth 2 (two) times daily. 10 tablet 0   famotidine (PEPCID) 20 MG tablet Take 1 tablet (20 mg total) by mouth 2 (two) times daily. 30 tablet 0   HYDROcodone-homatropine (HYCODAN) 5-1.5 MG/5ML syrup Take 5 mLs by mouth every 6 (six) hours as needed for cough. 120 mL 0   hydrOXYzine (  ATARAX/VISTARIL) 25 MG tablet Take 1 tablet (25 mg total) by mouth every 6 (six) hours. 12 tablet 0   ibuprofen (ADVIL,MOTRIN) 800 MG tablet Take 1 tablet (800 mg total) by mouth 3 (three) times daily. 30 tablet 0   loratadine (CLARITIN) 10 MG tablet Take 1 tablet (10 mg total) by mouth daily. (Patient not taking: Reported on 03/05/2020) 30 tablet 0   metroNIDAZOLE (FLAGYL) 500 MG tablet Take 1 tablet (500 mg total) by mouth 2 (two) times daily. 14 tablet 0   metroNIDAZOLE (METROGEL) 0.75 % vaginal gel Place 1 Applicatorful vaginally at bedtime. Apply one applicatorful to vagina at bedtime for 5 days 70 g 0   metroNIDAZOLE (METROGEL) 0.75 % vaginal gel Place 1 Applicatorful vaginally 2 (two) times daily. Apply one applicatorful to vagina at bedtime for 5 days 70 g 6   omeprazole (PRILOSEC) 20 MG capsule Take 1 capsule (20 mg  total) by mouth daily. 30 capsule 0   ondansetron (ZOFRAN ODT) 4 MG disintegrating tablet Take 1 tablet (4 mg total) by mouth every 8 (eight) hours as needed for nausea or vomiting. (Patient not taking: Reported on 09/04/2021) 12 tablet 0   ondansetron (ZOFRAN ODT) 4 MG disintegrating tablet Take 1 tablet (4 mg total) by mouth every 8 (eight) hours as needed for nausea or vomiting. 20 tablet 0   predniSONE (DELTASONE) 20 MG tablet Take 2 tablets (40 mg total) by mouth daily. 10 tablet 0   sucralfate (CARAFATE) 1 GM/10ML suspension Take 10 mLs (1 g total) by mouth 4 (four) times daily -  with meals and at bedtime. 420 mL 0   valACYclovir (VALTREX) 500 MG tablet TAKE 1 TABLET (500 MG) BY MOUTH EVERY DAY TAKE TWICE A DAY FOR 3 DAYS FOR FLARE (Patient not taking: Reported on 09/04/2021) 36 tablet PRN   No current facility-administered medications for this visit.    Review of Systems Review of Systems Constitutional: negative for fatigue and weight loss Respiratory: negative for cough and wheezing Cardiovascular: negative for chest pain, fatigue and palpitations Gastrointestinal: negative for abdominal pain and change in bowel habits Genitourinary:positive for vaginal discharge Integument/breast: negative for nipple discharge Musculoskeletal:negative for myalgias Neurological: negative for gait problems and tremors Behavioral/Psych: negative for abusive relationship, depression Endocrine: negative for temperature intolerance      Blood pressure 127/80, pulse 81, height 5\' 4"  (1.626 m), weight 183 lb (83 kg).  Physical Exam Physical Exam General:   Alert and no distress  Skin:   no rash or abnormalities  Lungs:   clear to auscultation bilaterally  Heart:   regular rate and rhythm, S1, S2 normal, no murmur, click, rub or gallop  Breasts:   normal without suspicious masses, skin or nipple changes or axillary nodes  Abdomen:  normal findings: no organomegaly, soft, non-tender and no hernia   Pelvis:  External genitalia: normal general appearance Urinary system: urethral meatus normal and bladder without fullness, nontender Vaginal: normal without tenderness, induration or masses Cervix: normal appearance Adnexa: normal bimanual exam Uterus: anteverted and non-tender, normal size    I have spent a total of 15 minutes of face-to-face and time, excluding clinical staff time, reviewing notes and preparing to see patient, ordering tests and/or medications, and counseling the patient.   Data Reviewed Wet Prep Cultures  Assessment      1. Vaginal discharge Rx: - Cervicovaginal ancillary only( [place])  2. Screening examination for STD (sexually transmitted disease) Rx: - Hepatitis B surface antigen - Hepatitis C antibody -  HIV Antibody (routine testing w rflx) - RPR  3. Herpes Rx: - valACYclovir (VALTREX) 500 MG tablet; TAKE 1 TABLET (500 MG) BY MOUTH EVERY DAY TAKE TWICE A DAY FOR 3 DAYS FOR FLARE  Dispense: 36 tablet; Refill: PRN  4. BV (bacterial vaginosis) Rx: - metroNIDAZOLE (METROGEL) 0.75 % vaginal gel; Place 1 Applicatorful vaginally 2 (two) times daily. Apply one applicatorful to vagina at bedtime for 5 days  Dispense: 70 g; Refill: 6  5. Mild intermittent asthma without complication Rx: - albuterol (VENTOLIN HFA) 108 (90 Base) MCG/ACT inhaler; Inhale 1-2 puffs into the lungs every 6 (six) hours as needed for wheezing or shortness of breath.  Dispense: 1 each; Refill: 6  6. Seasonal allergic rhinitis due to pollen Rx: - loratadine (CLARITIN) 10 MG tablet; Take 1 tablet (10 mg total) by mouth daily.  Dispense: 30 tablet; Refill: 0  7. Tobacco dependence Rx: - varenicline (CHANTIX STARTING MONTH PAK) 0.5 MG X 11 & 1 MG X 42 tablet; Take one 0.5 mg tablet by mouth once daily for 3 days, then increase to one 0.5 mg tablet twice daily for 4 days, then increase to one 1 mg tablet twice daily.  Dispense: 53 tablet; Refill: 0 - varenicline (CHANTIX CONTINUING  MONTH PAK) 1 MG tablet; Take 1 tablet (1 mg total) by mouth 2 (two) times daily.  Dispense: 60 tablet; Refill: 1  8. Encounter for preconception consultation - encouraged folic acid to prevent NTD's - healthy diet and lifestyle recommended   Plan   Follow up in 5 weeks   Brock Bad, MD 09/04/2021 3:25 PM

## 2021-09-05 ENCOUNTER — Other Ambulatory Visit: Payer: Self-pay | Admitting: Obstetrics

## 2021-09-05 LAB — CERVICOVAGINAL ANCILLARY ONLY
Bacterial Vaginitis (gardnerella): POSITIVE — AB
Candida Glabrata: NEGATIVE
Candida Vaginitis: NEGATIVE
Chlamydia: NEGATIVE
Comment: NEGATIVE
Comment: NEGATIVE
Comment: NEGATIVE
Comment: NEGATIVE
Comment: NEGATIVE
Comment: NORMAL
Neisseria Gonorrhea: NEGATIVE
Trichomonas: NEGATIVE

## 2021-09-05 LAB — HEPATITIS C ANTIBODY: Hep C Virus Ab: <0.1 s/co ratio (ref 0.0–0.9)

## 2021-09-05 LAB — HEPATITIS B SURFACE ANTIGEN: Hepatitis B Surface Ag: NEGATIVE

## 2021-09-05 LAB — RPR: RPR Ser Ql: NONREACTIVE

## 2021-09-05 LAB — HIV ANTIBODY (ROUTINE TESTING W REFLEX): HIV Screen 4th Generation wRfx: NONREACTIVE

## 2021-09-28 ENCOUNTER — Other Ambulatory Visit: Payer: Self-pay | Admitting: Obstetrics

## 2021-09-28 DIAGNOSIS — F17200 Nicotine dependence, unspecified, uncomplicated: Secondary | ICD-10-CM

## 2021-10-09 ENCOUNTER — Ambulatory Visit: Payer: PRIVATE HEALTH INSURANCE | Admitting: Obstetrics

## 2021-10-17 ENCOUNTER — Ambulatory Visit: Payer: PRIVATE HEALTH INSURANCE

## 2021-11-27 ENCOUNTER — Other Ambulatory Visit: Payer: Self-pay

## 2021-11-27 ENCOUNTER — Other Ambulatory Visit (HOSPITAL_COMMUNITY)
Admission: RE | Admit: 2021-11-27 | Discharge: 2021-11-27 | Disposition: A | Discharge status: Home / Self Care | Payer: PRIVATE HEALTH INSURANCE | Source: Ambulatory Visit | Attending: Obstetrics and Gynecology | Admitting: Obstetrics and Gynecology

## 2021-11-27 ENCOUNTER — Ambulatory Visit (INDEPENDENT_AMBULATORY_CARE_PROVIDER_SITE_OTHER): Payer: PRIVATE HEALTH INSURANCE | Admitting: *Deleted

## 2021-11-27 DIAGNOSIS — Z113 Encounter for screening for infections with a predominantly sexual mode of transmission: Secondary | ICD-10-CM | POA: Insufficient documentation

## 2021-11-27 DIAGNOSIS — N898 Other specified noninflammatory disorders of vagina: Secondary | ICD-10-CM

## 2021-11-27 NOTE — Progress Notes (Signed)
Patient was assessed and managed by nursing staff during this encounter. I have reviewed the chart and agree with the documentation and plan. I have also made any necessary editorial changes.  Warden Fillers, MD 11/27/2021 1:07 PM

## 2021-11-27 NOTE — Progress Notes (Signed)
SUBJECTIVE:  30 y.o. female presents for sti screening and vaginal discharge.  Denies abnormal vaginal bleeding or significant pelvic pain or fever. No UTI symptoms. Denies history of known exposure to STD.  No LMP recorded.  OBJECTIVE:  She appears well, afebrile. Urine dipstick: not done.  ASSESSMENT:  Vaginal Discharge    PLAN:  GC, chlamydia, trichomonas, BVAG, CVAG probe sent to lab. Treatment: To be determined once lab results are received ROV prn if symptoms persist or worsen.

## 2021-11-28 LAB — CERVICOVAGINAL ANCILLARY ONLY
Bacterial Vaginitis (gardnerella): POSITIVE — AB
Candida Glabrata: NEGATIVE
Candida Vaginitis: NEGATIVE
Chlamydia: NEGATIVE
Comment: NEGATIVE
Comment: NEGATIVE
Comment: NEGATIVE
Comment: NEGATIVE
Comment: NEGATIVE
Comment: NORMAL
Neisseria Gonorrhea: NEGATIVE
Trichomonas: NEGATIVE

## 2021-12-03 ENCOUNTER — Other Ambulatory Visit: Payer: Self-pay | Admitting: *Deleted

## 2021-12-03 DIAGNOSIS — N898 Other specified noninflammatory disorders of vagina: Secondary | ICD-10-CM

## 2021-12-03 MED ORDER — METRONIDAZOLE 0.75 % VA GEL: 1.0000 | Freq: Every day | VAGINAL | 0 refills | Status: DC

## 2021-12-03 NOTE — Progress Notes (Signed)
Metrogel sent for +BV See lab result 

## 2022-01-01 ENCOUNTER — Ambulatory Visit: Payer: PRIVATE HEALTH INSURANCE | Admitting: Obstetrics & Gynecology

## 2022-01-08 ENCOUNTER — Ambulatory Visit (INDEPENDENT_AMBULATORY_CARE_PROVIDER_SITE_OTHER): Payer: PRIVATE HEALTH INSURANCE | Admitting: Podiatry

## 2022-01-08 ENCOUNTER — Other Ambulatory Visit: Payer: Self-pay

## 2022-01-08 DIAGNOSIS — L309 Dermatitis, unspecified: Secondary | ICD-10-CM | POA: Diagnosis not present

## 2022-01-08 MED ORDER — CLOTRIMAZOLE-BETAMETHASONE 1-0.05 % EX CREA: 1.0000 "application " | TOPICAL_CREAM | Freq: Every day | CUTANEOUS | 2 refills | Status: DC

## 2022-01-08 NOTE — Patient Instructions (Signed)
Look for urea 40% cream or ointment and apply to the thickened dry skin / calluses. This can be bought over the counter, at a pharmacy or online such as [employer].  

## 2022-01-09 ENCOUNTER — Other Ambulatory Visit (HOSPITAL_COMMUNITY)
Admission: RE | Admit: 2022-01-09 | Discharge: 2022-01-09 | Disposition: A | Discharge status: Home / Self Care | Payer: PRIVATE HEALTH INSURANCE | Source: Ambulatory Visit | Attending: Obstetrics and Gynecology | Admitting: Obstetrics and Gynecology

## 2022-01-09 ENCOUNTER — Ambulatory Visit (INDEPENDENT_AMBULATORY_CARE_PROVIDER_SITE_OTHER): Payer: PRIVATE HEALTH INSURANCE

## 2022-01-09 DIAGNOSIS — Z202 Contact with and (suspected) exposure to infections with a predominantly sexual mode of transmission: Secondary | ICD-10-CM

## 2022-01-09 NOTE — Progress Notes (Signed)
Patient presents for a self swab for STD Screening. Patient states that she was told by her partner that someone that he has previously been with tested positive for Gonorrhea. Patient denies having any symptoms at this time and would just like to make sure she is negative for STDs. She would also like to be checked for yeast and bv. Partner has been tested, but is awaiting results. Patient has no other concerns at this time.  Patient informed that results will take 24-48 hours to return and that we will determine treatment at that time if needed.

## 2022-01-09 NOTE — Progress Notes (Signed)
Patient was assessed and managed by nursing staff during this encounter. I have reviewed the chart and agree with the documentation and plan. I have also made any necessary editorial changes.  Warden Fillers, MD 01/09/2022 10:26 AM

## 2022-01-10 LAB — CERVICOVAGINAL ANCILLARY ONLY
Bacterial Vaginitis (gardnerella): POSITIVE — AB
Candida Glabrata: NEGATIVE
Candida Vaginitis: NEGATIVE
Chlamydia: NEGATIVE
Comment: NEGATIVE
Comment: NEGATIVE
Comment: NEGATIVE
Comment: NEGATIVE
Comment: NEGATIVE
Comment: NORMAL
Neisseria Gonorrhea: NEGATIVE
Trichomonas: NEGATIVE

## 2022-01-11 ENCOUNTER — Other Ambulatory Visit: Payer: Self-pay

## 2022-01-11 DIAGNOSIS — B9689 Other specified bacterial agents as the cause of diseases classified elsewhere: Secondary | ICD-10-CM

## 2022-01-11 DIAGNOSIS — N760 Acute vaginitis: Secondary | ICD-10-CM

## 2022-01-11 MED ORDER — METRONIDAZOLE 500 MG PO TABS: 500.0000 mg | ORAL_TABLET | Freq: Two times a day (BID) | ORAL | 0 refills | Status: DC

## 2022-01-12 NOTE — Progress Notes (Signed)
°  Subjective:  Patient ID: Lauren Conway, female    DOB: 11-10-1991,  MRN: [id number]  Chief Complaint  Patient presents with   Callouses    Patient reports thick skin build-up around the lateral aspect of the left heel. Onset about 10 years ago but has progressively gotten worse and pedicures are not helping like they use to.    Nail Problem    Right hallux nail trauma reported by patient onset about 8-10 months ago.    31 y.o. female presents with the above complaint. History confirmed with patient.   Objective:  Physical Exam: warm, good capillary refill, dry cracking heels, no trophic changes or ulcerative lesions, normal DP and PT pulses, normal sensory exam, and thickened skin around medial heels  Assessment:   1. Dermatitis of foot      Plan:  Patient was evaluated and treated and all questions answered.  Suspect this is tinea and dermatitis, she has a history of eczema that previously needed temovate. Recommended lotrisone cream which Rx was sent for. If not improving would consider protopic/ultravate and possible biopsy  Return in about 2 months (around 03/08/2022) for recheck skin .

## 2022-02-13 ENCOUNTER — Ambulatory Visit: Payer: PRIVATE HEALTH INSURANCE | Admitting: Obstetrics

## 2022-03-12 ENCOUNTER — Ambulatory Visit: Payer: PRIVATE HEALTH INSURANCE | Admitting: Gastroenterology

## 2022-03-17 ENCOUNTER — Other Ambulatory Visit: Payer: Self-pay

## 2022-03-17 ENCOUNTER — Encounter (HOSPITAL_BASED_OUTPATIENT_CLINIC_OR_DEPARTMENT_OTHER): Payer: Self-pay

## 2022-03-17 DIAGNOSIS — Z20822 Contact with and (suspected) exposure to covid-19: Secondary | ICD-10-CM | POA: Insufficient documentation

## 2022-03-17 DIAGNOSIS — Z7951 Long term (current) use of inhaled steroids: Secondary | ICD-10-CM | POA: Insufficient documentation

## 2022-03-17 DIAGNOSIS — J302 Other seasonal allergic rhinitis: Secondary | ICD-10-CM | POA: Insufficient documentation

## 2022-03-17 DIAGNOSIS — Z91040 Latex allergy status: Secondary | ICD-10-CM | POA: Insufficient documentation

## 2022-03-17 DIAGNOSIS — J45909 Unspecified asthma, uncomplicated: Secondary | ICD-10-CM | POA: Insufficient documentation

## 2022-03-17 NOTE — ED Triage Notes (Signed)
Pt reports runny nose, cough, itchy eyes and sore throat x 4 days ?

## 2022-03-18 ENCOUNTER — Emergency Department (HOSPITAL_BASED_OUTPATIENT_CLINIC_OR_DEPARTMENT_OTHER)
Admission: EM | Admit: 2022-03-18 | Discharge: 2022-03-18 | Disposition: A | Discharge status: Home / Self Care | Payer: PRIVATE HEALTH INSURANCE | Attending: Emergency Medicine | Admitting: Emergency Medicine

## 2022-03-18 DIAGNOSIS — R0981 Nasal congestion: Secondary | ICD-10-CM

## 2022-03-18 DIAGNOSIS — J302 Other seasonal allergic rhinitis: Secondary | ICD-10-CM

## 2022-03-18 LAB — GROUP A STREP BY PCR: Group A Strep by PCR: NOT DETECTED

## 2022-03-18 LAB — RESP PANEL BY RT-PCR (FLU A&B, COVID) ARPGX2
Influenza A by PCR: NEGATIVE
Influenza B by PCR: NEGATIVE
SARS Coronavirus 2 by RT PCR: NEGATIVE

## 2022-03-18 MED ORDER — DEXAMETHASONE 4 MG PO TABS
10.0000 mg | ORAL_TABLET | Freq: Once | ORAL | Status: AC
  Administered 2022-03-18: 10 mg via ORAL
  Filled 2022-03-18: qty 3

## 2022-03-18 MED ORDER — SALINE SPRAY 0.65 % NA SOLN: 2.0000 | NASAL | 0 refills | Status: DC | PRN

## 2022-03-18 MED ORDER — FLUTICASONE PROPIONATE 50 MCG/ACT NA SUSP: 1.0000 | Freq: Every day | NASAL | 2 refills | Status: DC

## 2022-03-18 NOTE — ED Provider Notes (Signed)
?[facility]PT ?Provider Note ? ? ?CSN: [id number] ?Arrival date & time: 03/17/22  2344 ? ?  ? ?History ? ?Chief Complaint  ?Patient presents with  ? Nasal Congestion  ? ? ?Lauren Conway is a 31 y.o. female. ? ?HPI ? ?  ? ?This is a 31 year old female who presents with cough, runny nose, itchy watery eyes, sneezing.  No known fevers.  Daughter with similar symptoms.  Patient reports history of seasonal allergies and asthma.  She takes Zyrtec daily.  She has also been using her inhaler but denies significant shortness of breath.  She reports worsening congestion and rhinorrhea. ? ?Home Medications ?Prior to Admission medications   ?Medication Sig Start Date End Date Taking? Authorizing Provider  ?fluticasone (FLONASE) 50 MCG/ACT nasal spray Place 1 spray into both nostrils daily. 03/18/22  Yes Luay Balding, Mayer Maskerourtney F, MD  ?sodium chloride (OCEAN) 0.65 % SOLN nasal spray Place 2 sprays into both nostrils as needed for congestion. 03/18/22  Yes Skye Plamondon, Mayer Maskerourtney F, MD  ?albuterol (VENTOLIN HFA) 108 (90 Base) MCG/ACT inhaler Inhale 1-2 puffs into the lungs every 6 (six) hours as needed for wheezing or shortness of breath. 09/04/21   Brock BadHarper, Charles A, MD  ?clotrimazole-betamethasone (LOTRISONE) cream Apply 1 application topically daily. 01/08/22   McDonald, Rachelle HoraAdam R, DPM  ?metroNIDAZOLE (FLAGYL) 500 MG tablet Take 1 tablet (500 mg total) by mouth 2 (two) times daily. 01/11/22   Warden FillersBass, Lawrence A, MD  ?valACYclovir (VALTREX) 500 MG tablet TAKE 1 TABLET (500 MG) BY MOUTH EVERY DAY TAKE TWICE A DAY FOR 3 DAYS FOR FLARE 09/04/21   Brock BadHarper, Charles A, MD  ?   ? ?Allergies    ?Eggs or egg-derived products, Latex, and Pecan nut (diagnostic)   ? ?Review of Systems   ?Review of Systems  ?Constitutional:  Negative for fever.  ?HENT:  Positive for congestion, rhinorrhea and sneezing.   ?Eyes:  Positive for discharge and itching.  ?Respiratory:  Positive for cough. Negative for shortness of breath.   ?All other systems  reviewed and are negative. ? ?Physical Exam ?Updated Vital Signs ?BP 123/83 (BP Location: Right Arm)   Pulse (!) 105   Temp 98.4 ?F (36.9 ?C)   Resp 16   Ht 1.6 m (5\' 3" )   Wt 79.4 kg   LMP 02/26/2022 (Exact Date)   SpO2 100%   BMI 31.00 kg/m?  ?Physical Exam ?Vitals and nursing note reviewed.  ?Constitutional:   ?   Appearance: She is well-developed. She is not ill-appearing.  ?HENT:  ?   Head: Normocephalic and atraumatic.  ?   Right Ear: Tympanic membrane normal.  ?   Left Ear: Tympanic membrane normal.  ?   Nose: Congestion and rhinorrhea present.  ?   Mouth/Throat:  ?   Mouth: Mucous membranes are moist.  ?   Pharynx: No oropharyngeal exudate or posterior oropharyngeal erythema.  ?Eyes:  ?   Pupils: Pupils are equal, round, and reactive to light.  ?Cardiovascular:  ?   Rate and Rhythm: Normal rate and regular rhythm.  ?   Heart sounds: Normal heart sounds.  ?Pulmonary:  ?   Effort: Pulmonary effort is normal. No respiratory distress.  ?   Breath sounds: Normal breath sounds. No wheezing.  ?Abdominal:  ?   Palpations: Abdomen is soft.  ?Musculoskeletal:  ?   Cervical back: Neck supple.  ?Skin: ?   General: Skin is warm and dry.  ?Neurological:  ?   Mental Status: She is alert and  oriented to person, place, and time.  ?Psychiatric:     ?   Mood and Affect: Mood normal.  ? ? ?ED Results / Procedures / Treatments   ?Labs ?(all labs ordered are listed, but only abnormal results are displayed) ?Labs Reviewed  ?GROUP A STREP BY PCR  ?RESP PANEL BY RT-PCR (FLU A&B, COVID) [id number]  ? ? ?EKG ?None ? ?Radiology ?No results found. ? ?Procedures ?Procedures  ? ? ?Medications Ordered in ED ?Medications  ?dexamethasone (DECADRON) tablet 10 mg (10 mg Oral Given 03/18/22 0104)  ? ? ?ED Course/ Medical Decision Making/ A&P ?  ?                        ?Medical Decision Making ?Risk ?OTC drugs. ?Prescription drug management. ? ? ?This patient presents to the ED for concern of upper respiratory symptoms, this involves an  extensive number of treatment options, and is a complaint that carries with it a high risk of complications and morbidity.  The differential diagnosis includes seasonal allergies, URI viral etiology, asthma exacerbation less likely pneumonia ? ?MDM:   ? ?This is a 31 year old female who presents with upper respiratory symptoms.  She is nontoxic and vital signs are largely reassuring.  She is in no respiratory distress.  Most of her symptoms are suggestive of seasonal allergies especially given that she is afebrile.  She does not have any ongoing wheezing or respiratory distress but reports cough and has a history of asthma.  Will be given a dose of Decadron.  COVID, influenza, and strep testing sent and negative.  Lower suspicion for viral etiology.  Do not feel she needs advanced work-up or imaging.  Recommend adding Flonase and nasal saline.  Do not feel her presentation is consistent with acute sinusitis. ? ?(Labs, imaging) ? ?Labs: ?I Ordered, and personally interpreted labs.  The pertinent results include: Negative COVID, influenza testing ? ?Imaging Studies ordered: ?I ordered imaging studies including N/A ?I independently visualized and interpreted imaging. ?I agree with the radiologist interpretation ? ?Additional history obtained from chart review.  External records from outside source obtained and reviewed including evaluation ? ?Critical Interventions: ?Decadron ? ?Consultations: ?I requested consultation with the NA,  and discussed lab and imaging findings as well as pertinent plan - they recommend: N/A ? ?Cardiac Monitoring: ?The patient was maintained on a cardiac monitor.  I personally viewed and interpreted the cardiac monitored which showed an underlying rhythm of: Normal sinus rhythm ? ?Reevaluation: ?After the interventions noted above, I reevaluated the patient and found that they have :improved ? ? ?Considered admission for: N/A ? ?Social Determinants of Health: ?Lives  independently ? ?Disposition: Discharge ? ?Co morbidities that complicate the patient evaluation ? ?Past Medical History:  ?Diagnosis Date  ? Anemia   ? Anxiety   ? Asthma   ? uses inhaler, last used 8/22  ? Depression   ? Eczema   ? History of chlamydia   ? 12/2008; 03/09/2009  ? History of gonorrhea   ? 12/2008; 09/2010;   ? HSV-1 (herpes simplex virus 1) infection 2016  ? vaginal  ? PID (pelvic inflammatory disease)   ?  ? ?Medicines ?Meds ordered this encounter  ?Medications  ? dexamethasone (DECADRON) tablet 10 mg  ? fluticasone (FLONASE) 50 MCG/ACT nasal spray  ?  Sig: Place 1 spray into both nostrils daily.  ?  Dispense:  9.9 mL  ?  Refill:  2  ? sodium chloride (OCEAN) 0.65 %  SOLN nasal spray  ?  Sig: Place 2 sprays into both nostrils as needed for congestion.  ?  Dispense:  480 mL  ?  Refill:  0  ?  ?I have reviewed the patients home medicines and have made adjustments as needed ? ?Problem List / ED Course: ?Problem List Items Addressed This Visit   ?None ?Visit Diagnoses   ? ? Nasal congestion    -  Primary  ? Seasonal allergies      ? ?  ?  ? ? ? ? ? ? ? ? ? ? ? ? ?Final Clinical Impression(s) / ED Diagnoses ?Final diagnoses:  ?Nasal congestion  ?Seasonal allergies  ? ? ?Rx / DC Orders ?ED Discharge Orders   ? ?      Ordered  ?  fluticasone (FLONASE) 50 MCG/ACT nasal spray  Daily       ? 03/18/22 0113  ?  sodium chloride (OCEAN) 0.65 % SOLN nasal spray  As needed       ? 03/18/22 0113  ? ?  ?  ? ?  ? ? ?  ?Shon Baton, MD ?03/18/22 0122 ? ?

## 2022-03-18 NOTE — Discharge Instructions (Signed)
You were seen today for nasal congestion.  This is likely related to seasonal allergies.  Continue Zyrtec.  Add Flonase and nasal saline.  You were given a dose of steroids which should help as well. ?

## 2022-05-24 ENCOUNTER — Other Ambulatory Visit (HOSPITAL_COMMUNITY)
Admission: RE | Admit: 2022-05-24 | Discharge: 2022-05-24 | Disposition: A | Discharge status: Home / Self Care | Payer: 59 | Source: Ambulatory Visit | Attending: Obstetrics | Admitting: Obstetrics

## 2022-05-24 ENCOUNTER — Ambulatory Visit (INDEPENDENT_AMBULATORY_CARE_PROVIDER_SITE_OTHER): Payer: 59 | Admitting: *Deleted

## 2022-05-24 VITALS — BP 115/73 | HR 79

## 2022-05-24 DIAGNOSIS — R300 Dysuria: Secondary | ICD-10-CM | POA: Diagnosis not present

## 2022-05-24 DIAGNOSIS — Z113 Encounter for screening for infections with a predominantly sexual mode of transmission: Secondary | ICD-10-CM | POA: Diagnosis not present

## 2022-05-24 DIAGNOSIS — N760 Acute vaginitis: Secondary | ICD-10-CM | POA: Insufficient documentation

## 2022-05-24 DIAGNOSIS — R69 Illness, unspecified: Secondary | ICD-10-CM | POA: Diagnosis not present

## 2022-05-24 NOTE — Progress Notes (Cosign Needed)
SUBJECTIVE:  31 y.o. female complains of no vaginal discharge for 0 day(s) and dysuria. Request STI screening and Urine Culture. Denies abnormal vaginal bleeding or significant pelvic pain or fever.Denies history of known exposure to STD.  No LMP recorded.  OBJECTIVE:  She appears alert, well appearing, in no apparent distress, oriented to person, place and time Urine dipstick: not done.  ASSESSMENT:  Vaginal Discharge  Vaginal Odor Dysuria    PLAN:  GC, chlamydia, trichomonas, BVAG, CVAG probe and urine culture sent to lab. Treatment: To be determined once lab results are received ROV prn if symptoms persist or worsen.

## 2022-05-24 NOTE — Progress Notes (Signed)
Agree with nurses's documentation of this patient's clinic encounter.  Anali Cabanilla L, MD  

## 2022-05-25 LAB — HIV ANTIBODY (ROUTINE TESTING W REFLEX): HIV Screen 4th Generation wRfx: NONREACTIVE

## 2022-05-25 LAB — HEPATITIS B SURFACE ANTIGEN: Hepatitis B Surface Ag: NEGATIVE

## 2022-05-25 LAB — HEPATITIS C ANTIBODY: Hep C Virus Ab: NONREACTIVE

## 2022-05-25 LAB — RPR: RPR Ser Ql: NONREACTIVE

## 2022-05-26 LAB — URINE CULTURE

## 2022-05-27 LAB — CERVICOVAGINAL ANCILLARY ONLY
Bacterial Vaginitis (gardnerella): POSITIVE — AB
Candida Glabrata: NEGATIVE
Candida Vaginitis: NEGATIVE
Chlamydia: NEGATIVE
Comment: NEGATIVE
Comment: NEGATIVE
Comment: NEGATIVE
Comment: NEGATIVE
Comment: NEGATIVE
Comment: NORMAL
Neisseria Gonorrhea: NEGATIVE
Trichomonas: NEGATIVE

## 2022-05-28 ENCOUNTER — Other Ambulatory Visit: Payer: Self-pay | Admitting: *Deleted

## 2022-05-28 DIAGNOSIS — B9689 Other specified bacterial agents as the cause of diseases classified elsewhere: Secondary | ICD-10-CM

## 2022-05-28 MED ORDER — METRONIDAZOLE 500 MG PO TABS: 500.0000 mg | ORAL_TABLET | Freq: Two times a day (BID) | ORAL | 0 refills | Status: DC

## 2022-05-28 NOTE — Progress Notes (Signed)
RX Flagyl sent per protocol.

## 2022-07-16 ENCOUNTER — Ambulatory Visit: Payer: Self-pay | Admitting: Obstetrics and Gynecology

## 2022-07-16 ENCOUNTER — Other Ambulatory Visit (HOSPITAL_COMMUNITY)
Admission: RE | Admit: 2022-07-16 | Discharge: 2022-07-16 | Disposition: A | Discharge status: Home / Self Care | Payer: 59 | Source: Ambulatory Visit | Attending: Obstetrics and Gynecology | Admitting: Obstetrics and Gynecology

## 2022-07-16 ENCOUNTER — Ambulatory Visit (INDEPENDENT_AMBULATORY_CARE_PROVIDER_SITE_OTHER): Payer: 59 | Admitting: Emergency Medicine

## 2022-07-16 ENCOUNTER — Ambulatory Visit: Payer: Self-pay | Admitting: Obstetrics

## 2022-07-16 VITALS — BP 109/76 | Ht 63.0 in | Wt 175.5 lb

## 2022-07-16 DIAGNOSIS — R300 Dysuria: Secondary | ICD-10-CM

## 2022-07-16 DIAGNOSIS — N760 Acute vaginitis: Secondary | ICD-10-CM | POA: Insufficient documentation

## 2022-07-16 DIAGNOSIS — N898 Other specified noninflammatory disorders of vagina: Secondary | ICD-10-CM | POA: Diagnosis present

## 2022-07-16 LAB — POCT URINALYSIS DIPSTICK
Bilirubin, UA: 0.2
Glucose, UA: NEGATIVE
Leukocytes, UA: NEGATIVE
Protein, UA: NEGATIVE
Spec Grav, UA: 1.015 (ref 1.010–1.025)
Urobilinogen, UA: 0.2 E.U./dL
pH, UA: 6 (ref 5.0–8.0)

## 2022-07-16 NOTE — Addendum Note (Signed)
Addended by: Jearld Adjutant on: 07/16/2022 05:11 PM   Modules accepted: Orders

## 2022-07-16 NOTE — Progress Notes (Signed)
SUBJECTIVE:  31 y.o. female complains of clear vaginal discharge for 1 week(s). Denies abnormal vaginal bleeding or significant pelvic pain or fever. No UTI symptoms. Denies history of known exposure to STD.   OBJECTIVE:  She appears well, afebrile. Urine dipstick: positive for protein.  ASSESSMENT:  Vaginal Discharge  Vaginal Odor Reports bladder pressure   PLAN:  GC, chlamydia, trichomonas, BVAG, CVAG probe sent to lab. Treatment: To be determined once lab results are received ROV prn if symptoms persist or worsen. Urine sent for culture.

## 2022-07-16 NOTE — Progress Notes (Signed)
error 

## 2022-07-18 LAB — CERVICOVAGINAL ANCILLARY ONLY
Bacterial Vaginitis (gardnerella): POSITIVE — AB
Candida Glabrata: NEGATIVE
Candida Vaginitis: NEGATIVE
Chlamydia: NEGATIVE
Comment: NEGATIVE
Comment: NEGATIVE
Comment: NEGATIVE
Comment: NEGATIVE
Comment: NEGATIVE
Comment: NORMAL
Neisseria Gonorrhea: NEGATIVE
Trichomonas: NEGATIVE

## 2022-07-22 ENCOUNTER — Other Ambulatory Visit: Payer: Self-pay | Admitting: *Deleted

## 2022-07-22 DIAGNOSIS — B9689 Other specified bacterial agents as the cause of diseases classified elsewhere: Secondary | ICD-10-CM

## 2022-07-22 MED ORDER — METRONIDAZOLE 500 MG PO TABS: 500.0000 mg | ORAL_TABLET | Freq: Two times a day (BID) | ORAL | 0 refills | Status: DC

## 2022-07-22 NOTE — Progress Notes (Signed)
Flagyl sent for BV.

## 2022-07-31 ENCOUNTER — Ambulatory Visit: Payer: Self-pay | Admitting: Family Medicine

## 2022-08-13 ENCOUNTER — Other Ambulatory Visit: Payer: Self-pay | Admitting: Obstetrics

## 2022-08-13 DIAGNOSIS — J4520 Mild intermittent asthma, uncomplicated: Secondary | ICD-10-CM

## 2022-09-03 ENCOUNTER — Other Ambulatory Visit (HOSPITAL_COMMUNITY)
Admission: RE | Admit: 2022-09-03 | Discharge: 2022-09-03 | Disposition: A | Discharge status: Home / Self Care | Payer: 59 | Source: Ambulatory Visit | Attending: Obstetrics | Admitting: Obstetrics

## 2022-09-03 ENCOUNTER — Ambulatory Visit (INDEPENDENT_AMBULATORY_CARE_PROVIDER_SITE_OTHER): Payer: 59 | Admitting: Family Medicine

## 2022-09-03 ENCOUNTER — Encounter: Payer: Self-pay | Admitting: Family Medicine

## 2022-09-03 VITALS — BP 138/87 | HR 82 | Wt 167.8 lb

## 2022-09-03 DIAGNOSIS — A609 Anogenital herpesviral infection, unspecified: Secondary | ICD-10-CM

## 2022-09-03 DIAGNOSIS — R69 Illness, unspecified: Secondary | ICD-10-CM | POA: Diagnosis not present

## 2022-09-03 DIAGNOSIS — Z113 Encounter for screening for infections with a predominantly sexual mode of transmission: Secondary | ICD-10-CM

## 2022-09-03 DIAGNOSIS — F331 Major depressive disorder, recurrent, moderate: Secondary | ICD-10-CM | POA: Insufficient documentation

## 2022-09-03 DIAGNOSIS — Z01419 Encounter for gynecological examination (general) (routine) without abnormal findings: Secondary | ICD-10-CM

## 2022-09-03 DIAGNOSIS — F419 Anxiety disorder, unspecified: Secondary | ICD-10-CM | POA: Diagnosis not present

## 2022-09-03 DIAGNOSIS — J4520 Mild intermittent asthma, uncomplicated: Secondary | ICD-10-CM

## 2022-09-03 MED ORDER — ALBUTEROL SULFATE HFA 108 (90 BASE) MCG/ACT IN AERS: 2.0000 | INHALATION_SPRAY | RESPIRATORY_TRACT | 1 refills | Status: DC | PRN

## 2022-09-03 MED ORDER — SERTRALINE HCL 25 MG PO TABS: 25.0000 mg | ORAL_TABLET | Freq: Every day | ORAL | 2 refills | Status: DC

## 2022-09-03 NOTE — Assessment & Plan Note (Signed)
99214 - begin Zoloft, usual onset of action, usual dosing, discussed.

## 2022-09-03 NOTE — Assessment & Plan Note (Signed)
63893 - begin Zoloft

## 2022-09-03 NOTE — Progress Notes (Signed)
Pt presents for annual exam. Would like to discuss depression/anxiety meds.  PHQ positive. GAD positive.

## 2022-09-03 NOTE — Progress Notes (Signed)
Subjective:     Lauren Conway is a 31 y.o. female and is here for a comprehensive physical exam. The patient reports problems - needs her inhaler . Has some on-going issues with depression and anxiety.     The following portions of the patient's history were reviewed and updated as appropriate: allergies, current medications, past family history, past medical history, past social history, past surgical history, and problem list.  Review of Systems Pertinent items noted in HPI and remainder of comprehensive ROS otherwise negative.   Objective:    BP 138/87   Pulse 82   Wt 167 lb 12.8 oz (76.1 kg)   LMP 08/28/2022 (Exact Date)   BMI 29.72 kg/m  General appearance: alert, cooperative, and appears stated age Head: Normocephalic, without obvious abnormality, atraumatic Neck: no adenopathy, supple, symmetrical, trachea midline, and thyroid not enlarged, symmetric, no tenderness/mass/nodules Lungs: clear to auscultation bilaterally Breasts: normal appearance, no masses or tenderness Heart: regular rate and rhythm, S1, S2 normal, no murmur, click, rub or gallop Abdomen: soft, non-tender; bowel sounds normal; no masses,  no organomegaly Extremities: extremities normal, atraumatic, no cyanosis or edema Pulses: 2+ and symmetric Skin: Skin color, texture, turgor normal. No rashes or lesions Lymph nodes: Cervical, supraclavicular, and axillary nodes normal. Neurologic: Grossly normal    Assessment:    Healthy female exam.      Plan:   Problem List Items Addressed This Visit       Unprioritized   HSV (herpes simplex virus) anogenital infection    99214 - no recurrences, decline ppx      Anxiety    99214 - begin Zoloft, usual onset of action, usual dosing, discussed.      Relevant Medications   sertraline (ZOLOFT) 25 MG tablet   Asthma, chronic, mild intermittent, uncomplicated   Relevant Medications   albuterol (VENTOLIN HFA) 108 (90 Base) MCG/ACT inhaler   Moderate  episode of recurrent major depressive disorder ([place])    82505 - begin Zoloft       Relevant Medications   sertraline (ZOLOFT) 25 MG tablet   Other Visit Diagnoses     Routine screening for STI (sexually transmitted infection)    -  Primary   Relevant Orders   Cervicovaginal ancillary only   HIV Antibody (routine testing w rflx)   RPR   Hepatitis C antibody   Hepatitis B surface antigen   Encounter for gynecological examination without abnormal finding       Pap is not due until 2024   Mild intermittent asthma without complication       Relevant Medications   albuterol (VENTOLIN HFA) 108 (90 Base) MCG/ACT inhaler      Return in 4 weeks (on 10/01/2022) for virtual, a follow-up.    See After Visit Summary for Counseling Recommendations

## 2022-09-03 NOTE — Assessment & Plan Note (Signed)
21224 - no recurrences, decline ppx

## 2022-09-03 NOTE — Patient Instructions (Signed)

## 2022-09-04 LAB — HIV ANTIBODY (ROUTINE TESTING W REFLEX): HIV Screen 4th Generation wRfx: NONREACTIVE

## 2022-09-04 LAB — RPR: RPR Ser Ql: NONREACTIVE

## 2022-09-04 LAB — HEPATITIS C ANTIBODY: Hep C Virus Ab: NONREACTIVE

## 2022-09-04 LAB — CERVICOVAGINAL ANCILLARY ONLY
Chlamydia: NEGATIVE
Comment: NEGATIVE
Comment: NEGATIVE
Comment: NORMAL
Neisseria Gonorrhea: NEGATIVE
Trichomonas: NEGATIVE

## 2022-09-04 LAB — HEPATITIS B SURFACE ANTIGEN: Hepatitis B Surface Ag: NEGATIVE

## 2022-10-01 ENCOUNTER — Telehealth: Payer: 59 | Admitting: Obstetrics and Gynecology

## 2022-10-01 ENCOUNTER — Encounter: Payer: Self-pay | Admitting: *Deleted

## 2022-10-01 NOTE — Progress Notes (Signed)
Unable to reach patient by phone for mychart visit. Unable to leave voicemail message

## 2022-10-27 IMAGING — CR DG TOE GREAT 2+V*R*
3 series · 3 of 3 positions shown · non-contrast
Comparison: None.

CLINICAL DATA: Stubbed toe.  Concern for fracture

EXAM:
RIGHT GREAT TOE

[toe ap]
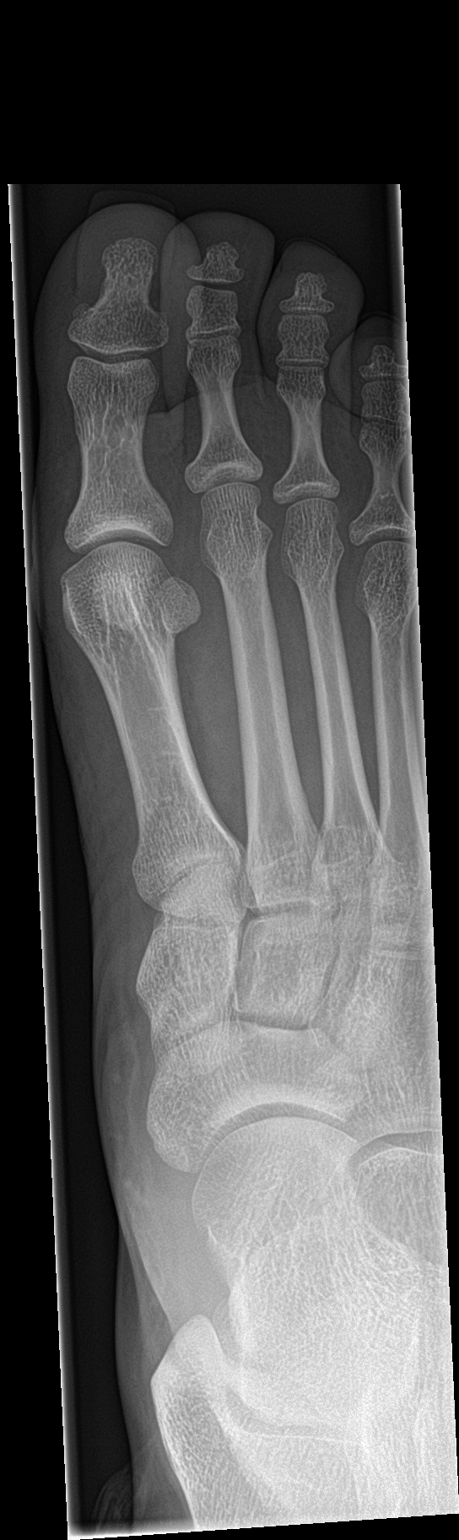

[toe obl]
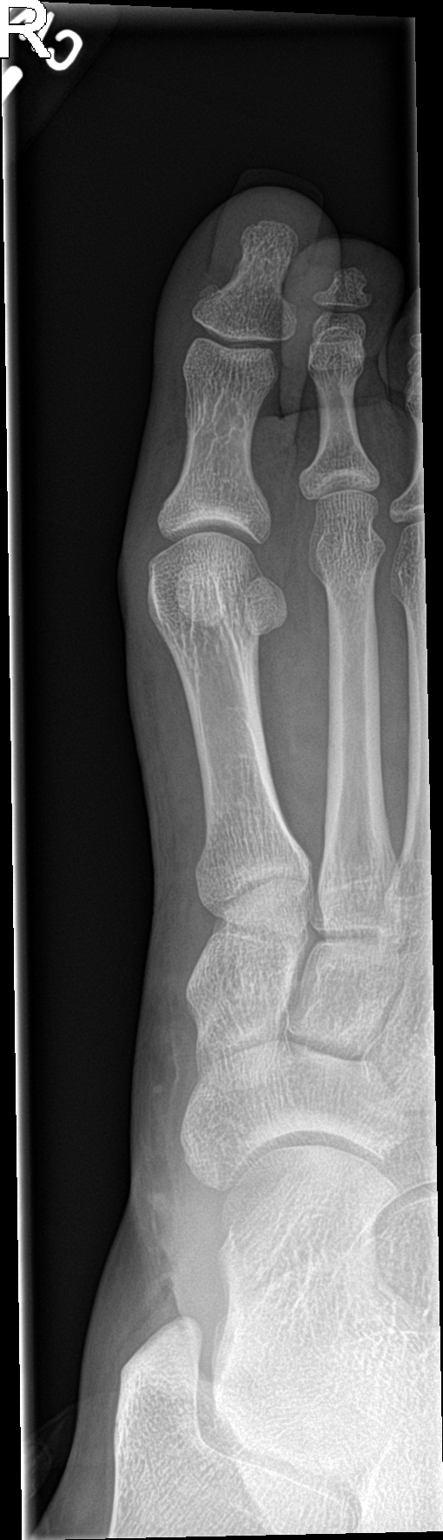

[toe lat]
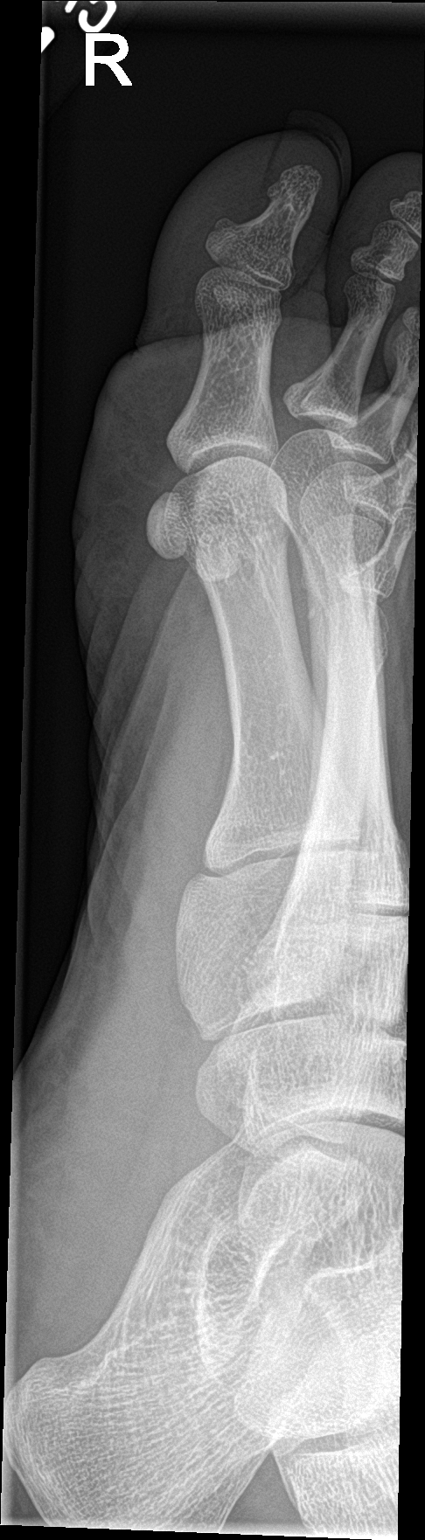

[3 of 3 positions shown; findings below may reference images not displayed]

FINDINGS: There is no evidence of fracture or dislocation. No opaque foreign
body.
IMPRESSION: Negative.

## 2022-11-01 ENCOUNTER — Ambulatory Visit (INDEPENDENT_AMBULATORY_CARE_PROVIDER_SITE_OTHER): Payer: 59 | Admitting: Obstetrics

## 2022-11-01 ENCOUNTER — Encounter: Payer: Self-pay | Admitting: Obstetrics

## 2022-11-01 ENCOUNTER — Other Ambulatory Visit (HOSPITAL_COMMUNITY)
Admission: RE | Admit: 2022-11-01 | Discharge: 2022-11-01 | Disposition: A | Discharge status: Home / Self Care | Payer: 59 | Source: Ambulatory Visit | Attending: Obstetrics | Admitting: Obstetrics

## 2022-11-01 VITALS — BP 124/79 | HR 74 | Ht 64.0 in | Wt 166.0 lb

## 2022-11-01 DIAGNOSIS — N898 Other specified noninflammatory disorders of vagina: Secondary | ICD-10-CM | POA: Insufficient documentation

## 2022-11-01 DIAGNOSIS — R69 Illness, unspecified: Secondary | ICD-10-CM | POA: Diagnosis not present

## 2022-11-01 DIAGNOSIS — J4520 Mild intermittent asthma, uncomplicated: Secondary | ICD-10-CM | POA: Diagnosis not present

## 2022-11-01 DIAGNOSIS — Z01419 Encounter for gynecological examination (general) (routine) without abnormal findings: Secondary | ICD-10-CM | POA: Insufficient documentation

## 2022-11-01 DIAGNOSIS — R102 Pelvic and perineal pain: Secondary | ICD-10-CM | POA: Diagnosis not present

## 2022-11-01 DIAGNOSIS — Z113 Encounter for screening for infections with a predominantly sexual mode of transmission: Secondary | ICD-10-CM

## 2022-11-01 DIAGNOSIS — N760 Acute vaginitis: Secondary | ICD-10-CM

## 2022-11-01 DIAGNOSIS — B9689 Other specified bacterial agents as the cause of diseases classified elsewhere: Secondary | ICD-10-CM | POA: Diagnosis not present

## 2022-11-01 LAB — POCT URINALYSIS DIPSTICK
Blood, UA: NEGATIVE
Glucose, UA: NEGATIVE
Ketones, UA: NEGATIVE
Leukocytes, UA: NEGATIVE
Nitrite, UA: NEGATIVE
Protein, UA: POSITIVE — AB
Spec Grav, UA: >=1.03 — AB (ref 1.010–1.025)
Urobilinogen, UA: 0.2 E.U./dL
pH, UA: 6 (ref 5.0–8.0)

## 2022-11-01 LAB — POCT URINE PREGNANCY: Preg Test, Ur: NEGATIVE

## 2022-11-01 MED ORDER — CETIRIZINE HCL 10 MG PO TABS: 10.0000 mg | ORAL_TABLET | Freq: Every day | ORAL | 11 refills | Status: DC

## 2022-11-01 MED ORDER — IBUPROFEN 800 MG PO TABS: 800.0000 mg | ORAL_TABLET | Freq: Three times a day (TID) | ORAL | 5 refills | Status: DC | PRN

## 2022-11-01 MED ORDER — METRONIDAZOLE 500 MG PO TABS: 500.0000 mg | ORAL_TABLET | Freq: Two times a day (BID) | ORAL | 2 refills | Status: DC

## 2022-11-01 MED ORDER — ALBUTEROL SULFATE HFA 108 (90 BASE) MCG/ACT IN AERS: 2.0000 | INHALATION_SPRAY | RESPIRATORY_TRACT | 1 refills | Status: DC | PRN

## 2022-11-01 NOTE — Progress Notes (Signed)
31 y.o GYN presents for AEX/PAP.  C/o pelvic pain 5/10, vaginal odor, white discharge, nausea x 3 days.

## 2022-11-01 NOTE — Progress Notes (Signed)
Subjective:        Lauren Conway is a 31 y.o. female here for a routine exam.  Current complaints: Pelvic pain, and malodorous vaginal discharge.    Personal health questionnaire:  Is patient Ashkenazi Jewish, have a family history of breast and/or ovarian cancer: yes Is there a family history of uterine cancer diagnosed at age < 80, gastrointestinal cancer, urinary tract cancer, family member who is a Personnel officer syndrome-associated carrier: no Is the patient overweight and hypertensive, family history of diabetes, personal history of gestational diabetes, preeclampsia or PCOS: no Is patient over 23, have PCOS,  family history of premature CHD under age 21, diabetes, smoke, have hypertension or peripheral artery disease:  no At any time, has a partner hit, kicked or otherwise hurt or frightened you?: nono Over the past 2 weeks, have you felt down, depressed or hopeless?: no Over the past 2 weeks, have you felt little interest or pleasure in doing things?:   Gynecologic History No LMP recorded. Contraception: tubal ligation Last Pap: 2021. Results were: normal Last mammogram: n/a. Results were: n/a  Obstetric History OB History  Gravida Para Term Preterm AB Living  6 4 3 1 2 3   SAB IAB Ectopic Multiple Live Births  2 0 0 0 3    # Outcome Date GA Lbr Len/2nd Weight Sex Delivery Anes PTL Lv  6 Term 10/28/17 [redacted]w[redacted]d   F Vag-Spont   LIV  5 SAB 01/2017 [redacted]w[redacted]d            Birth Comments: no complication  4 Term 08/09/15 [redacted]w[redacted]d 51:15 / 00:18 7 lb 11.6 oz (3.504 kg) M Vag-Spont EPI  LIV  3 Term 03/19/13 [redacted]w[redacted]d 14:40 / 00:34 6 lb 5.6 oz (2.88 kg) F Vag-Spont EPI  LIV     Birth Comments: umbilical hernia  2 SAB 2013 [redacted]w[redacted]d         1 Preterm             Past Medical History:  Diagnosis Date   Anemia    Anxiety    Asthma    uses inhaler, last used 8/22   Depression    Eczema    History of chlamydia    12/2008; 03/09/2009   History of gonorrhea    12/2008; 09/2010;    HSV-1 (herpes  simplex virus 1) infection 2016   vaginal   PID (pelvic inflammatory disease)     Past Surgical History:  Procedure Laterality Date   TUBAL LIGATION N/A 10/29/2017   Procedure: POST PARTUM TUBAL LIGATION;  Surgeon: 10/31/2017, MD;  Location: [facility];  Service: Gynecology;  Laterality: N/A;     Current Outpatient Medications:    albuterol (VENTOLIN HFA) 108 (90 Base) MCG/ACT inhaler, Inhale 2 puffs into the lungs every 4 (four) hours as needed for wheezing or shortness of breath., Disp: 6.7 each, Rfl: 1   sertraline (ZOLOFT) 25 MG tablet, Take 1 tablet (25 mg total) by mouth daily., Disp: 90 tablet, Rfl: 2   valACYclovir (VALTREX) 500 MG tablet, TAKE 1 TABLET (500 MG) BY MOUTH EVERY DAY TAKE TWICE A DAY FOR 3 DAYS FOR FLARE, Disp: 36 tablet, Rfl: PRN Allergies  Allergen Reactions   Eggs Or Egg-Derived Products Hives and Swelling   Latex Itching and Swelling   Pecan Nut (Diagnostic) Hives    Social History   Tobacco Use   Smoking status: Every Day    Packs/day: 0.00    Years: 0.50    Total pack  years: 0.00    Types: Cigarettes    Last attempt to quit: 03/16/2017    Years since quitting: 5.6   Smokeless tobacco: Current  Substance Use Topics   Alcohol use: Yes    Alcohol/week: 0.0 standard drinks of alcohol    Comment: occassionally    Family History  Problem Relation Age of Onset   Hypertension Mother    Depression Mother    Breast cancer Paternal Grandmother    Diabetes Paternal Grandmother    Asthma Maternal Grandmother    Other Neg Hx       Review of Systems  Constitutional: negative for fatigue and weight loss Respiratory: negative for cough and wheezing Cardiovascular: negative for chest pain, fatigue and palpitations Gastrointestinal: negative for abdominal pain and change in bowel habits Musculoskeletal:negative for myalgias Neurological: negative for gait problems and tremors Behavioral/Psych: negative for abusive relationship,  depression Endocrine: negative for temperature intolerance    Genitourinary: positive for pelvic pain and malodorous vaginal discharge.  negative for abnormal menstrual periods, genital lesions, hot flashes, sexual problems  Integument/breast: negative for breast lump, breast tenderness, nipple discharge and skin lesion(s)    Objective:       BP 124/79   Pulse 74   Ht 5\' 4"  (1.626 m)   Wt 166 lb (75.3 kg)   BMI 28.49 kg/m  General:   Alert and no distress  Skin:   no rash or abnormalities  Lungs:   clear to auscultation bilaterally  Heart:   regular rate and rhythm, S1, S2 normal, no murmur, click, rub or gallop  Breasts:   normal without suspicious masses, skin or nipple changes or axillary nodes  Abdomen:  normal findings: no organomegaly, soft, non-tender and no hernia  Pelvis:  External genitalia: normal general appearance Urinary system: urethral meatus normal and bladder without fullness, nontender Vaginal: normal without tenderness, induration or masses Cervix: normal appearance Adnexa: normal bimanual exam Uterus: anteverted and tender, normal size   Lab Review Urine pregnancy test Labs reviewed yes Radiologic studies reviewed no  I have spent a total of 20 minutes of face-to-face time, excluding clinical staff time, reviewing notes and preparing to see patient, ordering tests and/or medications, and counseling the patient.   Assessment:   1. Encounter for gynecological examination with Papanicolaou smear of cervix Rx: - POCT Urinalysis Dipstick - Cytology - PAP( [place]) - POCT urine pregnancy  2. Pelvic pain Rx: - PELVIC COMPLETE WITH TRANSVAGINAL; Future - ibuprofen (ADVIL) 800 MG tablet; Take 1 tablet (800 mg total) by mouth every 8 (eight) hours as needed.  Dispense: 30 tablet; Refill: 5  3. Vaginal discharge Rx: - Cervicovaginal ancillary only( [place])  4. BV (bacterial vaginosis) Rx: - metroNIDAZOLE (FLAGYL) 500 MG tablet; Take 1 tablet  (500 mg total) by mouth 2 (two) times daily.  Dispense: 14 tablet; Refill: 2  5. Screen for STD (sexually transmitted disease) Rx: - HIV Antibody (routine testing w rflx) - Hepatitis B surface antigen - RPR - Hepatitis C antibody  6. Asthma, chronic, mild intermittent, uncomplicated Rx: - cetirizine (ZYRTEC ALLERGY) 10 MG tablet; Take 1 tablet (10 mg total) by mouth daily.  Dispense: 30 tablet; Refill: 11 - albuterol (VENTOLIN HFA) 108 (90 Base) MCG/ACT inhaler; Inhale 2 puffs into the lungs every 4 (four) hours as needed for wheezing or shortness of breath.  Dispense: 6.7 each; Refill: 1    Plan:    Education reviewed: calcium supplements, depression evaluation, low fat, low cholesterol diet, safe  sex/STD prevention, self breast exams, and weight bearing exercise. Follow up in: 1 year.    Orders Placed This Encounter  Procedures   POCT Urinalysis Dipstick   POCT urine pregnancy    Brock Bad, MD 11/01/2022 10:04 AM

## 2022-11-02 LAB — HEPATITIS C ANTIBODY: Hep C Virus Ab: NONREACTIVE

## 2022-11-02 LAB — RPR: RPR Ser Ql: NONREACTIVE

## 2022-11-02 LAB — HIV ANTIBODY (ROUTINE TESTING W REFLEX): HIV Screen 4th Generation wRfx: NONREACTIVE

## 2022-11-02 LAB — HEPATITIS B SURFACE ANTIGEN: Hepatitis B Surface Ag: NEGATIVE

## 2022-11-04 ENCOUNTER — Ambulatory Visit: Payer: 59

## 2022-11-04 ENCOUNTER — Ambulatory Visit (HOSPITAL_BASED_OUTPATIENT_CLINIC_OR_DEPARTMENT_OTHER): Admission: RE | Admit: 2022-11-04 | Payer: 59 | Source: Ambulatory Visit

## 2022-11-04 LAB — CYTOLOGY - PAP
Comment: NEGATIVE
Diagnosis: NEGATIVE
High risk HPV: NEGATIVE

## 2022-11-04 LAB — CERVICOVAGINAL ANCILLARY ONLY
Bacterial Vaginitis (gardnerella): POSITIVE — AB
Candida Glabrata: NEGATIVE
Candida Vaginitis: NEGATIVE
Chlamydia: NEGATIVE
Comment: NEGATIVE
Comment: NEGATIVE
Comment: NEGATIVE
Comment: NEGATIVE
Comment: NEGATIVE
Comment: NORMAL
Neisseria Gonorrhea: NEGATIVE
Trichomonas: NEGATIVE

## 2022-11-05 ENCOUNTER — Other Ambulatory Visit: Payer: Self-pay | Admitting: Obstetrics

## 2022-11-05 NOTE — Progress Notes (Signed)
TC. No answer. Number on file is not in service. MyChart message sent advising pt of BV and RX sent.

## 2022-11-06 NOTE — Progress Notes (Signed)
Pt has read message in MyChart regarding BV and RX.

## 2022-11-18 ENCOUNTER — Other Ambulatory Visit: Payer: Self-pay

## 2022-11-18 ENCOUNTER — Encounter (HOSPITAL_BASED_OUTPATIENT_CLINIC_OR_DEPARTMENT_OTHER): Payer: Self-pay

## 2022-11-18 ENCOUNTER — Emergency Department (HOSPITAL_BASED_OUTPATIENT_CLINIC_OR_DEPARTMENT_OTHER): Payer: 59

## 2022-11-18 ENCOUNTER — Emergency Department (HOSPITAL_BASED_OUTPATIENT_CLINIC_OR_DEPARTMENT_OTHER)
Admission: EM | Admit: 2022-11-18 | Discharge: 2022-11-18 | Disposition: A | Discharge status: Home / Self Care | Payer: 59 | Attending: Emergency Medicine | Admitting: Emergency Medicine

## 2022-11-18 DIAGNOSIS — N938 Other specified abnormal uterine and vaginal bleeding: Secondary | ICD-10-CM | POA: Diagnosis not present

## 2022-11-18 DIAGNOSIS — Z91040 Latex allergy status: Secondary | ICD-10-CM | POA: Diagnosis not present

## 2022-11-18 DIAGNOSIS — D251 Intramural leiomyoma of uterus: Secondary | ICD-10-CM | POA: Diagnosis not present

## 2022-11-18 DIAGNOSIS — N939 Abnormal uterine and vaginal bleeding, unspecified: Secondary | ICD-10-CM | POA: Diagnosis not present

## 2022-11-18 DIAGNOSIS — J45909 Unspecified asthma, uncomplicated: Secondary | ICD-10-CM | POA: Insufficient documentation

## 2022-11-18 DIAGNOSIS — Z87891 Personal history of nicotine dependence: Secondary | ICD-10-CM | POA: Insufficient documentation

## 2022-11-18 DIAGNOSIS — R109 Unspecified abdominal pain: Secondary | ICD-10-CM | POA: Diagnosis not present

## 2022-11-18 DIAGNOSIS — N9489 Other specified conditions associated with female genital organs and menstrual cycle: Secondary | ICD-10-CM | POA: Insufficient documentation

## 2022-11-18 LAB — COMPREHENSIVE METABOLIC PANEL
ALT: 7 U/L (ref 0–44)
AST: 16 U/L (ref 15–41)
Albumin: 4.3 g/dL (ref 3.5–5.0)
Alkaline Phosphatase: 65 U/L (ref 38–126)
Anion gap: 8 (ref 5–15)
BUN: 8 mg/dL (ref 6–20)
CO2: 26 mmol/L (ref 22–32)
Calcium: 9.3 mg/dL (ref 8.9–10.3)
Chloride: 105 mmol/L (ref 98–111)
Creatinine, Ser: 0.68 mg/dL (ref 0.44–1.00)
GFR, Estimated: >60 mL/min (ref >60)
Glucose, Bld: 91 mg/dL (ref 70–99)
Potassium: 3.7 mmol/L (ref 3.5–5.1)
Sodium: 139 mmol/L (ref 135–145)
Total Bilirubin: 0.4 mg/dL (ref 0.3–1.2)
Total Protein: 7.4 g/dL (ref 6.5–8.1)

## 2022-11-18 LAB — HCG, SERUM, QUALITATIVE: Preg, Serum: NEGATIVE

## 2022-11-18 LAB — LIPASE, BLOOD: Lipase: 23 U/L (ref 11–51)

## 2022-11-18 LAB — CBC
HCT: 33.4 % — ABNORMAL LOW (ref 36.0–46.0)
Hemoglobin: 11 g/dL — ABNORMAL LOW (ref 12.0–15.0)
MCH: 30.1 pg (ref 26.0–34.0)
MCHC: 32.9 g/dL (ref 30.0–36.0)
MCV: 91.3 fL (ref 80.0–100.0)
Platelets: 320 10*3/uL (ref 150–400)
RBC: 3.66 MIL/uL — ABNORMAL LOW (ref 3.87–5.11)
RDW: 13 % (ref 11.5–15.5)
WBC: 6.5 10*3/uL (ref 4.0–10.5)
nRBC: 0 % (ref 0.0–0.2)

## 2022-11-18 MED ORDER — NAPROXEN SODIUM 220 MG PO TABS
220.0000 mg | ORAL_TABLET | Freq: Two times a day (BID) | ORAL | 0 refills | Status: AC
Start: 2022-11-18 — End: 2022-11-25

## 2022-11-18 MED ORDER — PANTOPRAZOLE SODIUM 20 MG PO TBEC: 20.0000 mg | DELAYED_RELEASE_TABLET | Freq: Every day | ORAL | 0 refills | Status: DC

## 2022-11-18 NOTE — Discharge Instructions (Addendum)
It was a pleasure caring for you today in the emergency department.  Please return to the emergency department for any worsening or worrisome symptoms.  You should return to the hospital if you continue to have persistent and heavy vaginal bleeding, persistent pelvic pain not relieved by your prescribed medications, dizziness, shortness of breath, new and persistent fevers, other foul smelling discolored vaginal discharge, or for any other concerns.   Please return to the emergency department immediately for any new or concerning symptoms, or if you get worse.

## 2022-11-18 NOTE — ED Notes (Signed)
Pt reports that she is "hurting, aggravated, and ready to go." Pt states her pain has picked up since getting here and having the ultrasound, and her bleeding has also increased. Pt alert & oriented, nad noted.

## 2022-11-18 NOTE — ED Notes (Signed)
Pt discharged home after verbalizing understanding of discharge instructions; nad noted. 

## 2022-11-18 NOTE — ED Provider Notes (Signed)
[facility] EMERGENCY DEPT Provider Note   CSN: [id number] Arrival date & time: 11/18/22  6387     History {Add pertinent medical, surgical, social history, OB history to HPI:1} Chief Complaint  Patient presents with   Abdominal Pain    Lauren Conway is a 31 y.o. female.  Patient as above with significant medical history as below, including [id number], tubal ligation, PID, anemia who presents to the ED with complaint of vaginal bleeding/cramping.  Menses began Friday, a/w pelvic / low back cramping sensation. Passing more blood than normal Yesterday passed a large clot/blood passage Symptoms improved after this, having scant vaginal bleeding since yesterday Pain improved No n/v, no change to bowel/bladder function, no fevers or chills LMP prior to this week was 11/4 Was supposed to have [place] last month but unable to make appt 2/2 sick child Sees Dr Clearance Coots obgyn at [facility]     Past Medical History:  Diagnosis Date   Anemia    Anxiety    Asthma    uses inhaler, last used 8/22   Depression    Eczema    History of chlamydia    12/2008; 03/09/2009   History of gonorrhea    12/2008; 09/2010;    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   PID (pelvic inflammatory disease)     Past Surgical History:  Procedure Laterality Date   TUBAL LIGATION N/A 10/29/2017   Procedure: POST PARTUM TUBAL LIGATION;  Surgeon: Conan Bowens, MD;  Location: [facility] BIRTHING SUITES;  Service: Gynecology;  Laterality: N/A;      The history is provided by the patient. No language interpreter was used.  Abdominal Pain Associated symptoms: vaginal bleeding   Associated symptoms: no chest pain, no cough, no dysuria, no fever, no nausea and no shortness of breath        Home Medications Prior to Admission medications   Medication Sig Start Date End Date Taking? Authorizing Provider  albuterol (VENTOLIN HFA) 108 (90 Base) MCG/ACT inhaler Inhale 2 puffs into the lungs every  4 (four) hours as needed for wheezing or shortness of breath. 11/01/22  Yes Brock Bad, MD  cetirizine (ZYRTEC ALLERGY) 10 MG tablet Take 1 tablet (10 mg total) by mouth daily. 11/01/22  Yes Brock Bad, MD  ibuprofen (ADVIL) 800 MG tablet Take 1 tablet (800 mg total) by mouth every 8 (eight) hours as needed. 11/01/22  Yes Brock Bad, MD  valACYclovir (VALTREX) 500 MG tablet TAKE 1 TABLET (500 MG) BY MOUTH EVERY DAY TAKE TWICE A DAY FOR 3 DAYS FOR FLARE 09/04/21  Yes Brock Bad, MD  metroNIDAZOLE (FLAGYL) 500 MG tablet Take 1 tablet (500 mg total) by mouth 2 (two) times daily. 11/01/22   Brock Bad, MD  sertraline (ZOLOFT) 25 MG tablet Take 1 tablet (25 mg total) by mouth daily. 09/03/22   Reva Bores, MD      Allergies    Eggs or egg-derived products, Latex, and Pecan nut (diagnostic)    Review of Systems   Review of Systems  Constitutional:  Negative for activity change and fever.  HENT:  Negative for facial swelling and trouble swallowing.   Eyes:  Negative for discharge and redness.  Respiratory:  Negative for cough and shortness of breath.   Cardiovascular:  Negative for chest pain and palpitations.  Gastrointestinal:  Positive for abdominal pain. Negative for nausea.  Genitourinary:  Positive for pelvic pain and vaginal bleeding. Negative for dysuria and  flank pain.  Musculoskeletal:  Positive for arthralgias and back pain. Negative for gait problem.  Skin:  Negative for pallor and rash.  Neurological:  Negative for syncope and headaches.    Physical Exam Updated Vital Signs BP 128/76 (BP Location: Right Arm)   Pulse 87   Temp 98.2 F (36.8 C) (Oral)   Resp 16   Ht 5\' 3"  (1.6 m)   Wt 75.3 kg   LMP 11/15/2022   SpO2 99%   BMI 29.41 kg/m  Physical Exam Vitals and nursing note reviewed.  Constitutional:      General: She is not in acute distress.    Appearance: Normal appearance. She is well-developed.  HENT:     Head: Normocephalic  and atraumatic.     Right Ear: External ear normal.     Left Ear: External ear normal.     Nose: Nose normal.     Mouth/Throat:     Mouth: Mucous membranes are moist.  Eyes:     General: No scleral icterus.       Right eye: No discharge.        Left eye: No discharge.  Cardiovascular:     Rate and Rhythm: Normal rate and regular rhythm.     Pulses: Normal pulses.     Heart sounds: Normal heart sounds.  Pulmonary:     Effort: Pulmonary effort is normal. No respiratory distress.     Breath sounds: Normal breath sounds.  Abdominal:     General: Abdomen is flat. Bowel sounds are normal.     Palpations: Abdomen is soft.     Tenderness: There is no abdominal tenderness. There is no guarding or rebound.  Musculoskeletal:        General: Normal range of motion.     Cervical back: Normal range of motion.     Right lower leg: No edema.     Left lower leg: No edema.  Skin:    General: Skin is warm and dry.     Capillary Refill: Capillary refill takes less than 2 seconds.  Neurological:     Mental Status: She is alert and oriented to person, place, and time.     GCS: GCS eye subscore is 4. GCS verbal subscore is 5. GCS motor subscore is 6.  Psychiatric:        Mood and Affect: Mood normal.        Behavior: Behavior normal.     ED Results / Procedures / Treatments   Labs (all labs ordered are listed, but only abnormal results are displayed) Labs Reviewed  CBC - Abnormal; Notable for the following components:      Result Value   RBC 3.66 (*)    Hemoglobin 11.0 (*)    HCT 33.4 (*)    All other components within normal limits  LIPASE, BLOOD  COMPREHENSIVE METABOLIC PANEL  HCG, SERUM, QUALITATIVE  URINALYSIS, ROUTINE W REFLEX MICROSCOPIC    EKG None  Radiology No results found.  Procedures Procedures  {Document cardiac monitor, telemetry assessment procedure when appropriate:1}  Medications Ordered in ED Medications - No data to display  ED Course/ Medical Decision  Making/ A&P                           Medical Decision Making Amount and/or Complexity of Data Reviewed Labs: ordered. Radiology: ordered.  Risk OTC drugs. Prescription drug management.   This patient presents to the ED with chief complaint(s)  of pelvic pain/vag bleeding with pertinent past medical history of as above which further complicates the presenting complaint. The complaint involves an extensive differential diagnosis and also carries with it a high risk of complications and morbidity.    The differential diagnosis includes but not limited to  Differential includes but is not exclusive to causes of vaginal bleeding such as threatened miscarriage, incomplete miscarriage, normal bleeding from an early trimester pregnancy, ectopic pregnancy, still birth, blighted ovum, normal menses, etc.  . Serious etiologies were considered.   The initial plan is to screening labs, [place]   Additional history obtained: Additional history obtained from  na Records reviewed Primary Care Documents and prior labs/imaging/home meds/ surgeries  Dx with BV last month  Independent labs interpretation:  The following labs were independently interpreted:  Hgb similar to her baseline No leukocytosis Preg neg Lipase wnl CMP wnl   Independent visualization of imaging: - I independently visualized the following imaging with scope of interpretation limited to determining acute life threatening conditions related to emergency care: pelvic US, which revealed ***  Cardiac monitoring was reviewed and interpreted by myself which shows NSR  Treatment and Reassessment: ***  Consultation: - Consulted or discussed management/test interpretation w/ external professional: na  Consideration for admission or further workup: Admission was considered ***  Social Determinants of health: Social History   Tobacco Use   Smoking status: Former    Packs/day: 0.00    Years: 0.50    Total pack years: 0.00     Types: Cigarettes    Quit date: 10/16/2022    Years since quitting: 0.0   Smokeless tobacco: Never  Vaping Use   Vaping Use: Never used  Substance Use Topics   Alcohol use: Yes    Alcohol/week: 0.0 standard drinks of alcohol    Comment: occassionally   Drug use: Not Currently    Types: Marijuana    Comment: no more      {Document critical care time when appropriate:1} {Document review of labs and clinical decision tools ie heart score, Chads2Vasc2 etc:1}  {Document your independent review of radiology images, and any outside records:1} {Document your discussion with family members, caretakers, and with consultants:1} {Document social determinants of health affecting pt's care:1} {Document your decision making why or why not admission, treatments were needed:1} Final Clinical Impression(s) / ED Diagnoses Final diagnoses:  None    Rx / DC Orders ED Discharge Orders     None

## 2022-11-18 NOTE — ED Notes (Addendum)
Pt given sprite and graham crackers per her request; ok's by Dr. Wallace Cullens.

## 2022-11-18 NOTE — ED Notes (Signed)
Edp at bedside °

## 2022-11-18 NOTE — ED Triage Notes (Signed)
Cycle Friday, increasing pain in abd and back, yesterday experienced large passage of blood and tissue yesterday. Has been experiencing abd pain and had vaginal ultrasound scheduled but has not gone yet.

## 2022-11-26 ENCOUNTER — Ambulatory Visit: Payer: 59 | Admitting: Obstetrics

## 2023-01-28 ENCOUNTER — Other Ambulatory Visit (HOSPITAL_COMMUNITY)
Admission: RE | Admit: 2023-01-28 | Discharge: 2023-01-28 | Disposition: A | Discharge status: Home / Self Care | Payer: 59 | Source: Ambulatory Visit | Attending: Obstetrics | Admitting: Obstetrics

## 2023-01-28 ENCOUNTER — Ambulatory Visit (INDEPENDENT_AMBULATORY_CARE_PROVIDER_SITE_OTHER): Payer: 59 | Admitting: Obstetrics

## 2023-01-28 ENCOUNTER — Encounter: Payer: Self-pay | Admitting: Obstetrics

## 2023-01-28 VITALS — BP 117/77 | HR 88 | Wt 163.8 lb

## 2023-01-28 DIAGNOSIS — B9689 Other specified bacterial agents as the cause of diseases classified elsewhere: Secondary | ICD-10-CM

## 2023-01-28 DIAGNOSIS — N898 Other specified noninflammatory disorders of vagina: Secondary | ICD-10-CM | POA: Insufficient documentation

## 2023-01-28 DIAGNOSIS — R69 Illness, unspecified: Secondary | ICD-10-CM | POA: Diagnosis not present

## 2023-01-28 DIAGNOSIS — N760 Acute vaginitis: Secondary | ICD-10-CM | POA: Diagnosis not present

## 2023-01-28 DIAGNOSIS — Z113 Encounter for screening for infections with a predominantly sexual mode of transmission: Secondary | ICD-10-CM

## 2023-01-28 MED ORDER — METRONIDAZOLE 1 % EX GEL: Freq: Two times a day (BID) | CUTANEOUS | 6 refills | Status: DC

## 2023-01-28 MED ORDER — METRONIDAZOLE 0.75 % VA GEL: 1.0000 | Freq: Two times a day (BID) | VAGINAL | 6 refills | Status: DC

## 2023-01-28 NOTE — Progress Notes (Signed)
Patient ID: Lauren Conway, female   DOB: [birth year], 32 y.o.   MRN: [id number]  Chief Complaint  Patient presents with   Fibroids    HPI Lauren Conway is a 32 y.o. female.  Complains of heavy periods, vaginal discharge with irritation.  Has a history of recurrent BV. 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   PID (pelvic inflammatory disease)     Past Surgical History:  Procedure Laterality Date   TUBAL LIGATION N/A 10/29/2017   Procedure: POST PARTUM TUBAL LIGATION;  Surgeon: Sloan Leiter, MD;  Location: [facility];  Service: Gynecology;  Laterality: N/A;    Family History  Problem Relation Age of Onset   Hypertension Mother    Depression Mother    Breast cancer Paternal Grandmother    Diabetes Paternal Grandmother    Asthma Maternal Grandmother    Other Neg Hx     Social History Social History   Tobacco Use   Smoking status: Former    Packs/day: 0.00    Years: 0.50    Total pack years: 0.00    Types: Cigarettes    Quit date: 10/16/2022    Years since quitting: 0.2    Passive exposure: Never   Smokeless tobacco: Never  Vaping Use   Vaping Use: Never used  Substance Use Topics   Alcohol use: Yes    Alcohol/week: 0.0 standard drinks of alcohol    Comment: occassionally   Drug use: Not Currently    Types: Marijuana    Comment: no more    Allergies  Allergen Reactions   Eggs Or Egg-Derived Products Hives and Swelling   Latex Itching and Swelling   Pecan Nut (Diagnostic) Hives    Current Outpatient Medications  Medication Sig Dispense Refill   metroNIDAZOLE (METROGEL) 0.75 % vaginal gel Place 1 Applicatorful vaginally 2 (two) times daily. Use after the period ends, monthly, for 6 months. 70 g 6    albuterol (VENTOLIN HFA) 108 (90 Base) MCG/ACT inhaler Inhale 2 puffs into the lungs every 4 (four) hours as needed for wheezing or shortness of breath. (Patient not taking: Reported on 01/28/2023) 6.7 each 1   cetirizine (ZYRTEC ALLERGY) 10 MG tablet Take 1 tablet (10 mg total) by mouth daily. (Patient not taking: Reported on 01/28/2023) 30 tablet 11   ibuprofen (ADVIL) 800 MG tablet Take 1 tablet (800 mg total) by mouth every 8 (eight) hours as needed. (Patient not taking: Reported on 01/28/2023) 30 tablet 5   pantoprazole (PROTONIX) 20 MG tablet Take 1 tablet (20 mg total) by mouth daily for 7 days. 7 tablet 0   sertraline (ZOLOFT) 25 MG tablet Take 1 tablet (25 mg total) by mouth daily. (Patient not taking: Reported on 01/28/2023) 90 tablet 2   valACYclovir (VALTREX) 500 MG tablet TAKE 1 TABLET (500 MG) BY MOUTH EVERY DAY TAKE TWICE A DAY FOR 3 DAYS FOR FLARE (Patient not taking: Reported on 01/28/2023) 36 tablet PRN   No current facility-administered medications for this visit.    Review of Systems Review of Systems Constitutional: negative for fatigue and weight loss Respiratory: negative for cough and wheezing Cardiovascular: negative for  chest pain, fatigue and palpitations Gastrointestinal: negative for abdominal pain and change in bowel habits Genitourinary: positive for heavy periods and vaginal discharge with irritarion Integument/breast: negative for nipple discharge Musculoskeletal:negative for myalgias Neurological: negative for gait problems and tremors Behavioral/Psych: negative for abusive relationship, depression Endocrine: negative for temperature intolerance      Blood pressure 117/77, pulse 88, weight 163 lb 12.8 oz (74.3 kg), last menstrual period 01/08/2023.  Physical Exam Physical Exam General:   Alert and no distress  Skin:   no rash or abnormalities  Lungs:   clear to auscultation bilaterally  Heart:   regular rate and rhythm, S1, S2 normal, no murmur, click, rub  or gallop  Breasts:   normal without suspicious masses, skin or nipple changes or axillary nodes  Abdomen:  normal findings: no organomegaly, soft, non-tender and no hernia  Pelvis:  External genitalia: normal general appearance Urinary system: urethral meatus normal and bladder without fullness, nontender Vaginal: normal without tenderness, induration or masses Cervix: normal appearance Adnexa: normal bimanual exam Uterus: anteverted and non-tender, normal size    I have spent a total of 20 minutes of face-to-face time, excluding clinical staff time, reviewing notes and preparing to see patient, ordering tests and/or medications, and counseling the patient.   Data Reviewed Labs  Assessment   1. Vaginal discharge Rx: - Cervicovaginal ancillary only( [place])  2. Vaginal irritation Rx: - Cervicovaginal ancillary only( [place])  3. BV (bacterial vaginosis)  Rx: - metroNIDAZOLE (METROGEL) 0.75 % vaginal gel; Place 1 Applicatorful vaginally 2 (two) times daily. Use after the period ends, monthly, for 6 months.  Dispense: 70 g; Refill: 6  4. Screen for STD (sexually transmitted disease) Rx: - Hepatitis B surface antigen - Hepatitis C antibody - HIV Antibody (routine testing w rflx) - RPR   Plan   Follow up in 6 months  Orders Placed This Encounter  Procedures   Hepatitis B surface antigen   Hepatitis C antibody   HIV Antibody (routine testing w rflx)   RPR   Meds ordered this encounter  Medications   DISCONTD: metroNIDAZOLE (METROGEL) 1 % gel    Sig: Apply topically 2 (two) times daily.    Dispense:  60 g    Refill:  6   metroNIDAZOLE (METROGEL) 0.75 % vaginal gel    Sig: Place 1 Applicatorful vaginally 2 (two) times daily. Use after the period ends, monthly, for 6 months.    Dispense:  70 g    Refill:  6      Shelly Bombard, MD 01/28/2023 1:16 PM

## 2023-01-28 NOTE — Progress Notes (Signed)
Pt presents to follow up fibroids and all STD screening.

## 2023-01-29 LAB — CERVICOVAGINAL ANCILLARY ONLY
Bacterial Vaginitis (gardnerella): NEGATIVE
Candida Glabrata: NEGATIVE
Candida Vaginitis: NEGATIVE
Chlamydia: NEGATIVE
Comment: NEGATIVE
Comment: NEGATIVE
Comment: NEGATIVE
Comment: NEGATIVE
Comment: NEGATIVE
Comment: NORMAL
Neisseria Gonorrhea: NEGATIVE
Trichomonas: NEGATIVE

## 2023-01-29 LAB — HEPATITIS B SURFACE ANTIGEN: Hepatitis B Surface Ag: NEGATIVE

## 2023-01-29 LAB — HEPATITIS C ANTIBODY: Hep C Virus Ab: NONREACTIVE

## 2023-01-29 LAB — HIV ANTIBODY (ROUTINE TESTING W REFLEX): HIV Screen 4th Generation wRfx: NONREACTIVE

## 2023-01-29 LAB — RPR: RPR Ser Ql: NONREACTIVE

## 2023-04-08 ENCOUNTER — Other Ambulatory Visit (HOSPITAL_COMMUNITY)
Admission: RE | Admit: 2023-04-08 | Discharge: 2023-04-08 | Disposition: A | Discharge status: Home / Self Care | Payer: 59 | Source: Ambulatory Visit | Attending: Obstetrics | Admitting: Obstetrics

## 2023-04-08 ENCOUNTER — Encounter: Payer: Self-pay | Admitting: Obstetrics

## 2023-04-08 ENCOUNTER — Ambulatory Visit (INDEPENDENT_AMBULATORY_CARE_PROVIDER_SITE_OTHER): Payer: 59 | Admitting: Obstetrics

## 2023-04-08 VITALS — BP 136/78 | HR 85 | Ht 63.0 in | Wt 162.7 lb

## 2023-04-08 DIAGNOSIS — Z8249 Family history of ischemic heart disease and other diseases of the circulatory system: Secondary | ICD-10-CM | POA: Diagnosis not present

## 2023-04-08 DIAGNOSIS — Z113 Encounter for screening for infections with a predominantly sexual mode of transmission: Secondary | ICD-10-CM

## 2023-04-08 DIAGNOSIS — Z720 Tobacco use: Secondary | ICD-10-CM | POA: Diagnosis not present

## 2023-04-08 DIAGNOSIS — Z803 Family history of malignant neoplasm of breast: Secondary | ICD-10-CM | POA: Diagnosis not present

## 2023-04-08 DIAGNOSIS — Z833 Family history of diabetes mellitus: Secondary | ICD-10-CM | POA: Diagnosis not present

## 2023-04-08 DIAGNOSIS — Z825 Family history of asthma and other chronic lower respiratory diseases: Secondary | ICD-10-CM | POA: Diagnosis not present

## 2023-04-08 DIAGNOSIS — N898 Other specified noninflammatory disorders of vagina: Secondary | ICD-10-CM | POA: Insufficient documentation

## 2023-04-08 DIAGNOSIS — Z818 Family history of other mental and behavioral disorders: Secondary | ICD-10-CM | POA: Diagnosis not present

## 2023-04-08 DIAGNOSIS — Z823 Family history of stroke: Secondary | ICD-10-CM | POA: Diagnosis not present

## 2023-04-08 DIAGNOSIS — J45909 Unspecified asthma, uncomplicated: Secondary | ICD-10-CM | POA: Diagnosis not present

## 2023-04-08 NOTE — Progress Notes (Signed)
Patient ID: Lauren Conway, female   DOB: [birth year], 32 y.o.   MRN: [id number]  Chief Complaint  Patient presents with   Exposure to STD    HPI Lauren Conway is a 32 y.o. female.  Complains of recurrent BV. 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   PID (pelvic inflammatory disease)     Past Surgical History:  Procedure Laterality Date   TUBAL LIGATION N/A 10/29/2017   Procedure: POST PARTUM TUBAL LIGATION;  Surgeon: Conan Bowens, MD;  Location: [facility] BIRTHING SUITES;  Service: Gynecology;  Laterality: N/A;    Family History  Problem Relation Age of Onset   Hypertension Mother    Depression Mother    Breast cancer Paternal Grandmother    Diabetes Paternal Grandmother    Asthma Maternal Grandmother    Other Neg Hx     Social History Social History   Tobacco Use   Smoking status: Former    Packs/day: 0.00    Years: 0.50    Additional pack years: 0.00    Total pack years: 0.00    Types: Cigarettes    Quit date: 10/16/2022    Years since quitting: 0.4    Passive exposure: Never   Smokeless tobacco: Never  Vaping Use   Vaping Use: Never used  Substance Use Topics   Alcohol use: Yes    Alcohol/week: 0.0 standard drinks of alcohol    Comment: occassionally   Drug use: Not Currently    Types: Marijuana    Comment: no more    Allergies  Allergen Reactions   Egg-Derived Products Hives and Swelling   Latex Itching and Swelling   Pecan Nut (Diagnostic) Hives    Current Outpatient Medications  Medication Sig Dispense Refill   albuterol (VENTOLIN HFA) 108 (90 Base) MCG/ACT inhaler Inhale 2 puffs into the lungs every 4 (four) hours as needed for wheezing or shortness of breath. (Patient not taking: Reported on 01/28/2023)  6.7 each 1   cetirizine (ZYRTEC ALLERGY) 10 MG tablet Take 1 tablet (10 mg total) by mouth daily. (Patient not taking: Reported on 01/28/2023) 30 tablet 11   ibuprofen (ADVIL) 800 MG tablet Take 1 tablet (800 mg total) by mouth every 8 (eight) hours as needed. (Patient not taking: Reported on 01/28/2023) 30 tablet 5   metroNIDAZOLE (METROGEL) 0.75 % vaginal gel Place 1 Applicatorful vaginally 2 (two) times daily. Use after the period ends, monthly, for 6 months. (Patient not taking: Reported on 04/08/2023) 70 g 6   pantoprazole (PROTONIX) 20 MG tablet Take 1 tablet (20 mg total) by mouth daily for 7 days. 7 tablet 0   sertraline (ZOLOFT) 25 MG tablet Take 1 tablet (25 mg total) by mouth daily. (Patient not taking: Reported on 01/28/2023) 90 tablet 2   valACYclovir (VALTREX) 500 MG tablet TAKE 1 TABLET (500 MG) BY MOUTH EVERY DAY TAKE TWICE A DAY FOR 3 DAYS FOR FLARE (Patient not taking: Reported on 01/28/2023) 36 tablet PRN   No current facility-administered medications for this visit.    Review of Systems Review of Systems Constitutional: negative for fatigue and weight loss Respiratory: negative for cough and wheezing Cardiovascular:  negative for chest pain, fatigue and palpitations Gastrointestinal: negative for abdominal pain and change in bowel habits Genitourinary: positive for malodorous vaginal discharge Integument/breast: negative for nipple discharge Musculoskeletal:negative for myalgias Neurological: negative for gait problems and tremors Behavioral/Psych: negative for abusive relationship, depression Endocrine: negative for temperature intolerance      Blood pressure 136/78, pulse 85, height  (1.6 m), weight 162 lb 11.2 oz (73.8 kg), last menstrual period 03/28/2023.  Physical Exam Physical Exam General:   Alert and no distress  Skin:   no rash or abnormalities  Lungs:   clear to auscultation bilaterally  Heart:   regular rate and rhythm, S1, S2 normal, no murmur, click,  rub or gallop  Breasts:   Not examined  Abdomen:  normal findings: no organomegaly, soft, non-tender and no hernia  Pelvis:  External genitalia: normal general appearance Urinary system: urethral meatus normal and bladder without fullness, nontender Vaginal: normal without tenderness, induration or masses Cervix: normal appearance Adnexa: normal bimanual exam Uterus: anteverted and non-tender, normal size    I have spent a total of 20 minutes of face-to-face time, excluding clinical staff time, reviewing notes and preparing to see patient, ordering tests and/or medications, and counseling the patient.   Data Reviewed Wet Prep  Assessment      1. Vaginal discharge Rx: - Cervicovaginal ancillary only( [place])  2. Screen for STD (sexually transmitted disease) Rx: - HIV Antibody (routine testing w rflx) - RPR - Hepatitis B Surface AntiGEN - Hepatitis C Antibody   Plan   Follow up in 2 weeks  Orders Placed This Encounter  Procedures   HIV Antibody (routine testing w rflx)   RPR   Hepatitis B Surface AntiGEN   Hepatitis C Antibody    Brock Bad, MD 04/08/2023 2:46 PM

## 2023-04-08 NOTE — Progress Notes (Signed)
Pt presents for routine STD screen and blood work.  Denies symptoms today.

## 2023-04-09 LAB — HEPATITIS B SURFACE ANTIGEN: Hepatitis B Surface Ag: NEGATIVE

## 2023-04-09 LAB — RPR: RPR Ser Ql: NONREACTIVE

## 2023-04-09 LAB — HIV ANTIBODY (ROUTINE TESTING W REFLEX): HIV Screen 4th Generation wRfx: NONREACTIVE

## 2023-04-09 LAB — HEPATITIS C ANTIBODY: Hep C Virus Ab: NONREACTIVE

## 2023-04-10 ENCOUNTER — Other Ambulatory Visit: Payer: Self-pay | Admitting: Obstetrics

## 2023-04-10 DIAGNOSIS — N760 Acute vaginitis: Secondary | ICD-10-CM

## 2023-04-10 LAB — CERVICOVAGINAL ANCILLARY ONLY
Bacterial Vaginitis (gardnerella): POSITIVE — AB
Candida Glabrata: NEGATIVE
Candida Vaginitis: NEGATIVE
Chlamydia: NEGATIVE
Comment: NEGATIVE
Comment: NEGATIVE
Comment: NEGATIVE
Comment: NEGATIVE
Comment: NEGATIVE
Comment: NORMAL
Neisseria Gonorrhea: NEGATIVE
Trichomonas: NEGATIVE

## 2023-04-10 MED ORDER — METRONIDAZOLE 0.75 % VA GEL: 1.0000 | Freq: Two times a day (BID) | VAGINAL | 6 refills | Status: DC

## 2023-04-11 ENCOUNTER — Telehealth: Payer: Self-pay

## 2023-04-11 NOTE — Telephone Encounter (Signed)
S/w pt and advised of results and rx 

## 2023-05-06 DIAGNOSIS — Z0100 Encounter for examination of eyes and vision without abnormal findings: Secondary | ICD-10-CM | POA: Diagnosis not present

## 2023-05-06 DIAGNOSIS — H04123 Dry eye syndrome of bilateral lacrimal glands: Secondary | ICD-10-CM | POA: Diagnosis not present

## 2023-05-21 ENCOUNTER — Other Ambulatory Visit: Payer: Self-pay | Admitting: Gastroenterology

## 2023-05-21 DIAGNOSIS — F17200 Nicotine dependence, unspecified, uncomplicated: Secondary | ICD-10-CM | POA: Diagnosis not present

## 2023-05-21 DIAGNOSIS — K921 Melena: Secondary | ICD-10-CM | POA: Diagnosis not present

## 2023-05-21 DIAGNOSIS — K219 Gastro-esophageal reflux disease without esophagitis: Secondary | ICD-10-CM

## 2023-05-21 DIAGNOSIS — R1010 Upper abdominal pain, unspecified: Secondary | ICD-10-CM | POA: Diagnosis not present

## 2023-06-17 ENCOUNTER — Ambulatory Visit
Admission: RE | Admit: 2023-06-17 | Discharge: 2023-06-17 | Disposition: A | Discharge status: Home / Self Care | Payer: 59 | Source: Ambulatory Visit | Attending: Gastroenterology | Admitting: Gastroenterology

## 2023-06-17 DIAGNOSIS — K219 Gastro-esophageal reflux disease without esophagitis: Secondary | ICD-10-CM

## 2023-06-17 DIAGNOSIS — R1010 Upper abdominal pain, unspecified: Secondary | ICD-10-CM

## 2023-08-04 ENCOUNTER — Other Ambulatory Visit (HOSPITAL_COMMUNITY)
Admission: RE | Admit: 2023-08-04 | Discharge: 2023-08-04 | Disposition: A | Discharge status: Home / Self Care | Payer: 59 | Source: Ambulatory Visit | Attending: Obstetrics and Gynecology | Admitting: Obstetrics and Gynecology

## 2023-08-04 ENCOUNTER — Ambulatory Visit (INDEPENDENT_AMBULATORY_CARE_PROVIDER_SITE_OTHER): Payer: 59 | Admitting: Emergency Medicine

## 2023-08-04 VITALS — BP 129/85 | HR 88 | Ht 64.0 in | Wt 165.0 lb

## 2023-08-04 DIAGNOSIS — Z113 Encounter for screening for infections with a predominantly sexual mode of transmission: Secondary | ICD-10-CM

## 2023-08-04 DIAGNOSIS — R102 Pelvic and perineal pain: Secondary | ICD-10-CM

## 2023-08-04 LAB — POCT URINALYSIS DIPSTICK
Bilirubin, UA: NEGATIVE
Glucose, UA: NEGATIVE
Ketones, UA: NEGATIVE
Leukocytes, UA: NEGATIVE
Nitrite, UA: NEGATIVE
Protein, UA: NEGATIVE
Spec Grav, UA: 1.025 (ref 1.010–1.025)
Urobilinogen, UA: 0.2 E.U./dL
pH, UA: 6 (ref 5.0–8.0)

## 2023-08-04 NOTE — Progress Notes (Signed)
SUBJECTIVE:  32 y.o. female desires routine STD screen. Denies abnormal discharge, odor, itching, vaginal bleeding or significant pelvic pain or fever. Endorses mild lower abdominal discomfort and low back pain. Denies history of known exposure to STD.  No LMP recorded.  OBJECTIVE:  She appears well, afebrile. Urine dipstick: negative for all components.  ASSESSMENT:  Vaginal Discharge  Vaginal Odor   PLAN:  GC, chlamydia, trichomonas, BVAG, CVAG probe sent to lab. Blood work collected. Urine culture sent to lab. Treatment: To be determined once lab results are received ROV prn if symptoms persist or worsen.

## 2023-08-05 LAB — CERVICOVAGINAL ANCILLARY ONLY
Bacterial Vaginitis (gardnerella): NEGATIVE
Candida Glabrata: NEGATIVE
Candida Vaginitis: NEGATIVE
Chlamydia: NEGATIVE
Comment: NEGATIVE
Comment: NEGATIVE
Comment: NEGATIVE
Comment: NEGATIVE
Comment: NEGATIVE
Comment: NORMAL
Neisseria Gonorrhea: NEGATIVE
Trichomonas: NEGATIVE

## 2023-08-05 LAB — HIV ANTIBODY (ROUTINE TESTING W REFLEX): HIV Screen 4th Generation wRfx: NONREACTIVE

## 2023-08-05 LAB — HEPATITIS C ANTIBODY: Hep C Virus Ab: NONREACTIVE

## 2023-08-05 LAB — RPR: RPR Ser Ql: NONREACTIVE

## 2023-08-05 LAB — HEPATITIS B SURFACE ANTIGEN: Hepatitis B Surface Ag: NEGATIVE

## 2023-08-06 LAB — URINE CULTURE: Organism ID, Bacteria: NO GROWTH

## 2023-08-26 DIAGNOSIS — J018 Other acute sinusitis: Secondary | ICD-10-CM | POA: Diagnosis not present

## 2023-09-17 ENCOUNTER — Other Ambulatory Visit: Payer: Self-pay | Admitting: Obstetrics

## 2023-09-17 DIAGNOSIS — J4520 Mild intermittent asthma, uncomplicated: Secondary | ICD-10-CM

## 2023-12-02 ENCOUNTER — Other Ambulatory Visit (HOSPITAL_COMMUNITY)
Admission: RE | Admit: 2023-12-02 | Discharge: 2023-12-02 | Disposition: A | Discharge status: Home / Self Care | Payer: 59 | Source: Ambulatory Visit | Attending: Obstetrics and Gynecology | Admitting: Obstetrics and Gynecology

## 2023-12-02 ENCOUNTER — Ambulatory Visit (INDEPENDENT_AMBULATORY_CARE_PROVIDER_SITE_OTHER): Payer: 59 | Admitting: Emergency Medicine

## 2023-12-02 VITALS — BP 140/90 | HR 79 | Wt 169.0 lb

## 2023-12-02 DIAGNOSIS — Z113 Encounter for screening for infections with a predominantly sexual mode of transmission: Secondary | ICD-10-CM | POA: Insufficient documentation

## 2023-12-02 NOTE — Progress Notes (Signed)
SUBJECTIVE:  32 y.o. female complains of clear vaginal discharge with pelvic pain for 2 month(s). Denies abnormal vaginal bleeding or  fever. No UTI symptoms. Denies history of known exposure to STD.  No LMP recorded.  OBJECTIVE:  She appears well, afebrile. Urine dipstick: not done.  ASSESSMENT:  Vaginal Discharge  Vaginal Odor   PLAN:  GC, chlamydia, trichomonas, BVAG, CVAG probe sent to lab. Treatment: To be determined once lab results are received ROV prn if symptoms persist or worsen.

## 2023-12-03 LAB — CERVICOVAGINAL ANCILLARY ONLY
Bacterial Vaginitis (gardnerella): NEGATIVE
Candida Glabrata: NEGATIVE
Candida Vaginitis: NEGATIVE
Chlamydia: NEGATIVE
Comment: NEGATIVE
Comment: NEGATIVE
Comment: NEGATIVE
Comment: NEGATIVE
Comment: NEGATIVE
Comment: NORMAL
Neisseria Gonorrhea: NEGATIVE
Trichomonas: NEGATIVE

## 2023-12-04 LAB — HEPATITIS C ANTIBODY: Hep C Virus Ab: NONREACTIVE

## 2023-12-04 LAB — HIV ANTIBODY (ROUTINE TESTING W REFLEX): HIV Screen 4th Generation wRfx: NONREACTIVE

## 2023-12-04 LAB — RPR: RPR Ser Ql: NONREACTIVE

## 2023-12-04 LAB — HEPATITIS B SURFACE ANTIGEN: Hepatitis B Surface Ag: NEGATIVE

## 2023-12-10 ENCOUNTER — Other Ambulatory Visit: Payer: Self-pay

## 2023-12-10 ENCOUNTER — Emergency Department
Admission: EM | Admit: 2023-12-10 | Discharge: 2023-12-10 | Disposition: A | Discharge status: Home / Self Care | Payer: 59 | Attending: Emergency Medicine | Admitting: Emergency Medicine

## 2023-12-10 DIAGNOSIS — J45909 Unspecified asthma, uncomplicated: Secondary | ICD-10-CM | POA: Diagnosis not present

## 2023-12-10 DIAGNOSIS — M549 Dorsalgia, unspecified: Secondary | ICD-10-CM | POA: Diagnosis present

## 2023-12-10 DIAGNOSIS — M62830 Muscle spasm of back: Secondary | ICD-10-CM | POA: Insufficient documentation

## 2023-12-10 MED ORDER — CYCLOBENZAPRINE HCL 5 MG PO TABS: 5.0000 mg | ORAL_TABLET | Freq: Three times a day (TID) | ORAL | 0 refills | Status: DC | PRN

## 2023-12-10 MED ORDER — IBUPROFEN 800 MG PO TABS: 800.0000 mg | ORAL_TABLET | Freq: Three times a day (TID) | ORAL | 0 refills | Status: DC | PRN

## 2023-12-10 MED ORDER — LIDOCAINE 5 % EX PTCH: 1.0000 | MEDICATED_PATCH | Freq: Two times a day (BID) | CUTANEOUS | 0 refills | Status: AC | PRN

## 2023-12-10 MED ORDER — LIDOCAINE 5 % EX PTCH: 1.0000 | MEDICATED_PATCH | Freq: Two times a day (BID) | CUTANEOUS | 0 refills | Status: DC | PRN

## 2023-12-10 MED ORDER — LIDOCAINE 5 % EX PTCH
1.0000 | MEDICATED_PATCH | Freq: Once | CUTANEOUS | Status: DC
  Administered 2023-12-10: 1 via TRANSDERMAL
  Filled 2023-12-10: qty 1

## 2023-12-10 MED ORDER — IBUPROFEN 800 MG PO TABS
800.0000 mg | ORAL_TABLET | Freq: Once | ORAL | Status: AC
  Administered 2023-12-10: 800 mg via ORAL
  Filled 2023-12-10: qty 1

## 2023-12-10 MED ORDER — CYCLOBENZAPRINE HCL 10 MG PO TABS
5.0000 mg | ORAL_TABLET | Freq: Once | ORAL | Status: AC
  Administered 2023-12-10: 5 mg via ORAL
  Filled 2023-12-10: qty 1

## 2023-12-10 NOTE — ED Provider Notes (Signed)
[facility] Emergency Department Provider Note     Event Date/Time   First MD Initiated Contact with Patient 12/10/23 1625     (approximate)   History   Back Pain   HPI  Lauren Conway is a 32 y.o. female with a history of HSV asthma, presents to the ED with rectal neck and scapular pain.  Patient would endorse patient endorsed 3 days of symptoms.  Denies any preceding injury, trauma, or fall.  No headache, weakness, slurred speech, or distal paresthesias.  No chest pain or shortness of breath reported.  Patient reports neck pain and stiffness with range of motion.  Physical Exam   Triage Vital Signs: ED Triage Vitals  Encounter Vitals Group     BP 12/10/23 1558 117/76     Systolic BP Percentile --      Diastolic BP Percentile --      Pulse Rate 12/10/23 1558 77     Resp 12/10/23 1558 17     Temp 12/10/23 1558 98 F (36.7 C)     Temp Source 12/10/23 1558 Oral     SpO2 12/10/23 1558 100 %     Weight 12/10/23 1553 168 lb 14 oz (76.6 kg)     Height --      Head Circumference --      Peak Flow --      Pain Score 12/10/23 1552 9     Pain Loc --      Pain Education --      Exclude from Growth Chart --     Most recent vital signs: Vitals:   12/10/23 1558  BP: 117/76  Pulse: 77  Resp: 17  Temp: 98 F (36.7 C)  SpO2: 100%    General Awake, no distress. NAD HEENT NCAT. PERRL. EOMI. No rhinorrhea. Mucous membranes are moist.  CV:  Good peripheral perfusion. RRR RESP:  Normal effort. CTA ABD:  No distention.  MSK:  Normal spinal alignment without midline tenderness, spasm, deformity, or step-off.  Patient with a palpable active spasm to the left scapulothoracic border.   ED Results / Procedures / Treatments   Labs (all labs ordered are listed, but only abnormal results are displayed) Labs Reviewed - No data to display   EKG   RADIOLOGY  No results found.   PROCEDURES:  Critical Care performed:  No  Procedures   MEDICATIONS ORDERED IN ED: Medications  lidocaine (LIDODERM) 5 % 1 patch (1 patch Transdermal Patch Applied 12/10/23 1648)  cyclobenzaprine (FLEXERIL) tablet 5 mg (5 mg Oral Given 12/10/23 1648)  ibuprofen (ADVIL) tablet 800 mg (800 mg Oral Given 12/10/23 1648)     IMPRESSION / MDM / ASSESSMENT AND PLAN / ED COURSE  I reviewed the triage vital signs and the nursing notes.                              Differential diagnosis includes, but is not limited to, muscle spasm, muscle strain, myalgia, chest wall contusion, cervical radiculopathy  Patient's presentation is most consistent with acute, uncomplicated illness.  Patient's diagnosis is consistent with scapulothoracic muscle strain and spasm.  No acute neuromuscular deficits on exam.  Presentation is consistent with acute muscle spasm of the scapulothoracic region.  Patient will be discharged home with prescriptions for benzopyrene, ibuprofen, and Lidoderm patches. Patient is to follow up with primary provider as discussed, as needed or otherwise directed. Patient is given ED precautions  to return to the ED for any worsening or new symptoms.  FINAL CLINICAL IMPRESSION(S) / ED DIAGNOSES   Final diagnoses:  Muscle spasm of back     Rx / DC Orders   ED Discharge Orders          Ordered    cyclobenzaprine (FLEXERIL) 5 MG tablet  3 times daily PRN,   Status:  Discontinued        12/10/23 1635    lidocaine (LIDODERM) 5 %  Every 12 hours PRN,   Status:  Discontinued        12/10/23 1635    ibuprofen (ADVIL) 800 MG tablet  Every 8 hours PRN,   Status:  Discontinued        12/10/23 1635    cyclobenzaprine (FLEXERIL) 5 MG tablet  3 times daily PRN        12/10/23 1643    ibuprofen (ADVIL) 800 MG tablet  Every 8 hours PRN        12/10/23 1643    lidocaine (LIDODERM) 5 %  Every 12 hours PRN        12/10/23 1643             Note:  This document was prepared using Dragon voice recognition software and may  include unintentional dictation errors.    Lissa Hoard, PA-C 12/10/23 1656    Trinna Post, MD 12/10/23 Windell Moment

## 2023-12-10 NOTE — Discharge Instructions (Addendum)
Your exam is consistent with muscle spasm. Take the prescription meds as directed. Apply moist heat to reduce symptoms.

## 2023-12-10 NOTE — ED Triage Notes (Signed)
C/O right neck and right scapular pain x 3 days. Denies injury.

## 2023-12-22 ENCOUNTER — Telehealth: Payer: Self-pay

## 2023-12-22 NOTE — Progress Notes (Signed)
 Transition Care Management Follow-up Telephone Call Date of discharge and from where: 12/10/2023 [facility] How have you been since you were released from the hospital? Patient stated she is feeling much better. Any questions or concerns? No  Items Reviewed: Did the pt receive and understand the discharge instructions provided? Yes  Medications obtained and verified? Yes  Other? No  Any new allergies since your discharge? No  Dietary orders reviewed? Yes Do you have support at home? Yes   Follow up appointments reviewed:  PCP Hospital f/u appt confirmed? No  Scheduled to see  on  @ . Specialist Hospital f/u appt confirmed? Yes  Scheduled to see [name], NP on 01/14/2024 @ [facility]. Are transportation arrangements needed? No  If their condition worsens, is the pt aware to call PCP or go to the Emergency Dept.? Yes Was the patient provided with contact information for the PCP's office or ED? Yes Was to pt encouraged to call back with questions or concerns? Yes   Lauren Conway [facility]  [facility] [place], [facility] Guide Direct Dial: [phone]  Website: [email or web address]

## 2024-01-01 DIAGNOSIS — R1010 Upper abdominal pain, unspecified: Secondary | ICD-10-CM | POA: Diagnosis not present

## 2024-01-01 DIAGNOSIS — K921 Melena: Secondary | ICD-10-CM | POA: Diagnosis not present

## 2024-01-01 DIAGNOSIS — K219 Gastro-esophageal reflux disease without esophagitis: Secondary | ICD-10-CM | POA: Diagnosis not present

## 2024-01-01 DIAGNOSIS — K5900 Constipation, unspecified: Secondary | ICD-10-CM | POA: Diagnosis not present

## 2024-01-14 ENCOUNTER — Other Ambulatory Visit: Payer: Self-pay | Admitting: Obstetrics

## 2024-01-14 ENCOUNTER — Encounter (INDEPENDENT_AMBULATORY_CARE_PROVIDER_SITE_OTHER): Payer: Self-pay | Admitting: Primary Care

## 2024-01-14 ENCOUNTER — Ambulatory Visit (INDEPENDENT_AMBULATORY_CARE_PROVIDER_SITE_OTHER): Payer: 59 | Admitting: Primary Care

## 2024-01-14 ENCOUNTER — Encounter: Payer: Self-pay | Admitting: Obstetrics

## 2024-01-14 ENCOUNTER — Other Ambulatory Visit (HOSPITAL_COMMUNITY)
Admission: RE | Admit: 2024-01-14 | Discharge: 2024-01-14 | Disposition: A | Discharge status: Home / Self Care | Payer: 59 | Source: Ambulatory Visit | Attending: Obstetrics | Admitting: Obstetrics

## 2024-01-14 ENCOUNTER — Ambulatory Visit: Payer: 59 | Admitting: Obstetrics

## 2024-01-14 VITALS — BP 130/89 | HR 74 | Ht 63.0 in | Wt 169.7 lb

## 2024-01-14 VITALS — BP 124/86 | HR 84 | Resp 16 | Ht 63.0 in | Wt 168.0 lb

## 2024-01-14 DIAGNOSIS — G8929 Other chronic pain: Secondary | ICD-10-CM | POA: Diagnosis not present

## 2024-01-14 DIAGNOSIS — Z113 Encounter for screening for infections with a predominantly sexual mode of transmission: Secondary | ICD-10-CM

## 2024-01-14 DIAGNOSIS — F419 Anxiety disorder, unspecified: Secondary | ICD-10-CM

## 2024-01-14 DIAGNOSIS — A6000 Herpesviral infection of urogenital system, unspecified: Secondary | ICD-10-CM

## 2024-01-14 DIAGNOSIS — N644 Mastodynia: Secondary | ICD-10-CM

## 2024-01-14 DIAGNOSIS — Z2821 Immunization not carried out because of patient refusal: Secondary | ICD-10-CM

## 2024-01-14 DIAGNOSIS — N946 Dysmenorrhea, unspecified: Secondary | ICD-10-CM

## 2024-01-14 DIAGNOSIS — N898 Other specified noninflammatory disorders of vagina: Secondary | ICD-10-CM | POA: Insufficient documentation

## 2024-01-14 DIAGNOSIS — Z7689 Persons encountering health services in other specified circumstances: Secondary | ICD-10-CM

## 2024-01-14 DIAGNOSIS — N921 Excessive and frequent menstruation with irregular cycle: Secondary | ICD-10-CM | POA: Diagnosis not present

## 2024-01-14 DIAGNOSIS — R1310 Dysphagia, unspecified: Secondary | ICD-10-CM | POA: Diagnosis not present

## 2024-01-14 DIAGNOSIS — M546 Pain in thoracic spine: Secondary | ICD-10-CM

## 2024-01-14 DIAGNOSIS — E663 Overweight: Secondary | ICD-10-CM

## 2024-01-14 DIAGNOSIS — Z6829 Body mass index (BMI) 29.0-29.9, adult: Secondary | ICD-10-CM | POA: Diagnosis not present

## 2024-01-14 DIAGNOSIS — F32A Depression, unspecified: Secondary | ICD-10-CM | POA: Diagnosis not present

## 2024-01-14 DIAGNOSIS — Z28310 Unvaccinated for covid-19: Secondary | ICD-10-CM | POA: Diagnosis not present

## 2024-01-14 MED ORDER — IBUPROFEN 600 MG PO TABS: 600.0000 mg | ORAL_TABLET | Freq: Three times a day (TID) | ORAL | 1 refills | Status: DC | PRN

## 2024-01-14 MED ORDER — IBUPROFEN 800 MG PO TABS: 800.0000 mg | ORAL_TABLET | Freq: Three times a day (TID) | ORAL | 5 refills | Status: DC | PRN

## 2024-01-14 MED ORDER — VALACYCLOVIR HCL 1 G PO TABS: 1000.0000 mg | ORAL_TABLET | Freq: Two times a day (BID) | ORAL | 11 refills | Status: AC

## 2024-01-14 NOTE — Progress Notes (Signed)
Pt. Presents to discuss HSV, painful cycles, and breast pain.

## 2024-01-14 NOTE — Patient Instructions (Signed)
Nonsurgical Weight Loss Treatment Options Nonsurgical weight loss has many different components. You will learn how to work with a specialized care team to incorporate diet, exercise, and lifestyle changes into your life. To view the content, go to this web address: [email or web address]  This video will expire on: 11/26/2025. If you need access to this video following this date, please reach out to the healthcare provider who assigned it to you. This information is not intended to replace advice given to you by your health care provider. Make sure you discuss any questions you have with your health care provider. Elsevier Patient Education  2024 [employer].

## 2024-01-14 NOTE — Progress Notes (Signed)
New Patient Office Visit  Subjective    Patient ID: Lauren Conway female  DOB: 1991/11/20  Age: 33 y.o. MRN: [id number]   CC:  Back pain  HPI     Back Pain    Additional comments: ED visit 12/10/23        Dysphagia    Additional comments: Feels like something is stuck       Last edited by Lauren Conway, RMA on 01/14/2024  9:03 AM.      [name] is a 33 year old overweight female who presents today to establish care.  She also was seen in the emergency room on 12/10/2023 for muscle spasms in her back she was treated with antispasmodic and topical NSAID.  She has several concerns 1 is difficulty swallowing food liquids.chronic back pain- cervical-thoracic area 8/10 constantly worst with activities. Experiencing depression and anxiety. Denies harm to self or other she denies visual hallucination but does have auditory voices but good voices. Not harmful. She works from home on 3-11 shift and has difficulty falling to sleep. She denies snoring will have her to ask her children.  Patient has No headache, No chest pain, No abdominal pain - No Nausea, No new weakness tingling or numbness, No Cough - shortness of breath . Current Outpatient Medications on File Prior to Visit  Medication Sig Dispense Refill   albuterol (VENTOLIN HFA) 108 (90 Base) MCG/ACT inhaler INHALE 2 PUFFS INTO THE LUNGS EVERY 4 HOURS AS NEEDED FOR WHEEZING OR SHORTNESS OF BREATH. 6.7 each 1   No current facility-administered medications on file prior to visit.     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   PID (pelvic  inflammatory disease)      Past Surgical History:  Procedure Laterality Date   TUBAL LIGATION N/A 10/29/2017   Procedure: POST PARTUM TUBAL LIGATION;  Surgeon: Conan Bowens, MD;  Location: [facility];  Service: Gynecology;  Laterality: N/A;     Family History  Problem Relation Age of Onset   Hypertension Mother    Depression Mother    Breast cancer Paternal Grandmother    Diabetes Paternal Grandmother    Asthma Maternal Grandmother    Other Neg Hx     Social History   Socioeconomic History   Marital status: Single    Spouse name: Not on file   Number of children: 3   Years of education: Not on file   Highest education level: Some college, no degree  Occupational History   Not on file  Tobacco Use   Smoking status: Former    Current packs/day: 0.00    Types: Cigarettes    Quit date: 04/16/2022    Years since quitting: 1.7    Passive exposure: Never   Smokeless tobacco: Never  Vaping Use   Vaping status: Never Used  Substance and Sexual Activity   Alcohol use: Yes    Alcohol/week: 0.0 standard drinks of alcohol    Comment: occassionally   Drug use: Not  Currently    Types: Marijuana    Comment: no more   Sexual activity: Yes    Partners: Male    Birth control/protection: Surgical  Other Topics Concern   Not on file  Social History Narrative   Not on file   Social Drivers of Health   Financial Resource Strain: Low Risk  (12/22/2023)   Overall Financial Resource Strain (CARDIA)    Difficulty of Paying Living Expenses: Not very hard  Food Insecurity: No Food Insecurity (12/22/2023)   Hunger Vital Sign    Worried About Running Out of Food in the Last Year: Never true    Ran Out of Food in the Last Year: Never true  Transportation Needs: No Transportation Needs (12/22/2023)   PRAPARE - Administrator, Civil Service (Medical): No    Lack of Transportation (Non-Medical): No  Physical Activity: Not on file  Stress: Not on file  Social  Connections: Not on file  Intimate Partner Violence: Not on file   Health Maintenance  Topic Date Due   Pap Smear  11/02/2023   COVID-19 Vaccine (1 - 2024-25 season) 01/30/2024*   Pneumococcal Vaccination (1 of 2 - PCV) 01/13/2025*   DTaP/Tdap/Td vaccine (2 - Td or Tdap) 10/04/2027   Hepatitis C Screening  Completed   HIV Screening  Completed   HPV Vaccine  Aged Out   Flu Shot  Discontinued  *Topic was postponed. The date shown is not the original due date.    Objective    BP 124/86   Pulse 84   Resp 16   Ht 5\' 3"  (1.6 m)   Wt 168 lb (76.2 kg)   LMP 01/13/2024   SpO2 100%   BMI 29.76 kg/m  BP Readings from Last 3 Encounters:  01/14/24 130/89  01/14/24 124/86  12/10/23 117/76    Physical Exam Vitals reviewed.  Constitutional:      Appearance: Normal appearance.     Comments: Over weight   HENT:     Head: Normocephalic.     Right Ear: Tympanic membrane, ear canal and external ear normal.     Left Ear: Tympanic membrane, ear canal and external ear normal.     Nose: Nose normal.     Mouth/Throat:     Mouth: Mucous membranes are moist.  Eyes:     Extraocular Movements: Extraocular movements intact.     Pupils: Pupils are equal, round, and reactive to light.  Cardiovascular:     Rate and Rhythm: Normal rate and regular rhythm.  Pulmonary:     Effort: Pulmonary effort is normal.     Breath sounds: Normal breath sounds.  Abdominal:     General: Bowel sounds are normal.     Palpations: Abdomen is soft.  Musculoskeletal:        General: Normal range of motion.     Cervical back: Normal range of motion.  Skin:    General: Skin is warm and dry.  Neurological:     Mental Status: She is alert and oriented to person, place, and time.  Psychiatric:        Mood and Affect: Mood normal.        Behavior: Behavior normal.        Thought Content: Thought content normal.        Judgment: Judgment normal.     Assessment & Plan:  Niah was seen today for new patient  (initial visit), back pain, depression, anxiety and dysphagia.  Diagnoses and all  orders for this visit:  Encounter to establish care See HPI  Influenza vaccination declined  Pneumococcal vaccination declined  COVID-19 vaccine series declined  Anxiety and depression Flowsheet Row Office Visit from 01/14/2024 in [facility]  PHQ-2 Total Score 3      She has a follow-up appointment with licensing clinical social worker  Dysphagia, unspecified type Patient stated she has an appointment scheduled with GI  Over weight Discussed diet and exercise for person with BMI >25. Instructed: You must burn more calories than you eat. Losing 5 percent of your body weight should be considered a success. In the longer term, losing more than 15 percent of your body weight and staying at this weight is an extremely good result. However, keep in mind that even losing 5 percent of your body weight leads to important health benefits, so try not to get discouraged if you're not able to lose more than this. Will recheck weight in 3-6 months.  Menorrhagia with irregular cycle -     CMP14+EGFR -     CBC with Differential  Chronic bilateral thoracic back pain -     Ambulatory referral to Orthopedic Surgery  Other orders -     Discontinue: ibuprofen (ADVIL) 600 MG tablet; Take 1 tablet (600 mg total) by mouth every 8 (eight) hours as needed.      Follow-up:    The above assessment and management plan was discussed with the patient. The patient verbalized understanding of and has agreed to the management plan. Patient is aware to call the clinic if symptoms fail to improve or worsen. Patient is aware when to return to the clinic for a follow-up visit. Patient educated on when it is appropriate to go to the emergency department.   Gwinda Passe, NP-C

## 2024-01-14 NOTE — Progress Notes (Signed)
Patient ID: Lauren Conway, female   DOB: 11-07-1991, 33 y.o.   MRN: [id number]  No chief complaint on file.   HPI Lauren Conway is a 33 y.o. female.  Vaginal discharge and heavy and painful period with last cycle.  Also c/o breast pain in axilla, bilaterally. 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   PID (pelvic inflammatory disease)     Past Surgical History:  Procedure Laterality Date   TUBAL LIGATION N/A 10/29/2017   Procedure: POST PARTUM TUBAL LIGATION;  Surgeon: Conan Bowens, MD;  Location: [facility] SU[facility];  Service: Gynecology;  Laterality: N/A;    Family History  Problem Relation Age of Onset   Hypertension Mother    Depression Mother    Breast cancer Paternal Grandmother    Diabetes Paternal Grandmother    Asthma Maternal Grandmother    Other Neg Hx     Social History Social History   Tobacco Use   Smoking status: Former    Current packs/day: 0.00    Types: Cigarettes    Quit date: 04/16/2022    Years since quitting: 1.7    Passive exposure: Never   Smokeless tobacco: Never  Vaping Use   Vaping status: Never Used  Substance Use Topics   Alcohol use: Yes    Alcohol/week: 0.0 standard drinks of alcohol    Comment: occassionally   Drug use: Not Currently    Types: Marijuana    Comment: no more    Allergies  Allergen Reactions   Egg-Derived Products Hives and Swelling   Latex Itching and Swelling   Pecan Nut (Diagnostic) Hives    Current Outpatient Medications  Medication Sig Dispense Refill   albuterol (VENTOLIN HFA) 108 (90 Base) MCG/ACT inhaler INHALE 2 PUFFS INTO THE LUNGS EVERY 4 HOURS AS NEEDED FOR WHEEZING OR SHORTNESS OF BREATH. 6.7 each 1   ibuprofen (ADVIL) 800 MG tablet Take 1 tablet (800  mg total) by mouth every 8 (eight) hours as needed. 30 tablet 5   valACYclovir (VALTREX) 1000 MG tablet Take 1 tablet (1,000 mg total) by mouth 2 (two) times daily. For 5 days prn each outbreak 30 tablet 11   No current facility-administered medications for this visit.    Review of Systems Review of Systems Constitutional: negative for fatigue and weight loss Respiratory: negative for cough and wheezing Cardiovascular: negative for chest pain, fatigue and palpitations Gastrointestinal: negative for abdominal pain and change in bowel habits Genitourinary:negative Integument/breast: negative for nipple discharge Musculoskeletal:negative for myalgias Neurological: negative for gait problems and tremors Behavioral/Psych: negative for abusive relationship, depression Endocrine: negative for temperature intolerance      Blood pressure 130/89, pulse 74, height 5\' 3"  (1.6 m), weight 169 lb 11.2 oz (77 kg), last menstrual period 01/13/2024.  Physical Exam Physical Exam General:   Alert and no disttrerss  Skin:   no rash or abnormalities  Lungs:   clear to auscultation bilaterally  Heart:   regular rate and rhythm, S1, S2 normal, no murmur, click, rub or gallop  Breasts:    Tender to palpation axillary tail, bilaterally, but without suspicious masses, skin or nipple changes or axillary nodes  Abdomen:  normal findings: no organomegaly, soft, non-tender and no hernia  Pelvic exam deferred because patient having heavy menstrual bleeding  I have spent a total of 20 minutes of face-to-face time, excluding clinical staff time, reviewing notes and preparing to see patient, ordering tests and/or medications, and counseling the patient.   Data Reviewed Wet Prep  Assessment     1. Vaginal discharge (Primary) Rx: - Cervicovaginal ancillary only  2. Screening for STD (sexually transmitted disease) Rx: - HSV(herpes simplex vrs) 1+2 ab-IgG - HIV Antibody (routine testing w rflx) - Hepatitis B  surface antigen - RPR - Hepatitis C antibody  3. Herpes simplex infection of genitourinary system Rx: - valACYclovir (VALTREX) 1000 MG tablet; Take 1 tablet (1,000 mg total) by mouth 2 (two) times daily. For 5 days prn each outbreak  Dispense: 30 tablet; Refill: 11  4. Dysmenorrhea Rx: - ibuprofen (ADVIL) 800 MG tablet; Take 1 tablet (800 mg total) by mouth every 8 (eight) hours as needed.  Dispense: 30 tablet; Refill: 5  5. Breast pain in female Rx: - MM Digital Diagnostic Bilat; Future - MM 3D DIAGNOSTIC MAMMOGRAM BILATERAL BREAST; Future     Plan   Follow up in 6 weeks for Annual/Pap  Orders Placed This Encounter  Procedures   MM Digital Diagnostic Bilat    Standing Status:   Future    Expiration Date:   01/13/2025    Reason for Exam (SYMPTOM  OR DIAGNOSIS REQUIRED):   Breast pain in axillary tail, bilaterally    Is the patient pregnant?:   No    Preferred [facility] location?:   [facility]   MM 3D DIAGNOSTIC MAMMOGRAM BILATERAL BREAST    Standing Status:   Future    Expiration Date:   01/13/2025    Reason for Exam (SYMPTOM  OR DIAGNOSIS REQUIRED):   Breast pain in axillary tail, bilaterally    Is the patient pregnant?:   No    Preferred imaging location?:   [facility]   HSV(herpes simplex vrs) 1+2 ab-IgG   HIV Antibody (routine testing w rflx)   Hepatitis B surface antigen   RPR   Hepatitis C antibody   Meds ordered this encounter  Medications   ibuprofen (ADVIL) 800 MG tablet    Sig: Take 1 tablet (800 mg total) by mouth every 8 (eight) hours as needed.    Dispense:  30 tablet    Refill:  5   valACYclovir (VALTREX) 1000 MG tablet    Sig: Take 1 tablet (1,000 mg total) by mouth 2 (two) times daily. For 5 days prn each outbreak    Dispense:  30 tablet    Refill:  11      Brock Bad, MD, FACOG Attending Obstetrician & Gynecologist, [place] for [facility], [facility], [place] 01/14/2024

## 2024-01-15 ENCOUNTER — Telehealth (INDEPENDENT_AMBULATORY_CARE_PROVIDER_SITE_OTHER): Payer: Self-pay | Admitting: Licensed Clinical Social Worker

## 2024-01-15 ENCOUNTER — Encounter (INDEPENDENT_AMBULATORY_CARE_PROVIDER_SITE_OTHER): Payer: Self-pay

## 2024-01-15 LAB — CMP14+EGFR
ALT: 7 [IU]/L (ref 0–32)
AST: 17 [IU]/L (ref 0–40)
Albumin: 4.2 g/dL (ref 3.9–4.9)
Alkaline Phosphatase: 90 [IU]/L (ref 44–121)
BUN/Creatinine Ratio: 7 — ABNORMAL LOW (ref 9–23)
BUN: 5 mg/dL — ABNORMAL LOW (ref 6–20)
Bilirubin Total: 0.4 mg/dL (ref 0.0–1.2)
CO2: 22 mmol/L (ref 20–29)
Calcium: 9.3 mg/dL (ref 8.7–10.2)
Chloride: 102 mmol/L (ref 96–106)
Creatinine, Ser: 0.75 mg/dL (ref 0.57–1.00)
Globulin, Total: 3 g/dL (ref 1.5–4.5)
Glucose: 75 mg/dL (ref 70–99)
Potassium: 3.9 mmol/L (ref 3.5–5.2)
Sodium: 140 mmol/L (ref 134–144)
Total Protein: 7.2 g/dL (ref 6.0–8.5)
eGFR: 109 mL/min/{1.73_m2} (ref >59)

## 2024-01-15 LAB — CERVICOVAGINAL ANCILLARY ONLY
Bacterial Vaginitis (gardnerella): NEGATIVE
Candida Glabrata: NEGATIVE
Candida Vaginitis: NEGATIVE
Chlamydia: NEGATIVE
Comment: NEGATIVE
Comment: NEGATIVE
Comment: NEGATIVE
Comment: NEGATIVE
Comment: NEGATIVE
Comment: NORMAL
Neisseria Gonorrhea: NEGATIVE
Trichomonas: NEGATIVE

## 2024-01-15 LAB — CBC WITH DIFFERENTIAL/PLATELET
Basophils Absolute: 0 10*3/uL (ref 0.0–0.2)
Basos: 1 %
EOS (ABSOLUTE): 0.1 10*3/uL (ref 0.0–0.4)
Eos: 1 %
Hematocrit: 36.2 % (ref 34.0–46.6)
Hemoglobin: 11.6 g/dL (ref 11.1–15.9)
Immature Grans (Abs): 0 10*3/uL (ref 0.0–0.1)
Immature Granulocytes: 0 %
Lymphocytes Absolute: 2.8 10*3/uL (ref 0.7–3.1)
Lymphs: 36 %
MCH: 29.2 pg (ref 26.6–33.0)
MCHC: 32 g/dL (ref 31.5–35.7)
MCV: 91 fL (ref 79–97)
Monocytes Absolute: 0.3 10*3/uL (ref 0.1–0.9)
Monocytes: 4 %
Neutrophils Absolute: 4.4 10*3/uL (ref 1.4–7.0)
Neutrophils: 58 %
Platelets: 332 10*3/uL (ref 150–450)
RBC: 3.97 x10E6/uL (ref 3.77–5.28)
RDW: 13.6 % (ref 11.7–15.4)
WBC: 7.6 10*3/uL (ref 3.4–10.8)

## 2024-01-15 LAB — HIV ANTIBODY (ROUTINE TESTING W REFLEX): HIV Screen 4th Generation wRfx: NONREACTIVE

## 2024-01-15 LAB — RPR: RPR Ser Ql: NONREACTIVE

## 2024-01-15 LAB — HEPATITIS B SURFACE ANTIGEN: Hepatitis B Surface Ag: NEGATIVE

## 2024-01-15 LAB — HEPATITIS C ANTIBODY: Hep C Virus Ab: NONREACTIVE

## 2024-01-15 LAB — HSV 1 AND 2 AB, IGG
HSV 1 Glycoprotein G Ab, IgG: REACTIVE — AB
HSV 2 IgG, Type Spec: NONREACTIVE

## 2024-01-15 NOTE — Telephone Encounter (Addendum)
LCSWA called patient today to introduce herself and to assess patients' mental health needs. Patient did answer the phone we were able to speak about her anxiety and depression and what the leading causes may be. The pt was able to express that she was on medication 4 years ago and thinks she would benefits most from being on medication again. I let the pt know would send her some outside counseling resources. Pt has an appointment with LCSWA on 2/27. Patient was referred by PCP for anxiety and depression.

## 2024-01-18 ENCOUNTER — Encounter (INDEPENDENT_AMBULATORY_CARE_PROVIDER_SITE_OTHER): Payer: Self-pay | Admitting: Primary Care

## 2024-01-19 ENCOUNTER — Encounter (INDEPENDENT_AMBULATORY_CARE_PROVIDER_SITE_OTHER): Payer: Self-pay | Admitting: Primary Care

## 2024-01-19 ENCOUNTER — Other Ambulatory Visit: Payer: Self-pay

## 2024-01-19 DIAGNOSIS — J4520 Mild intermittent asthma, uncomplicated: Secondary | ICD-10-CM

## 2024-01-19 NOTE — Progress Notes (Unsigned)
 Pt requesting refill

## 2024-01-20 MED ORDER — ALBUTEROL SULFATE HFA 108 (90 BASE) MCG/ACT IN AERS: 2.0000 | INHALATION_SPRAY | RESPIRATORY_TRACT | 1 refills | Status: DC | PRN

## 2024-01-29 DIAGNOSIS — K222 Esophageal obstruction: Secondary | ICD-10-CM | POA: Diagnosis not present

## 2024-01-29 DIAGNOSIS — K449 Diaphragmatic hernia without obstruction or gangrene: Secondary | ICD-10-CM | POA: Diagnosis not present

## 2024-01-29 DIAGNOSIS — K319 Disease of stomach and duodenum, unspecified: Secondary | ICD-10-CM | POA: Diagnosis not present

## 2024-01-29 DIAGNOSIS — R1010 Upper abdominal pain, unspecified: Secondary | ICD-10-CM | POA: Diagnosis not present

## 2024-01-29 DIAGNOSIS — K2930 Chronic superficial gastritis without bleeding: Secondary | ICD-10-CM | POA: Diagnosis not present

## 2024-02-05 ENCOUNTER — Other Ambulatory Visit: Payer: 59

## 2024-02-09 ENCOUNTER — Ambulatory Visit: Payer: 59 | Admitting: Orthopedic Surgery

## 2024-02-12 ENCOUNTER — Institutional Professional Consult (permissible substitution) (INDEPENDENT_AMBULATORY_CARE_PROVIDER_SITE_OTHER): Payer: Self-pay | Admitting: Licensed Clinical Social Worker

## 2024-02-26 ENCOUNTER — Institutional Professional Consult (permissible substitution) (INDEPENDENT_AMBULATORY_CARE_PROVIDER_SITE_OTHER): Payer: Self-pay | Admitting: Licensed Clinical Social Worker

## 2024-02-26 ENCOUNTER — Telehealth (INDEPENDENT_AMBULATORY_CARE_PROVIDER_SITE_OTHER): Payer: Self-pay

## 2024-02-26 NOTE — Telephone Encounter (Signed)
 Reached out to pt to cancel appt with LCSW due to her being out pt didn't answer lvm   Lauren Conway would be able to refer pt to behavorial health for anxiety and depression

## 2024-03-01 ENCOUNTER — Ambulatory Visit: Payer: 59 | Admitting: Orthopedic Surgery

## 2024-03-09 ENCOUNTER — Telehealth (INDEPENDENT_AMBULATORY_CARE_PROVIDER_SITE_OTHER): Payer: Self-pay | Admitting: Primary Care

## 2024-03-09 NOTE — Telephone Encounter (Signed)
 Spoke to pt about appt.. Will be present

## 2024-03-11 ENCOUNTER — Ambulatory Visit: Payer: 59 | Admitting: Obstetrics

## 2024-03-15 ENCOUNTER — Other Ambulatory Visit: Payer: 59

## 2024-03-17 ENCOUNTER — Telehealth (INDEPENDENT_AMBULATORY_CARE_PROVIDER_SITE_OTHER): Payer: Self-pay | Admitting: Primary Care

## 2024-03-17 ENCOUNTER — Ambulatory Visit (INDEPENDENT_AMBULATORY_CARE_PROVIDER_SITE_OTHER): Payer: 59 | Admitting: Primary Care

## 2024-03-17 NOTE — Telephone Encounter (Signed)
 Called pt to offer a virtual so that we do not have a missed appt for pt. Pt did not answer and VM was left for pt.

## 2024-03-18 NOTE — Telephone Encounter (Signed)
 Error

## 2024-03-23 ENCOUNTER — Other Ambulatory Visit (HOSPITAL_COMMUNITY)
Admission: RE | Admit: 2024-03-23 | Discharge: 2024-03-23 | Disposition: A | Discharge status: Home / Self Care | Source: Ambulatory Visit | Attending: Obstetrics and Gynecology | Admitting: Obstetrics and Gynecology

## 2024-03-23 ENCOUNTER — Ambulatory Visit: Admitting: *Deleted

## 2024-03-23 VITALS — BP 138/79 | HR 80

## 2024-03-23 DIAGNOSIS — N898 Other specified noninflammatory disorders of vagina: Secondary | ICD-10-CM

## 2024-03-23 NOTE — Progress Notes (Signed)
 SUBJECTIVE:  33 y.o. female complains of white vaginal discharge and vaginal itching for 7 day(s). Denies abnormal vaginal bleeding or significant pelvic pain or fever. No UTI symptoms. Denies history of known exposure to STD.  No LMP recorded.  OBJECTIVE:  She appears well, afebrile. Urine dipstick: not done.  ASSESSMENT:  Vaginal Discharge  Vaginal Odor   PLAN:  GC, chlamydia, trichomonas, BVAG, CVAG probe sent to lab. Treatment: To be determined once lab results are received ROV prn if symptoms persist or worsen.

## 2024-03-26 ENCOUNTER — Other Ambulatory Visit: Payer: Self-pay

## 2024-03-26 MED ORDER — FLUCONAZOLE 150 MG PO TABS: 150.0000 mg | ORAL_TABLET | Freq: Once | ORAL | 0 refills | Status: DC

## 2024-03-26 MED ORDER — METRONIDAZOLE 0.75 % VA GEL: 1.0000 | Freq: Every day | VAGINAL | 1 refills | Status: DC

## 2024-03-26 NOTE — Progress Notes (Signed)
 Rx diflucan and metrogel for pt symptoms, swab not resulted, pt unable to wait through weekend.

## 2024-03-30 LAB — CERVICOVAGINAL ANCILLARY ONLY
Bacterial Vaginitis (gardnerella): NEGATIVE
Candida Glabrata: NEGATIVE
Candida Vaginitis: POSITIVE — AB
Chlamydia: NEGATIVE
Comment: NEGATIVE
Comment: NEGATIVE
Comment: NEGATIVE
Comment: NEGATIVE
Comment: NEGATIVE
Comment: NORMAL
Neisseria Gonorrhea: NEGATIVE
Trichomonas: NEGATIVE

## 2024-04-02 ENCOUNTER — Other Ambulatory Visit: Payer: Self-pay | Admitting: Obstetrics and Gynecology

## 2024-04-05 ENCOUNTER — Other Ambulatory Visit: Payer: Self-pay

## 2024-04-05 MED ORDER — FLUCONAZOLE 150 MG PO TABS: 150.0000 mg | ORAL_TABLET | Freq: Once | ORAL | 0 refills | Status: AC

## 2024-04-05 NOTE — Progress Notes (Signed)
 Pt completed diflucan , but still experiencing symptoms, and can see yeast per pt. Rx for diflucan  sent for pt treatment.

## 2024-04-07 ENCOUNTER — Ambulatory Visit
Admission: RE | Admit: 2024-04-07 | Discharge: 2024-04-07 | Disposition: A | Discharge status: Home / Self Care | Source: Ambulatory Visit | Attending: Obstetrics | Admitting: Obstetrics

## 2024-04-07 ENCOUNTER — Encounter (INDEPENDENT_AMBULATORY_CARE_PROVIDER_SITE_OTHER): Payer: Self-pay | Admitting: Primary Care

## 2024-04-07 ENCOUNTER — Ambulatory Visit (INDEPENDENT_AMBULATORY_CARE_PROVIDER_SITE_OTHER): Admitting: Primary Care

## 2024-04-07 ENCOUNTER — Other Ambulatory Visit: Payer: Self-pay | Admitting: Obstetrics

## 2024-04-07 VITALS — BP 116/79 | HR 89 | Resp 16 | Wt 171.2 lb

## 2024-04-07 DIAGNOSIS — N644 Mastodynia: Secondary | ICD-10-CM

## 2024-04-07 DIAGNOSIS — R0981 Nasal congestion: Secondary | ICD-10-CM

## 2024-04-07 DIAGNOSIS — Z113 Encounter for screening for infections with a predominantly sexual mode of transmission: Secondary | ICD-10-CM

## 2024-04-07 DIAGNOSIS — J302 Other seasonal allergic rhinitis: Secondary | ICD-10-CM | POA: Diagnosis not present

## 2024-04-07 DIAGNOSIS — N6489 Other specified disorders of breast: Secondary | ICD-10-CM

## 2024-04-07 MED ORDER — LORATADINE 10 MG PO TABS: 10.0000 mg | ORAL_TABLET | Freq: Every day | ORAL | 11 refills | Status: AC

## 2024-04-07 MED ORDER — FLUTICASONE PROPIONATE 50 MCG/ACT NA SUSP: 2.0000 | Freq: Every day | NASAL | 6 refills | Status: AC

## 2024-04-07 NOTE — Progress Notes (Signed)
 [facility]  Lauren Conway, is a 33 y.o. female  ZOX:[id number]  [id number]:[id number]  DOB - 1991-05-01  Chief Complaint  Patient presents with   Sore Throat    Feels drainage in the back of the back        Subjective:   Lauren Conway is a 33 y.o. female here today for an acute visit.  Sore Throat  This is a new problem. The current episode started in the past 7 days. The problem has been gradually worsening. Neither side of throat is experiencing more pain than the other. There has been no fever. The pain is at a severity of 5/10. The pain is mild. Associated symptoms include headaches, a hoarse voice and trouble swallowing. Treatments tried: nightquil. The treatment provided mild relief.   No problems updated.  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 6.7 each 1   ibuprofen  (ADVIL ) 800 MG tablet Take 1 tablet (800 mg total) by mouth every 8 (eight) hours as needed. 30 tablet 5   metroNIDAZOLE  (METROGEL ) 0.75 % vaginal gel Place 1 Applicatorful vaginally at bedtime. Apply one applicatorful to vagina at bedtime for 5 days 70 g 1   valACYclovir  (VALTREX ) 1000 MG tablet Take 1 tablet (1,000 mg total) by mouth 2 (two) times daily. For 5 days prn each  outbreak 30 tablet 11   No current facility-administered medications on file prior to visit.   Health Maintenance  Topic Date Due   COVID-19 Vaccine (1 - 2024-25 season) Never done   Pap Smear  11/02/2023   Pneumococcal Vaccination (1 of 2 - PCV) 01/13/2025*   DTaP/Tdap/Td vaccine (2 - Td or Tdap) 10/04/2027   Hepatitis C Screening  Completed   HIV Screening  Completed   HPV Vaccine  Aged Out   Meningitis B Vaccine  Aged Out   Flu Shot  Discontinued  *Topic was postponed. The date shown is not the original due date.    Objective:   Vitals:   04/07/24 1355  BP: 116/79  Pulse: 89  Resp: 16  SpO2: 100%  Weight: 171 lb 3.2 oz (77.7 kg)   BP Readings from Last 3 Encounters:  04/07/24 116/79  03/23/24 138/79  01/14/24 130/89   Physical Exam Vitals reviewed.  Constitutional:      Appearance: Normal appearance. She is obese.  HENT:     Head: Normocephalic.     Right Ear: External ear normal. Tympanic membrane is erythematous.     Left Ear: External ear normal. Tympanic membrane is erythematous.     Ears:     Comments: Q-tip    Nose: Congestion present.     Mouth/Throat:  Mouth: Mucous membranes are moist.     Pharynx: Posterior oropharyngeal erythema present.     Comments: Boggy swollen left > right Eyes:     Extraocular Movements: Extraocular movements intact.     Pupils: Pupils are equal, round, and reactive to light.  Cardiovascular:     Rate and Rhythm: Normal rate and regular rhythm.  Pulmonary:     Effort: Pulmonary effort is normal.     Breath sounds: Normal breath sounds.  Abdominal:     General: Bowel sounds are normal.     Palpations: Abdomen is soft.  Musculoskeletal:        General: Normal range of motion.     Cervical back: Normal range of motion.  Skin:    General: Skin is warm and dry.  Neurological:     Mental Status: She is alert and oriented to person, place, and time.  Psychiatric:        Mood and Affect: Mood normal.        Behavior:  Behavior normal.        Thought Content: Thought content normal.   Lauren Conway was seen today for sore throat.  Diagnoses and all orders for this visit:  Nasal congestion 2/2 Seasonal allergies  -     fluticasone  (FLONASE ) 50 MCG/ACT nasal spray; Place 2 sprays into both nostrils daily. -     loratadine  (CLARITIN ) 10 MG tablet; Take 1 tablet (10 mg total) by mouth daily.  Screen for STD (sexually transmitted disease) -     HIV Antibody (routine testing w rflx)     Assessment & Plan   Keep f/u appt  The patient was given clear instructions to go to ER or return to medical center if symptoms don't improve, worsen or new problems develop. The patient verbalized understanding. The patient was told to call to get lab results if they haven't heard anything in the next week.   This note has been created with Education officer, environmental. Any transcriptional errors are unintentional.   Marius Siemens, NP 04/07/2024, 2:00 PM

## 2024-04-08 LAB — HIV ANTIBODY (ROUTINE TESTING W REFLEX): HIV Screen 4th Generation wRfx: NONREACTIVE

## 2024-04-10 ENCOUNTER — Encounter (INDEPENDENT_AMBULATORY_CARE_PROVIDER_SITE_OTHER): Payer: Self-pay | Admitting: Primary Care

## 2024-04-10 DIAGNOSIS — J0110 Acute frontal sinusitis, unspecified: Secondary | ICD-10-CM

## 2024-04-14 MED ORDER — AMOXICILLIN 500 MG PO CAPS
500.0000 mg | ORAL_CAPSULE | Freq: Three times a day (TID) | ORAL | 0 refills | Status: AC
Start: 2024-04-14 — End: 2024-04-21

## 2024-04-14 MED ORDER — FLUCONAZOLE 150 MG PO TABS: 150.0000 mg | ORAL_TABLET | Freq: Every day | ORAL | 1 refills | Status: DC

## 2024-05-05 ENCOUNTER — Ambulatory Visit: Admitting: Obstetrics

## 2024-06-17 ENCOUNTER — Ambulatory Visit

## 2024-06-17 ENCOUNTER — Other Ambulatory Visit (HOSPITAL_COMMUNITY)
Admission: RE | Admit: 2024-06-17 | Discharge: 2024-06-17 | Disposition: A | Discharge status: Home / Self Care | Payer: PRIVATE HEALTH INSURANCE | Source: Ambulatory Visit | Attending: Obstetrics | Admitting: Obstetrics

## 2024-06-17 ENCOUNTER — Ambulatory Visit: Payer: Self-pay | Admitting: Obstetrics and Gynecology

## 2024-06-17 VITALS — BP 124/76 | Wt 175.0 lb

## 2024-06-17 DIAGNOSIS — Z3202 Encounter for pregnancy test, result negative: Secondary | ICD-10-CM

## 2024-06-17 DIAGNOSIS — R102 Pelvic and perineal pain: Secondary | ICD-10-CM

## 2024-06-17 DIAGNOSIS — Z113 Encounter for screening for infections with a predominantly sexual mode of transmission: Secondary | ICD-10-CM

## 2024-06-17 LAB — POCT URINALYSIS DIPSTICK
Bilirubin, UA: NEGATIVE
Blood, UA: NEGATIVE
Glucose, UA: NEGATIVE
Leukocytes, UA: NEGATIVE
Nitrite, UA: NEGATIVE
Protein, UA: NEGATIVE
Spec Grav, UA: 1.02 (ref 1.010–1.025)
Urobilinogen, UA: 0.2 U/dL
pH, UA: 6.5 (ref 5.0–8.0)

## 2024-06-17 LAB — POCT URINE PREGNANCY: Preg Test, Ur: NEGATIVE

## 2024-06-17 NOTE — Progress Notes (Signed)
 SUBJECTIVE:  33 y.o. female who desires a STI screen. Denies abnormal vaginal discharge, bleeding or significant pelvic pain. No UTI symptoms. Denies history of known exposure to STD.  No LMP recorded.  OBJECTIVE:  She appears well.   ASSESSMENT:  STI Screen   PLAN:  Pt offered STI blood screening-requested GC, chlamydia, and trichomonas probe sent to lab.  Treatment: To be determined once lab results are received.  Pt follow up as needed.  Aleatha Mishra here for a UPT. Pt not done a upt at home. LMP is 05/28/24.  Pt would like UPT today to make sure.   UPT in office Negative.   SUBJECTIVE: Mesa Naumann is a 33 y.o. female who complains of urinary frequency, urgency and dysuria x 4 days, without flank pain, fever, chills, or abnormal vaginal discharge or bleeding.   OBJECTIVE: Appears well, in no apparent distress.  Vital signs are normal. Urine dipstick shows positive for ketones.    ASSESSMENT: Dysuria  PLAN: Treatment per orders.  Call or return to clinic prn if these symptoms worsen or fail to improve as anticipated.

## 2024-06-18 LAB — RPR: RPR Ser Ql: NONREACTIVE

## 2024-06-18 LAB — HEPATITIS C ANTIBODY: Hep C Virus Ab: NONREACTIVE

## 2024-06-18 LAB — HIV ANTIBODY (ROUTINE TESTING W REFLEX): HIV Screen 4th Generation wRfx: NONREACTIVE

## 2024-06-18 LAB — HEPATITIS B SURFACE ANTIGEN: Hepatitis B Surface Ag: NEGATIVE

## 2024-06-21 LAB — CERVICOVAGINAL ANCILLARY ONLY
Bacterial Vaginitis (gardnerella): NEGATIVE
Candida Glabrata: NEGATIVE
Candida Vaginitis: NEGATIVE
Chlamydia: NEGATIVE
Comment: NEGATIVE
Comment: NEGATIVE
Comment: NEGATIVE
Comment: NEGATIVE
Comment: NEGATIVE
Comment: NORMAL
Neisseria Gonorrhea: NEGATIVE
Trichomonas: NEGATIVE

## 2024-06-23 LAB — URINE CULTURE

## 2024-07-02 ENCOUNTER — Encounter: Payer: Self-pay | Admitting: Advanced Practice Midwife

## 2024-07-02 ENCOUNTER — Other Ambulatory Visit (HOSPITAL_COMMUNITY)
Admission: RE | Admit: 2024-07-02 | Discharge: 2024-07-02 | Disposition: A | Discharge status: Home / Self Care | Payer: PRIVATE HEALTH INSURANCE | Source: Ambulatory Visit | Attending: Obstetrics | Admitting: Obstetrics

## 2024-07-02 ENCOUNTER — Ambulatory Visit: Admitting: Obstetrics

## 2024-07-02 ENCOUNTER — Encounter: Payer: Self-pay | Admitting: Obstetrics

## 2024-07-02 VITALS — BP 131/86 | HR 72 | Ht 63.0 in | Wt 176.0 lb

## 2024-07-02 DIAGNOSIS — N946 Dysmenorrhea, unspecified: Secondary | ICD-10-CM | POA: Diagnosis not present

## 2024-07-02 DIAGNOSIS — Z113 Encounter for screening for infections with a predominantly sexual mode of transmission: Secondary | ICD-10-CM

## 2024-07-02 DIAGNOSIS — M62838 Other muscle spasm: Secondary | ICD-10-CM

## 2024-07-02 DIAGNOSIS — Z01419 Encounter for gynecological examination (general) (routine) without abnormal findings: Secondary | ICD-10-CM | POA: Diagnosis present

## 2024-07-02 DIAGNOSIS — N898 Other specified noninflammatory disorders of vagina: Secondary | ICD-10-CM | POA: Insufficient documentation

## 2024-07-02 DIAGNOSIS — N393 Stress incontinence (female) (male): Secondary | ICD-10-CM

## 2024-07-02 LAB — POCT URINALYSIS DIPSTICK
Bilirubin, UA: NEGATIVE
Glucose, UA: NEGATIVE
Ketones, UA: NEGATIVE
Leukocytes, UA: NEGATIVE
Nitrite, UA: NEGATIVE
Protein, UA: POSITIVE — AB
Spec Grav, UA: 1.015
Urobilinogen, UA: 0.2 U/dL
pH, UA: 6

## 2024-07-02 MED ORDER — IBUPROFEN 800 MG PO TABS: 800.0000 mg | ORAL_TABLET | Freq: Three times a day (TID) | ORAL | 5 refills | Status: DC | PRN

## 2024-07-02 MED ORDER — CYCLOBENZAPRINE HCL 10 MG PO TABS: 10.0000 mg | ORAL_TABLET | Freq: Three times a day (TID) | ORAL | 1 refills | Status: AC | PRN

## 2024-07-02 NOTE — Progress Notes (Signed)
 C/O vulvar/vaginal itching. Discharge clear no odor. Off and on X1week.  Wants STI testing both swab and blood work. When sneezes will urinate.

## 2024-07-02 NOTE — Progress Notes (Signed)
 Subjective:       Lauren Conway is a 33 y.o. female here for a routine exam.  Current complaints: . Vaginal discharge and irritation.  Leaking of urine with cough, sneeze, etc.   Personal health questionnaire:  Is patient Ashkenazi Jewish, have a family history of breast and/or ovarian cancer: yes Is there a family history of uterine cancer diagnosed at age < 43, gastrointestinal cancer, urinary tract cancer, family member who is a Personnel officer syndrome-associated carrier: no Is the patient overweight and hypertensive, family history of diabetes, personal history of gestational diabetes, preeclampsia or PCOS: no Is patient over 57, have PCOS,  family history of premature CHD under age 21, diabetes, smoke, have hypertension or peripheral artery disease:  no At any time, has a partner hit, kicked or otherwise hurt or frightened you?: no Over the past 2 weeks, have you felt down, depressed or hopeless?: yes Over the past 2 weeks, have you felt little interest or pleasure in doing things?:yes   Gynecologic History Patient's last menstrual period was 06/23/2024 (exact date). Contraception: tubal ligation Last Pap: 2023. Results were: normal Last mammogram: n/a. Results were: n/a  Obstetric History OB History  Gravida Para Term Preterm AB Living  6 4 3 1 2 3   SAB IAB Ectopic Multiple Live Births  2 0 0 0 3    # Outcome Date GA Lbr Len/2nd Weight Sex Type Anes PTL Lv  6 Term 10/28/17 [redacted]w[redacted]d   F Vag-Spont   LIV  5 SAB 01/2017 [redacted]w[redacted]d            Birth Comments: no complication  4 Term 08/09/15 [redacted]w[redacted]d 51:15 / 00:18 7 lb 11.6 oz (3.504 kg) M Vag-Spont EPI  LIV  3 Term 03/19/13 [redacted]w[redacted]d 14:40 / 00:34 6 lb 5.6 oz (2.88 kg) F Vag-Spont EPI  LIV     Birth Comments: umbilical hernia  2 SAB 2013 [redacted]w[redacted]d         1 Preterm             Past Medical History:  Diagnosis Date   Anemia    Anxiety    Asthma    uses inhaler, last used 8/22   Depression    Eczema    History of chlamydia    12/2008;  03/09/2009   History of gonorrhea    12/2008; 09/2010;    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   PID (pelvic inflammatory disease)     Past Surgical History:  Procedure Laterality Date   TUBAL LIGATION N/A 10/29/2017   Procedure: POST PARTUM TUBAL LIGATION;  Surgeon: Nicholaus Burnard HERO, MD;  Location: [facility];  Service: Gynecology;  Laterality: N/A;     Current Outpatient Medications:    albuterol  (VENTOLIN  HFA) 108 (90 Base) MCG/ACT inhaler, Inhale 2 puffs into the lungs every 4 (four) hours as needed for wheezing or shortness of breath., Disp: 6.7 each, Rfl: 1   cyclobenzaprine  (FLEXERIL ) 10 MG tablet, Take 1 tablet (10 mg total) by mouth every 8 (eight) hours as needed for muscle spasms., Disp: 30 tablet, Rfl: 1   fluticasone  (FLONASE ) 50 MCG/ACT nasal spray, Place 2 sprays into both nostrils daily., Disp: 16 g, Rfl: 6   loratadine  (CLARITIN ) 10 MG tablet, Take 1 tablet (10 mg total) by mouth daily., Disp: 30 tablet, Rfl: 11   metroNIDAZOLE  (METROGEL ) 0.75 % vaginal gel, Place 1 Applicatorful vaginally at bedtime. Apply one applicatorful to vagina at bedtime for 5 days, Disp: 70  g, Rfl: 1   valACYclovir  (VALTREX ) 1000 MG tablet, Take 1 tablet (1,000 mg total) by mouth 2 (two) times daily. For 5 days prn each outbreak, Disp: 30 tablet, Rfl: 11   fluconazole  (DIFLUCAN ) 150 MG tablet, Take 1 tablet (150 mg total) by mouth daily. (Patient not taking: Reported on 07/02/2024), Disp: 1 tablet, Rfl: 1   ibuprofen  (ADVIL ) 800 MG tablet, Take 1 tablet (800 mg total) by mouth every 8 (eight) hours as needed., Disp: 30 tablet, Rfl: 5 Allergies  Allergen Reactions   Egg-Derived Products Hives and Swelling   Latex Itching and Swelling   Pecan Nut (Diagnostic) Hives    Social History   Tobacco Use   Smoking status: Former    Current packs/day: 0.00    Types: Cigarettes    Quit date: 04/16/2022    Years since quitting: 2.2    Passive exposure: Never   Smokeless tobacco:  Never  Substance Use Topics   Alcohol use: Yes    Alcohol/week: 0.0 standard drinks of alcohol    Comment: occassionally    Family History  Problem Relation Age of Onset   Hypertension Mother    Depression Mother    Asthma Maternal Grandmother    Breast cancer Paternal Grandmother        30s   Diabetes Paternal Grandmother    Other Neg Hx       Review of Systems  Constitutional: negative for fatigue and weight loss Respiratory: negative for cough and wheezing Cardiovascular: negative for chest pain, fatigue and palpitations Gastrointestinal: negative for abdominal pain and change in bowel habits Musculoskeletal: positive for myalgias, c/o muscle spasms in upper back. Neurological: negative for gait problems and tremors Behavioral/Psych: negative for abusive relationship, depression Endocrine: negative for temperature intolerance    Genitourinary: positive for vaginal discharge and irritation; and leaking of urine with cough, sneeze, etc.  Also   negative for abnormal menstrual periods, genital lesions, hot flashes, sexual problems  Integument/breast: negative for breast lump, breast tenderness, nipple discharge and skin lesion(s)    Objective:       BP 131/86   Pulse 72   Ht 5' 3 (1.6 m)   Wt 176 lb (79.8 kg)   LMP 06/23/2024 (Exact Date)   BMI 31.18 kg/m  General:   Alert and no distress  Skin:   no rash or abnormalities  Lungs:   clear to auscultation bilaterally  Heart:   regular rate and rhythm, S1, S2 normal, no murmur, click, rub or gallop  Breasts:   normal without suspicious masses, skin or nipple changes or axillary nodes  Abdomen:  normal findings: no organomegaly, soft, non-tender and no hernia  Pelvis:  External genitalia: normal general appearance Urinary system: urethral meatus normal and bladder without fullness, nontender Vaginal: normal without tenderness, induration or masses Cervix: normal appearance Adnexa: normal bimanual exam Uterus:  anteverted and non-tender, normal size   Lab Review Urine pregnancy test Labs reviewed yes Radiologic studies reviewed no  I have spent a total of 20 minutes of face-to-face time, excluding clinical staff time, reviewing notes and preparing to see patient, ordering tests and/or medications, and counseling the patient.   Assessment:    1. Encounter for routine gynecological examination with Papanicolaou smear of cervix Rx: - Cytology - PAP( [place]) - POCT Urinalysis Dipstick  2. Dysmenorrhea Rx: - ibuprofen  (ADVIL ) 800 MG tablet; Take 1 tablet (800 mg total) by mouth every 8 (eight) hours as needed.  Dispense: 30 tablet; Refill: 5  3. Vaginal discharge Rx: - Cervicovaginal ancillary only( [place])  4. Screening for STD (sexually transmitted disease) Rx: - Hepatitis B surface antigen - Hepatitis C Antibody - RPR - HIV antibody (with reflex)  5. SUI (stress urinary incontinence, female) (Primary) Rx: - Ambulatory referral to Urogynecology - Urine Culture  6. Muscle spasm Rx: - cyclobenzaprine  (FLEXERIL ) 10 MG tablet; Take 1 tablet (10 mg total) by mouth every 8 (eight) hours as needed for muscle spasms.  Dispense: 30 tablet; Refill: 1     Plan:    Education reviewed: calcium supplements, depression evaluation, low fat, low cholesterol diet, safe sex/STD prevention, self breast exams, and weight bearing exercise. Follow up in: 1 year.   Meds ordered this encounter  Medications   ibuprofen  (ADVIL ) 800 MG tablet    Sig: Take 1 tablet (800 mg total) by mouth every 8 (eight) hours as needed.    Dispense:  30 tablet    Refill:  5   cyclobenzaprine  (FLEXERIL ) 10 MG tablet    Sig: Take 1 tablet (10 mg total) by mouth every 8 (eight) hours as needed for muscle spasms.    Dispense:  30 tablet    Refill:  1   Orders Placed This Encounter  Procedures   Urine Culture   Hepatitis B surface antigen   Hepatitis C Antibody   RPR   HIV antibody (with reflex)    Ambulatory referral to Urogynecology    Referral Priority:   Routine    Referral Type:   Consultation    Referral Reason:   Specialty Services Required    Requested Specialty:   Urology    Number of Visits Requested:   1   POCT Urinalysis Dipstick    CARLIN RONAL CENTERS, MD, FACOG Attending Obstetrician & Gynecologist, [facility] for [facility], [facility], [place] 07/02/2024

## 2024-07-03 LAB — RPR: RPR Ser Ql: NONREACTIVE

## 2024-07-03 LAB — HEPATITIS C ANTIBODY: Hep C Virus Ab: NONREACTIVE

## 2024-07-03 LAB — HEPATITIS B SURFACE ANTIGEN: Hepatitis B Surface Ag: NEGATIVE

## 2024-07-03 LAB — HIV ANTIBODY (ROUTINE TESTING W REFLEX): HIV Screen 4th Generation wRfx: NONREACTIVE

## 2024-07-05 LAB — CERVICOVAGINAL ANCILLARY ONLY
Bacterial Vaginitis (gardnerella): NEGATIVE
Candida Glabrata: NEGATIVE
Candida Vaginitis: NEGATIVE
Chlamydia: NEGATIVE
Comment: NEGATIVE
Comment: NEGATIVE
Comment: NEGATIVE
Comment: NEGATIVE
Comment: NEGATIVE
Comment: NORMAL
Neisseria Gonorrhea: NEGATIVE
Trichomonas: NEGATIVE

## 2024-07-07 LAB — CYTOLOGY - PAP
Comment: NEGATIVE
Diagnosis: NEGATIVE
High risk HPV: NEGATIVE

## 2024-07-07 LAB — URINE CULTURE

## 2024-07-08 ENCOUNTER — Ambulatory Visit: Payer: Self-pay | Admitting: Family Medicine

## 2024-07-27 ENCOUNTER — Telehealth (INDEPENDENT_AMBULATORY_CARE_PROVIDER_SITE_OTHER): Payer: Self-pay | Admitting: Primary Care

## 2024-07-27 NOTE — Telephone Encounter (Signed)
 Called pt to confirm appt. Pt will be present.

## 2024-07-28 ENCOUNTER — Encounter (INDEPENDENT_AMBULATORY_CARE_PROVIDER_SITE_OTHER): Payer: Self-pay | Admitting: Primary Care

## 2024-07-28 ENCOUNTER — Ambulatory Visit (INDEPENDENT_AMBULATORY_CARE_PROVIDER_SITE_OTHER): Payer: PRIVATE HEALTH INSURANCE | Admitting: Primary Care

## 2024-07-28 VITALS — BP 126/80 | HR 78 | Resp 20 | Ht 63.0 in | Wt 182.4 lb

## 2024-07-28 DIAGNOSIS — Z13220 Encounter for screening for lipoid disorders: Secondary | ICD-10-CM

## 2024-07-28 DIAGNOSIS — E66811 Obesity, class 1: Secondary | ICD-10-CM | POA: Diagnosis not present

## 2024-07-28 DIAGNOSIS — Z0000 Encounter for general adult medical examination without abnormal findings: Secondary | ICD-10-CM | POA: Diagnosis not present

## 2024-07-28 DIAGNOSIS — Z6832 Body mass index (BMI) 32.0-32.9, adult: Secondary | ICD-10-CM

## 2024-07-28 DIAGNOSIS — E6609 Other obesity due to excess calories: Secondary | ICD-10-CM

## 2024-07-28 DIAGNOSIS — N921 Excessive and frequent menstruation with irregular cycle: Secondary | ICD-10-CM

## 2024-07-28 NOTE — Progress Notes (Signed)
 [facility]  Lauren Conway is a 33 y.o. female presents to office today for annual physical exam examination.    Concerns today include: 1. no  Occupation: Community education officer, Marital status: S, Substance use: N Diet: N, Exercise: Y  Health Maintenance  Topic Date Due   COVID-19 Vaccine (1 - 2024-25 season) Never done   Pneumococcal Vaccine: 19-49 Years (1 of 2 - PCV) 01/13/2025 (Originally 02/19/2011)   Hepatitis B Vaccines (1 of 3 - 19+ 3-dose series) 07/28/2025 (Originally 02/19/2011)   HPV VACCINES (1 - 3-dose SCDM series) 07/28/2025 (Originally 02/19/2019)   Cervical Cancer Screening (Pap smear)  07/02/2025   DTaP/Tdap/Td (2 - Td or Tdap) 10/04/2027   Hepatitis C Screening  Completed   HIV Screening  Completed   Meningococcal B Vaccine  Aged Out   INFLUENZA VACCINE  Discontinued     Past Medical History:  Diagnosis Date   Allergy    Anemia    Anxiety    Asthma    uses inhaler, last used 8/22   Depression    Eczema    History of chlamydia    12/2008; 03/09/2009   History of gonorrhea    12/2008; 09/2010;    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   PID (pelvic inflammatory disease)    Social History   Socioeconomic History   Marital status: Single    Spouse name: Not on file   Number of children: 3   Years of education: Not on file   Highest education level: Associate degree: occupational, Scientist, product/process development, or vocational program  Occupational History   Not on file  Tobacco Use   Smoking status: Former    Current packs/day: 0.00    Types: Cigarettes    Quit date: 04/16/2022    Years since quitting: 2.2    Passive exposure: Never   Smokeless tobacco: Never  Vaping Use   Vaping status: Never Used  Substance and Sexual Activity   Alcohol use: Yes    Comment: occassionally   Drug use: Not Currently    Types: Marijuana    Comment: no more   Sexual activity: Yes    Partners: Male    Birth control/protection: Condom, Surgical  Other Topics  Concern   Not on file  Social History Narrative   Not on file   Social Drivers of Health   Financial Resource Strain: Patient Declined (07/28/2024)   Overall Financial Resource Strain (CARDIA)    Difficulty of Paying Living Expenses: Patient declined  Food Insecurity: Patient Declined (07/28/2024)   Hunger Vital Sign    Worried About Running Out of Food in the Last Year: Patient declined    Ran Out of Food in the Last Year: Patient declined  Transportation Needs: No Transportation Needs (07/28/2024)   PRAPARE - Administrator, Civil Service (Medical): No    Lack of Transportation (Non-Medical): No  Physical Activity: Insufficiently Active (07/28/2024)   Exercise Vital Sign    Days of Exercise per Week: 3 days    Minutes of Exercise per Session: 30 min  Stress: No Stress Concern Present (07/28/2024)   [employer] of Occupational Health - Occupational Stress Questionnaire    Feeling of Stress: Only a little  Social Connections: Socially Isolated (07/28/2024)   Social Connection and Isolation Panel    Frequency of Communication with Friends and Family: More than three times a week    Frequency of Social Gatherings with Friends and Family: More than three times  a week    Attends Religious Services: Patient declined    Active Member of Clubs or Organizations: No    Attends Banker Meetings: Not on file    Marital Status: Never married  Intimate Partner Violence: Not At Risk (07/28/2024)   Humiliation, Afraid, Rape, and Kick questionnaire    Fear of Current or Ex-Partner: No    Emotionally Abused: No    Physically Abused: No    Sexually Abused: No   Past Surgical History:  Procedure Laterality Date   TUBAL LIGATION N/A 10/29/2017   Procedure: POST PARTUM TUBAL LIGATION;  Surgeon: Nicholaus Burnard HERO, MD;  Location: [facility];  Service: Gynecology;  Laterality: N/A;   Family History  Problem Relation Age of Onset   Hypertension Mother    Depression  Mother    Asthma Maternal Grandmother    Breast cancer Paternal Grandmother        3s   Diabetes Paternal Grandmother    Other Neg Hx     Current Outpatient Medications:    albuterol  (VENTOLIN  HFA) 108 (90 Base) MCG/ACT inhaler, Inhale 2 puffs into the lungs every 4 (four) hours as needed for wheezing or shortness of breath., Disp: 6.7 each, Rfl: 1   cyclobenzaprine  (FLEXERIL ) 10 MG tablet, Take 1 tablet (10 mg total) by mouth every 8 (eight) hours as needed for muscle spasms., Disp: 30 tablet, Rfl: 1   fluticasone  (FLONASE ) 50 MCG/ACT nasal spray, Place 2 sprays into both nostrils daily., Disp: 16 g, Rfl: 6   ibuprofen  (ADVIL ) 800 MG tablet, Take 1 tablet (800 mg total) by mouth every 8 (eight) hours as needed., Disp: 30 tablet, Rfl: 5   loratadine  (CLARITIN ) 10 MG tablet, Take 1 tablet (10 mg total) by mouth daily., Disp: 30 tablet, Rfl: 11   valACYclovir  (VALTREX ) 1000 MG tablet, Take 1 tablet (1,000 mg total) by mouth 2 (two) times daily. For 5 days prn each outbreak, Disp: 30 tablet, Rfl: 11   fluconazole  (DIFLUCAN ) 150 MG tablet, Take 1 tablet (150 mg total) by mouth daily. (Patient not taking: Reported on 07/28/2024), Disp: 1 tablet, Rfl: 1   metroNIDAZOLE  (METROGEL ) 0.75 % vaginal gel, Place 1 Applicatorful vaginally at bedtime. Apply one applicatorful to vagina at bedtime for 5 days (Patient not taking: Reported on 07/28/2024), Disp: 70 g, Rfl: 1 Outpatient Encounter Medications as of 07/28/2024  Medication Sig Note   albuterol  (VENTOLIN  HFA) 108 (90 Base) MCG/ACT inhaler Inhale 2 puffs into the lungs every 4 (four) hours as needed for wheezing or shortness of breath.    cyclobenzaprine  (FLEXERIL ) 10 MG tablet Take 1 tablet (10 mg total) by mouth every 8 (eight) hours as needed for muscle spasms.    fluticasone  (FLONASE ) 50 MCG/ACT nasal spray Place 2 sprays into both nostrils daily.    ibuprofen  (ADVIL ) 800 MG tablet Take 1 tablet (800 mg total) by mouth every 8 (eight) hours as needed.     loratadine  (CLARITIN ) 10 MG tablet Take 1 tablet (10 mg total) by mouth daily.    valACYclovir  (VALTREX ) 1000 MG tablet Take 1 tablet (1,000 mg total) by mouth 2 (two) times daily. For 5 days prn each outbreak 07/28/2024: PRN   fluconazole  (DIFLUCAN ) 150 MG tablet Take 1 tablet (150 mg total) by mouth daily. (Patient not taking: Reported on 07/28/2024)    metroNIDAZOLE  (METROGEL ) 0.75 % vaginal gel Place 1 Applicatorful vaginally at bedtime. Apply one applicatorful to vagina at bedtime for 5 days (Patient not taking: Reported on  07/28/2024)    No facility-administered encounter medications on file as of 07/28/2024.    Allergies  Allergen Reactions   Egg-Derived Products Hives and Swelling   Latex Itching and Swelling   Pecan Nut (Diagnostic) Hives     ROS: Review of Systems A comprehensive review of systems was negative.    Physical exam General: Vital signs reviewed.  Patient is well-developed and well-nourished, obese in no acute distress and cooperative with exam. Head: Normocephalic and atraumatic. Eyes: EOMI, conjunctivae normal, no scleral icterus. Neck: Supple, trachea midline, normal ROM, no JVD, masses, thyromegaly, or carotid bruit present. Cardiovascular: RRR, S1 normal, S2 normal, no murmurs, gallops, or rubs. Pulmonary/Chest: Clear to auscultation bilaterally, no wheezes, rales, or rhonchi. Abdominal: Soft, non-tender, non-distended, BS +, no masses, organomegaly, or guarding present. Musculoskeletal: No joint deformities, erythema, or stiffness, ROM full and nontender. Extremities: No lower extremity edema bilaterally,  pulses symmetric and intact bilaterally. No cyanosis or clubbing. Neurological: A&O x3, Strength is normal Skin: Warm, dry and intact. No rashes or erythema. Psychiatric: Normal mood and affect. speech and behavior is normal. Cognition and memory are normal.      Assessment/ Plan: Lauren Conway here for annual physical exam.  Lauren Conway was seen  today for annual exam.  Diagnoses and all orders for this visit:  Encounter for annual wellness visit  Class 1 obesity due to excess calories without serious comorbidity with body mass index (BMI) of 32.0 to 32.9 in adult Obesity is 30-39 indicating an excess in caloric intake or underlining conditions. This may lead to other co-morbidities. Educated on lifestyle modifications of diet and exercise which may reduce obesity.    Menorrhagia with irregular cycle Painful regular cycles  CBC   Counseled on healthy lifestyle choices, including diet (rich in fruits, vegetables and lean meats and low in salt and simple carbohydrates) and exercise (at least 30 minutes of moderate physical activity daily).  Patient to follow up in 1 year for annual exam or sooner if needed.  The above assessment and management plan was discussed with the patient. The patient verbalized understanding of and has agreed to the management plan. Patient is aware to call the clinic if symptoms persist or worsen. Patient is aware when to return to the clinic for a follow-up visit. Patient educated on when it is appropriate to go to the emergency department.   This note has been created with Education officer, environmental. Any transcriptional errors are unintentional.   Lauren SHAUNNA Bohr, NP 07/28/2024, 3:04 PM

## 2024-07-30 ENCOUNTER — Ambulatory Visit (INDEPENDENT_AMBULATORY_CARE_PROVIDER_SITE_OTHER): Payer: Self-pay | Admitting: Primary Care

## 2024-07-30 DIAGNOSIS — E782 Mixed hyperlipidemia: Secondary | ICD-10-CM

## 2024-07-30 LAB — CBC WITH DIFFERENTIAL/PLATELET
Basophils Absolute: 0.1 x10E3/uL (ref 0.0–0.2)
Basos: 1 %
EOS (ABSOLUTE): 0.1 x10E3/uL (ref 0.0–0.4)
Eos: 1 %
Hematocrit: 36 % (ref 34.0–46.6)
Hemoglobin: 11.1 g/dL (ref 11.1–15.9)
Immature Grans (Abs): 0 x10E3/uL (ref 0.0–0.1)
Immature Granulocytes: 0 %
Lymphocytes Absolute: 2.6 x10E3/uL (ref 0.7–3.1)
Lymphs: 34 %
MCH: 29.3 pg (ref 26.6–33.0)
MCHC: 30.8 g/dL — ABNORMAL LOW (ref 31.5–35.7)
MCV: 95 fL (ref 79–97)
Monocytes Absolute: 0.4 x10E3/uL (ref 0.1–0.9)
Monocytes: 5 %
Neutrophils Absolute: 4.6 x10E3/uL (ref 1.4–7.0)
Neutrophils: 59 %
Platelets: 351 x10E3/uL (ref 150–450)
RBC: 3.79 x10E6/uL (ref 3.77–5.28)
RDW: 13.5 % (ref 11.7–15.4)
WBC: 7.7 x10E3/uL (ref 3.4–10.8)

## 2024-07-30 LAB — LIPID PANEL
Chol/HDL Ratio: 4 ratio (ref 0.0–4.4)
Cholesterol, Total: 202 mg/dL — ABNORMAL HIGH (ref 100–199)
HDL: 51 mg/dL (ref >39)
LDL Chol Calc (NIH): 124 mg/dL — ABNORMAL HIGH (ref 0–99)
Triglycerides: 155 mg/dL — ABNORMAL HIGH (ref 0–149)
VLDL Cholesterol Cal: 27 mg/dL (ref 5–40)

## 2024-07-30 LAB — CMP14+EGFR
ALT: 25 IU/L (ref 0–32)
AST: 33 IU/L (ref 0–40)
Albumin: 4.3 g/dL (ref 3.9–4.9)
Alkaline Phosphatase: 87 IU/L (ref 44–121)
BUN/Creatinine Ratio: 9 (ref 9–23)
BUN: 7 mg/dL (ref 6–20)
Bilirubin Total: 0.2 mg/dL (ref 0.0–1.2)
CO2: 24 mmol/L (ref 20–29)
Calcium: 9.6 mg/dL (ref 8.7–10.2)
Chloride: 103 mmol/L (ref 96–106)
Creatinine, Ser: 0.76 mg/dL (ref 0.57–1.00)
Globulin, Total: 2.8 g/dL (ref 1.5–4.5)
Glucose: 83 mg/dL (ref 70–99)
Potassium: 4.1 mmol/L (ref 3.5–5.2)
Sodium: 140 mmol/L (ref 134–144)
Total Protein: 7.1 g/dL (ref 6.0–8.5)
eGFR: 107 mL/min/1.73 (ref >59)

## 2024-07-30 MED ORDER — ATORVASTATIN CALCIUM 20 MG PO TABS: 20.0000 mg | ORAL_TABLET | Freq: Every day | ORAL | 1 refills | Status: AC

## 2024-09-23 ENCOUNTER — Ambulatory Visit: Payer: Self-pay

## 2024-09-23 NOTE — Telephone Encounter (Signed)
 FYI Only or Action Required?: FYI only for provider.  Patient was last seen in primary care on 07/28/2024 by Celestia Rosaline SQUIBB, NP.  Called Nurse Triage reporting Anxiety.  Symptoms began several months ago.  Symptoms are: gradually worsening.  Triage Disposition: See PCP Within 2 Weeks  Patient/caregiver understands and will follow disposition?: Yes with modifications, first available with PCP scheduled per patient's request       Copied from CRM #[id number]. Topic: Clinical - Red Word Triage >> Sep 23, 2024 10:47 AM Larissa RAMAN wrote: [facility] that prompted transfer to Nurse Triage: anxiety        Reason for Disposition  [1] Symptoms of anxiety or panic attack AND [2] is a chronic symptom (recurrent or ongoing AND present > 4 weeks)  Answer Assessment - Initial Assessment Questions Patient reporting worsening anxiety. Has requested to only see her PCP. First available appointment scheduled and it has been added to the wait list.       1. CONCERN: Did anything happen that prompted you to call today?      Increased anxiety  2. ANXIETY SYMPTOMS: Can you describe how you (your loved one; patient) have been feeling? (e.g., tense, restless, panicky, anxious, keyed up, overwhelmed, sense of impending doom).      Nervous energy, anxious   3. ONSET: How long have you been feeling this way? (e.g., hours, days, weeks)     Several months, worse over the last month  4. SEVERITY: How would you rate the level of anxiety? (e.g., 0 - 10; or mild, moderate, severe).     Mild to moderate   5. FUNCTIONAL IMPAIRMENT: How have these feelings affected your ability to do daily activities? Have you had more difficulty than usual doing your normal daily activities? (e.g., getting better, same, worse; self-care, school, work, interactions)     More difficulty with daily functions  6. HISTORY: Have you felt this way before? Have you ever been diagnosed with an anxiety problem in the  past? (e.g., generalized anxiety disorder, panic attacks, PTSD). If Yes, ask: How was this problem treated? (e.g., medicines, counseling, etc.)     Not this bad  7. RISK OF HARM - SUICIDAL IDEATION: Do you ever have thoughts of hurting or killing yourself? If Yes, ask:  Do you have these feelings now? Do you have a plan on how you would do this?     No 9. THERAPIST: Do you have a counselor or therapist? If Yes, ask: What is their name?     Not currently  10. POTENTIAL TRIGGERS: Do you drink caffeinated beverages (e.g., coffee, colas, teas), and how much daily? Do you drink alcohol or use any drugs? Have you started any new medicines recently?       Life is getting more stressful  11. PATIENT SUPPORT: Who is with you now? Who do you live with? Do you have family or friends who you can talk to?        Family and friends  12. OTHER SYMPTOMS: Do you have any other symptoms? (e.g., feeling depressed, trouble concentrating, trouble sleeping, trouble breathing, palpitations or fast heartbeat, chest pain, sweating, nausea, or diarrhea)       No  Protocols used: Anxiety and Panic Attack-A-AH

## 2024-09-23 NOTE — Telephone Encounter (Signed)
Noted with thanks

## 2024-10-04 ENCOUNTER — Ambulatory Visit (INDEPENDENT_AMBULATORY_CARE_PROVIDER_SITE_OTHER): Payer: PRIVATE HEALTH INSURANCE | Admitting: Primary Care

## 2024-10-04 ENCOUNTER — Encounter (INDEPENDENT_AMBULATORY_CARE_PROVIDER_SITE_OTHER): Payer: Self-pay | Admitting: Primary Care

## 2024-10-04 VITALS — BP 124/81 | HR 79 | Resp 16 | Wt 178.0 lb

## 2024-10-04 DIAGNOSIS — R519 Headache, unspecified: Secondary | ICD-10-CM

## 2024-10-04 DIAGNOSIS — F32A Depression, unspecified: Secondary | ICD-10-CM

## 2024-10-04 DIAGNOSIS — F419 Anxiety disorder, unspecified: Secondary | ICD-10-CM

## 2024-10-04 DIAGNOSIS — F4310 Post-traumatic stress disorder, unspecified: Secondary | ICD-10-CM

## 2024-10-04 DIAGNOSIS — F5102 Adjustment insomnia: Secondary | ICD-10-CM | POA: Diagnosis not present

## 2024-10-04 MED ORDER — SERTRALINE HCL 25 MG PO TABS: 25.0000 mg | ORAL_TABLET | Freq: Every day | ORAL | 1 refills | Status: AC

## 2024-10-04 NOTE — Progress Notes (Signed)
 [facility]  Lauren Conway, is a 33 y.o. female  RDW:[id number]  FMW:[id number]  DOB - 07-Sep-1991  Chief Complaint  Patient presents with   Anxiety     Nervous energy, anxious   Several months, worse over the last month  More difficulty with daily functions   Form Completion    FMLA       Subjective:   Lauren Conway is a 33 y.o. female here today for a acute visit.  She has had a traumatic experience.  Prior to this she allow a female friend of 17 years use her address.  Not thinking about it maybe having packages delivered and needed a stable address.   September 17 she experienced the police kicking in her door and drawing AR-15s on her this is a due seize and search looking for paraphernalia.  None of this was found.  She has 3 children and is uncomfortable living in her apartment since this event happened.  Not sure if someone is watching her and her children.  She has been staying with her mother in [place] not comfortable in her own home.  This traumatic event has interfered with her working in which she just breaks down and cries she takes and becomes paranoid.  She has been experiencing paresthesia in her right arm headache, No chest pain, No abdominal pain - some Nausea, No Cough - shortness of breath.  The pertinent positives are due to stress-induced. Diagnoses include insomnia, paranoia, depression, and PTSD. She will be started on medication and referred to a therapist that will manage her medication and therapy.  Appointment begins October 29 at 10:30 AM.  No problems updated.  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 6.7 each 1   atorvastatin  (LIPITOR) 20 MG tablet Take 1 tablet (20 mg total) by mouth daily. 90 tablet 1   cyclobenzaprine  (FLEXERIL ) 10 MG tablet Take 1 tablet (10 mg total) by mouth every 8 (eight) hours as needed for muscle spasms. 30 tablet 1   fluconazole  (DIFLUCAN ) 150 MG tablet Take 1 tablet (150 mg total) by mouth daily. (Patient not taking: Reported on 07/28/2024) 1 tablet 1   fluticasone  (FLONASE ) 50 MCG/ACT nasal spray Place 2 sprays into both nostrils daily. 16 g 6   loratadine  (CLARITIN ) 10 MG tablet Take 1 tablet (10 mg total) by mouth daily. 30 tablet 11   metroNIDAZOLE  (METROGEL ) 0.75 % vaginal gel Place 1 Applicatorful vaginally at bedtime. Apply one applicatorful to vagina at bedtime for 5 days (Patient not taking: Reported on 07/28/2024) 70 g 1   valACYclovir  (VALTREX ) 1000 MG tablet Take  1 tablet (1,000 mg total) by mouth 2 (two) times daily. For 5 days prn each outbreak 30 tablet 11   No current facility-administered medications on file prior to visit.   Health Maintenance  Topic Date Due   COVID-19 Vaccine (1 - 2025-26 season) Never done   Pneumococcal Vaccine (1 of 2 - PCV) 01/13/2025*   Hepatitis B Vaccine (1 of 3 - 19+ 3-dose series) 07/28/2025*   HPV Vaccine (1 - 3-dose SCDM series) 07/28/2025*   Pap Smear  07/02/2025   DTaP/Tdap/Td vaccine (2 - Td or Tdap) 10/04/2027   Hepatitis C Screening  Completed   HIV Screening  Completed   Meningitis B Vaccine  Aged Out   Flu Shot  Discontinued  *Topic was postponed. The date shown is not the original due  date.    Objective:   Vitals:   10/04/24 1000  BP: 124/81  Pulse: 79  Resp: 16  SpO2: 100%  Weight: 178 lb (80.7 kg)   BP Readings from Last 3 Encounters:  10/04/24 124/81  07/28/24 126/80  07/02/24 131/86   Assessment & Plan  Lauren Conway was seen today for anxiety and form completion.  Diagnoses and all orders for this visit:  PTSD (post-traumatic stress disorder) -     sertraline  (ZOLOFT ) 25 MG tablet; Take 1 tablet (25 mg total) by mouth daily.  Bilateral headaches -     sertraline  (ZOLOFT ) 25 MG tablet; Take 1 tablet (25 mg total) by mouth daily.  Anxiety and depression -     sertraline  (ZOLOFT ) 25 MG tablet; Take 1 tablet (25 mg total) by mouth daily.   Adjustment insomnia   sertraline  (ZOLOFT ) 25 MG tablet; Take 1 tablet (25 mg total) by mouth daily.   Patient have been counseled extensively about nutrition and exercise. Other issues discussed during this visit include: low cholesterol diet, weight control and daily exercise, foot care, annual eye examinations at Ophthalmology, importance of adherence with medications and regular follow-up. We also discussed long term complications of uncontrolled diabetes and hypertension.   Return in about 4 weeks (around 11/01/2024).  The patient was given clear instructions to go to ER or return to medical center if symptoms don't improve, worsen or new problems develop. The patient verbalized understanding. The patient was told to call to get lab results if they haven't heard anything in the next week.   This note has been created with Education officer, environmental. Any transcriptional errors are unintentional.   Lauren SHAUNNA Bohr, NP 10/04/2024, 11:55 AM

## 2024-10-08 ENCOUNTER — Other Ambulatory Visit: Payer: PRIVATE HEALTH INSURANCE

## 2024-10-18 ENCOUNTER — Encounter: Payer: Self-pay | Admitting: Radiology

## 2024-10-19 ENCOUNTER — Ambulatory Visit (INDEPENDENT_AMBULATORY_CARE_PROVIDER_SITE_OTHER): Payer: PRIVATE HEALTH INSURANCE | Admitting: Primary Care

## 2024-10-20 ENCOUNTER — Ambulatory Visit: Payer: PRIVATE HEALTH INSURANCE | Admitting: Obstetrics

## 2024-10-28 ENCOUNTER — Ambulatory Visit (INDEPENDENT_AMBULATORY_CARE_PROVIDER_SITE_OTHER): Payer: PRIVATE HEALTH INSURANCE | Admitting: Primary Care

## 2024-10-28 VITALS — BP 132/81 | HR 95 | Resp 16 | Wt 175.4 lb

## 2024-10-28 DIAGNOSIS — F419 Anxiety disorder, unspecified: Secondary | ICD-10-CM

## 2024-10-28 DIAGNOSIS — E782 Mixed hyperlipidemia: Secondary | ICD-10-CM | POA: Diagnosis not present

## 2024-10-28 DIAGNOSIS — F32A Depression, unspecified: Secondary | ICD-10-CM

## 2024-10-28 NOTE — Progress Notes (Signed)
 [facility]  Lauren Conway, is a 33 y.o. female  RDW:[id number]  FMW:[id number]  DOB - 1991-11-18  Chief Complaint  Patient presents with   Hyperlipidemia       Subjective:   Lauren Conway is a 33 y.o. female here today for a follow up visit.  Management of hyperlipidemia and depression.  She was scheduled today to see the  clinical social worker with a improve PHQ and feeling a lot better today .  She denies suicidal thoughts ideations or auditory hallucinations.  She also denies increased anxiousness or agitation or being a burden on someone else.  She has had no extreme mood swings other than today she has been a lot happier than previous visits will monitor Hyperlipidemia    No problems updated.  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 6.7 each 1   atorvastatin  (LIPITOR) 20 MG tablet Take 1 tablet (20 mg total) by mouth daily. 90 tablet 1   cyclobenzaprine  (FLEXERIL ) 10 MG tablet Take 1 tablet (10 mg total) by mouth every 8 (eight) hours as needed for muscle spasms. 30 tablet 1   fluticasone  (FLONASE ) 50 MCG/ACT nasal spray Place 2 sprays into both nostrils daily. 16 g 6   loratadine  (CLARITIN ) 10 MG tablet  Take 1 tablet (10 mg total) by mouth daily. 30 tablet 11   sertraline  (ZOLOFT ) 25 MG tablet Take 1 tablet (25 mg total) by mouth daily. 90 tablet 1   valACYclovir  (VALTREX ) 1000 MG tablet Take 1 tablet (1,000 mg total) by mouth 2 (two) times daily. For 5 days prn each outbreak 30 tablet 11   No current facility-administered medications on file prior to visit.   Health Maintenance  Topic Date Due   COVID-19 Vaccine (1 - 2025-26 season) Never done   Pneumococcal Vaccine (1 of 2 - PCV) 01/13/2025*   Hepatitis B Vaccine (1 of 3 - 19+ 3-dose series) 07/28/2025*   HPV Vaccine (1 - 3-dose SCDM series) 07/28/2025*   Pap Smear  07/02/2025   DTaP/Tdap/Td vaccine (2 - Td or Tdap) 10/04/2027   Hepatitis C Screening  Completed   HIV Screening  Completed   Meningitis B Vaccine  Aged Out   Flu Shot  Discontinued  *Topic was postponed. The date shown is not the original due date.    Objective:   Vitals:   10/28/24 1020  BP: 132/81  Pulse: 95  Resp: 16  SpO2: 98%  Weight: 175 lb 6.4 oz (79.6 kg)   BP Readings from Last 3 Encounters:  10/28/24 132/81  10/04/24 124/81  07/28/24 126/80  Physical Exam Vitals reviewed.  Constitutional:      Appearance: Normal appearance. She is obese.  HENT:     Head: Normocephalic.     Right Ear: Tympanic membrane, ear canal and external ear normal.     Left Ear: Tympanic membrane, ear canal and external ear normal.     Nose: Nose normal.     Mouth/Throat:     Mouth: Mucous membranes are moist.  Eyes:     Extraocular Movements: Extraocular movements intact.     Pupils: Pupils are equal, round, and reactive to light.  Cardiovascular:     Rate and Rhythm: Normal rate.  Pulmonary:     Effort: Pulmonary effort is normal.     Breath sounds: Normal breath sounds.  Abdominal:     General: Bowel sounds are normal.     Palpations: Abdomen is soft.  Musculoskeletal:        General: Normal range of motion.     Cervical back: Normal range of motion.   Skin:    General: Skin is warm and dry.  Neurological:     Mental Status: She is alert and oriented to person, place, and time.  Psychiatric:        Mood and Affect: Mood normal.        Behavior: Behavior normal.        Thought Content: Thought content normal.      Assessment & Plan    Are you got is what weight Glyn was seen today for hyperlipidemia.  Diagnoses and all orders for this visit:  Mixed hyperlipidemia -     Lipid panel  Anxiety and depression See HPI    Patient have been counseled extensively about nutrition and exercise. Other issues discussed during this visit include: low cholesterol diet, weight control and daily exercise, foot care, annual eye examinations at Ophthalmology, importance of adherence with medications and regular follow-up. We also discussed long term complications of uncontrolled diabetes and hypertension.   Return in about 3 months (around 01/28/2025) for fasting labs.  The patient was given clear instructions to go to ER or return to medical center if symptoms don't improve, worsen or new problems develop. The patient verbalized understanding. The patient was told to call to get lab results if they haven't heard anything in the next week.   This note has been created with Education officer, environmental. Any transcriptional errors are unintentional.   Lauren SHAUNNA Bohr, NP 10/28/2024, 11:21 AM

## 2024-10-29 LAB — LIPID PANEL
Chol/HDL Ratio: 4.4 ratio (ref 0.0–4.4)
Cholesterol, Total: 233 mg/dL — ABNORMAL HIGH (ref 100–199)
HDL: 53 mg/dL (ref >39)
LDL Chol Calc (NIH): 156 mg/dL — ABNORMAL HIGH (ref 0–99)
Triglycerides: 135 mg/dL (ref 0–149)
VLDL Cholesterol Cal: 24 mg/dL (ref 5–40)

## 2024-11-04 ENCOUNTER — Ambulatory Visit (INDEPENDENT_AMBULATORY_CARE_PROVIDER_SITE_OTHER): Payer: Self-pay | Admitting: Primary Care

## 2024-11-08 NOTE — Telephone Encounter (Signed)
 Will forward to provider   I have printed form and placed in provider box

## 2024-12-27 ENCOUNTER — Encounter: Payer: Self-pay | Admitting: *Deleted

## 2024-12-30 ENCOUNTER — Ambulatory Visit: Payer: Self-pay

## 2024-12-30 ENCOUNTER — Other Ambulatory Visit (HOSPITAL_COMMUNITY)
Admission: RE | Admit: 2024-12-30 | Discharge: 2024-12-30 | Disposition: A | Discharge status: Home / Self Care | Source: Ambulatory Visit | Attending: Obstetrics and Gynecology | Admitting: Obstetrics and Gynecology

## 2024-12-30 VITALS — BP 130/76 | HR 83 | Ht 63.0 in | Wt 186.0 lb

## 2024-12-30 DIAGNOSIS — Z113 Encounter for screening for infections with a predominantly sexual mode of transmission: Secondary | ICD-10-CM

## 2024-12-30 DIAGNOSIS — N946 Dysmenorrhea, unspecified: Secondary | ICD-10-CM

## 2024-12-30 MED ORDER — IBUPROFEN 800 MG PO TABS: 800.0000 mg | ORAL_TABLET | Freq: Three times a day (TID) | ORAL | 1 refills | Status: AC | PRN

## 2024-12-30 NOTE — Progress Notes (Signed)
 SUBJECTIVE:  34 y.o. female who desires a STI screen. Denies abnormal vaginal discharge, bleeding or significant pelvic pain. No UTI symptoms. Denies history of known exposure to STD.  LMP 12/05/2024  OBJECTIVE:  She appears well.   ASSESSMENT:  STI Screen   PLAN:  Pt offered STI blood screening-requested and ordered GC, chlamydia, and trichomonas probe sent to lab.  Treatment: To be determined once lab results are received.  Pt follow up as needed.

## 2024-12-31 LAB — CERVICOVAGINAL ANCILLARY ONLY
Bacterial Vaginitis (gardnerella): NEGATIVE
Candida Glabrata: NEGATIVE
Candida Vaginitis: NEGATIVE
Chlamydia: NEGATIVE
Comment: NEGATIVE
Comment: NEGATIVE
Comment: NEGATIVE
Comment: NEGATIVE
Comment: NEGATIVE
Comment: NORMAL
Neisseria Gonorrhea: NEGATIVE
Trichomonas: NEGATIVE

## 2024-12-31 LAB — SYPHILIS: RPR W/REFLEX TO RPR TITER AND TREPONEMAL ANTIBODIES, TRADITIONAL SCREENING AND DIAGNOSIS ALGORITHM: RPR Ser Ql: NONREACTIVE

## 2024-12-31 LAB — HEPATITIS C ANTIBODY: Hep C Virus Ab: NONREACTIVE

## 2024-12-31 LAB — HIV ANTIBODY (ROUTINE TESTING W REFLEX): HIV Screen 4th Generation wRfx: NONREACTIVE

## 2024-12-31 LAB — HEPATITIS B SURFACE ANTIGEN: Hepatitis B Surface Ag: NEGATIVE

## 2025-01-03 ENCOUNTER — Ambulatory Visit: Payer: Self-pay | Admitting: Obstetrics and Gynecology

## 2025-01-20 ENCOUNTER — Other Ambulatory Visit (INDEPENDENT_AMBULATORY_CARE_PROVIDER_SITE_OTHER): Payer: Self-pay | Admitting: Primary Care

## 2025-01-20 DIAGNOSIS — J4520 Mild intermittent asthma, uncomplicated: Secondary | ICD-10-CM

## 2025-01-28 ENCOUNTER — Ambulatory Visit (INDEPENDENT_AMBULATORY_CARE_PROVIDER_SITE_OTHER): Payer: PRIVATE HEALTH INSURANCE | Admitting: Primary Care

## 2025-02-09 ENCOUNTER — Ambulatory Visit: Payer: PRIVATE HEALTH INSURANCE | Admitting: Obstetrics and Gynecology
